# Patient Record
Sex: Female | Born: 1950 | Race: White | Hispanic: No | Marital: Married | Smoking: Never smoker
Health system: Southern US, Community
[De-identification: ages and names within clinical notes are randomized; demographics above are authoritative.]

## PROBLEM LIST (undated history)

## (undated) DIAGNOSIS — I10 Essential (primary) hypertension: Secondary | ICD-10-CM

## (undated) DIAGNOSIS — I4819 Other persistent atrial fibrillation: Secondary | ICD-10-CM

## (undated) DIAGNOSIS — F410 Panic disorder [episodic paroxysmal anxiety] without agoraphobia: Secondary | ICD-10-CM

## (undated) DIAGNOSIS — T4145XA Adverse effect of unspecified anesthetic, initial encounter: Secondary | ICD-10-CM

## (undated) DIAGNOSIS — G43909 Migraine, unspecified, not intractable, without status migrainosus: Secondary | ICD-10-CM

## (undated) DIAGNOSIS — T8859XA Other complications of anesthesia, initial encounter: Secondary | ICD-10-CM

## (undated) DIAGNOSIS — Z8489 Family history of other specified conditions: Secondary | ICD-10-CM

## (undated) DIAGNOSIS — F419 Anxiety disorder, unspecified: Secondary | ICD-10-CM

## (undated) DIAGNOSIS — R0683 Snoring: Secondary | ICD-10-CM

## (undated) DIAGNOSIS — M1990 Unspecified osteoarthritis, unspecified site: Secondary | ICD-10-CM

## (undated) DIAGNOSIS — E785 Hyperlipidemia, unspecified: Secondary | ICD-10-CM

## (undated) DIAGNOSIS — I517 Cardiomegaly: Secondary | ICD-10-CM

## (undated) DIAGNOSIS — E0590 Thyrotoxicosis, unspecified without thyrotoxic crisis or storm: Secondary | ICD-10-CM

## (undated) DIAGNOSIS — T753XXA Motion sickness, initial encounter: Secondary | ICD-10-CM

## (undated) DIAGNOSIS — E669 Obesity, unspecified: Secondary | ICD-10-CM

## (undated) DIAGNOSIS — E039 Hypothyroidism, unspecified: Secondary | ICD-10-CM

## (undated) HISTORY — DX: Other persistent atrial fibrillation: I48.19

## (undated) HISTORY — DX: Hyperlipidemia, unspecified: E78.5

## (undated) HISTORY — DX: Obesity, unspecified: E66.9

## (undated) HISTORY — DX: Cardiomegaly: I51.7

## (undated) HISTORY — PX: ABDOMINAL HYSTERECTOMY: SHX81

## (undated) HISTORY — DX: Anxiety disorder, unspecified: F41.9

## (undated) HISTORY — PX: KNEE SURGERY: SHX244

## (undated) HISTORY — DX: Essential (primary) hypertension: I10

## (undated) HISTORY — PX: CATARACT EXTRACTION: SUR2

## (undated) HISTORY — DX: Snoring: R06.83

## (undated) HISTORY — DX: Thyrotoxicosis, unspecified without thyrotoxic crisis or storm: E05.90

## (undated) HISTORY — DX: Panic disorder (episodic paroxysmal anxiety): F41.0

---

## 2004-02-23 LAB — HM COLONOSCOPY: HM Colonoscopy: NORMAL

## 2004-12-14 ENCOUNTER — Ambulatory Visit: Payer: Self-pay | Admitting: Internal Medicine

## 2006-04-22 ENCOUNTER — Other Ambulatory Visit: Payer: Self-pay

## 2006-04-22 ENCOUNTER — Inpatient Hospital Stay: Payer: Self-pay | Admitting: Internal Medicine

## 2006-04-23 ENCOUNTER — Encounter: Payer: Self-pay | Admitting: Internal Medicine

## 2006-04-27 ENCOUNTER — Ambulatory Visit: Payer: Self-pay | Admitting: Internal Medicine

## 2006-08-18 ENCOUNTER — Ambulatory Visit: Payer: Self-pay | Admitting: Unknown Physician Specialty

## 2006-09-30 ENCOUNTER — Ambulatory Visit: Payer: Self-pay | Admitting: Surgery

## 2007-01-26 ENCOUNTER — Ambulatory Visit: Payer: Self-pay | Admitting: Family Medicine

## 2007-11-15 ENCOUNTER — Ambulatory Visit: Payer: Self-pay | Admitting: Orthopedic Surgery

## 2007-12-28 ENCOUNTER — Ambulatory Visit: Payer: Self-pay | Admitting: Orthopedic Surgery

## 2008-01-05 ENCOUNTER — Ambulatory Visit: Payer: Self-pay | Admitting: Orthopedic Surgery

## 2008-02-04 HISTORY — PX: OTHER SURGICAL HISTORY: SHX169

## 2008-03-06 DEATH — deceased

## 2008-06-15 ENCOUNTER — Ambulatory Visit: Payer: Self-pay | Admitting: Family Medicine

## 2008-08-16 ENCOUNTER — Emergency Department: Payer: Self-pay | Admitting: Emergency Medicine

## 2008-08-23 ENCOUNTER — Ambulatory Visit: Payer: Self-pay | Admitting: Unknown Physician Specialty

## 2008-08-25 ENCOUNTER — Ambulatory Visit: Payer: Self-pay | Admitting: Unknown Physician Specialty

## 2009-06-19 ENCOUNTER — Ambulatory Visit: Payer: Self-pay | Admitting: Internal Medicine

## 2009-10-05 ENCOUNTER — Encounter: Payer: Self-pay | Admitting: Internal Medicine

## 2009-10-10 ENCOUNTER — Encounter: Payer: Self-pay | Admitting: Internal Medicine

## 2009-11-13 ENCOUNTER — Encounter: Payer: Self-pay | Admitting: Internal Medicine

## 2009-11-13 ENCOUNTER — Ambulatory Visit: Payer: Self-pay | Admitting: Ophthalmology

## 2009-11-26 ENCOUNTER — Ambulatory Visit: Payer: Self-pay | Admitting: Ophthalmology

## 2009-12-28 ENCOUNTER — Ambulatory Visit: Payer: Self-pay | Admitting: Internal Medicine

## 2009-12-28 ENCOUNTER — Encounter: Payer: Self-pay | Admitting: Internal Medicine

## 2009-12-28 DIAGNOSIS — R002 Palpitations: Secondary | ICD-10-CM | POA: Insufficient documentation

## 2010-01-08 ENCOUNTER — Ambulatory Visit: Payer: Self-pay

## 2010-01-08 ENCOUNTER — Encounter: Payer: Self-pay | Admitting: Internal Medicine

## 2010-03-05 NOTE — Letter (Signed)
SummaryScientist, [facility]   [facility]   Imported By: Roderic Ovens 01/09/2010 10:45:40  _____________________________________________________________________  External Attachment:    Type:   Image     Comment:   External Document

## 2010-03-05 NOTE — Assessment & Plan Note (Signed)
Summary: NEW PT   Visit Type:  Initial Consult Primary Provider:  Carmine Savoy.  CC:  c/o palpitations..  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 S/p thyroid ablation in 2010. Referred by Dr. Dan Humphreys for further evaluation of palpitations.   Was admitted to [facility] for palpitations in 2008 and found to have atrial fibrillation. Lasted about 30 minutes and resolved. Echo and Myoview normal. 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  Started on methimazole. Underwent I-131 thyroid ablation in 2010.   Says she has always been very anxious. Past few months has been very stressful for various reasons. 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 On Saturday has some palpitations - able to go to bed. Monday, had a stressful day (had bird stuck in house which terriefied her) then was sitting at dinner table had sudden onset of palpitations. Tried to stand up and felt dizzy. Went to see her son-in-law at Urgent Care and it was felt that she was in AF (irregular) pulse was 95. No ECG done. Symptoms lasted about 30 mins. She is not sure if this was recurrent AF or a panic attacks.      Doesn't drink much caffeine. Sleeps well at night. Husband tells her she snores sometimes. No witnessed apnea. Feels fatigues but thinks it is related more to depression. Never tested for OSA. Thyroid last tested in 9/11 and was fine.   Preventive Screening-Counseling & Management  Caffeine-Diet-Exercise     Does Patient Exercise: yes  Problems Prior to Update: None  Medications Prior to Update: 1)  Lovastatin 20 Mg Tabs (Lovastatin) .Marland Kitchen.. 1 Tablet Once Daily 2)  Lipitor 20 Mg Tabs (Atorvastatin Calcium) .Marland Kitchen.. 1 Tablet Once Daily 3)  Aleve 220 Mg Tabs (Naproxen Sodium) .... 2 Tablets Once Daily 4)  Metoprolol Succinate 100 Mg Xr24h-Tab (Metoprolol Succinate) .Marland Kitchen.. 1 Tablet Once Daily 5)   Sertraline Hcl 100 Mg Tabs (Sertraline Hcl) .Marland Kitchen.. 1 Tablet Once Daily 6)  Hydrochlorothiazide 25 Mg Tabs (Hydrochlorothiazide) .Marland Kitchen.. 1 Tablet Once Daily  Current Medications (verified): 1)  Lipitor 20 Mg Tabs (Atorvastatin Calcium) .Marland Kitchen.. 1 Tablet Once Daily 2)  Aleve 220 Mg Tabs (Naproxen Sodium) .... 2 Tablets Once Daily 3)  Metoprolol Succinate 100 Mg Xr24h-Tab (Metoprolol Succinate) .Marland Kitchen.. 1 Tablet Once Daily 4)  Sertraline Hcl 100 Mg Tabs (Sertraline Hcl) .Marland Kitchen.. 1 Tablet Once Daily 5)  Hydrochlorothiazide 25 Mg Tabs (Hydrochlorothiazide) .Marland Kitchen.. 1 Tablet Once Daily 6)  Zantac 150 Mg Tabs (Ranitidine Hcl) .... As Needed  Allergies (verified): 1)  ! Sulfa  Past History:  Family History: Last updated: 01/15/10 Father: Deceased age 40; Alzheimer's Mother: Deceased age 28; CVA   Four sisters living; one has A-Fib/ s/p ablation at age 96.  Social History: Last updated: 01-15-10 Tobacco Use - No.  Alcohol Use - no Married  Regular Exercise - yes--2 miles 3-x weekly  Risk Factors: Exercise: yes (Jan 15, 2010)  Risk Factors: Smoking Status: never (12/26/2009)  Past Medical History: Hypertension Hyperlipidemia Anxiety palpitations Hx. of A-fib due hyperthyroidism;  s/p thyroid ablation 2010  Past Surgical History: Cataract Extraction Thyroid ablation 2010  Family History: Reviewed history and no changes required. Father: Deceased age 21; Alzheimer's Mother: Deceased age 6; CVA   Four sisters living; one has A-Fib/ s/p ablation at age 57.  Social History: Reviewed history from 12/26/2009 and no changes required. Tobacco Use - No.  Alcohol  Use - no Married  Regular Exercise - yes--2 miles 3-x weekly Does Patient Exercise:  yes  Review of Systems       As per HPI and past medical history; otherwise all systems negative.   Vital Signs:  Patient profile:   60 year old female Height:      63 inches Weight:      224.75 pounds BMI:     39.96 BP sitting:   156 /  84  (left arm) Cuff size:   large  Vitals Entered By: Bonnie Shaw, Bonnie Shaw (December 28, 2009 12:01 PM)  Physical Exam  General:  Anxious with pressured speech. Tremulous. no acute ditress. no resp difficulty HEENT: normal Neck: supple. no JVD. Carotids 2+ bilat; no bruits. No lymphadenopathy or thryomegaly appreciated. Cor: PMI nondisplaced. Regular rate & rhythm. No rubs, gallops, murmur. Lungs: clear Abdomen: obese. soft, nontender, nondistended. No hepatosplenomegaly. No bruits or masses. Good bowel sounds. Extremities: no cyanosis, clubbing, rash, edema Neuro: alert & orientedx3, cranial nerves grossly intact. moves all 4 extremities w/o difficulty. affect pleasant    Impression & Recommendations:  Problem # 1:  PALPITATIONS (ICD-785.1) We will need to figure out if this is recurrent AF versus frequent PAC/PVCs. Either way I suspect her stress and anxiety is playing a major role and I have recommended that she f/u with Dr. Dan Humphreys and/or a counselor with regard to controlling her anxiety level. We will place a 2-week event monitor and check repeat echo. Unable to titrate b-blocker any further now due to resting bradycardia. If she does have confirmed AF we will need to consider coumadin. Wil continue ECASA 325 once daily for now.  We discussed possibility of OSA as contributor to her palpitations but she does not feel like her sleep patterns are consistent with this so we will hold off on sleep study for now.   Other Orders: Echocardiogram (Echo) Holter Monitor (Holter Monitor)  Patient Instructions: 1)  Your physician recommends that you schedule a follow-up appointment in: 3 months. 2)  Your physician has requested that you have an echocardiogram.  Echocardiography is a painless test that uses sound waves to create images of your heart. It provides your doctor with information about the size and shape of your heart and how well your heart's chambers and valves are working.  This  procedure takes approximately one hour. There are no restrictions for this procedure. 3)  Your physician has recommended that you wear an event monitor.  Event monitors are medical devices that record the heart's electrical activity. Doctors most often use these monitors to diagnose arrhythmias. Arrhythmias are problems with the speed or rhythm of the heartbeat. The monitor is a small, portable device. You can wear one while you do your normal daily activities. This is usually used to diagnose what is causing palpitations/syncope (passing out).

## 2010-03-05 NOTE — Letter (Signed)
SummarySecondary school teacher Office Note   [facility] Office Note   Imported By: Roderic Ovens 01/09/2010 15:38:27  _____________________________________________________________________  External Attachment:    Type:   Image     Comment:   External Document

## 2010-04-09 ENCOUNTER — Ambulatory Visit: Payer: Self-pay | Admitting: Internal Medicine

## 2010-06-19 ENCOUNTER — Ambulatory Visit: Payer: Self-pay | Admitting: Orthopedic Surgery

## 2010-06-21 ENCOUNTER — Ambulatory Visit: Payer: Self-pay | Admitting: Internal Medicine

## 2010-07-11 ENCOUNTER — Ambulatory Visit: Payer: Self-pay | Admitting: Orthopedic Surgery

## 2010-07-18 ENCOUNTER — Inpatient Hospital Stay: Payer: Self-pay | Admitting: Orthopedic Surgery

## 2010-09-26 ENCOUNTER — Telehealth: Payer: Self-pay | Admitting: Internal Medicine

## 2010-09-27 NOTE — Telephone Encounter (Signed)
I would have no way of knowing without looking at her old records. Please request last note from [facility].

## 2010-09-27 NOTE — Telephone Encounter (Signed)
I Called

## 2010-10-01 ENCOUNTER — Telehealth: Payer: Self-pay | Admitting: Internal Medicine

## 2010-10-01 DIAGNOSIS — E039 Hypothyroidism, unspecified: Secondary | ICD-10-CM

## 2010-10-01 MED ORDER — LEVOTHYROXINE SODIUM 50 MCG PO TABS: 50.0000 ug | ORAL_TABLET | Freq: Every day | ORAL | Status: DC

## 2010-10-01 NOTE — Telephone Encounter (Signed)
We can call pharmacy and refill for one month. We need to get her records.

## 2010-10-01 NOTE — Telephone Encounter (Signed)
Patient called and requested refill on Synthroid and stated we gave her samples and she has been on this for a month.  I have no record of her taking this.  Will you refill?

## 2010-10-02 NOTE — Telephone Encounter (Signed)
Can you please make sure this has been taken care of? Pt has requested synthroid, but we needed dose from her or her pharmacy, as records not available. Thanks

## 2010-10-03 NOTE — Telephone Encounter (Signed)
Rx for synthroid was sent to patient's pharmacy on 8/28.

## 2010-10-08 ENCOUNTER — Encounter: Payer: Self-pay | Admitting: Internal Medicine

## 2010-10-08 ENCOUNTER — Other Ambulatory Visit (INDEPENDENT_AMBULATORY_CARE_PROVIDER_SITE_OTHER): Payer: 59 | Admitting: *Deleted

## 2010-10-08 DIAGNOSIS — Z0000 Encounter for general adult medical examination without abnormal findings: Secondary | ICD-10-CM

## 2010-10-08 LAB — TSH: TSH: 0.61 u[IU]/mL (ref 0.35–5.50)

## 2010-10-28 ENCOUNTER — Other Ambulatory Visit: Payer: Self-pay | Admitting: *Deleted

## 2010-10-28 DIAGNOSIS — E039 Hypothyroidism, unspecified: Secondary | ICD-10-CM

## 2010-10-28 MED ORDER — LEVOTHYROXINE SODIUM 50 MCG PO TABS: 50.0000 ug | ORAL_TABLET | Freq: Every day | ORAL | Status: DC

## 2010-11-27 ENCOUNTER — Other Ambulatory Visit: Payer: Self-pay | Admitting: Internal Medicine

## 2011-02-20 ENCOUNTER — Ambulatory Visit (INDEPENDENT_AMBULATORY_CARE_PROVIDER_SITE_OTHER): Payer: 59 | Admitting: Internal Medicine

## 2011-02-20 ENCOUNTER — Encounter: Payer: Self-pay | Admitting: Internal Medicine

## 2011-02-20 DIAGNOSIS — E669 Obesity, unspecified: Secondary | ICD-10-CM

## 2011-02-20 DIAGNOSIS — E785 Hyperlipidemia, unspecified: Secondary | ICD-10-CM

## 2011-02-20 DIAGNOSIS — N750 Cyst of Bartholin's gland: Secondary | ICD-10-CM

## 2011-02-20 DIAGNOSIS — M5430 Sciatica, unspecified side: Secondary | ICD-10-CM

## 2011-02-20 DIAGNOSIS — E039 Hypothyroidism, unspecified: Secondary | ICD-10-CM

## 2011-02-20 DIAGNOSIS — M5441 Lumbago with sciatica, right side: Secondary | ICD-10-CM | POA: Insufficient documentation

## 2011-02-20 DIAGNOSIS — I10 Essential (primary) hypertension: Secondary | ICD-10-CM

## 2011-02-20 LAB — COMPREHENSIVE METABOLIC PANEL
Albumin: 4.2 g/dL (ref 3.5–5.2)
BUN: 15 mg/dL (ref 6–23)
CO2: 26 mEq/L (ref 19–32)
Calcium: 9.3 mg/dL (ref 8.4–10.5)
Chloride: 102 mEq/L (ref 96–112)
Creatinine, Ser: 0.7 mg/dL (ref 0.4–1.2)
GFR: 93.37 mL/min (ref >60.00)
Glucose, Bld: 106 mg/dL — ABNORMAL HIGH (ref 70–99)

## 2011-02-20 LAB — CBC WITH DIFFERENTIAL/PLATELET
Basophils Absolute: 0 10*3/uL (ref 0.0–0.1)
Basophils Relative: 0.4 % (ref 0.0–3.0)
Hemoglobin: 14.7 g/dL (ref 12.0–15.0)
Lymphocytes Relative: 32.2 % (ref 12.0–46.0)
Monocytes Relative: 8.6 % (ref 3.0–12.0)
Neutro Abs: 5.1 10*3/uL (ref 1.4–7.7)
Neutrophils Relative %: 56.7 % (ref 43.0–77.0)
RBC: 4.56 Mil/uL (ref 3.87–5.11)

## 2011-02-20 LAB — LDL CHOLESTEROL, DIRECT: Direct LDL: 111 mg/dL

## 2011-02-20 LAB — LIPID PANEL
HDL: 48 mg/dL (ref >39.00)
Triglycerides: 214 mg/dL — ABNORMAL HIGH (ref 0.0–149.0)
VLDL: 42.8 mg/dL — ABNORMAL HIGH (ref 0.0–40.0)

## 2011-02-20 MED ORDER — METOPROLOL SUCCINATE ER 100 MG PO TB24: 100.0000 mg | ORAL_TABLET | Freq: Every day | ORAL | Status: DC

## 2011-02-20 MED ORDER — ATORVASTATIN CALCIUM 20 MG PO TABS: 20.0000 mg | ORAL_TABLET | Freq: Every day | ORAL | Status: DC

## 2011-02-20 MED ORDER — HYDROCHLOROTHIAZIDE 25 MG PO TABS: 25.0000 mg | ORAL_TABLET | Freq: Every day | ORAL | Status: DC

## 2011-02-20 MED ORDER — LEVOTHYROXINE SODIUM 50 MCG PO TABS: 50.0000 ug | ORAL_TABLET | Freq: Every day | ORAL | Status: DC

## 2011-02-20 NOTE — Progress Notes (Signed)
Subjective:    Patient ID: Bonnie Shaw, female    DOB: January 17, 1951, 61 y.o.   MRN: [id number]  HPI 60YO female with h/o HTN, HL, hypothyroidism presents for follow up. In regards to hypothyroidism, she notes some fatigue. She would like to check TSH today to see if synthroid dose adequate.   In regards to hypertension and hyperlipidemia, she reports full compliance with her medication. She did not bring a record of her blood pressure today. She denies any chest pain, palpitations, headache. She denies any noted side effects from her medicines.  She is also concerned today about worsening lower back pain. She notes that she occasionally has exacerbation of her lower back pain in association with certain movements. She has been followed by a chiropractor for this and has been undergoing therapy with minimal improvement. She notes that the pain starts in her lower back and then radiates down her right posterior thigh. She has been taking ibuprofen with some improvement. She is not yet interested in pursuing additional evaluation such as MRI or pursuing physical therapy.   She also notes a recent finding of a small nodule to the left side of the vaginal introitus. She denies any trauma to this area. She denies any pain at the site. She denies any vaginal discharge. She denies any tenderness of the nodule. She reports that it has not changed in size. She denies any fever or chills. She is status post hysterectomy with removal of her cervix.  Outpatient Encounter Prescriptions as of 02/20/2011  Medication Sig Dispense Refill  . aspirin 325 MG EC tablet Take 325 mg by mouth daily.        Marland Kitchen atorvastatin (LIPITOR) 20 MG tablet Take 1 tablet (20 mg total) by mouth daily.  90 tablet  1  . hydrochlorothiazide (HYDRODIURIL) 25 MG tablet Take 1 tablet (25 mg total) by mouth daily.  90 tablet  1  . Kelp 100 MG TABS Take by mouth daily.      Marland Kitchen levothyroxine (SYNTHROID, LEVOTHROID) 50 MCG tablet Take 1 tablet (50  mcg total) by mouth daily.  90 tablet  1  . Lutein 10 MG TABS Take by mouth daily.      . metoprolol succinate (TOPROL-XL) 100 MG 24 hr tablet Take 1 tablet (100 mg total) by mouth daily.  90 tablet  1  . naproxen sodium (ANAPROX) 220 MG tablet Take 220 mg by mouth 2 (two) times daily with a meal.        . ranitidine (ZANTAC) 150 MG tablet Take 150 mg by mouth 2 (two) times daily as needed.        . sertraline (ZOLOFT) 100 MG tablet Take 100 mg by mouth daily.          Review of Systems  Constitutional: Negative for fever, chills, appetite change, fatigue and unexpected weight change.  HENT: Negative for ear pain, congestion, sore throat, trouble swallowing, neck pain, voice change and sinus pressure.   Eyes: Negative for visual disturbance.  Respiratory: Negative for cough, shortness of breath, wheezing and stridor.   Cardiovascular: Negative for chest pain, palpitations and leg swelling.  Gastrointestinal: Negative for nausea, vomiting, abdominal pain, diarrhea, constipation, blood in stool, abdominal distention and anal bleeding.  Genitourinary: Positive for genital sores (small nodule to left of introitus). Negative for dysuria, flank pain, vaginal bleeding, vaginal discharge and vaginal pain.  Musculoskeletal: Positive for myalgias, back pain and arthralgias. Negative for gait problem.  Skin: Negative for color change and rash.  Neurological: Negative for dizziness and headaches.  Hematological: Negative for adenopathy. Does not bruise/bleed easily.  Psychiatric/Behavioral: Negative for suicidal ideas, sleep disturbance and dysphoric mood. The patient is not nervous/anxious.    BP 136/82  Pulse 60  Temp(Src) 98 F (36.7 C) (Oral)  Wt 230 lb (104.327 kg)  SpO2 95%     Objective:   Physical Exam  Constitutional: She is oriented to person, place, and time. She appears well-developed and well-nourished. No distress.  HENT:  Head: Normocephalic and atraumatic.  Right Ear: External  ear normal.  Left Ear: External ear normal.  Nose: Nose normal.  Mouth/Throat: Oropharynx is clear and moist. No oropharyngeal exudate.  Eyes: Conjunctivae are normal. Pupils are equal, round, and reactive to light. Right eye exhibits no discharge. Left eye exhibits no discharge. No scleral icterus.  Neck: Normal range of motion. Neck supple. No tracheal deviation present. No thyromegaly present.  Cardiovascular: Normal rate, regular rhythm, normal heart sounds and intact distal pulses.  Exam reveals no gallop and no friction rub.   No murmur heard. Pulmonary/Chest: Effort normal and breath sounds normal. No respiratory distress. She has no wheezes. She has no rales. She exhibits no tenderness.  Genitourinary:     Musculoskeletal: She exhibits no edema and no tenderness.       Lumbar back: She exhibits decreased range of motion, tenderness and pain.  Lymphadenopathy:    She has no cervical adenopathy.  Neurological: She is alert and oriented to person, place, and time. No cranial nerve deficit. She exhibits normal muscle tone. Coordination normal.  Skin: Skin is warm and dry. No rash noted. She is not diaphoretic. No erythema. No pallor.  Psychiatric: She has a normal mood and affect. Her behavior is normal. Judgment and thought content normal.          Assessment & Plan:

## 2011-02-20 NOTE — Assessment & Plan Note (Signed)
BP fairly well controlled on current meds. Will check renal function with labs today. Follow up 6 months.

## 2011-02-20 NOTE — Assessment & Plan Note (Signed)
Patient noted to have BMI of 40. She has made efforts in the past to improve diet and increase physical activity. Her physical activity is currently limited by lower back pain. We'll continue to address her lower back pain. We'll also check TSH with labs today given her history of hypothyroidism to ensure that this is not contributing to difficulty losing weight. She will followup in 3 months.

## 2011-02-20 NOTE — Assessment & Plan Note (Signed)
Small, nodular area to left of introitus most consistent with Bartholin gland cyst. No overlying induration to suggest infection. Will monitor for now. Pt will call if area enlarges, becomes painful, or does not resolve over next month.

## 2011-02-20 NOTE — Assessment & Plan Note (Signed)
Will check lipids and LFTs with labs today. Continue statin.  Goal LDL <100.

## 2011-02-20 NOTE — Assessment & Plan Note (Signed)
Pt symptomatic with fatigue. Will check TSH with labs today. Continue synthroid. Follow up 6 months.

## 2011-02-20 NOTE — Assessment & Plan Note (Signed)
Pt currently under care of chiropractor. Discussed option of MRI lumbar spine and evaluation by orthopedics as well as PT.  Will continue to monitor for now. Pt will call if symptoms worsening. She will use ibuprofen prn pain.

## 2011-05-22 ENCOUNTER — Encounter: Payer: Self-pay | Admitting: Internal Medicine

## 2011-06-04 ENCOUNTER — Telehealth: Payer: Self-pay | Admitting: Internal Medicine

## 2011-06-04 DIAGNOSIS — Z1239 Encounter for other screening for malignant neoplasm of breast: Secondary | ICD-10-CM

## 2011-06-04 NOTE — Telephone Encounter (Signed)
[phone] [place] sent letter time for mammogram. Pt would like early morning [facility] Monday wed thursday

## 2011-06-04 NOTE — Telephone Encounter (Signed)
Order placed for scheduling.

## 2011-06-25 ENCOUNTER — Ambulatory Visit: Payer: Self-pay | Admitting: Internal Medicine

## 2011-07-02 ENCOUNTER — Encounter: Payer: Self-pay | Admitting: Internal Medicine

## 2011-08-22 ENCOUNTER — Ambulatory Visit (INDEPENDENT_AMBULATORY_CARE_PROVIDER_SITE_OTHER): Payer: 59 | Admitting: Internal Medicine

## 2011-08-22 ENCOUNTER — Encounter: Payer: Self-pay | Admitting: Internal Medicine

## 2011-08-22 VITALS — BP 132/82 | HR 55 | Temp 98.7°F | Ht 63.0 in | Wt 231.5 lb

## 2011-08-22 DIAGNOSIS — R5383 Other fatigue: Secondary | ICD-10-CM

## 2011-08-22 DIAGNOSIS — E785 Hyperlipidemia, unspecified: Secondary | ICD-10-CM

## 2011-08-22 DIAGNOSIS — R531 Weakness: Secondary | ICD-10-CM | POA: Insufficient documentation

## 2011-08-22 DIAGNOSIS — R350 Frequency of micturition: Secondary | ICD-10-CM

## 2011-08-22 DIAGNOSIS — E039 Hypothyroidism, unspecified: Secondary | ICD-10-CM

## 2011-08-22 DIAGNOSIS — R5381 Other malaise: Secondary | ICD-10-CM

## 2011-08-22 DIAGNOSIS — I10 Essential (primary) hypertension: Secondary | ICD-10-CM

## 2011-08-22 LAB — POCT URINALYSIS DIPSTICK
Blood, UA: NEGATIVE
Glucose, UA: NEGATIVE
Nitrite, UA: NEGATIVE
Spec Grav, UA: 1.02

## 2011-08-22 LAB — COMPREHENSIVE METABOLIC PANEL
Albumin: 4.3 g/dL (ref 3.5–5.2)
BUN: 13 mg/dL (ref 6–23)
Calcium: 9.4 mg/dL (ref 8.4–10.5)
Chloride: 104 mEq/L (ref 96–112)
Glucose, Bld: 117 mg/dL — ABNORMAL HIGH (ref 70–99)
Potassium: 3.8 mEq/L (ref 3.5–5.1)

## 2011-08-22 LAB — LIPID PANEL
HDL: 52.2 mg/dL (ref >39.00)
Triglycerides: 192 mg/dL — ABNORMAL HIGH (ref 0.0–149.0)

## 2011-08-22 LAB — CBC WITH DIFFERENTIAL/PLATELET
Basophils Relative: 0.5 % (ref 0.0–3.0)
Eosinophils Relative: 2.1 % (ref 0.0–5.0)
Hemoglobin: 14.8 g/dL (ref 12.0–15.0)
Lymphocytes Relative: 34.1 % (ref 12.0–46.0)
MCV: 92.9 fl (ref 78.0–100.0)
Neutrophils Relative %: 54.4 % (ref 43.0–77.0)

## 2011-08-22 LAB — TSH: TSH: 1.05 u[IU]/mL (ref 0.35–5.50)

## 2011-08-22 LAB — MICROALBUMIN / CREATININE URINE RATIO
Creatinine,U: 317.9 mg/dL
Microalb Creat Ratio: 0.4 mg/g (ref 0.0–30.0)

## 2011-08-22 NOTE — Assessment & Plan Note (Signed)
Will check LFTs and lipids with labs today. Continue atorvastatin.

## 2011-08-22 NOTE — Progress Notes (Signed)
Subjective:    Patient ID: Bonnie Shaw, female    DOB: 1950-05-19, 61 y.o.   MRN: [id number]  HPI 61 year old female with history of hypertension, hypothyroidism, hyperlipidemia presents for followup. She reports she is generally been doing well. She notes she has been very busy and she recently released a new book. She continues to have some intermittent episodes of fatigue. She denies focal symptoms such as change in bowel habits, change in weight, chest pain, shortness of breath. She had also complained of fatigue at her previous visit 6 months ago and lab work including CBC, TSH, CMP was normal. Notably, she has never had a sleep study. She is not sure she snores at night. She reports that she generally sleeps well and feels well rested when she wakes. She is not interested in having a sleep study.  In regards to her chronic conditions, she reports full compliance with her medications. She denies any noted side effects from her medicines. She notes that blood pressure has been well-controlled typically 120-130/80.  Outpatient Encounter Prescriptions as of 08/22/2011  Medication Sig Dispense Refill  . aspirin 325 MG EC tablet Take 325 mg by mouth daily.        Marland Kitchen atorvastatin (LIPITOR) 20 MG tablet Take 1 tablet (20 mg total) by mouth daily.  90 tablet  1  . hydrochlorothiazide (HYDRODIURIL) 25 MG tablet Take 1 tablet (25 mg total) by mouth daily.  90 tablet  1  . Kelp 100 MG TABS Take by mouth daily.      Marland Kitchen levothyroxine (SYNTHROID, LEVOTHROID) 50 MCG tablet Take 1 tablet (50 mcg total) by mouth daily.  90 tablet  1  . Lutein 10 MG TABS Take by mouth daily.      . metoprolol succinate (TOPROL-XL) 100 MG 24 hr tablet Take 1 tablet (100 mg total) by mouth daily.  90 tablet  1  . naproxen sodium (ANAPROX) 220 MG tablet Take 220 mg by mouth 2 (two) times daily with a meal.        . ranitidine (ZANTAC) 150 MG tablet Take 150 mg by mouth 2 (two) times daily as needed.        . sertraline (ZOLOFT)  100 MG tablet Take 100 mg by mouth daily.         BP 144/100  Pulse 55  Temp 98.7 F (37.1 C) (Oral)  Ht 5\' 3"  (1.6 m)  Wt 231 lb 8 oz (105.008 kg)  BMI 41.01 kg/m2  SpO2 98% Review of Systems  Constitutional: Positive for fatigue. Negative for fever, chills, appetite change and unexpected weight change.  HENT: Negative for ear pain, congestion, sore throat, trouble swallowing, neck pain, voice change and sinus pressure.   Eyes: Negative for visual disturbance.  Respiratory: Negative for cough, shortness of breath, wheezing and stridor.   Cardiovascular: Negative for chest pain, palpitations and leg swelling.  Gastrointestinal: Negative for nausea, vomiting, abdominal pain, diarrhea, constipation, blood in stool, abdominal distention and anal bleeding.  Genitourinary: Negative for dysuria and flank pain.  Musculoskeletal: Negative for myalgias, arthralgias and gait problem.  Skin: Negative for color change and rash.  Neurological: Negative for dizziness and headaches.  Hematological: Negative for adenopathy. Does not bruise/bleed easily.  Psychiatric/Behavioral: Negative for suicidal ideas, disturbed wake/sleep cycle and dysphoric mood. The patient is not nervous/anxious.        Objective:   Physical Exam  Constitutional: She is oriented to person, place, and time. She appears well-developed and well-nourished. No distress.  HENT:  Head: Normocephalic and atraumatic.  Right Ear: External ear normal.  Left Ear: External ear normal.  Nose: Nose normal.  Mouth/Throat: Oropharynx is clear and moist. No oropharyngeal exudate.  Eyes: Conjunctivae are normal. Pupils are equal, round, and reactive to light. Right eye exhibits no discharge. Left eye exhibits no discharge. No scleral icterus.  Neck: Normal range of motion. Neck supple. No tracheal deviation present. No thyromegaly present.  Cardiovascular: Normal rate, regular rhythm, normal heart sounds and intact distal pulses.  Exam  reveals no gallop and no friction rub.   No murmur heard. Pulmonary/Chest: Effort normal and breath sounds normal. No respiratory distress. She has no wheezes. She has no rales. She exhibits no tenderness.  Musculoskeletal: Normal range of motion. She exhibits no edema and no tenderness.  Lymphadenopathy:    She has no cervical adenopathy.  Neurological: She is alert and oriented to person, place, and time. No cranial nerve deficit. She exhibits normal muscle tone. Coordination normal.  Skin: Skin is warm and dry. No rash noted. She is not diaphoretic. No erythema. No pallor.  Psychiatric: She has a normal mood and affect. Her behavior is normal. Judgment and thought content normal.          Assessment & Plan:

## 2011-08-22 NOTE — Assessment & Plan Note (Signed)
Will recheck lipids and LFTs with labs today. Continue atorvastatin.

## 2011-08-22 NOTE — Assessment & Plan Note (Signed)
Blood pressure initially elevated then improved on repeat. Will plan to continue current medications. Will check renal function with labs today.

## 2011-08-22 NOTE — Assessment & Plan Note (Signed)
Will check TSH with labs today. Continue Synthroid. 

## 2011-08-22 NOTE — Assessment & Plan Note (Signed)
Patient with some intermittent urinary frequency. Will get urinalysis with labs today.

## 2011-08-22 NOTE — Assessment & Plan Note (Signed)
Persistent symptoms of fatigue. Last lab work in January 2013 normal. Will recheck thyroid, CBC, and CMP with labs. We also discussed the possibility of setting up a sleep study to evaluate for sleep apnea, however patient would like to defer this for now.

## 2011-09-03 ENCOUNTER — Other Ambulatory Visit: Payer: Self-pay | Admitting: *Deleted

## 2011-09-03 DIAGNOSIS — E785 Hyperlipidemia, unspecified: Secondary | ICD-10-CM

## 2011-09-03 MED ORDER — ATORVASTATIN CALCIUM 20 MG PO TABS: 20.0000 mg | ORAL_TABLET | Freq: Every day | ORAL | Status: DC

## 2011-09-03 MED ORDER — SERTRALINE HCL 100 MG PO TABS: 100.0000 mg | ORAL_TABLET | Freq: Every day | ORAL | Status: DC

## 2011-09-23 ENCOUNTER — Other Ambulatory Visit: Payer: Self-pay | Admitting: *Deleted

## 2011-09-23 DIAGNOSIS — I10 Essential (primary) hypertension: Secondary | ICD-10-CM

## 2011-09-23 MED ORDER — HYDROCHLOROTHIAZIDE 25 MG PO TABS: 25.0000 mg | ORAL_TABLET | Freq: Every day | ORAL | Status: DC

## 2011-09-30 ENCOUNTER — Other Ambulatory Visit: Payer: Self-pay | Admitting: *Deleted

## 2011-09-30 DIAGNOSIS — I10 Essential (primary) hypertension: Secondary | ICD-10-CM

## 2011-09-30 MED ORDER — METOPROLOL SUCCINATE ER 100 MG PO TB24: 100.0000 mg | ORAL_TABLET | Freq: Every day | ORAL | Status: DC

## 2011-10-27 ENCOUNTER — Other Ambulatory Visit: Payer: Self-pay | Admitting: Internal Medicine

## 2011-10-27 ENCOUNTER — Telehealth: Payer: Self-pay | Admitting: Internal Medicine

## 2011-10-27 DIAGNOSIS — E039 Hypothyroidism, unspecified: Secondary | ICD-10-CM

## 2011-10-27 MED ORDER — LEVOTHYROXINE SODIUM 50 MCG PO TABS: 50.0000 ug | ORAL_TABLET | Freq: Every day | ORAL | Status: DC

## 2011-10-27 NOTE — Telephone Encounter (Signed)
Pt is needing refill on Synthyroid. She's uses [facility].

## 2011-10-27 NOTE — Telephone Encounter (Signed)
Levothyroxine (synthroid, Levothroid) 50 mcg tablet  Take 1 tablet by mouth daily Qty. 90

## 2011-12-18 ENCOUNTER — Ambulatory Visit: Payer: Self-pay | Admitting: Ophthalmology

## 2011-12-29 ENCOUNTER — Ambulatory Visit: Payer: Self-pay | Admitting: Ophthalmology

## 2012-02-23 ENCOUNTER — Encounter: Payer: Self-pay | Admitting: Internal Medicine

## 2012-02-23 ENCOUNTER — Ambulatory Visit (INDEPENDENT_AMBULATORY_CARE_PROVIDER_SITE_OTHER): Payer: 59 | Admitting: Internal Medicine

## 2012-02-23 VITALS — BP 124/80 | HR 60 | Temp 98.4°F | Ht 62.75 in | Wt 235.5 lb

## 2012-02-23 DIAGNOSIS — E039 Hypothyroidism, unspecified: Secondary | ICD-10-CM

## 2012-02-23 DIAGNOSIS — Z0000 Encounter for general adult medical examination without abnormal findings: Secondary | ICD-10-CM

## 2012-02-23 DIAGNOSIS — N9489 Other specified conditions associated with female genital organs and menstrual cycle: Secondary | ICD-10-CM

## 2012-02-23 DIAGNOSIS — E785 Hyperlipidemia, unspecified: Secondary | ICD-10-CM

## 2012-02-23 DIAGNOSIS — I10 Essential (primary) hypertension: Secondary | ICD-10-CM

## 2012-02-23 DIAGNOSIS — N949 Unspecified condition associated with female genital organs and menstrual cycle: Secondary | ICD-10-CM

## 2012-02-23 LAB — COMPREHENSIVE METABOLIC PANEL
ALT: 17 U/L (ref 0–35)
Albumin: 4 g/dL (ref 3.5–5.2)
Alkaline Phosphatase: 58 U/L (ref 39–117)
Potassium: 3.8 mEq/L (ref 3.5–5.1)
Sodium: 140 mEq/L (ref 135–145)
Total Bilirubin: 1 mg/dL (ref 0.3–1.2)
Total Protein: 6.6 g/dL (ref 6.0–8.3)

## 2012-02-23 LAB — LIPID PANEL
LDL Cholesterol: 86 mg/dL (ref 0–99)
Total CHOL/HDL Ratio: 4
VLDL: 30.2 mg/dL (ref 0.0–40.0)

## 2012-02-23 LAB — TSH: TSH: 0.91 u[IU]/mL (ref 0.35–5.50)

## 2012-02-23 MED ORDER — ATORVASTATIN CALCIUM 20 MG PO TABS: 20.0000 mg | ORAL_TABLET | Freq: Every day | ORAL | Status: DC

## 2012-02-23 MED ORDER — LEVOTHYROXINE SODIUM 50 MCG PO TABS: 50.0000 ug | ORAL_TABLET | Freq: Every day | ORAL | Status: DC

## 2012-02-23 MED ORDER — SERTRALINE HCL 100 MG PO TABS: 100.0000 mg | ORAL_TABLET | Freq: Every day | ORAL | Status: DC

## 2012-02-23 MED ORDER — METOPROLOL SUCCINATE ER 100 MG PO TB24: 100.0000 mg | ORAL_TABLET | Freq: Every day | ORAL | Status: DC

## 2012-02-23 MED ORDER — HYDROCHLOROTHIAZIDE 25 MG PO TABS: 25.0000 mg | ORAL_TABLET | Freq: Every day | ORAL | Status: DC

## 2012-02-23 NOTE — Assessment & Plan Note (Signed)
BP well controlled with current medications. Will check renal function with labs today. Continue current medications. Follow up 6 months and prn.

## 2012-02-23 NOTE — Assessment & Plan Note (Signed)
Gen med exam normal today including breast exam. Genital exam remarkable for nodular area as described below. Will set up GYN eval for biopsy. Health maintenance is UTD.  Will check CBC, CMP, lipids. Follow up 6 months and prn.

## 2012-02-23 NOTE — Progress Notes (Signed)
Subjective:    Patient ID: Bonnie Shaw, female    DOB: 1950-12-30, 62 y.o.   MRN: [id number]  HPI 62 year old female with history of hyperlipidemia, hypothyroidism, hypertension presents for annual exam. She reports she is generally feeling well. She is up-to-date on health maintenance including mammogram. She reports continued nodular lesion in her left labia. This is been present for over one year. She reports it is slightly smaller in size. She denies any other concerns today.  Outpatient Encounter Prescriptions as of 02/23/2012  Medication Sig Dispense Refill  . aspirin 325 MG EC tablet Take 325 mg by mouth daily.        Marland Kitchen atorvastatin (LIPITOR) 20 MG tablet Take 1 tablet (20 mg total) by mouth daily.  90 tablet  4  . hydrochlorothiazide (HYDRODIURIL) 25 MG tablet Take 1 tablet (25 mg total) by mouth daily.  90 tablet  4  . levothyroxine (SYNTHROID, LEVOTHROID) 50 MCG tablet Take 1 tablet (50 mcg total) by mouth daily.  90 tablet  4  . Lutein 10 MG TABS Take by mouth daily.      . metoprolol succinate (TOPROL-XL) 100 MG 24 hr tablet Take 1 tablet (100 mg total) by mouth daily.  90 tablet  4  . naproxen sodium (ANAPROX) 220 MG tablet Take 220 mg by mouth 2 (two) times daily as needed.       . ranitidine (ZANTAC) 150 MG tablet Take 150 mg by mouth 2 (two) times daily as needed.        . sertraline (ZOLOFT) 100 MG tablet Take 1 tablet (100 mg total) by mouth daily.  90 tablet  4   BP 124/80  Pulse 60  Temp 98.4 F (36.9 C) (Oral)  Ht 5' 2.75" (1.594 m)  Wt 235 lb 8 oz (106.822 kg)  BMI 42.05 kg/m2  SpO2 97%  Review of Systems  Constitutional: Negative for fever, chills, appetite change, fatigue and unexpected weight change.  HENT: Negative for ear pain, congestion, sore throat, trouble swallowing, neck pain, voice change and sinus pressure.   Eyes: Negative for visual disturbance.  Respiratory: Negative for cough, shortness of breath, wheezing and stridor.   Cardiovascular: Negative  for chest pain, palpitations and leg swelling.  Gastrointestinal: Negative for nausea, vomiting, abdominal pain, diarrhea, constipation, blood in stool, abdominal distention and anal bleeding.  Genitourinary: Negative for dysuria and flank pain.  Musculoskeletal: Negative for myalgias, arthralgias and gait problem.  Skin: Negative for color change and rash.  Neurological: Negative for dizziness and headaches.  Hematological: Negative for adenopathy. Does not bruise/bleed easily.  Psychiatric/Behavioral: Negative for suicidal ideas, sleep disturbance and dysphoric mood. The patient is not nervous/anxious.        Objective:   Physical Exam  Constitutional: She is oriented to person, place, and time. She appears well-developed and well-nourished. No distress.  HENT:  Head: Normocephalic and atraumatic.  Right Ear: External ear normal.  Left Ear: External ear normal.  Nose: Nose normal.  Mouth/Throat: Oropharynx is clear and moist. No oropharyngeal exudate.  Eyes: Conjunctivae normal are normal. Pupils are equal, round, and reactive to light. Right eye exhibits no discharge. Left eye exhibits no discharge. No scleral icterus.  Neck: Normal range of motion. Neck supple. No tracheal deviation present. No thyromegaly present.  Cardiovascular: Normal rate, regular rhythm, normal heart sounds and intact distal pulses.  Exam reveals no gallop and no friction rub.   No murmur heard. Pulmonary/Chest: Effort normal and breath sounds normal. No respiratory distress. She has  no wheezes. She has no rales. She exhibits no tenderness.  Abdominal: Soft. Bowel sounds are normal. She exhibits no distension and no mass. There is no tenderness. There is no rebound and no guarding.  Genitourinary: Vagina normal and uterus normal.    No breast swelling, tenderness, discharge or bleeding. Pelvic exam was performed with patient supine. There is no rash, tenderness or lesion on the right labia. There is lesion on  the left labia. There is no rash or tenderness on the left labia.  Musculoskeletal: Normal range of motion. She exhibits no edema and no tenderness.  Lymphadenopathy:    She has no cervical adenopathy.  Neurological: She is alert and oriented to person, place, and time. No cranial nerve deficit. She exhibits normal muscle tone. Coordination normal.  Skin: Skin is warm and dry. No rash noted. She is not diaphoretic. No erythema. No pallor.  Psychiatric: She has a normal mood and affect. Her behavior is normal. Judgment and thought content normal.          Assessment & Plan:

## 2012-02-23 NOTE — Assessment & Plan Note (Signed)
Will check lipids and LFTs with labs today. Continue Atorvastatin. Follow up 6 months and prn.

## 2012-02-23 NOTE — Assessment & Plan Note (Signed)
Small nodular area approx 3mm just to pt left of midline of clitoris. Area has been persistent over 1 year. Suspect lesion is sebaceous cyst, however given persistence, will set up GYN evaluation with possible biopsy.

## 2012-02-23 NOTE — Assessment & Plan Note (Signed)
Will check TSH with labs today. Continue Synthroid. Follow up 6 months and prn.

## 2012-04-16 ENCOUNTER — Telehealth: Payer: Self-pay | Admitting: *Deleted

## 2012-04-16 DIAGNOSIS — E039 Hypothyroidism, unspecified: Secondary | ICD-10-CM

## 2012-04-16 DIAGNOSIS — E785 Hyperlipidemia, unspecified: Secondary | ICD-10-CM

## 2012-04-16 DIAGNOSIS — I10 Essential (primary) hypertension: Secondary | ICD-10-CM

## 2012-04-16 MED ORDER — LEVOTHYROXINE SODIUM 50 MCG PO TABS: 50.0000 ug | ORAL_TABLET | Freq: Every day | ORAL | Status: DC

## 2012-04-16 MED ORDER — METOPROLOL SUCCINATE ER 100 MG PO TB24: 100.0000 mg | ORAL_TABLET | Freq: Every day | ORAL | Status: DC

## 2012-04-16 MED ORDER — HYDROCHLOROTHIAZIDE 25 MG PO TABS: 25.0000 mg | ORAL_TABLET | Freq: Every day | ORAL | Status: DC

## 2012-04-16 MED ORDER — ATORVASTATIN CALCIUM 20 MG PO TABS: 20.0000 mg | ORAL_TABLET | Freq: Every day | ORAL | Status: DC

## 2012-04-16 NOTE — Addendum Note (Signed)
Addended by: Theola Sequin on: 04/16/2012 05:02 PM   Modules accepted: Orders

## 2012-04-16 NOTE — Telephone Encounter (Signed)
Patient called requesting six month refill on all her meds. She has no refills left on metoprolol.

## 2012-04-16 NOTE — Telephone Encounter (Signed)
Rx has been sent  

## 2012-05-28 ENCOUNTER — Telehealth: Payer: Self-pay | Admitting: *Deleted

## 2012-05-28 NOTE — Telephone Encounter (Signed)
Patient walked in requesting to be seen this morning, informed her Dr. Dan Humphreys was only here for half a day and did not have any openings. Offered an appointment by Zella Ball and myself for her to see Dr. Darrick Huntsman this afternoon at 3:15 patient declined and stated she would rather go to an urgent care this morning. Complaining of cough and congestion with headache, stated she thought it was getting better but seem to have gotten worse through the night. So when she woke up this morning she thought she would just walk in so she would already be here instead of calling.

## 2012-07-20 ENCOUNTER — Ambulatory Visit: Payer: Self-pay | Admitting: Internal Medicine

## 2012-07-20 LAB — HM MAMMOGRAPHY: HM Mammogram: NORMAL

## 2012-08-04 ENCOUNTER — Encounter: Payer: Self-pay | Admitting: Internal Medicine

## 2012-08-25 ENCOUNTER — Ambulatory Visit (INDEPENDENT_AMBULATORY_CARE_PROVIDER_SITE_OTHER): Payer: 59 | Admitting: Internal Medicine

## 2012-08-25 ENCOUNTER — Encounter: Payer: Self-pay | Admitting: Internal Medicine

## 2012-08-25 VITALS — BP 138/90 | HR 55 | Temp 98.2°F | Wt 236.0 lb

## 2012-08-25 DIAGNOSIS — E039 Hypothyroidism, unspecified: Secondary | ICD-10-CM

## 2012-08-25 DIAGNOSIS — R5383 Other fatigue: Secondary | ICD-10-CM

## 2012-08-25 DIAGNOSIS — N9489 Other specified conditions associated with female genital organs and menstrual cycle: Secondary | ICD-10-CM

## 2012-08-25 DIAGNOSIS — E785 Hyperlipidemia, unspecified: Secondary | ICD-10-CM

## 2012-08-25 DIAGNOSIS — F4323 Adjustment disorder with mixed anxiety and depressed mood: Secondary | ICD-10-CM

## 2012-08-25 DIAGNOSIS — I10 Essential (primary) hypertension: Secondary | ICD-10-CM

## 2012-08-25 DIAGNOSIS — N949 Unspecified condition associated with female genital organs and menstrual cycle: Secondary | ICD-10-CM

## 2012-08-25 DIAGNOSIS — E669 Obesity, unspecified: Secondary | ICD-10-CM

## 2012-08-25 DIAGNOSIS — R5381 Other malaise: Secondary | ICD-10-CM

## 2012-08-25 LAB — CBC WITH DIFFERENTIAL/PLATELET
Basophils Relative: 0.5 % (ref 0.0–3.0)
Eosinophils Relative: 2.5 % (ref 0.0–5.0)
Lymphocytes Relative: 38 % (ref 12.0–46.0)
MCV: 92.9 fl (ref 78.0–100.0)
Monocytes Absolute: 0.6 10*3/uL (ref 0.1–1.0)
Monocytes Relative: 8.2 % (ref 3.0–12.0)
Neutrophils Relative %: 50.8 % (ref 43.0–77.0)
Platelets: 215 10*3/uL (ref 150.0–400.0)
RBC: 4.48 Mil/uL (ref 3.87–5.11)
WBC: 7.9 10*3/uL (ref 4.5–10.5)

## 2012-08-25 LAB — COMPREHENSIVE METABOLIC PANEL
AST: 17 U/L (ref 0–37)
Albumin: 4 g/dL (ref 3.5–5.2)
BUN: 11 mg/dL (ref 6–23)
CO2: 28 mEq/L (ref 19–32)
Calcium: 9.5 mg/dL (ref 8.4–10.5)
Chloride: 105 mEq/L (ref 96–112)
Creatinine, Ser: 0.7 mg/dL (ref 0.4–1.2)
GFR: 85.6 mL/min (ref >60.00)
Glucose, Bld: 103 mg/dL — ABNORMAL HIGH (ref 70–99)
Potassium: 3.8 mEq/L (ref 3.5–5.1)

## 2012-08-25 MED ORDER — SERTRALINE HCL 50 MG PO TABS: 50.0000 mg | ORAL_TABLET | Freq: Every day | ORAL | Status: DC

## 2012-08-25 MED ORDER — ATORVASTATIN CALCIUM 20 MG PO TABS: 20.0000 mg | ORAL_TABLET | Freq: Every day | ORAL | Status: DC

## 2012-08-25 MED ORDER — HYDROCHLOROTHIAZIDE 25 MG PO TABS: 25.0000 mg | ORAL_TABLET | Freq: Every day | ORAL | Status: DC

## 2012-08-25 MED ORDER — METOPROLOL SUCCINATE ER 100 MG PO TB24: 100.0000 mg | ORAL_TABLET | Freq: Every day | ORAL | Status: DC

## 2012-08-25 MED ORDER — LEVOTHYROXINE SODIUM 50 MCG PO TABS: 50.0000 ug | ORAL_TABLET | Freq: Every day | ORAL | Status: DC

## 2012-08-25 NOTE — Assessment & Plan Note (Signed)
Recent increased fatigue. Will check TSH and CBC with labs. Also recommended sleep study, pt declines. Encouraged increased physical activity and caloric restriction with goal of weight loss.

## 2012-08-25 NOTE — Assessment & Plan Note (Signed)
Symptomatic with fatigue. Will check TSH and Free T4 with labs today.

## 2012-08-25 NOTE — Assessment & Plan Note (Signed)
Seen by GYN. Recommended surgical biopsy. Pt prefers to monitor for now.

## 2012-08-25 NOTE — Patient Instructions (Signed)
Take a look at [email or web address]

## 2012-08-25 NOTE — Progress Notes (Signed)
Subjective:    Patient ID: Bonnie Shaw, female    DOB: 03/18/1950, 62 y.o.   MRN: [id number]  HPI 62 year old female with history of obesity, hypothyroidism, hypertension, hyperlipidemia presents for followup. She reports that she is generally been feeling well. She has been working from home and has been less physically active. She notes some daytime fatigue. She reports feeling rested when she first wakes up been exhausted during the day. She denies any focal symptoms such as change in appetite, chest pain, shortness of breath. She has been exercising by doing aerobics video 5 days per week. She is not following a particular diet.  Outpatient Encounter Prescriptions as of 08/25/2012  Medication Sig Dispense Refill  . aspirin 325 MG EC tablet Take 325 mg by mouth daily.        Marland Kitchen atorvastatin (LIPITOR) 20 MG tablet Take 1 tablet (20 mg total) by mouth daily.  90 tablet  3  . hydrochlorothiazide (HYDRODIURIL) 25 MG tablet Take 1 tablet (25 mg total) by mouth daily.  90 tablet  3  . levothyroxine (SYNTHROID, LEVOTHROID) 50 MCG tablet Take 1 tablet (50 mcg total) by mouth daily.  90 tablet  3  . metoprolol succinate (TOPROL-XL) 100 MG 24 hr tablet Take 1 tablet (100 mg total) by mouth daily.  90 tablet  3  . ranitidine (ZANTAC) 150 MG tablet Take 150 mg by mouth 2 (two) times daily as needed.        . sertraline (ZOLOFT) 50 MG tablet Take 1 tablet (50 mg total) by mouth daily.  90 tablet  4  . Lutein 10 MG TABS Take by mouth daily.      . naproxen sodium (ANAPROX) 220 MG tablet Take 220 mg by mouth 2 (two) times daily as needed.        No facility-administered encounter medications on file as of 08/25/2012.   BP 138/90  Pulse 55  Temp(Src) 98.2 F (36.8 C) (Oral)  Wt 236 lb (107.049 kg)  BMI 42.13 kg/m2  SpO2 96%  Review of Systems  Constitutional: Positive for fatigue. Negative for fever, chills, appetite change and unexpected weight change.  HENT: Negative for ear pain, congestion, sore  throat, trouble swallowing, neck pain, voice change and sinus pressure.   Eyes: Negative for visual disturbance.  Respiratory: Negative for cough, shortness of breath, wheezing and stridor.   Cardiovascular: Negative for chest pain, palpitations and leg swelling.  Gastrointestinal: Negative for nausea, vomiting, abdominal pain, diarrhea, constipation, blood in stool, abdominal distention and anal bleeding.  Genitourinary: Negative for dysuria and flank pain.  Musculoskeletal: Negative for myalgias, arthralgias and gait problem.  Skin: Negative for color change and rash.  Neurological: Negative for dizziness and headaches.  Hematological: Negative for adenopathy. Does not bruise/bleed easily.  Psychiatric/Behavioral: Negative for suicidal ideas, sleep disturbance and dysphoric mood. The patient is not nervous/anxious.        Objective:   Physical Exam  Constitutional: She is oriented to person, place, and time. She appears well-developed and well-nourished. No distress.  HENT:  Head: Normocephalic and atraumatic.  Right Ear: External ear normal.  Left Ear: External ear normal.  Nose: Nose normal.  Mouth/Throat: Oropharynx is clear and moist. No oropharyngeal exudate.  Eyes: Conjunctivae are normal. Pupils are equal, round, and reactive to light. Right eye exhibits no discharge. Left eye exhibits no discharge. No scleral icterus.  Neck: Normal range of motion. Neck supple. No tracheal deviation present. No thyromegaly present.  Cardiovascular: Normal rate, regular rhythm, normal  heart sounds and intact distal pulses.  Exam reveals no gallop and no friction rub.   No murmur heard. Pulmonary/Chest: Effort normal and breath sounds normal. No accessory muscle usage. Not tachypneic. No respiratory distress. She has no decreased breath sounds. She has no wheezes. She has no rhonchi. She has no rales. She exhibits no tenderness.  Musculoskeletal: Normal range of motion. She exhibits no edema and  no tenderness.  Lymphadenopathy:    She has no cervical adenopathy.  Neurological: She is alert and oriented to person, place, and time. No cranial nerve deficit. She exhibits normal muscle tone. Coordination normal.  Skin: Skin is warm and dry. No rash noted. She is not diaphoretic. No erythema. No pallor.  Psychiatric: She has a normal mood and affect. Her behavior is normal. Judgment and thought content normal.          Assessment & Plan:

## 2012-08-25 NOTE — Assessment & Plan Note (Signed)
Body mass index is 42.13 kg/(m^2).  Wt Readings from Last 3 Encounters:  08/25/12 236 lb (107.049 kg)  02/23/12 235 lb 8 oz (106.822 kg)  08/22/11 231 lb 8 oz (105.008 kg)   Encouraged keeping a food diary. Encouraged regular physical activity with goal of 3x per week.

## 2012-08-25 NOTE — Assessment & Plan Note (Signed)
Will check LFTs with labs today. Continue Atorvastatin. 

## 2012-08-25 NOTE — Assessment & Plan Note (Signed)
BP Readings from Last 3 Encounters:  08/25/12 138/90  02/23/12 124/80  08/22/11 132/82   BP generally well controlled on current medications. Will continue. Check renal function with labs today.

## 2012-12-09 ENCOUNTER — Other Ambulatory Visit: Payer: Self-pay

## 2013-02-23 ENCOUNTER — Encounter: Payer: 59 | Admitting: Internal Medicine

## 2013-02-24 ENCOUNTER — Encounter: Payer: Self-pay | Admitting: Internal Medicine

## 2013-02-24 ENCOUNTER — Telehealth: Payer: Self-pay | Admitting: Internal Medicine

## 2013-02-24 ENCOUNTER — Ambulatory Visit (INDEPENDENT_AMBULATORY_CARE_PROVIDER_SITE_OTHER): Payer: 59 | Admitting: Internal Medicine

## 2013-02-24 ENCOUNTER — Telehealth: Payer: Self-pay | Admitting: *Deleted

## 2013-02-24 VITALS — BP 140/90 | HR 76 | Temp 98.0°F | Ht 62.0 in | Wt 236.0 lb

## 2013-02-24 DIAGNOSIS — J40 Bronchitis, not specified as acute or chronic: Secondary | ICD-10-CM | POA: Insufficient documentation

## 2013-02-24 DIAGNOSIS — Z0000 Encounter for general adult medical examination without abnormal findings: Secondary | ICD-10-CM

## 2013-02-24 DIAGNOSIS — B9789 Other viral agents as the cause of diseases classified elsewhere: Secondary | ICD-10-CM

## 2013-02-24 DIAGNOSIS — J209 Acute bronchitis, unspecified: Secondary | ICD-10-CM

## 2013-02-24 DIAGNOSIS — J069 Acute upper respiratory infection, unspecified: Secondary | ICD-10-CM

## 2013-02-24 MED ORDER — HYDROCOD POLST-CHLORPHEN POLST 10-8 MG/5ML PO LQCR: 5.0000 mL | Freq: Two times a day (BID) | ORAL | Status: DC | PRN

## 2013-02-24 MED ORDER — AMOXICILLIN-POT CLAVULANATE 875-125 MG PO TABS: 1.0000 | ORAL_TABLET | Freq: Two times a day (BID) | ORAL | Status: DC

## 2013-02-24 NOTE — Telephone Encounter (Signed)
Patient called stating she was seen today and given Augmentin. She has taken 1 pill, then she dozed off and fell asleep. When she woke up her heart was racing, it was 120 bpm when she took it and she is still feeling a little dizzy. She did read the side effects of the medication and she did notice that is one of the side effects. If side effects were present then she need to contact her doctor. Please advise.

## 2013-02-24 NOTE — Telephone Encounter (Signed)
Spoke with patient informed her of Dr. Thomes Dinning instructions patient verbalized understanding.

## 2013-02-24 NOTE — Assessment & Plan Note (Signed)
Symptoms and exam consistent with viral URI with early bronchitis. Rapid flu was negative, however initial symptoms c/w flu. Discussed monitoring her symptoms for the next few days and, if no consistent improvement, starting Augmentin. Will use Tussionex prn for cough. Encouraged rest, adequate fluid intake. She will RTC if symptoms do not continue to improve or if any new concerns, recurrent fever, chest pain, dyspnea.

## 2013-02-24 NOTE — Progress Notes (Signed)
Pre-visit discussion using our clinic review tool. No additional management support is needed unless otherwise documented below in the visit note.  

## 2013-02-24 NOTE — Progress Notes (Signed)
Subjective:    Patient ID: Bonnie Shaw, female    DOB: Aug 01, 1950, 63 y.o.   MRN: [id number]  HPI 63YO female presents for acute visit. Husband became ill with URI symptoms on Saturday. Pt symptoms developed Monday with dry cough, body aches, fever 101.60F, felt like she "hit a brick wall," nauseous, poor appetite, diarrhea. Cough now more productive throughout the week with purulent sputum. Using cough syrup with some improvement. Taking Tylenol with some improvement. No dyspnea, chest pain.  Outpatient Encounter Prescriptions as of 02/24/2013  Medication Sig  . acetaminophen (TYLENOL) 500 MG tablet Take 500 mg by mouth every 6 (six) hours as needed.  Marland Kitchen aspirin 325 MG EC tablet Take 325 mg by mouth daily.    Marland Kitchen atorvastatin (LIPITOR) 20 MG tablet Take 1 tablet (20 mg total) by mouth daily.  . hydrochlorothiazide (HYDRODIURIL) 25 MG tablet Take 1 tablet (25 mg total) by mouth daily.  Marland Kitchen levothyroxine (SYNTHROID, LEVOTHROID) 50 MCG tablet Take 1 tablet (50 mcg total) by mouth daily.  . metoprolol succinate (TOPROL-XL) 100 MG 24 hr tablet Take 1 tablet (100 mg total) by mouth daily.  . naproxen sodium (ANAPROX) 220 MG tablet Take 220 mg by mouth 2 (two) times daily as needed.   . Lutein 10 MG TABS Take by mouth daily.  . ranitidine (ZANTAC) 150 MG tablet Take 150 mg by mouth 2 (two) times daily as needed.    . sertraline (ZOLOFT) 50 MG tablet Take 1 tablet (50 mg total) by mouth daily.    BP 140/90  Pulse 76  Temp(Src) 98 F (36.7 C) (Oral)  Ht 5\' 2"  (1.575 m)  Wt 236 lb (107.049 kg)  BMI 43.15 kg/m2  SpO2 96%  Review of Systems  Constitutional: Positive for fatigue. Negative for fever, chills and unexpected weight change.  HENT: Positive for congestion and postnasal drip. Negative for ear discharge, ear pain, facial swelling, hearing loss, mouth sores, nosebleeds, rhinorrhea, sinus pressure, sneezing, sore throat, tinnitus, trouble swallowing and voice change.   Eyes: Negative for  pain, discharge, redness and visual disturbance.  Respiratory: Positive for cough. Negative for chest tightness, shortness of breath, wheezing and stridor.   Cardiovascular: Negative for chest pain, palpitations and leg swelling.  Musculoskeletal: Negative for arthralgias, myalgias, neck pain and neck stiffness.  Skin: Negative for color change and rash.  Neurological: Negative for dizziness, weakness, light-headedness and headaches.  Hematological: Negative for adenopathy.       Objective:   Physical Exam  Constitutional: She is oriented to person, place, and time. She appears well-developed and well-nourished. No distress.  HENT:  Head: Normocephalic and atraumatic.  Right Ear: External ear normal.  Left Ear: External ear normal.  Nose: Nose normal.  Mouth/Throat: Oropharynx is clear and moist. No oropharyngeal exudate.  Eyes: Conjunctivae are normal. Pupils are equal, round, and reactive to light. Right eye exhibits no discharge. Left eye exhibits no discharge. No scleral icterus.  Neck: Normal range of motion. Neck supple. No tracheal deviation present. No thyromegaly present.  Cardiovascular: Normal rate, regular rhythm, normal heart sounds and intact distal pulses.  Exam reveals no gallop and no friction rub.   No murmur heard. Pulmonary/Chest: Effort normal. No accessory muscle usage. Not tachypneic. No respiratory distress. She has no decreased breath sounds. She has no wheezes. She has rhonchi (scattered). She has no rales. She exhibits no tenderness.  Musculoskeletal: Normal range of motion. She exhibits no edema and no tenderness.  Lymphadenopathy:    She has no  cervical adenopathy.  Neurological: She is alert and oriented to person, place, and time. No cranial nerve deficit. She exhibits normal muscle tone. Coordination normal.  Skin: Skin is warm and dry. No rash noted. She is not diaphoretic. No erythema. No pallor.  Psychiatric: She has a normal mood and affect. Her  behavior is normal. Judgment and thought content normal.          Assessment & Plan:

## 2013-02-24 NOTE — Telephone Encounter (Signed)
Can you please call pt to check on her for follow up on 1/24

## 2013-02-24 NOTE — Telephone Encounter (Signed)
She should not have heart racing as a side effect from Augmentin. However, she should STOP the medication. If persistent racing heart or dizziness then she needs to be seen immediately.

## 2013-02-25 NOTE — Telephone Encounter (Signed)
Called and spoke with patient, she is doing better. After that one episode she went to sleep and her heart stop racing, she through the night with no problem. Per patient this is not the first time she has had one of these episodes. Does she supposed to be scheduled for a follow up appointment?

## 2013-02-25 NOTE — Telephone Encounter (Signed)
Okay. I would just hold the antibiotic for now. If her cold symptoms do not continue to improve or if any recurrent symptoms such as racing heart beat, then needs to be seen.

## 2013-02-25 NOTE — Telephone Encounter (Signed)
Patient informed and verbalized understanding

## 2013-03-23 ENCOUNTER — Encounter: Payer: Self-pay | Admitting: *Deleted

## 2013-04-14 ENCOUNTER — Encounter: Payer: 59 | Admitting: Internal Medicine

## 2013-04-28 ENCOUNTER — Encounter: Payer: Self-pay | Admitting: Internal Medicine

## 2013-04-28 ENCOUNTER — Ambulatory Visit (INDEPENDENT_AMBULATORY_CARE_PROVIDER_SITE_OTHER): Payer: 59 | Admitting: Internal Medicine

## 2013-04-28 VITALS — BP 130/78 | HR 66 | Temp 98.1°F | Resp 16 | Ht 62.5 in | Wt 238.0 lb

## 2013-04-28 DIAGNOSIS — Z0000 Encounter for general adult medical examination without abnormal findings: Secondary | ICD-10-CM

## 2013-04-28 DIAGNOSIS — E039 Hypothyroidism, unspecified: Secondary | ICD-10-CM

## 2013-04-28 DIAGNOSIS — E669 Obesity, unspecified: Secondary | ICD-10-CM

## 2013-04-28 DIAGNOSIS — E785 Hyperlipidemia, unspecified: Secondary | ICD-10-CM

## 2013-04-28 DIAGNOSIS — I10 Essential (primary) hypertension: Secondary | ICD-10-CM

## 2013-04-28 LAB — CBC WITH DIFFERENTIAL/PLATELET
Basophils Absolute: 0.1 10*3/uL (ref 0.0–0.1)
Basophils Relative: 0.8 % (ref 0.0–3.0)
Eosinophils Absolute: 0.1 10*3/uL (ref 0.0–0.7)
Eosinophils Relative: 1.6 % (ref 0.0–5.0)
HCT: 42.8 % (ref 36.0–46.0)
Hemoglobin: 14.6 g/dL (ref 12.0–15.0)
Lymphocytes Relative: 35.1 % (ref 12.0–46.0)
Lymphs Abs: 2.7 10*3/uL (ref 0.7–4.0)
MCHC: 34 g/dL (ref 30.0–36.0)
MCV: 92.8 fl (ref 78.0–100.0)
MONO ABS: 0.6 10*3/uL (ref 0.1–1.0)
Monocytes Relative: 8 % (ref 3.0–12.0)
NEUTROS PCT: 54.5 % (ref 43.0–77.0)
Neutro Abs: 4.2 10*3/uL (ref 1.4–7.7)
PLATELETS: 200 10*3/uL (ref 150.0–400.0)
RBC: 4.62 Mil/uL (ref 3.87–5.11)
RDW: 14 % (ref 11.5–14.6)
WBC: 7.8 10*3/uL (ref 4.5–10.5)

## 2013-04-28 LAB — COMPREHENSIVE METABOLIC PANEL
ALBUMIN: 4.3 g/dL (ref 3.5–5.2)
ALK PHOS: 57 U/L (ref 39–117)
ALT: 33 U/L (ref 0–35)
AST: 25 U/L (ref 0–37)
BUN: 14 mg/dL (ref 6–23)
CALCIUM: 9.5 mg/dL (ref 8.4–10.5)
CHLORIDE: 103 meq/L (ref 96–112)
CO2: 28 mEq/L (ref 19–32)
Creatinine, Ser: 0.8 mg/dL (ref 0.4–1.2)
GFR: 81.54 mL/min (ref >60.00)
GLUCOSE: 93 mg/dL (ref 70–99)
Potassium: 3.6 mEq/L (ref 3.5–5.1)
SODIUM: 139 meq/L (ref 135–145)
TOTAL PROTEIN: 6.7 g/dL (ref 6.0–8.3)
Total Bilirubin: 1.2 mg/dL (ref 0.3–1.2)

## 2013-04-28 LAB — LIPID PANEL
CHOL/HDL RATIO: 4
Cholesterol: 174 mg/dL (ref 0–200)
HDL: 45.8 mg/dL (ref >39.00)
LDL Cholesterol: 87 mg/dL (ref 0–99)
Triglycerides: 204 mg/dL — ABNORMAL HIGH (ref 0.0–149.0)
VLDL: 40.8 mg/dL — ABNORMAL HIGH (ref 0.0–40.0)

## 2013-04-28 LAB — TSH: TSH: 1.64 u[IU]/mL (ref 0.35–5.50)

## 2013-04-28 LAB — MICROALBUMIN / CREATININE URINE RATIO
Creatinine,U: 151.1 mg/dL
MICROALB/CREAT RATIO: 0.8 mg/g (ref 0.0–30.0)
Microalb, Ur: 1.2 mg/dL (ref 0.0–1.9)

## 2013-04-28 LAB — VITAMIN B12: VITAMIN B 12: 453 pg/mL (ref 211–911)

## 2013-04-28 LAB — T4, FREE: Free T4: 0.87 ng/dL (ref 0.60–1.60)

## 2013-04-28 MED ORDER — METOPROLOL SUCCINATE ER 100 MG PO TB24
100.0000 mg | ORAL_TABLET | Freq: Every day | ORAL | Status: DC
Start: 2013-04-28 — End: 2014-01-02

## 2013-04-28 MED ORDER — HYDROCHLOROTHIAZIDE 25 MG PO TABS: 25.0000 mg | ORAL_TABLET | Freq: Every day | ORAL | Status: DC

## 2013-04-28 MED ORDER — ATORVASTATIN CALCIUM 20 MG PO TABS
20.0000 mg | ORAL_TABLET | Freq: Every day | ORAL | Status: DC
Start: 2013-04-28 — End: 2014-01-02

## 2013-04-28 MED ORDER — LEVOTHYROXINE SODIUM 50 MCG PO TABS: 50.0000 ug | ORAL_TABLET | Freq: Every day | ORAL | Status: DC

## 2013-04-28 NOTE — Assessment & Plan Note (Signed)
BP Readings from Last 3 Encounters:  04/28/13 130/78  02/24/13 140/90  08/25/12 138/90   BP well controlled on current medication. Will continue.

## 2013-04-28 NOTE — Assessment & Plan Note (Signed)
General medical exam including breast exam normal today except as noted. Pap and pelvic deferred per patient preference and history of hysterectomy. Mammogram up-to-date. Colonoscopy is up-to-date. Encouraged healthy diet and regular physical activity with goal of weight loss. Will check labs today including CBC, CMP, lipid profile. Immunizations are up-to-date.

## 2013-04-28 NOTE — Progress Notes (Signed)
   Subjective:    Patient ID: Bonnie Shaw, female    DOB: 09-17-1950, 63 y.o.   MRN: [id number]  HPI 63YO female presents for annual exam. Tapered off zoloft. Dance aerobics 5 days per week.  Review of Systems     Objective:    BP 130/78  Pulse 66  Temp(Src) 98.1 F (36.7 C) (Oral)  Resp 16  Ht 5' 2.5" (1.588 m)  Wt 238 lb (107.956 kg)  BMI 42.81 kg/m2  SpO2 97% Physical Exam        Assessment & Plan:   Problem List Items Addressed This Visit   None       No Follow-up on file.

## 2013-04-28 NOTE — Assessment & Plan Note (Signed)
Lipids well controlled except for triglycerides. Encouraged Mediterranean style diet and exercise with goal of weight loss. Continue Atorvastatin.

## 2013-04-28 NOTE — Progress Notes (Signed)
Pre visit review using our clinic review tool, if applicable. No additional management support is needed unless otherwise documented below in the visit note. 

## 2013-04-28 NOTE — Assessment & Plan Note (Signed)
Thyroid function normal on labs. Will continue Levothyroxine.

## 2013-04-28 NOTE — Assessment & Plan Note (Signed)
Wt Readings from Last 3 Encounters:  04/28/13 238 lb (107.956 kg)  02/24/13 236 lb (107.049 kg)  08/25/12 236 lb (107.049 kg)   Encouraged efforts at weight loss including healthy diet and regular exercise. Patient notes joint pain which prohibits her from participating in exercise. Encouraged water activity.

## 2013-04-29 ENCOUNTER — Telehealth: Payer: Self-pay | Admitting: Internal Medicine

## 2013-04-29 LAB — VITAMIN D 25 HYDROXY (VIT D DEFICIENCY, FRACTURES): VIT D 25 HYDROXY: 45 ng/mL (ref 30–89)

## 2013-04-29 NOTE — Telephone Encounter (Signed)
Relevant patient education assigned to patient using Emmi. ° °

## 2013-06-29 ENCOUNTER — Telehealth: Payer: Self-pay | Admitting: Internal Medicine

## 2013-06-29 NOTE — Telephone Encounter (Signed)
Pt left msg asking [place] to schedule her mammogram at [facility].

## 2013-07-01 NOTE — Telephone Encounter (Signed)
Pt came in to office to check status of mammogram.  Pt made aware that she can schedule herself if she prefers.  Pt would like [place] to schedule.   States she cannot go next week, would have to be the week of June 8, any morning.  Prefers mornings around 9:00 a.m.  Do not schedule week of 6/22.

## 2013-08-11 ENCOUNTER — Ambulatory Visit: Payer: Self-pay | Admitting: Internal Medicine

## 2013-08-11 LAB — HM MAMMOGRAPHY: HM Mammogram: NEGATIVE

## 2013-08-15 ENCOUNTER — Encounter: Payer: Self-pay | Admitting: *Deleted

## 2013-08-22 ENCOUNTER — Encounter: Payer: Self-pay | Admitting: Internal Medicine

## 2013-09-26 NOTE — Telephone Encounter (Signed)
Per chart patient had mammogram on 10/12/13 at [place].

## 2013-11-02 ENCOUNTER — Ambulatory Visit: Payer: 59 | Admitting: Internal Medicine

## 2013-11-09 ENCOUNTER — Encounter: Payer: Self-pay | Admitting: Internal Medicine

## 2013-11-09 ENCOUNTER — Telehealth: Payer: Self-pay | Admitting: Internal Medicine

## 2013-11-09 ENCOUNTER — Ambulatory Visit (INDEPENDENT_AMBULATORY_CARE_PROVIDER_SITE_OTHER): Payer: 59 | Admitting: Internal Medicine

## 2013-11-09 VITALS — BP 140/70 | HR 69 | Temp 98.1°F | Resp 14 | Ht 62.5 in | Wt 247.5 lb

## 2013-11-09 DIAGNOSIS — F32A Depression, unspecified: Secondary | ICD-10-CM

## 2013-11-09 DIAGNOSIS — Z6838 Body mass index (BMI) 38.0-38.9, adult: Secondary | ICD-10-CM | POA: Insufficient documentation

## 2013-11-09 DIAGNOSIS — E039 Hypothyroidism, unspecified: Secondary | ICD-10-CM

## 2013-11-09 DIAGNOSIS — F329 Major depressive disorder, single episode, unspecified: Secondary | ICD-10-CM

## 2013-11-09 DIAGNOSIS — Z23 Encounter for immunization: Secondary | ICD-10-CM

## 2013-11-09 DIAGNOSIS — I10 Essential (primary) hypertension: Secondary | ICD-10-CM

## 2013-11-09 LAB — LIPID PANEL
CHOLESTEROL: 170 mg/dL (ref 0–200)
HDL: 39.3 mg/dL (ref >39.00)
LDL CALC: 93 mg/dL (ref 0–99)
NonHDL: 130.7
Total CHOL/HDL Ratio: 4
Triglycerides: 190 mg/dL — ABNORMAL HIGH (ref 0.0–149.0)
VLDL: 38 mg/dL (ref 0.0–40.0)

## 2013-11-09 LAB — CBC WITH DIFFERENTIAL/PLATELET
Basophils Absolute: 0 10*3/uL (ref 0.0–0.1)
Basophils Relative: 0.4 % (ref 0.0–3.0)
Eosinophils Absolute: 0.1 10*3/uL (ref 0.0–0.7)
Eosinophils Relative: 1.7 % (ref 0.0–5.0)
HCT: 43.4 % (ref 36.0–46.0)
HEMOGLOBIN: 14.7 g/dL (ref 12.0–15.0)
Lymphocytes Relative: 36.2 % (ref 12.0–46.0)
Lymphs Abs: 2.9 10*3/uL (ref 0.7–4.0)
MCHC: 33.8 g/dL (ref 30.0–36.0)
MCV: 92.1 fl (ref 78.0–100.0)
MONO ABS: 0.6 10*3/uL (ref 0.1–1.0)
Monocytes Relative: 8 % (ref 3.0–12.0)
NEUTROS ABS: 4.3 10*3/uL (ref 1.4–7.7)
Neutrophils Relative %: 53.7 % (ref 43.0–77.0)
Platelets: 210 10*3/uL (ref 150.0–400.0)
RBC: 4.72 Mil/uL (ref 3.87–5.11)
RDW: 13.8 % (ref 11.5–15.5)
WBC: 8.1 10*3/uL (ref 4.0–10.5)

## 2013-11-09 LAB — TSH: TSH: 1.53 u[IU]/mL (ref 0.35–4.50)

## 2013-11-09 LAB — COMPREHENSIVE METABOLIC PANEL
ALK PHOS: 62 U/L (ref 39–117)
ALT: 29 U/L (ref 0–35)
AST: 23 U/L (ref 0–37)
Albumin: 3.6 g/dL (ref 3.5–5.2)
BUN: 13 mg/dL (ref 6–23)
CO2: 27 meq/L (ref 19–32)
CREATININE: 0.8 mg/dL (ref 0.4–1.2)
Calcium: 9.5 mg/dL (ref 8.4–10.5)
Chloride: 105 mEq/L (ref 96–112)
GFR: 82.65 mL/min (ref >60.00)
GLUCOSE: 91 mg/dL (ref 70–99)
Potassium: 4.2 mEq/L (ref 3.5–5.1)
SODIUM: 141 meq/L (ref 135–145)
TOTAL PROTEIN: 7 g/dL (ref 6.0–8.3)
Total Bilirubin: 1 mg/dL (ref 0.2–1.2)

## 2013-11-09 LAB — HEMOGLOBIN A1C: HEMOGLOBIN A1C: 5.8 % (ref 4.6–6.5)

## 2013-11-09 LAB — MICROALBUMIN / CREATININE URINE RATIO
Creatinine,U: 115.4 mg/dL
MICROALB UR: 0.6 mg/dL (ref 0.0–1.9)
Microalb Creat Ratio: 0.5 mg/g (ref 0.0–30.0)

## 2013-11-09 NOTE — Assessment & Plan Note (Signed)
Longstanding depression. Previously on Sertraline and symptoms better controlled. Encouraged her to consider getting back on medication. She declines for now. No suicidal ideation. Good support at home.

## 2013-11-09 NOTE — Assessment & Plan Note (Signed)
BP Readings from Last 3 Encounters:  11/09/13 140/70  04/28/13 130/78  02/24/13 140/90   BP generally well controlled on current medication. Renal function with labs today.

## 2013-11-09 NOTE — Patient Instructions (Signed)
Labs today.  Follow up in 6 months or sooner as needed. 

## 2013-11-09 NOTE — Assessment & Plan Note (Signed)
Will check TSH with labs today. Continue Levothyroxine. 

## 2013-11-09 NOTE — Assessment & Plan Note (Signed)
Wt Readings from Last 3 Encounters:  11/09/13 247 lb 8 oz (112.265 kg)  04/28/13 238 lb (107.956 kg)  02/24/13 236 lb (107.049 kg)   Body mass index is 44.52 kg/(m^2). Discussed risks of obesity. Encouraged her to keep a food diary and limit intake of carbohydrates. Encouraged exercise such as using a stationary bike. Discussed potential use of medications. Will check A1c, TSH with labs today.

## 2013-11-09 NOTE — Telephone Encounter (Signed)
Pt request to have a whooping cough vaccine. Please advise.msn

## 2013-11-09 NOTE — Progress Notes (Signed)
Pre visit review using our clinic review tool, if applicable. No additional management support is needed unless otherwise documented below in the visit note. 

## 2013-11-09 NOTE — Progress Notes (Signed)
Subjective:    Patient ID: Bonnie Shaw, female    DOB: 09-05-1950, 63 y.o.   MRN: [id number]  HPI 63YO female presents for follow up.  Last 6 months "worst of life." Dreads physical activity. Feels tired most of the time.  Feels more sad recently. Stopped taking Zoloft about 1 year ago. Does not want to restart this medication. Wants to manage symptoms on her own.  Has had palpitations off and on for years. Evaluated with Holter in the past which was normal. Does not want additional evaluation for this.   Review of Systems  Constitutional: Positive for fatigue. Negative for fever, chills, appetite change and unexpected weight change.  Eyes: Negative for visual disturbance.  Respiratory: Negative for shortness of breath.   Cardiovascular: Negative for chest pain and leg swelling.  Gastrointestinal: Negative for nausea, vomiting, abdominal pain, diarrhea and constipation.  Musculoskeletal: Positive for arthralgias and myalgias.  Skin: Negative for color change and rash.  Hematological: Negative for adenopathy. Does not bruise/bleed easily.  Psychiatric/Behavioral: Positive for dysphoric mood. Negative for suicidal ideas and sleep disturbance. The patient is not nervous/anxious.        Objective:    BP 140/70  Pulse 69  Temp(Src) 98.1 F (36.7 C) (Oral)  Resp 14  Ht 5' 2.5" (1.588 m)  Wt 247 lb 8 oz (112.265 kg)  BMI 44.52 kg/m2  SpO2 96% Physical Exam  Constitutional: She is oriented to person, place, and time. She appears well-developed and well-nourished. No distress.  HENT:  Head: Normocephalic and atraumatic.  Right Ear: External ear normal.  Left Ear: External ear normal.  Nose: Nose normal.  Mouth/Throat: Oropharynx is clear and moist. No oropharyngeal exudate.  Eyes: Conjunctivae are normal. Pupils are equal, round, and reactive to light. Right eye exhibits no discharge. Left eye exhibits no discharge. No scleral icterus.  Neck: Normal range of motion. Neck  supple. No tracheal deviation present. No thyromegaly present.  Cardiovascular: Normal rate, regular rhythm, normal heart sounds and intact distal pulses.  Exam reveals no gallop and no friction rub.   No murmur heard. Pulmonary/Chest: Effort normal and breath sounds normal. No accessory muscle usage. Not tachypneic. No respiratory distress. She has no decreased breath sounds. She has no wheezes. She has no rhonchi. She has no rales. She exhibits no tenderness.  Musculoskeletal: Normal range of motion. She exhibits no edema and no tenderness.  Lymphadenopathy:    She has no cervical adenopathy.  Neurological: She is alert and oriented to person, place, and time. No cranial nerve deficit. She exhibits normal muscle tone. Coordination normal.  Skin: Skin is warm and dry. No rash noted. She is not diaphoretic. No erythema. No pallor.  Psychiatric: Her speech is normal and behavior is normal. Judgment and thought content normal. Cognition and memory are normal. She exhibits a depressed mood. She expresses no suicidal ideation.          Assessment & Plan:   Problem List Items Addressed This Visit     Unprioritized   Depression     Longstanding depression. Previously on Sertraline and symptoms better controlled. Encouraged her to consider getting back on medication. She declines for now. No suicidal ideation. Good support at home.    Hypertension - Primary      BP Readings from Last 3 Encounters:  11/09/13 140/70  04/28/13 130/78  02/24/13 140/90   BP generally well controlled on current medication. Renal function with labs today.    Relevant Orders  Comprehensive metabolic panel      Lipid panel      Microalbumin / creatinine urine ratio   Hypothyroidism     Will check TSH with labs today. Continue Levothyroxine.    Severe obesity (BMI >= 40)      Wt Readings from Last 3 Encounters:  11/09/13 247 lb 8 oz (112.265 kg)  04/28/13 238 lb (107.956 kg)  02/24/13 236 lb (107.049  kg)   Body mass index is 44.52 kg/(m^2). Discussed risks of obesity. Encouraged her to keep a food diary and limit intake of carbohydrates. Encouraged exercise such as using a stationary bike. Discussed potential use of medications. Will check A1c, TSH with labs today.     Relevant Orders      Hemoglobin A1c      CBC with Differential      TSH    Other Visit Diagnoses   Encounter for immunization            Return in about 6 months (around 05/11/2014) for Wellness Visit.

## 2013-11-10 NOTE — Telephone Encounter (Signed)
Sent mychart message

## 2013-12-01 ENCOUNTER — Emergency Department: Payer: Self-pay | Admitting: Emergency Medicine

## 2013-12-01 LAB — CBC
HCT: 43.9 % (ref 35.0–47.0)
HGB: 14.8 g/dL (ref 12.0–16.0)
MCH: 31.2 pg (ref 26.0–34.0)
MCHC: 33.6 g/dL (ref 32.0–36.0)
MCV: 93 fL (ref 80–100)
Platelet: 215 10*3/uL (ref 150–440)
RBC: 4.73 10*6/uL (ref 3.80–5.20)
RDW: 13.7 % (ref 11.5–14.5)
WBC: 11 10*3/uL (ref 3.6–11.0)

## 2013-12-01 LAB — BASIC METABOLIC PANEL
Anion Gap: 9 (ref 7–16)
BUN: 19 mg/dL — AB (ref 7–18)
CHLORIDE: 106 mmol/L (ref 98–107)
CO2: 27 mmol/L (ref 21–32)
Calcium, Total: 8.7 mg/dL (ref 8.5–10.1)
Creatinine: 0.71 mg/dL (ref 0.60–1.30)
EGFR (Non-African Amer.): >60
GLUCOSE: 127 mg/dL — AB (ref 65–99)
OSMOLALITY: 287 (ref 275–301)
Potassium: 3.5 mmol/L (ref 3.5–5.1)
Sodium: 142 mmol/L (ref 136–145)

## 2013-12-01 LAB — TROPONIN I

## 2013-12-01 LAB — TSH: Thyroid Stimulating Horm: 4.81 u[IU]/mL — ABNORMAL HIGH

## 2013-12-02 ENCOUNTER — Encounter: Payer: Self-pay | Admitting: Cardiovascular Disease

## 2013-12-02 ENCOUNTER — Ambulatory Visit (INDEPENDENT_AMBULATORY_CARE_PROVIDER_SITE_OTHER): Payer: 59 | Admitting: Cardiovascular Disease

## 2013-12-02 VITALS — BP 130/90 | HR 61 | Ht 63.0 in | Wt 245.0 lb

## 2013-12-02 DIAGNOSIS — I4891 Unspecified atrial fibrillation: Secondary | ICD-10-CM

## 2013-12-02 DIAGNOSIS — I480 Paroxysmal atrial fibrillation: Secondary | ICD-10-CM

## 2013-12-02 DIAGNOSIS — I10 Essential (primary) hypertension: Secondary | ICD-10-CM

## 2013-12-02 DIAGNOSIS — F419 Anxiety disorder, unspecified: Secondary | ICD-10-CM

## 2013-12-02 MED ORDER — POTASSIUM CHLORIDE ER 10 MEQ PO TBCR: 10.0000 meq | EXTENDED_RELEASE_TABLET | Freq: Every day | ORAL | Status: DC

## 2013-12-02 MED ORDER — APIXABAN 5 MG PO TABS: 5.0000 mg | ORAL_TABLET | Freq: Two times a day (BID) | ORAL | Status: DC

## 2013-12-02 NOTE — Progress Notes (Signed)
Bonnie Shaw Date of Birth  Oct 01, 1950       [facility]    [employer] [address], [place], [place]  [address]   [place], [place]  [address] [place]   Fax  [phone]     Fax [phone]  Problem List: 1. Atrial fib 2. Hyperlipidemia 3. Hypertension 4. Anxiety 5. Hyperthyroidism-status post radioactive iodine ablation. She's now on thyroid replacement     History of Present Illness:  Bonnie Shaw has a hx of atrial fib ~ 4 years ago.  Saw Dr. Haroldine Laws. Was told that she had thyroid abnormalities - these typically resolved after treatment of her hyperthyroidism ( rradioactive iodine)   For the past 2 months, she has had more palpitations.  The episodes have gradually worsened over the past several weeks.   She had a prolonged episode of atrial fibrillation on October 28. She presented to [facility] emergency room and was found to have atrial fibrillation with a rapid rate. She converted to sinus rhythm while she was in the ER.  She was continued on her same home medications.  She has not had any recurrent episodes of atrial fibrillation since the 28th.  She rides an exercise bike 3-5 times a week for 30 minutes. Plays soccer with her grandchildren.  Is also limited by her bilateral knee replacement.  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 aspirin 325 MG EC tablet Take 325 mg by mouth daily.        Marland Kitchen atorvastatin (LIPITOR) 20 MG tablet Take 1 tablet (20 mg total) by mouth daily.  90 tablet  3  . hydrochlorothiazide (HYDRODIURIL) 25 MG tablet Take 1 tablet (25 mg total) by mouth daily.  90 tablet  3  . levothyroxine (SYNTHROID, LEVOTHROID) 50 MCG tablet Take 1 tablet (50 mcg total) by mouth daily.  90 tablet  3  . metoprolol succinate (TOPROL-XL) 100 MG  24 hr tablet Take 1 tablet (100 mg total) by mouth daily.  90 tablet  3  . Multiple Vitamin (MULTIVITAMIN) tablet Take 1 tablet by mouth daily.       No current facility-administered medications on file prior to visit.    Allergies  Allergen Reactions  . Sulfonamide Derivatives     Past Medical History  Diagnosis Date  . Hyperlipidemia   . Hypertension   . Anxiety   . Palpitations   . Atrial fibrillation   . Panic disorder   . Hyperthyroidism   . Cataract     Past Surgical History  Procedure Laterality Date  . Thyroid ablation  2010  . Cataract extraction    . Abdominal hysterectomy    . Knee surgery      left knee    History  Smoking status  . Never Smoker   Smokeless tobacco  . Never Used    History  Alcohol Use No    Family History  Problem Relation Age of Onset  . Alzheimer's disease Father   . Stroke Mother   . Cancer Sister     ovarian     Reviw of Systems:  Reviewed in the HPI.  All other systems are negative.  Physical Exam: Blood pressure 130/90, pulse 61, height 5\' 3"  (1.6 m), weight 245 lb (  111.131 kg). Wt Readings from Last 3 Encounters:  12/02/13 245 lb (111.131 kg)  11/09/13 247 lb 8 oz (112.265 kg)  04/28/13 238 lb (107.956 kg)     General: Well developed, well nourished, in no acute distress.  Head: Normocephalic, atraumatic, sclera non-icteric, mucus membranes are moist,   Neck: Supple. Carotids are 2 + without bruits. No JVD   Lungs: Clear   Heart: RR, normal S1S2  Abdomen: Soft, non-tender, non-distended with normal bowel sounds  Msk:  Strength and tone are normal   Extremities: No clubbing or cyanosis. No edema.  Distal pedal pulses are 2+ and equal    Neuro: CN II - XII intact.  Alert and oriented X 3.   Psych:  Normal   ECG: Oct. 28, 2015:  Atrial fib with vent. Response of 117.  Oct. 30, 2015:  NSR at 70   Assessment / Plan:

## 2013-12-02 NOTE — Patient Instructions (Addendum)
Psychologist Ezekiel Slocumb [phone]  Please stop aspirin  Please start Eliquis 5 mg twice daily Please start K-dur 20mg  once daily  Your physician has requested that you have an echocardiogram. Echocardiography is a painless test that uses sound waves to create images of your heart. It provides your doctor with information about the size and shape of your heart and how well your heart's chambers and valves are working. This procedure takes approximately one hour. There are no restrictions for this procedure.  Please work on your diet and try to increase your exercise in an effort to lose weight  Your physician wants you to follow-up in: 6 months You will receive a reminder letter in the mail two months in advance.  If you don't receive a letter, please call our office to schedule the follow-up appointment.  Your next appointment will be scheduled in our new office located at :  [place]  [address], [place]  [place],  [address]

## 2013-12-02 NOTE — Assessment & Plan Note (Addendum)
Bonnie Shaw presents with an episode of paroxysmal atrial fibrillation. She's had paroxysmal A. fib for the past 5 or 6 years. She's not having episodes until Wednesday night. She presented to the [facility] emergency room with palpitations and anxiety and was found to be in atrial flutter ablation. She converted to sinus rhythm in the emergency room.  She continues to be very anxious. She is maintained sinus rhythm since that time.  She's on metoprolol and her heart rate and blood pressure are well controlled today. We discussed the risk of thromboembolism.  Her CHADS-VASC score is 2.  We will start her on Eliquis 5 mg BID.    DC ASA We'll get an echocardiogram to assess her LV function and size.  Stressed to her the importance of diet, exercise, and weight loss program. If she would also benefit from being on some anxiety medications.    I will see her back in 6 months

## 2013-12-02 NOTE — Assessment & Plan Note (Signed)
Lots of issues with anxiety. I have given her the name of a psychologist that may help her Ezekiel Slocumb)

## 2013-12-05 NOTE — Assessment & Plan Note (Signed)
Stable,  Continue current meds

## 2013-12-09 ENCOUNTER — Other Ambulatory Visit (INDEPENDENT_AMBULATORY_CARE_PROVIDER_SITE_OTHER): Payer: 59

## 2013-12-09 ENCOUNTER — Other Ambulatory Visit: Payer: Self-pay

## 2013-12-09 DIAGNOSIS — I480 Paroxysmal atrial fibrillation: Secondary | ICD-10-CM

## 2013-12-09 DIAGNOSIS — I4891 Unspecified atrial fibrillation: Secondary | ICD-10-CM

## 2014-01-02 ENCOUNTER — Encounter: Payer: Self-pay | Admitting: Internal Medicine

## 2014-01-02 ENCOUNTER — Other Ambulatory Visit: Payer: Self-pay

## 2014-01-02 ENCOUNTER — Other Ambulatory Visit: Payer: Self-pay | Admitting: *Deleted

## 2014-01-02 DIAGNOSIS — E785 Hyperlipidemia, unspecified: Secondary | ICD-10-CM

## 2014-01-02 DIAGNOSIS — E039 Hypothyroidism, unspecified: Secondary | ICD-10-CM

## 2014-01-02 MED ORDER — APIXABAN 5 MG PO TABS: 5.0000 mg | ORAL_TABLET | Freq: Two times a day (BID) | ORAL | Status: DC

## 2014-01-02 MED ORDER — POTASSIUM CHLORIDE ER 10 MEQ PO TBCR: 10.0000 meq | EXTENDED_RELEASE_TABLET | Freq: Every day | ORAL | Status: DC

## 2014-01-02 MED ORDER — ATORVASTATIN CALCIUM 20 MG PO TABS: 20.0000 mg | ORAL_TABLET | Freq: Every day | ORAL | Status: DC

## 2014-01-02 MED ORDER — LEVOTHYROXINE SODIUM 50 MCG PO TABS: 50.0000 ug | ORAL_TABLET | Freq: Every day | ORAL | Status: DC

## 2014-01-02 MED ORDER — METOPROLOL SUCCINATE ER 100 MG PO TB24: 100.0000 mg | ORAL_TABLET | Freq: Every day | ORAL | Status: DC

## 2014-03-13 ENCOUNTER — Other Ambulatory Visit: Payer: Self-pay | Admitting: Internal Medicine

## 2014-05-04 ENCOUNTER — Other Ambulatory Visit: Payer: Self-pay | Admitting: *Deleted

## 2014-05-04 ENCOUNTER — Other Ambulatory Visit: Payer: Self-pay | Admitting: Internal Medicine

## 2014-05-11 ENCOUNTER — Encounter: Payer: Self-pay | Admitting: Internal Medicine

## 2014-05-11 ENCOUNTER — Ambulatory Visit (INDEPENDENT_AMBULATORY_CARE_PROVIDER_SITE_OTHER): Payer: 59 | Admitting: Internal Medicine

## 2014-05-11 VITALS — BP 116/74 | HR 66 | Temp 98.4°F | Ht 62.5 in | Wt 238.2 lb

## 2014-05-11 DIAGNOSIS — I10 Essential (primary) hypertension: Secondary | ICD-10-CM

## 2014-05-11 DIAGNOSIS — E039 Hypothyroidism, unspecified: Secondary | ICD-10-CM

## 2014-05-11 DIAGNOSIS — I480 Paroxysmal atrial fibrillation: Secondary | ICD-10-CM | POA: Diagnosis not present

## 2014-05-11 DIAGNOSIS — Z0000 Encounter for general adult medical examination without abnormal findings: Secondary | ICD-10-CM | POA: Diagnosis not present

## 2014-05-11 LAB — CBC WITH DIFFERENTIAL/PLATELET
BASOS ABS: 0 10*3/uL (ref 0.0–0.1)
Basophils Relative: 0.3 % (ref 0.0–3.0)
EOS ABS: 0.1 10*3/uL (ref 0.0–0.7)
Eosinophils Relative: 1.4 % (ref 0.0–5.0)
HEMATOCRIT: 43.6 % (ref 36.0–46.0)
HEMOGLOBIN: 15.1 g/dL — AB (ref 12.0–15.0)
LYMPHS ABS: 2.3 10*3/uL (ref 0.7–4.0)
Lymphocytes Relative: 25.3 % (ref 12.0–46.0)
MCHC: 34.6 g/dL (ref 30.0–36.0)
MCV: 91 fl (ref 78.0–100.0)
Monocytes Absolute: 0.7 10*3/uL (ref 0.1–1.0)
Monocytes Relative: 7.2 % (ref 3.0–12.0)
NEUTROS ABS: 6 10*3/uL (ref 1.4–7.7)
Neutrophils Relative %: 65.8 % (ref 43.0–77.0)
Platelets: 216 10*3/uL (ref 150.0–400.0)
RBC: 4.8 Mil/uL (ref 3.87–5.11)
RDW: 13.8 % (ref 11.5–15.5)
WBC: 9.1 10*3/uL (ref 4.0–10.5)

## 2014-05-11 LAB — COMPREHENSIVE METABOLIC PANEL
ALBUMIN: 4.1 g/dL (ref 3.5–5.2)
ALT: 26 U/L (ref 0–35)
AST: 20 U/L (ref 0–37)
Alkaline Phosphatase: 64 U/L (ref 39–117)
BILIRUBIN TOTAL: 1.1 mg/dL (ref 0.2–1.2)
BUN: 17 mg/dL (ref 6–23)
CO2: 29 mEq/L (ref 19–32)
Calcium: 9.9 mg/dL (ref 8.4–10.5)
Chloride: 104 mEq/L (ref 96–112)
Creatinine, Ser: 0.83 mg/dL (ref 0.40–1.20)
GFR: 73.41 mL/min (ref >60.00)
Glucose, Bld: 130 mg/dL — ABNORMAL HIGH (ref 70–99)
POTASSIUM: 3.7 meq/L (ref 3.5–5.1)
SODIUM: 140 meq/L (ref 135–145)
Total Protein: 7 g/dL (ref 6.0–8.3)

## 2014-05-11 LAB — T4, FREE: Free T4: 0.98 ng/dL (ref 0.60–1.60)

## 2014-05-11 LAB — LIPID PANEL
CHOL/HDL RATIO: 4
Cholesterol: 149 mg/dL (ref 0–200)
HDL: 42.2 mg/dL (ref >39.00)
LDL Cholesterol: 75 mg/dL (ref 0–99)
NonHDL: 106.8
TRIGLYCERIDES: 160 mg/dL — AB (ref 0.0–149.0)
VLDL: 32 mg/dL (ref 0.0–40.0)

## 2014-05-11 LAB — MICROALBUMIN / CREATININE URINE RATIO
Creatinine,U: 236.4 mg/dL
MICROALB UR: 1.4 mg/dL (ref 0.0–1.9)
MICROALB/CREAT RATIO: 0.6 mg/g (ref 0.0–30.0)

## 2014-05-11 LAB — VITAMIN D 25 HYDROXY (VIT D DEFICIENCY, FRACTURES): VITD: 22.94 ng/mL — ABNORMAL LOW (ref 30.00–100.00)

## 2014-05-11 LAB — TSH: TSH: 2.52 u[IU]/mL (ref 0.35–4.50)

## 2014-05-11 LAB — HEMOGLOBIN A1C: Hgb A1c MFr Bld: 5.7 % (ref 4.6–6.5)

## 2014-05-11 NOTE — Progress Notes (Signed)
Subjective:    Patient ID: Bonnie Shaw, female    DOB: [birth year], 64 y.o.   MRN: [id number]  HPI  64YO female presents for annual exam.  Diagnosed with afib since last visit. Continued on Metoprolol and added Eliquis. Having episodes of palpitations with afib sporadically about once every three days.  No clear trigger. Lasts only a few minutes, then resolves. No dyspnea, diaphoresis. Follow up with cardiology planned for this month.  Trying to exercise by riding bike. Has lost 7 lbs.  Wt Readings from Last 3 Encounters:  05/11/14 238 lb 4 oz (108.069 kg)  12/02/13 245 lb (111.131 kg)  11/09/13 247 lb 8 oz (112.265 kg)     Past medical, surgical, family and social history per today's encounter.  Review of Systems  Constitutional: Negative for fever, chills, appetite change, fatigue and unexpected weight change.  HENT: Positive for congestion and nosebleeds (small amount in mucous). Negative for postnasal drip, rhinorrhea, sinus pressure, sore throat and trouble swallowing.   Eyes: Negative for visual disturbance.  Respiratory: Negative for shortness of breath.   Cardiovascular: Positive for palpitations. Negative for chest pain and leg swelling.  Gastrointestinal: Negative for nausea, vomiting, abdominal pain, diarrhea and constipation.  Musculoskeletal: Negative for myalgias and arthralgias.  Skin: Negative for color change and rash.  Hematological: Negative for adenopathy. Does not bruise/bleed easily.  Psychiatric/Behavioral: Negative for sleep disturbance and dysphoric mood. The patient is not nervous/anxious.        Objective:    BP 116/74 mmHg  Pulse 66  Temp(Src) 98.4 F (36.9 C) (Oral)  Ht 5' 2.5" (1.588 m)  Wt 238 lb 4 oz (108.069 kg)  BMI 42.85 kg/m2  SpO2 96% Physical Exam  Constitutional: She is oriented to person, place, and time. She appears well-developed and well-nourished. No distress.  HENT:  Head: Normocephalic and atraumatic.  Right Ear:  External ear normal.  Left Ear: External ear normal.  Nose: Mucosal edema (left > right nare) present.  Mouth/Throat: Oropharynx is clear and moist. No oropharyngeal exudate.  Eyes: Conjunctivae are normal. Pupils are equal, round, and reactive to light. Right eye exhibits no discharge. Left eye exhibits no discharge. No scleral icterus.  Neck: Normal range of motion. Neck supple. No tracheal deviation present. No thyromegaly present.  Cardiovascular: Normal rate, regular rhythm, normal heart sounds and intact distal pulses.  Exam reveals no gallop and no friction rub.   No murmur heard. Pulmonary/Chest: Effort normal and breath sounds normal. No accessory muscle usage. No tachypnea. No respiratory distress. She has no decreased breath sounds. She has no wheezes. She has no rales. She exhibits no tenderness. Right breast exhibits no inverted nipple, no mass, no nipple discharge, no skin change and no tenderness. Left breast exhibits no inverted nipple, no mass, no nipple discharge, no skin change and no tenderness. Breasts are symmetrical.  Abdominal: Soft. Bowel sounds are normal. She exhibits no distension and no mass. There is no tenderness. There is no rebound and no guarding.  Musculoskeletal: Normal range of motion. She exhibits no edema or tenderness.  Lymphadenopathy:    She has no cervical adenopathy.  Neurological: She is alert and oriented to person, place, and time. No cranial nerve deficit. She exhibits normal muscle tone. Coordination normal.  Skin: Skin is warm and dry. No rash noted. She is not diaphoretic. No erythema. No pallor.  Psychiatric: She has a normal mood and affect. Her behavior is normal. Judgment and thought content normal.  Assessment & Plan:   Problem List Items Addressed This Visit      Unprioritized   Atrial fibrillation    NSR on exam today. Question if she might benefit from prn Diltiazem for episodes of afib. Follow up with cardiology planned  for later this month. Continue Metoprolol and Eliquis.      Hypertension    BP Readings from Last 3 Encounters:  05/11/14 116/74  12/02/13 130/90  11/09/13 140/70   BP well controlled. Continue current medication. Renal function with labs.      Hypothyroidism    Will check TSH with labs today. Continue Levothyroxine.      Relevant Orders   TSH   T4, free   Routine general medical examination at a health care facility - Primary    General medical exam including breast exam normal today. PAP and pelvic exam deferred as s/p hystectomy. Colonoscopy is due, but she prefers to delay. Consider Cologuard once covered by Medicare. Mammogram UTD and reviewed, next due 08/2014. Labs today including CBC, CMP, lipids, A1c, TSH. Encouraged her to look into coverage for Zostavax.      Relevant Orders   CBC with Differential/Platelet   Comprehensive metabolic panel   Lipid panel   Microalbumin / creatinine urine ratio   Vit D  25 hydroxy (rtn osteoporosis monitoring)   Severe obesity (BMI >= 40)    Wt Readings from Last 3 Encounters:  05/11/14 238 lb 4 oz (108.069 kg)  12/02/13 245 lb (111.131 kg)  11/09/13 247 lb 8 oz (112.265 kg)   Body mass index is 42.85 kg/(m^2). Encouraged healthy diet and exercise with goal of weight loss.      Relevant Orders   Hemoglobin A1c       Return in about 6 months (around 11/10/2014) for Recheck.

## 2014-05-11 NOTE — Assessment & Plan Note (Signed)
BP Readings from Last 3 Encounters:  05/11/14 116/74  12/02/13 130/90  11/09/13 140/70   BP well controlled. Continue current medication. Renal function with labs.

## 2014-05-11 NOTE — Progress Notes (Signed)
Pre visit review using our clinic review tool, if applicable. No additional management support is needed unless otherwise documented below in the visit note. 

## 2014-05-11 NOTE — Patient Instructions (Signed)

## 2014-05-11 NOTE — Assessment & Plan Note (Signed)
General medical exam including breast exam normal today. PAP and pelvic exam deferred as s/p hystectomy. Colonoscopy is due, but she prefers to delay. Consider Cologuard once covered by Medicare. Mammogram UTD and reviewed, next due 08/2014. Labs today including CBC, CMP, lipids, A1c, TSH. Encouraged her to look into coverage for Zostavax.

## 2014-05-11 NOTE — Assessment & Plan Note (Signed)
Will check TSH with labs today. Continue Levothyroxine. 

## 2014-05-11 NOTE — Assessment & Plan Note (Signed)
Wt Readings from Last 3 Encounters:  05/11/14 238 lb 4 oz (108.069 kg)  12/02/13 245 lb (111.131 kg)  11/09/13 247 lb 8 oz (112.265 kg)   Body mass index is 42.85 kg/(m^2). Encouraged healthy diet and exercise with goal of weight loss.

## 2014-05-11 NOTE — Assessment & Plan Note (Signed)
NSR on exam today. Question if she might benefit from prn Diltiazem for episodes of afib. Follow up with cardiology planned for later this month. Continue Metoprolol and Eliquis.

## 2014-05-23 NOTE — Op Note (Signed)
PATIENT NAME:  Bonnie Shaw, Bonnie Shaw MR#:  [id number] DATE OF BIRTH:  Dec 25, 1950  DATE OF PROCEDURE:  12/29/2011  PREOPERATIVE DIAGNOSIS: Cataract, right eye.   POSTOPERATIVE DIAGNOSIS: Cataract, right eye.   PROCEDURE PERFORMED: Extracapsular cataract extraction using phacoemulsification and placement of an Alcon [id number], 20.5-diopter posterior chamber lens, serial #[id number].   SURGEON: Loura Back. Remie Mathison, M.D.   ANESTHESIA: 4% lidocaine and 0.75% Marcaine in a 50-50 mixture with 10 units/mL of Hylenex added, given as a peribulbar.   ANESTHESIOLOGIST: Dr. Andree Elk.   COMPLICATIONS: None.   ESTIMATED BLOOD LOSS: Less than 1 mL.   DESCRIPTION OF PROCEDURE:  The patient was brought to the operating room and given a peribulbar block.  The patient was then prepped and draped in the usual fashion.  The vertical rectus muscles were imbricated using 5-0 silk sutures.  These sutures were then clamped to the sterile drapes as bridle sutures.  A limbal peritomy was performed extending two clock hours and hemostasis was obtained with cautery.  A partial thickness scleral groove was made at the surgical limbus and dissected anteriorly in a lamellar dissection using an Alcon crescent knife.  The anterior chamber was entered superonasally with a Superblade and through the lamellar dissection with a 2.6 mm keratome.  DisCoVisc was used to replace the aqueous and a continuous tear capsulorrhexis was carried out.  Hydrodissection and hydrodelineation were carried out with balanced salt and a 27 gauge canula.  The nucleus was rotated to confirm the effectiveness of the hydrodissection.  Phacoemulsification was carried out using a divide-and-conquer technique.  Total ultrasound time was 1 minute and 37.9 seconds with an average power of 23.2 percent.  CDE 34.8.  Irrigation/aspiration was used to remove the residual cortex.  DisCoVisc was used to inflate the capsule and the internal incision was enlarged to 3 mm with  the crescent knife.  The intraocular lens was folded and inserted into the capsular bag using the Acrysert delivery system.  Irrigation/aspiration was used to remove the residual DisCoVisc.  Miostat was injected into the anterior chamber through the paracentesis track to inflate the anterior chamber and induce miosis.  The wound was checked for leaks and none were found. The conjunctiva was closed with cautery and the bridle sutures were removed.  Two drops of 0.3% Vigamox were placed on the eye.   An eye shield was placed on the eye.  The patient was discharged to the recovery room in good condition.  ____________________________ Loura Back Elisa Kutner, MD sad:bjt D: 12/29/2011 14:37:55 ET T: 12/29/2011 16:04:17 ET JOB#: [id number]  cc: Remo Lipps A. Harold Mattes, MD, <Dictator> Martie Lee MD ELECTRONICALLY SIGNED 01/05/2012 13:11

## 2014-06-01 ENCOUNTER — Ambulatory Visit (INDEPENDENT_AMBULATORY_CARE_PROVIDER_SITE_OTHER): Payer: 59 | Admitting: Cardiovascular Disease

## 2014-06-01 ENCOUNTER — Encounter: Payer: Self-pay | Admitting: Cardiovascular Disease

## 2014-06-01 VITALS — BP 130/82 | HR 64 | Ht 63.0 in | Wt 238.0 lb

## 2014-06-01 DIAGNOSIS — I4891 Unspecified atrial fibrillation: Secondary | ICD-10-CM

## 2014-06-01 NOTE — Patient Instructions (Signed)
Medication Instructions:  Your physician recommends that you continue on your current medications as directed. Please refer to the Current Medication list given to you today.   Labwork: None   Testing/Procedures: None   Follow-Up: Your physician wants you to follow-up in: 6 months with Dr.Nahser You will receive a reminder letter in the mail two months in advance. If you don't receive a letter, please call our office to schedule the follow-up appointment.   Any Other Special Instructions Will Be Listed Below (If Applicable).

## 2014-06-01 NOTE — Progress Notes (Signed)
Cardiology Office Note   Date:  06/01/2014   ID:  Bonnie Shaw, Bonnie Shaw [birth year], MRN [id number]  PCP:  Bonnie Mast, MD  Cardiologist:   Bonnie Headings, MD   Chief Complaint  Patient presents with  . Atrial Fibrillation    6 month follow up. Pt. c/o irreg. heart beats. Meds reviewed by the patient verbally.    1. Atrial fib- paroxysmal 2. Hyperlipidemia 3. Hypertension 4. Anxiety 5. Hyperthyroidism-status post radioactive iodine ablation. She's now on thyroid replacement   History of Present Illness:  Bonnie Shaw has a hx of atrial fib ~ 4 years ago. Saw Dr. Haroldine Shaw. Was told that she had thyroid abnormalities - these typically resolved after treatment of her hyperthyroidism ( rradioactive iodine)   For the past 2 months, she has had more palpitations. The episodes have gradually worsened over the past several weeks.   She had a prolonged episode of atrial fibrillation on October 28. She presented to [facility] emergency room and was found to have atrial fibrillation with a rapid rate. She converted to sinus rhythm while she was in the ER. She was continued on her same home medications.  She has not had any recurrent episodes of atrial fibrillation since the 28th.  She rides an exercise bike 3-5 times a week for 30 minutes. Plays soccer with her grandchildren. Is also limited by her bilateral knee replacement.  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 female who presents for follow-up of her atrial fibrillation. She has had several episodes of AFib.  Last as long as 1 hour.   Does not cause any chest pain or dyspnea.    Exercising regularly   .  Rides stationary bike 4-5 times a week. Has lost 10 lbs over the past 6 months .     Past Medical History  Diagnosis Date  . Hyperlipidemia   . Hypertension   . Anxiety   . Palpitations   . Atrial fibrillation   .  Panic disorder   . Hyperthyroidism   . Cataract     Past Surgical History  Procedure Laterality Date  . Thyroid ablation  2010  . Cataract extraction    . Abdominal hysterectomy    . Knee surgery      left knee     Current Outpatient Prescriptions  Medication Sig Dispense Refill  . apixaban (ELIQUIS) 5 MG TABS tablet Take 1 tablet (5 mg total) by mouth 2 (two) times daily. 180 tablet 3  . atorvastatin (LIPITOR) 20 MG tablet Take 1 tablet (20 mg total) by mouth daily. 90 tablet 3  . hydrochlorothiazide (HYDRODIURIL) 25 MG tablet TAKE ONE (1) TABLET BY MOUTH EVERY DAY 90 tablet 1  . levothyroxine (SYNTHROID, LEVOTHROID) 50 MCG tablet Take 1 tablet by mouth  daily 90 tablet 2  . metoprolol succinate (TOPROL-XL) 100 MG 24 hr tablet Take 1 tablet (100 mg total) by mouth daily. 90 tablet 3  . Multiple Vitamin (MULTIVITAMIN) tablet Take 1 tablet by mouth daily.    . Omega-3 Fatty Acids (FISH OIL) 1000 MG CAPS Take 1,000 mg by mouth 3 (three) times daily.    . potassium chloride (K-DUR,KLOR-CON) 10 MEQ tablet Take 1 tablet by mouth  daily 90 tablet 2   No current facility-administered medications for this visit.    Allergies:   Sulfonamide derivatives  Social History:  The patient  reports that she has never smoked. She has never used smokeless tobacco. She reports that she does not drink alcohol or use illicit drugs.   Family History:  The patient's family history includes Alzheimer's disease in her father; Cancer in her sister; Stroke in her mother.    ROS:  Please see the history of present illness.    Review of Systems: Constitutional:  denies fever, chills, diaphoresis, appetite change and fatigue.  HEENT: denies photophobia, eye pain, redness, hearing loss, ear pain, congestion, sore throat, rhinorrhea, sneezing, neck pain, neck stiffness and tinnitus.  Respiratory: denies SOB, DOE, cough, chest tightness, and wheezing.  Cardiovascular: denies chest pain, palpitations and  leg swelling.  Gastrointestinal: denies nausea, vomiting, abdominal pain, diarrhea, constipation, blood in stool.  Genitourinary: denies dysuria, urgency, frequency, hematuria, flank pain and difficulty urinating.  Musculoskeletal: denies  myalgias, back pain, joint swelling, arthralgias and gait problem.   Skin: denies pallor, rash and wound.  Neurological: denies dizziness, seizures, syncope, weakness, light-headedness, numbness and headaches.   Hematological: denies adenopathy, easy bruising, personal or family bleeding history.  Psychiatric/ Behavioral: denies suicidal ideation, mood changes, confusion, nervousness, sleep disturbance and agitation.       All other systems are reviewed and negative.    PHYSICAL EXAM: VS:  BP 130/82 mmHg  Pulse 64  Ht 5\' 3"  (1.6 m)  Wt 238 lb (107.956 kg)  BMI 42.17 kg/m2 , BMI Body mass index is 42.17 kg/(m^2). GEN: Well nourished, well developed, in no acute distress HEENT: normal Neck: no JVD, carotid bruits, or masses Cardiac: RRR; no murmurs, rubs, or gallops,no edema  Respiratory:  clear to auscultation bilaterally, normal work of breathing GI: soft, nontender, nondistended, + BS MS: no deformity or atrophy Skin: warm and dry, no rash Neuro:  Strength and sensation are intact Psych: normal   EKG:  EKG is ordered today. The ekg ordered today demonstrates : NSR at 64.  Normal ECG   Recent Labs: 05/11/2014: ALT 26; BUN 17; Creatinine 0.83; Hemoglobin 15.1*; Platelets 216.0; Potassium 3.7; Sodium 140; TSH 2.52    Lipid Panel    Component Value Date/Time   CHOL 149 05/11/2014 0909   TRIG 160.0* 05/11/2014 0909   HDL 42.20 05/11/2014 0909   CHOLHDL 4 05/11/2014 0909   VLDL 32.0 05/11/2014 0909   LDLCALC 75 05/11/2014 0909   LDLDIRECT 111.0 02/20/2011 0850      Wt Readings from Last 3 Encounters:  06/01/14 238 lb (107.956 kg)  05/11/14 238 lb 4 oz (108.069 kg)  12/02/13 245 lb (111.131 kg)      Other studies  Reviewed: Additional studies/ records that were reviewed today include: . Review of the above records demonstrates:    ASSESSMENT AND PLAN:  1. Atrial fib- paroxysmal - she has occasional palpitations. She's on Eliquis. Continue same medications. 2. Hyperlipidemia - her lipids been well-controlled. She'll follow-up with her medical doctor.  3. Hypertension - blood pressure is well-controlled. Continue current medications.  4. Anxiety 5. Hyperthyroidism-status post radioactive iodine ablation. She's now on thyroid replacement    Current medicines are reviewed at length with the patient today.  The patient does not have concerns regarding medicines.  The following changes have been made:  no change  Labs/ tests ordered today include:  Orders Placed This Encounter  Procedures  . EKG 12-Lead     Disposition:   FU with me in 6 months      Sophronia Varney, Wonda Cheng, MD  06/01/2014  10:36 AM    [facility] [place], [place], [place]  [address] Phone: [phone]; Fax: [phone]   [facility]  [address] [place], [place]  [address] [phone]    Fax [phone]

## 2014-06-05 ENCOUNTER — Encounter: Payer: Self-pay | Admitting: Nurse Practitioner

## 2014-06-05 ENCOUNTER — Ambulatory Visit (INDEPENDENT_AMBULATORY_CARE_PROVIDER_SITE_OTHER): Payer: 59 | Admitting: Nurse Practitioner

## 2014-06-05 ENCOUNTER — Telehealth: Payer: Self-pay | Admitting: Internal Medicine

## 2014-06-05 ENCOUNTER — Other Ambulatory Visit: Payer: Self-pay | Admitting: *Deleted

## 2014-06-05 VITALS — BP 140/80 | HR 82 | Temp 98.1°F | Ht 63.0 in | Wt 237.5 lb

## 2014-06-05 DIAGNOSIS — B9689 Other specified bacterial agents as the cause of diseases classified elsewhere: Secondary | ICD-10-CM

## 2014-06-05 DIAGNOSIS — J019 Acute sinusitis, unspecified: Secondary | ICD-10-CM

## 2014-06-05 MED ORDER — AMOXICILLIN-POT CLAVULANATE 875-125 MG PO TABS: 1.0000 | ORAL_TABLET | Freq: Two times a day (BID) | ORAL | Status: DC

## 2014-06-05 MED ORDER — HYDROCOD POLST-CPM POLST ER 10-8 MG/5ML PO SUER: 5.0000 mL | Freq: Every evening | ORAL | Status: DC | PRN

## 2014-06-05 NOTE — Assessment & Plan Note (Signed)
Due to severity of symptoms and length of beginning symptoms (2 weeks) will try Augmentin twice daily for 5 days. Encouraged probiotic, rest, hydration w/ water, and gave script for tussionex. FU prn worsening/failure to improve.

## 2014-06-05 NOTE — Patient Instructions (Addendum)
Take antibiotics as recommended.   Please take a probiotic ( Align, Floraque or Culturelle) while you are on the antibiotic to prevent a serious antibiotic associated diarrhea  Called clostirudium dificile colitis and a vaginal yeast infection.   Rest, hydrate with water, continue mucinex.   Call us if not improving in 7 days.

## 2014-06-05 NOTE — Progress Notes (Signed)
Pre visit review using our clinic review tool, if applicable. No additional management support is needed unless otherwise documented below in the visit note. 

## 2014-06-05 NOTE — Progress Notes (Signed)
   Subjective:    Patient ID: NORITA MEIGS, female    DOB: May 25, 1950, 64 y.o.   MRN: [id number]  HPI  Ms. Faught is a 64 yo female with a CC of congestion x 4 days.   1) Coughing productive- green started Saturday Nasal congestion Coughing worse at night  Started as allergy signs and symptoms until Friday.  Treatment to date: Mucinex- Helpful  Robitussin- helped at night  Gargling with alkolol- helpful   Review of Systems  Constitutional: Positive for chills, diaphoresis, appetite change and fatigue. Negative for fever.       Poor appetite  HENT: Positive for congestion, postnasal drip, rhinorrhea and sore throat. Negative for ear discharge, ear pain and sinus pressure.   Eyes: Negative for visual disturbance.  Respiratory: Positive for cough. Negative for chest tightness, shortness of breath and wheezing.   Cardiovascular: Negative for chest pain, palpitations and leg swelling.  Gastrointestinal: Negative for nausea, vomiting and diarrhea.  Skin: Negative for rash.  Neurological: Positive for headaches. Negative for dizziness, weakness and numbness.       Sat.      Objective:   Physical Exam  Constitutional: She is oriented to person, place, and time. She appears well-developed and well-nourished. No distress.  BP 140/80 mmHg  Pulse 82  Temp(Src) 98.1 F (36.7 C) (Oral)  Ht 5\' 3"  (1.6 m)  Wt 237 lb 8 oz (107.729 kg)  BMI 42.08 kg/m2  SpO2 97%   HENT:  Head: Normocephalic and atraumatic.  Right Ear: External ear normal.  Left Ear: External ear normal.  TM's clear bilaterally Pt brought in sample of mucous, which was very dark green and thick  Eyes: EOM are normal. Pupils are equal, round, and reactive to light. Right eye exhibits no discharge. Left eye exhibits no discharge. No scleral icterus.  Neck: Normal range of motion. Neck supple. No thyromegaly present.  Cardiovascular: Normal rate and regular rhythm.   Pulmonary/Chest: Effort normal and breath sounds  normal. No respiratory distress. She has no wheezes. She has no rales. She exhibits no tenderness.  Lymphadenopathy:    She has no cervical adenopathy.  Neurological: She is alert and oriented to person, place, and time. No cranial nerve deficit. She exhibits normal muscle tone. Coordination normal.  Skin: Skin is warm and dry. No rash noted. She is not diaphoretic.  Psychiatric: She has a normal mood and affect. Her behavior is normal. Judgment and thought content normal.      Assessment & Plan:

## 2014-06-05 NOTE — Telephone Encounter (Signed)
Pharmacist from [facility] called states the Augmentin Rx would not go through the insurance processing as it had already been filled somewhere else.  I called pts mailorder pharmacy to cancel Rx.  [employer] aware to reprocess Rx.

## 2014-06-06 ENCOUNTER — Ambulatory Visit: Payer: 59 | Admitting: Nurse Practitioner

## 2014-08-22 ENCOUNTER — Other Ambulatory Visit: Payer: Self-pay | Admitting: Internal Medicine

## 2014-08-22 DIAGNOSIS — Z1231 Encounter for screening mammogram for malignant neoplasm of breast: Secondary | ICD-10-CM

## 2014-09-06 ENCOUNTER — Ambulatory Visit
Admission: RE | Admit: 2014-09-06 | Discharge: 2014-09-06 | Disposition: A | Discharge status: Home / Self Care | Payer: 59 | Source: Ambulatory Visit | Attending: Internal Medicine | Admitting: Internal Medicine

## 2014-09-06 DIAGNOSIS — Z1231 Encounter for screening mammogram for malignant neoplasm of breast: Secondary | ICD-10-CM | POA: Diagnosis present

## 2014-11-13 ENCOUNTER — Encounter: Payer: Self-pay | Admitting: Cardiovascular Disease

## 2014-11-13 ENCOUNTER — Ambulatory Visit (INDEPENDENT_AMBULATORY_CARE_PROVIDER_SITE_OTHER): Payer: 59 | Admitting: Cardiovascular Disease

## 2014-11-13 VITALS — BP 152/80 | HR 87 | Ht 63.0 in | Wt 241.2 lb

## 2014-11-13 DIAGNOSIS — I4891 Unspecified atrial fibrillation: Secondary | ICD-10-CM | POA: Diagnosis not present

## 2014-11-13 NOTE — Patient Instructions (Signed)
Medication Instructions:  Your physician recommends that you continue on your current medications as directed. Please refer to the Current Medication list given to you today.   Labwork: none  Testing/Procedures: none  Follow-Up: Your physician wants you to follow-up in: one year with Dr. Nahser. You will receive a reminder letter in the mail two months in advance. If you don't receive a letter, please call our office to schedule the follow-up appointment.   Any Other Special Instructions Will Be Listed Below (If Applicable).   

## 2014-11-13 NOTE — Progress Notes (Signed)
Cardiology Office Note   Date:  11/13/2014   ID:  Bonnie Shaw, Bonnie Shaw 11/13/1950, MRN [id number]  PCP:  Rica Mast, MD  Cardiologist:   Thayer Headings, MD   Chief Complaint  Patient presents with  . other    6 month follow up. Meds reviewed by the patient verbally. Pt. c/o a-Fib.    1. Atrial fib- paroxysmal 2. Hyperlipidemia 3. Hypertension 4. Anxiety 5. Hyperthyroidism-status post radioactive iodine ablation. She's now on thyroid replacement   History of Present Illness:  Bonnie Shaw has a hx of atrial fib ~ 4 years ago. Saw Dr. Haroldine Laws. Was told that she had thyroid abnormalities - these typically resolved after treatment of her hyperthyroidism ( rradioactive iodine)   For the past 2 months, she has had more palpitations. The episodes have gradually worsened over the past several weeks.   She had a prolonged episode of atrial fibrillation on October 28. She presented to [facility] emergency room and was found to have atrial fibrillation with a rapid rate. She converted to sinus rhythm while she was in the ER. She was continued on her same home medications.  She has not had any recurrent episodes of atrial fibrillation since the 28th.  She rides an exercise bike 3-5 times a week for 30 minutes. Plays soccer with her grandchildren. Is also limited by her bilateral knee replacement.  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 female who presents for follow-up of her atrial fibrillation. She has had several episodes of AFib.  Last as long as 1 hour.   Does not cause any chest pain or dyspnea.    Exercising regularly   .  Rides stationary bike 4-5 times a week. Has lost 10 lbs over the past 6 months .    Oct. 10, 2016:  Bonnie Shaw is seen today for follow up of her atrial fib.  Rough summer because of the heat. Riding a stationary bike now.  Has occasional chest  flutter  - no severe episodes recently .  In NSR today by ECG   Past Medical History  Diagnosis Date  . Hyperlipidemia   . Hypertension   . Anxiety   . Palpitations   . Atrial fibrillation ([place])   . Panic disorder   . Hyperthyroidism   . Cataract     Past Surgical History  Procedure Laterality Date  . Thyroid ablation  2010  . Cataract extraction    . Abdominal hysterectomy    . Knee surgery      left knee     Current Outpatient Prescriptions  Medication Sig Dispense Refill  . apixaban (ELIQUIS) 5 MG TABS tablet Take 1 tablet (5 mg total) by mouth 2 (two) times daily. 180 tablet 3  . atorvastatin (LIPITOR) 20 MG tablet Take 1 tablet (20 mg total) by mouth daily. 90 tablet 3  . hydrochlorothiazide (HYDRODIURIL) 25 MG tablet TAKE ONE (1) TABLET BY MOUTH EVERY DAY 90 tablet 1  . levothyroxine (SYNTHROID, LEVOTHROID) 50 MCG tablet Take 1 tablet by mouth  daily 90 tablet 2  . metoprolol succinate (TOPROL-XL) 100 MG 24 hr tablet Take 1 tablet (100 mg total) by mouth daily. 90 tablet 3  . Multiple Vitamin (MULTIVITAMIN) tablet Take 1 tablet by mouth daily.    . Omega-3 Fatty Acids (FISH OIL) 1000 MG CAPS Take 1,000 mg by mouth  3 (three) times daily.    . potassium chloride (K-DUR,KLOR-CON) 10 MEQ tablet Take 1 tablet by mouth  daily 90 tablet 2   No current facility-administered medications for this visit.    Allergies:   Sulfonamide derivatives    Social History:  The patient  reports that she has never smoked. She has never used smokeless tobacco. She reports that she does not drink alcohol or use illicit drugs.   Family History:  The patient's family history includes Alzheimer's disease in her father; Cancer in her sister; Ovarian cancer (age of onset: 17) in her sister; Stroke in her mother.    ROS:  Please see the history of present illness.    Review of Systems: Constitutional:  denies fever, chills, diaphoresis, appetite change and fatigue.  HEENT: denies  photophobia, eye pain, redness, hearing loss, ear pain, congestion, sore throat, rhinorrhea, sneezing, neck pain, neck stiffness and tinnitus.  Respiratory: denies SOB, DOE, cough, chest tightness, and wheezing.  Cardiovascular: denies chest pain, palpitations and leg swelling.  Gastrointestinal: denies nausea, vomiting, abdominal pain, diarrhea, constipation, blood in stool.  Genitourinary: denies dysuria, urgency, frequency, hematuria, flank pain and difficulty urinating.  Musculoskeletal: denies  myalgias, back pain, joint swelling, arthralgias and gait problem.   Skin: denies pallor, rash and wound.  Neurological: denies dizziness, seizures, syncope, weakness, light-headedness, numbness and headaches.   Hematological: denies adenopathy, easy bruising, personal or family bleeding history.  Psychiatric/ Behavioral: denies suicidal ideation, mood changes, confusion, nervousness, sleep disturbance and agitation.       All other systems are reviewed and negative.    PHYSICAL EXAM: VS:  BP 152/80 mmHg  Pulse 87  Ht 5\' 3"  (1.6 m)  Wt 109.43 kg (241 lb 4 oz)  BMI 42.75 kg/m2 , BMI Body mass index is 42.75 kg/(m^2). GEN: Well nourished, well developed, in no acute distress HEENT: normal Neck: no JVD, carotid bruits, or masses Cardiac: RRR; no murmurs, rubs, or gallops,no edema  Respiratory:  clear to auscultation bilaterally, normal work of breathing GI: soft, nontender, nondistended, + BS MS: no deformity or atrophy Skin: warm and dry, no rash Neuro:  Strength and sensation are intact Psych: normal   EKG:  EKG is ordered today. The ekg ordered today demonstrates : NSR at 87.  Normal ECG  Recent Labs: 05/11/2014: ALT 26; BUN 17; Creatinine, Ser 0.83; Hemoglobin 15.1*; Platelets 216.0; Potassium 3.7; Sodium 140; TSH 2.52    Lipid Panel    Component Value Date/Time   CHOL 149 05/11/2014 0909   TRIG 160.0* 05/11/2014 0909   HDL 42.20 05/11/2014 0909   CHOLHDL 4 05/11/2014 0909     VLDL 32.0 05/11/2014 0909   LDLCALC 75 05/11/2014 0909   LDLDIRECT 111.0 02/20/2011 0850      Wt Readings from Last 3 Encounters:  11/13/14 109.43 kg (241 lb 4 oz)  06/05/14 107.729 kg (237 lb 8 oz)  06/01/14 107.956 kg (238 lb)      Other studies Reviewed: Additional studies/ records that were reviewed today include: . Review of the above records demonstrates:    ASSESSMENT AND PLAN:  1. Atrial fib- paroxysmal - she has occasional palpitations. She's on Eliquis. Continue same medications. We discussed the fact that I will be transitioning  back to [facility].  She would like to follow up with Dr. Dorene Ar here in the [place] office.  2. Hyperlipidemia - her lipids been well-controlled. She'll follow-up with her medical doctor.  3. Hypertension - blood pressure is well-controlled. Continue current medications.  4. Anxiety 5. Hyperthyroidism-status post radioactive iodine ablation. She's now on thyroid replacement    Current medicines are reviewed at length with the patient today.  The patient does not have concerns regarding medicines.  The following changes have been made:  no change  Labs/ tests ordered today include:   Orders Placed This Encounter  Procedures  . EKG 12-Lead     Disposition:   FU with [place] in 1 year .    Nahser, Wonda Cheng, MD  11/13/2014 3:28 PM    [facility] [place], [place], [place]  [address] Phone: [phone]; Fax: [phone]   [facility]  [address] [place], [place]  [address] [phone]    Fax [phone]

## 2014-11-14 ENCOUNTER — Other Ambulatory Visit: Payer: Self-pay | Admitting: Internal Medicine

## 2014-11-16 ENCOUNTER — Other Ambulatory Visit: Payer: Self-pay

## 2014-11-16 MED ORDER — APIXABAN 5 MG PO TABS: 5.0000 mg | ORAL_TABLET | Freq: Two times a day (BID) | ORAL | Status: DC

## 2014-11-16 NOTE — Telephone Encounter (Signed)
Needs 1 yr supply

## 2014-12-18 ENCOUNTER — Encounter: Payer: Self-pay | Admitting: Internal Medicine

## 2014-12-18 ENCOUNTER — Ambulatory Visit (INDEPENDENT_AMBULATORY_CARE_PROVIDER_SITE_OTHER): Payer: 59 | Admitting: Internal Medicine

## 2014-12-18 VITALS — BP 123/79 | HR 86 | Temp 98.6°F | Ht 63.0 in | Wt 241.2 lb

## 2014-12-18 DIAGNOSIS — I480 Paroxysmal atrial fibrillation: Secondary | ICD-10-CM

## 2014-12-18 DIAGNOSIS — I10 Essential (primary) hypertension: Secondary | ICD-10-CM | POA: Diagnosis not present

## 2014-12-18 DIAGNOSIS — Z23 Encounter for immunization: Secondary | ICD-10-CM | POA: Diagnosis not present

## 2014-12-18 DIAGNOSIS — E039 Hypothyroidism, unspecified: Secondary | ICD-10-CM | POA: Diagnosis not present

## 2014-12-18 MED ORDER — LEVOTHYROXINE SODIUM 50 MCG PO TABS: ORAL_TABLET | ORAL | Status: DC

## 2014-12-18 NOTE — Assessment & Plan Note (Signed)
BP Readings from Last 3 Encounters:  12/18/14 123/79  11/13/14 152/80  06/05/14 140/80   BP well controlled. Renal function with labs. Continue current medication.

## 2014-12-18 NOTE — Assessment & Plan Note (Signed)
Will check TSH with labs. Continue Synthroid.

## 2014-12-18 NOTE — Progress Notes (Signed)
Subjective:    Patient ID: BRENDALIS Shaw, female    DOB: [birth year], 64 y.o.   MRN: [id number]  HPI  64YO female presents for follow up.  Feeling well. She is upset today about recent medical bills for her physical exam and acute issues addressed.  Atrial Fibrillation - Recently seen by cardiology. No changes made to medications. Episodes of palpitations typically occur every 6 months. She has opted not to use prn medications to help with acute episodes.  Obesity - Not following any particular diet. Exercising by riding a stationary bike 52min or 86miles daily.  HTN - Does not check BP at home. No chest pain, palpitations, HA.  Notes some occasional fatigue. Others report that she snores at home. She has never been evaluated for sleep apnea.     Wt Readings from Last 3 Encounters:  12/18/14 241 lb 4 oz (109.43 kg)  11/13/14 241 lb 4 oz (109.43 kg)  06/05/14 237 lb 8 oz (107.729 kg)   BP Readings from Last 3 Encounters:  12/18/14 123/79  11/13/14 152/80  06/05/14 140/80    Past Medical History  Diagnosis Date  . Hyperlipidemia   . Hypertension   . Anxiety   . Palpitations   . Atrial fibrillation ([place])   . Panic disorder   . Hyperthyroidism   . Cataract    Family History  Problem Relation Age of Onset  . Alzheimer's disease Father   . Stroke Mother   . Cancer Sister     ovarian   . Ovarian cancer Sister 62   Past Surgical History  Procedure Laterality Date  . Thyroid ablation  2010  . Cataract extraction    . Abdominal hysterectomy    . Knee surgery      left knee   Social History   Social History  . Marital Status: Married    Spouse Name: N/A  . Number of Children: 4  . Years of Education: N/A   Occupational History  .     Social History Main Topics  . Smoking status: Never Smoker   . Smokeless tobacco: Never Used  . Alcohol Use: No  . Drug Use: No  . Sexual Activity: Not Asked   Other Topics Concern  . 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  Eyes: Negative for visual disturbance.  Respiratory: Negative for shortness of breath.   Cardiovascular: Negative for chest pain, palpitations and leg swelling.  Gastrointestinal: Negative for nausea, vomiting, abdominal pain, diarrhea and constipation.  Musculoskeletal: Negative for myalgias and arthralgias.  Skin: Negative for color change and rash.  Hematological: Negative for adenopathy. Does not bruise/bleed easily.  Psychiatric/Behavioral: Negative for sleep disturbance and dysphoric mood. The patient is not nervous/anxious.        Objective:    BP 123/79 mmHg  Pulse 86  Temp(Src) 98.6 F (37 C) (Oral)  Ht 5\' 3"  (1.6 m)  Wt 241 lb 4 oz (109.43 kg)  BMI 42.75 kg/m2  SpO2 95% Physical Exam  Constitutional: She is oriented to person, place, and time. She appears well-developed and well-nourished. No distress.  HENT:  Head: Normocephalic and atraumatic.  Right Ear: External ear normal.  Left Ear: External ear normal.  Nose: Nose normal.  Mouth/Throat: Oropharynx is clear and moist. No oropharyngeal exudate.  Eyes: Conjunctivae are normal. Pupils are equal, round, and reactive to light. Right eye exhibits no discharge. Left eye exhibits no discharge. No scleral icterus.  Neck: Normal range of motion. Neck supple. No tracheal deviation present. No thyromegaly present.  Cardiovascular: Normal rate, regular rhythm, normal heart sounds and intact distal pulses.  Exam reveals no gallop and no friction rub.   No murmur heard. Pulmonary/Chest: Effort normal and breath sounds normal. No respiratory distress. She has no wheezes. She has no rales. She exhibits no tenderness.  Musculoskeletal: Normal range of motion. She exhibits no edema or  tenderness.  Lymphadenopathy:    She has no cervical adenopathy.  Neurological: She is alert and oriented to person, place, and time. No cranial nerve deficit. She exhibits normal muscle tone. Coordination normal.  Skin: Skin is warm and dry. No rash noted. She is not diaphoretic. No erythema. No pallor.  Psychiatric: She has a normal mood and affect. Her behavior is normal. Judgment and thought content normal.          Assessment & Plan:   Problem List Items Addressed This Visit      Unprioritized   Atrial fibrillation ([place])    Rate well controlled. Anticoagulated with Eliquis. Continue follow up with cardiology.      Relevant Orders   CBC with Differential/Platelet   Hypertension - Primary    BP Readings from Last 3 Encounters:  12/18/14 123/79  11/13/14 152/80  06/05/14 140/80   BP well controlled. Renal function with labs. Continue current medication.      Relevant Orders   Comprehensive metabolic panel   Hypothyroidism    Will check TSH with labs. Continue Synthroid.      Relevant Medications   levothyroxine (SYNTHROID, LEVOTHROID) 50 MCG tablet   Other Relevant Orders   T4, free   Severe obesity (BMI >= 40) (HCC)    Wt Readings from Last 3 Encounters:  12/18/14 241 lb 4 oz (109.43 kg)  11/13/14 241 lb 4 oz (109.43 kg)  06/05/14 237 lb 8 oz (107.729 kg)   Body mass index is 42.75 kg/(m^2). The patient is asked to make an attempt to improve diet and exercise patterns to aid in medical management of this problem. Encouraged her to consider testing for sleep apnea given morbid obesity, fatigue and atrial fibrillation. She declines.       Relevant Orders   Hemoglobin A1c   VITAMIN D 25 Hydroxy (Vit-D Deficiency, Fractures)   TSH       Return in about 6 months (around 06/17/2015) for Wellness Visit.

## 2014-12-18 NOTE — Patient Instructions (Addendum)
Labs today.  Flu vaccine and Prevnar vaccine today.  Consider sleep study in the future.  Follow up in 6 months for Wellness Visit.

## 2014-12-18 NOTE — Assessment & Plan Note (Signed)
Rate well controlled. Anticoagulated with Eliquis. Continue follow up with cardiology.

## 2014-12-18 NOTE — Assessment & Plan Note (Addendum)
Wt Readings from Last 3 Encounters:  12/18/14 241 lb 4 oz (109.43 kg)  11/13/14 241 lb 4 oz (109.43 kg)  06/05/14 237 lb 8 oz (107.729 kg)   Body mass index is 42.75 kg/(m^2). The patient is asked to make an attempt to improve diet and exercise patterns to aid in medical management of this problem. Encouraged her to consider testing for sleep apnea given morbid obesity, fatigue and atrial fibrillation. She declines.

## 2014-12-18 NOTE — Addendum Note (Signed)
Addended by: Vernetta Honey on: 12/18/2014 04:30 PM   Modules accepted: Orders

## 2014-12-19 LAB — COMPREHENSIVE METABOLIC PANEL
ALBUMIN: 4.2 g/dL (ref 3.5–5.2)
ALK PHOS: 58 U/L (ref 39–117)
ALT: 27 U/L (ref 0–35)
AST: 21 U/L (ref 0–37)
BILIRUBIN TOTAL: 1 mg/dL (ref 0.2–1.2)
BUN: 12 mg/dL (ref 6–23)
CO2: 28 mEq/L (ref 19–32)
Calcium: 9.9 mg/dL (ref 8.4–10.5)
Chloride: 105 mEq/L (ref 96–112)
Creatinine, Ser: 0.78 mg/dL (ref 0.40–1.20)
GFR: 78.72 mL/min (ref >60.00)
Glucose, Bld: 97 mg/dL (ref 70–99)
POTASSIUM: 3.9 meq/L (ref 3.5–5.1)
SODIUM: 142 meq/L (ref 135–145)
TOTAL PROTEIN: 6.9 g/dL (ref 6.0–8.3)

## 2014-12-19 LAB — CBC WITH DIFFERENTIAL/PLATELET
BASOS ABS: 0 10*3/uL (ref 0.0–0.1)
Basophils Relative: 0.4 % (ref 0.0–3.0)
EOS ABS: 0.1 10*3/uL (ref 0.0–0.7)
Eosinophils Relative: 1.6 % (ref 0.0–5.0)
HCT: 43.8 % (ref 36.0–46.0)
Hemoglobin: 14.8 g/dL (ref 12.0–15.0)
LYMPHS ABS: 3.1 10*3/uL (ref 0.7–4.0)
Lymphocytes Relative: 35.2 % (ref 12.0–46.0)
MCHC: 33.8 g/dL (ref 30.0–36.0)
MCV: 93 fl (ref 78.0–100.0)
MONO ABS: 0.6 10*3/uL (ref 0.1–1.0)
MONOS PCT: 7.1 % (ref 3.0–12.0)
NEUTROS ABS: 4.9 10*3/uL (ref 1.4–7.7)
NEUTROS PCT: 55.7 % (ref 43.0–77.0)
PLATELETS: 195 10*3/uL (ref 150.0–400.0)
RBC: 4.71 Mil/uL (ref 3.87–5.11)
RDW: 13.4 % (ref 11.5–15.5)
WBC: 8.7 10*3/uL (ref 4.0–10.5)

## 2014-12-19 LAB — T4, FREE: FREE T4: 0.89 ng/dL (ref 0.60–1.60)

## 2014-12-19 LAB — HEMOGLOBIN A1C: HEMOGLOBIN A1C: 5.7 % (ref 4.6–6.5)

## 2014-12-19 LAB — TSH: TSH: 2.44 u[IU]/mL (ref 0.35–4.50)

## 2014-12-19 LAB — VITAMIN D 25 HYDROXY (VIT D DEFICIENCY, FRACTURES): VITD: 27.55 ng/mL — AB (ref 30.00–100.00)

## 2015-01-11 LAB — FECAL OCCULT BLOOD, GUAIAC: Fecal Occult Blood: NEGATIVE

## 2015-01-12 ENCOUNTER — Other Ambulatory Visit: Payer: Self-pay | Admitting: *Deleted

## 2015-01-12 MED ORDER — APIXABAN 5 MG PO TABS: 5.0000 mg | ORAL_TABLET | Freq: Two times a day (BID) | ORAL | Status: DC

## 2015-01-23 ENCOUNTER — Other Ambulatory Visit: Payer: Self-pay | Admitting: Internal Medicine

## 2015-01-29 ENCOUNTER — Other Ambulatory Visit: Payer: Self-pay | Admitting: Internal Medicine

## 2015-02-07 ENCOUNTER — Telehealth: Payer: Self-pay | Admitting: Internal Medicine

## 2015-02-07 ENCOUNTER — Telehealth: Payer: Self-pay | Admitting: *Deleted

## 2015-02-07 NOTE — Telephone Encounter (Signed)
Patient has requested a call for her colon cancer screening results. Please advise. She stated [facility] sent the results over on 01/07/15

## 2015-02-07 NOTE — Telephone Encounter (Signed)
I don't have any results on "colon cancer screening" from [place]?

## 2015-02-07 NOTE — Telephone Encounter (Signed)
Please advise, I don't see the results in the chart.  Thanks

## 2015-02-07 NOTE — Telephone Encounter (Signed)
Pt came into get her colon screening results. [facility] sent over to [place] on 01/07/15. Please put results in MyChart or call her tomorrow at home. Pt's phone is not working today.  Thank you.

## 2015-02-07 NOTE — Telephone Encounter (Signed)
I attempted to call the patient back to clarify, but number on the chart is no longer in service.

## 2015-02-08 NOTE — Telephone Encounter (Signed)
Spoke with pt, Advised pt again that we do not have results.  Pt states that she called [facility] and got her results, she will bring [place] a copy for her chart.

## 2015-05-17 ENCOUNTER — Encounter: Payer: 59 | Admitting: Internal Medicine

## 2015-06-11 ENCOUNTER — Encounter: Payer: Self-pay | Admitting: Internal Medicine

## 2015-06-11 ENCOUNTER — Ambulatory Visit (INDEPENDENT_AMBULATORY_CARE_PROVIDER_SITE_OTHER): Payer: 59 | Admitting: Internal Medicine

## 2015-06-11 VITALS — BP 143/85 | HR 62 | Temp 98.2°F | Resp 14 | Ht 63.0 in | Wt 235.8 lb

## 2015-06-11 DIAGNOSIS — F419 Anxiety disorder, unspecified: Secondary | ICD-10-CM

## 2015-06-11 DIAGNOSIS — I480 Paroxysmal atrial fibrillation: Secondary | ICD-10-CM

## 2015-06-11 DIAGNOSIS — Z0001 Encounter for general adult medical examination with abnormal findings: Secondary | ICD-10-CM | POA: Diagnosis not present

## 2015-06-11 DIAGNOSIS — Z0000 Encounter for general adult medical examination without abnormal findings: Secondary | ICD-10-CM

## 2015-06-11 MED ORDER — SERTRALINE HCL 25 MG PO TABS: 25.0000 mg | ORAL_TABLET | Freq: Every day | ORAL | Status: DC

## 2015-06-11 NOTE — Assessment & Plan Note (Signed)
Rate well controlled. Anticoagulated with Eliquis. Encouraged follow up with Cardiology as scheduled.

## 2015-06-11 NOTE — Assessment & Plan Note (Signed)
General medical exam including breast exam normal today. Mammogram due in 09/2015. Labs today. FOBT was negative. Encouraged her to consider colonoscopy or Cologuard testing. Encouraged healthy diet and exercise. Plan for Pneumovax at next visit.

## 2015-06-11 NOTE — Assessment & Plan Note (Signed)
Wt Readings from Last 3 Encounters:  06/11/15 235 lb 12.8 oz (106.958 kg)  12/18/14 241 lb 4 oz (109.43 kg)  11/13/14 241 lb 4 oz (109.43 kg)   Body mass index is 41.78 kg/(m^2). Encouraged healthy diet and exercise.

## 2015-06-11 NOTE — Patient Instructions (Signed)
Start Sertraline 81m daily for 1 week, then increase to 51mdaily.  Follow up in 4 weeks.  Health Maintenance, Female Adopting a healthy lifestyle and getting preventive care can go a long way to promote health and wellness. Talk with your health care provider about what schedule of regular examinations is right for you. This is a good chance for you to check in with your provider about disease prevention and staying healthy. In between checkups, there are plenty of things you can do on your own. Experts have done a lot of research about which lifestyle changes and preventive measures are most likely to keep you healthy. Ask your health care provider for more information. WEIGHT AND DIET  Eat a healthy diet  Be sure to include plenty of vegetables, fruits, low-fat dairy products, and lean protein.  Do not eat a lot of foods high in solid fats, added sugars, or salt.  Get regular exercise. This is one of the most important things you can do for your health.  Most adults should exercise for at least 150 minutes each week. The exercise should increase your heart rate and make you sweat (moderate-intensity exercise).  Most adults should also do strengthening exercises at least twice a week. This is in addition to the moderate-intensity exercise.  Maintain a healthy weight  Body mass index (BMI) is a measurement that can be used to identify possible weight problems. It estimates body fat based on height and weight. Your health care provider can help determine your BMI and help you achieve or maintain a healthy weight.  For females 2010ears of age and older:   A BMI below 18.5 is considered underweight.  A BMI of 18.5 to 24.9 is normal.  A BMI of 25 to 29.9 is considered overweight.  A BMI of 30 and above is considered obese.  Watch levels of cholesterol and blood lipids  You should start having your blood tested for lipids and cholesterol at 2025ears of age, then have this test  every 5 years.  You may need to have your cholesterol levels checked more often if:  Your lipid or cholesterol levels are high.  You are older than 5063ears of age.  You are at high risk for heart disease.  CANCER SCREENING   Lung Cancer  Lung cancer screening is recommended for adults [id number] old who are at high risk for lung cancer because of a history of smoking.  A yearly low-dose CT scan of the lungs is recommended for people who:  Currently smoke.  Have quit within the past 15 years.  Have at least a 30-pack-year history of smoking. A pack year is smoking an average of one pack of cigarettes a day for 1 year.  Yearly screening should continue until it has been 15 years since you quit.  Yearly screening should stop if you develop a health problem that would prevent you from having lung cancer treatment.  Breast Cancer  Practice breast self-awareness. This means understanding how your breasts normally appear and feel.  It also means doing regular breast self-exams. Let your health care provider know about any changes, no matter how small.  If you are in your 20s or 30s, you should have a clinical breast exam (CBE) by a health care provider every 1-3 years as part of a regular health exam.  If you are 4068r older, have a CBE every year. Also consider having a breast X-ray (mammogram) every year.  If you  have a family history of breast cancer, talk to your health care provider about genetic screening.  If you are at high risk for breast cancer, talk to your health care provider about having an MRI and a mammogram every year.  Breast cancer gene (BRCA) assessment is recommended for women who have family members with BRCA-related cancers. BRCA-related cancers include:  Breast.  Ovarian.  Tubal.  Peritoneal cancers.  Results of the assessment will determine the need for genetic counseling and BRCA1 and BRCA2 testing. Cervical Cancer Your health care provider  may recommend that you be screened regularly for cancer of the pelvic organs (ovaries, uterus, and vagina). This screening involves a pelvic examination, including checking for microscopic changes to the surface of your cervix (Pap test). You may be encouraged to have this screening done every 3 years, beginning at age 54.  For women ages 69-65, health care providers may recommend pelvic exams and Pap testing every 3 years, or they may recommend the Pap and pelvic exam, combined with testing for human papilloma virus (HPV), every 5 years. Some types of HPV increase your risk of cervical cancer. Testing for HPV may also be done on women of any age with unclear Pap test results.  Other health care providers may not recommend any screening for nonpregnant women who are considered low risk for pelvic cancer and who do not have symptoms. Ask your health care provider if a screening pelvic exam is right for you.  If you have had past treatment for cervical cancer or a condition that could lead to cancer, you need Pap tests and screening for cancer for at least 20 years after your treatment. If Pap tests have been discontinued, your risk factors (such as having a new sexual partner) need to be reassessed to determine if screening should resume. Some women have medical problems that increase the chance of getting cervical cancer. In these cases, your health care provider may recommend more frequent screening and Pap tests. Colorectal Cancer  This type of cancer can be detected and often prevented.  Routine colorectal cancer screening usually begins at 66 years of age and continues through 66 years of age.  Your health care provider may recommend screening at an earlier age if you have risk factors for colon cancer.  Your health care provider may also recommend using home test kits to check for hidden blood in the stool.  A small camera at the end of a tube can be used to examine your colon directly  (sigmoidoscopy or colonoscopy). This is done to check for the earliest forms of colorectal cancer.  Routine screening usually begins at age 61.  Direct examination of the colon should be repeated every 5-10 years through 66 years of age. However, you may need to be screened more often if early forms of precancerous polyps or small growths are found. Skin Cancer  Check your skin from head to toe regularly.  Tell your health care provider about any new moles or changes in moles, especially if there is a change in a mole's shape or color.  Also tell your health care provider if you have a mole that is larger than the size of a pencil eraser.  Always use sunscreen. Apply sunscreen liberally and repeatedly throughout the day.  Protect yourself by wearing long sleeves, pants, a wide-brimmed hat, and sunglasses whenever you are outside. HEART DISEASE, DIABETES, AND HIGH BLOOD PRESSURE   High blood pressure causes heart disease and increases the risk of stroke.  High blood pressure is more likely to develop in:  People who have blood pressure in the high end of the normal range (130-139/85-89 mm Hg).  People who are overweight or obese.  People who are African American.  If you are 42-104 years of age, have your blood pressure checked every 3-5 years. If you are 60 years of age or older, have your blood pressure checked every year. You should have your blood pressure measured twice--once when you are at a hospital or clinic, and once when you are not at a hospital or clinic. Record the average of the two measurements. To check your blood pressure when you are not at a hospital or clinic, you can use:  An automated blood pressure machine at a pharmacy.  A home blood pressure monitor.  If you are between 18 years and 59 years old, ask your health care provider if you should take aspirin to prevent strokes.  Have regular diabetes screenings. This involves taking a blood sample to check your  fasting blood sugar level.  If you are at a normal weight and have a low risk for diabetes, have this test once every three years after 66 years of age.  If you are overweight and have a high risk for diabetes, consider being tested at a younger age or more often. PREVENTING INFECTION  Hepatitis B  If you have a higher risk for hepatitis B, you should be screened for this virus. You are considered at high risk for hepatitis B if:  You were born in a country where hepatitis B is common. Ask your health care provider which countries are considered high risk.  Your parents were born in a high-risk country, and you have not been immunized against hepatitis B (hepatitis B vaccine).  You have HIV or AIDS.  You use needles to inject street drugs.  You live with someone who has hepatitis B.  You have had sex with someone who has hepatitis B.  You get hemodialysis treatment.  You take certain medicines for conditions, including cancer, organ transplantation, and autoimmune conditions. Hepatitis C  Blood testing is recommended for:  Everyone born from [birth year] through 1965.  Anyone with known risk factors for hepatitis C. Sexually transmitted infections (STIs)  You should be screened for sexually transmitted infections (STIs) including gonorrhea and chlamydia if:  You are sexually active and are younger than 66 years of age.  You are older than 66 years of age and your health care provider tells you that you are at risk for this type of infection.  Your sexual activity has changed since you were last screened and you are at an increased risk for chlamydia or gonorrhea. Ask your health care provider if you are at risk.  If you do not have HIV, but are at risk, it may be recommended that you take a prescription medicine daily to prevent HIV infection. This is called pre-exposure prophylaxis (PrEP). You are considered at risk if:  You are sexually active and do not regularly use condoms or  know the HIV status of your partner(s).  You take drugs by injection.  You are sexually active with a partner who has HIV. Talk with your health care provider about whether you are at high risk of being infected with HIV. If you choose to begin PrEP, you should first be tested for HIV. You should then be tested every 3 months for as long as you are taking PrEP.  PREGNANCY   If you  are premenopausal and you may become pregnant, ask your health care provider about preconception counseling.  If you may become pregnant, take 400 to 800 micrograms (mcg) of folic acid every day.  If you want to prevent pregnancy, talk to your health care provider about birth control (contraception). OSTEOPOROSIS AND MENOPAUSE   Osteoporosis is a disease in which the bones lose minerals and strength with aging. This can result in serious bone fractures. Your risk for osteoporosis can be identified using a bone density scan.  If you are 38 years of age or older, or if you are at risk for osteoporosis and fractures, ask your health care provider if you should be screened.  Ask your health care provider whether you should take a calcium or vitamin D supplement to lower your risk for osteoporosis.  Menopause may have certain physical symptoms and risks.  Hormone replacement therapy may reduce some of these symptoms and risks. Talk to your health care provider about whether hormone replacement therapy is right for you.  HOME CARE INSTRUCTIONS   Schedule regular health, dental, and eye exams.  Stay current with your immunizations.   Do not use any tobacco products including cigarettes, chewing tobacco, or electronic cigarettes.  If you are pregnant, do not drink alcohol.  If you are breastfeeding, limit how much and how often you drink alcohol.  Limit alcohol intake to no more than 1 drink per day for nonpregnant women. One drink equals 12 ounces of beer, 5 ounces of wine, or 1 ounces of hard liquor.  Do  not use street drugs.  Do not share needles.  Ask your health care provider for help if you need support or information about quitting drugs.  Tell your health care provider if you often feel depressed.  Tell your health care provider if you have ever been abused or do not feel safe at home.   This information is not intended to replace advice given to you by your health care provider. Make sure you discuss any questions you have with your health care provider.   Document Released: 08/05/2010 Document Revised: 02/10/2014 Document Reviewed: 12/22/2012 Elsevier Interactive Patient Education [employer].

## 2015-06-11 NOTE — Assessment & Plan Note (Signed)
Generalized anxiety, much worse over last 6 months to year. Encouraged her to restart Sertraline as this had worked well for her in the past. Start Sertraline 25mg  daily, then increase to 50mg  daily after 1 week. Follow up in 4 weeks or sooner as needed.

## 2015-06-11 NOTE — Progress Notes (Signed)
Subjective:    Patient ID: Bonnie Shaw, female    DOB: [birth year], 65 y.o.   MRN: [id number]  HPI  65YO female presents for physical exam.  Recently had stool hemoccult testing which was negative.  Continues to have increased anxiety. Feels short of breath at times. Feels not like herself. Doesn't enjoy doing things she used to do. Husband and daughter have encouraged her to go back on medication. Previously on Sertraline with some improvement. No lightheadedness. Anxiety is worsened by feeling palpitations.  Wt Readings from Last 3 Encounters:  06/11/15 235 lb 12.8 oz (106.958 kg)  12/18/14 241 lb 4 oz (109.43 kg)  11/13/14 241 lb 4 oz (109.43 kg)   BP Readings from Last 3 Encounters:  06/11/15 143/85  12/18/14 123/79  11/13/14 152/80    Past Medical History  Diagnosis Date  . Hyperlipidemia   . Hypertension   . Anxiety   . Palpitations   . Atrial fibrillation ([place])   . Panic disorder   . Hyperthyroidism   . Cataract    Family History  Problem Relation Age of Onset  . Alzheimer's disease Father   . Stroke Mother   . Cancer Sister     ovarian   . Ovarian cancer Sister 36   Past Surgical History  Procedure Laterality Date  . Thyroid ablation  2010  . Cataract extraction    . Abdominal hysterectomy    . Knee surgery      left knee   Social History   Social History  . Marital Status: Married    Spouse Name: N/A  . Number of Children: 4  . Years of Education: N/A   Occupational History  .     Social History Main Topics  . Smoking status: Never Smoker   . Smokeless tobacco: Never Used  . Alcohol Use: No  . Drug Use: No  . Sexual Activity: Not Asked   Other Topics Concern  . 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  HENT: Negative for congestion, rhinorrhea, sinus pressure, sneezing, sore throat, trouble swallowing and voice change.   Eyes: Negative for visual disturbance.  Respiratory: Positive for shortness of breath. Negative for cough.   Cardiovascular: Positive for palpitations. Negative for chest pain and leg swelling.  Gastrointestinal: Negative for nausea, vomiting, abdominal pain, diarrhea and constipation.  Genitourinary: Negative for dysuria.  Musculoskeletal: Negative for myalgias and arthralgias.  Skin: Negative for color change and rash.  Neurological: Negative for weakness.  Hematological: Negative for adenopathy. Does not bruise/bleed easily.  Psychiatric/Behavioral: Positive for sleep disturbance, dysphoric mood and agitation. Negative for suicidal ideas and confusion. The patient is nervous/anxious.        Objective:    BP 143/85 mmHg  Pulse 62  Temp(Src) 98.2 F (36.8 C) (Oral)  Resp 14  Ht 5\' 3"  (1.6 m)  Wt 235 lb 12.8 oz (106.958 kg)  BMI 41.78 kg/m2  SpO2 98% Physical Exam  Constitutional: She is oriented to person, place, and time. She appears well-developed and well-nourished. No distress.  HENT:  Head: Normocephalic and atraumatic.  Right Ear: External ear normal.  Left Ear: External ear normal.  Nose: Nose normal.  Mouth/Throat: Oropharynx is clear and moist.  No oropharyngeal exudate.  Eyes: Conjunctivae are normal. Pupils are equal, round, and reactive to light. Right eye exhibits no discharge. Left eye exhibits no discharge. No scleral icterus.  Neck: Normal range of motion. Neck supple. No tracheal deviation present. No thyromegaly present.  Cardiovascular: Normal rate, regular rhythm, normal heart sounds and intact distal pulses.  Exam reveals no gallop and no friction rub.   No murmur heard. Pulmonary/Chest: Effort normal and breath sounds normal. No accessory muscle usage. No tachypnea. No respiratory distress. She has no decreased breath sounds. She  has no wheezes. She has no rales. She exhibits no tenderness. Right breast exhibits no inverted nipple, no mass, no nipple discharge, no skin change and no tenderness. Left breast exhibits no inverted nipple, no mass, no nipple discharge, no skin change and no tenderness. Breasts are symmetrical.  Abdominal: Soft. Bowel sounds are normal. She exhibits no distension and no mass. There is no tenderness. There is no rebound and no guarding.  Musculoskeletal: Normal range of motion. She exhibits no edema or tenderness.  Lymphadenopathy:    She has no cervical adenopathy.  Neurological: She is alert and oriented to person, place, and time. No cranial nerve deficit. She exhibits normal muscle tone. Coordination normal.  Skin: Skin is warm and dry. No rash noted. She is not diaphoretic. No erythema. No pallor.  Psychiatric: Judgment and thought content normal. Her mood appears anxious. Her speech is rapid and/or pressured. She is agitated. Cognition and memory are normal. She expresses no suicidal ideation.          Assessment & Plan:   Problem List Items Addressed This Visit      Unprioritized   Anxiety    Generalized anxiety, much worse over last 6 months to year. Encouraged her to restart Sertraline as this had worked well for her in the past. Start Sertraline 25mg  daily, then increase to 50mg  daily after 1 week. Follow up in 4 weeks or sooner as needed.      Relevant Medications   sertraline (ZOLOFT) 25 MG tablet   Atrial fibrillation (HCC)    Rate well controlled. Anticoagulated with Eliquis. Encouraged follow up with Cardiology as scheduled.      Routine general medical examination at a health care facility - Primary    General medical exam including breast exam normal today. Mammogram due in 09/2015. Labs today. FOBT was negative. Encouraged her to consider colonoscopy or Cologuard testing. Encouraged healthy diet and exercise. Plan for Pneumovax at next visit.      Relevant Orders    Comprehensive metabolic panel   Hemoglobin A1c   Lipid panel   Microalbumin / creatinine urine ratio   CBC with Differential/Platelet   TSH   Severe obesity (BMI >= 40) (HCC)    Wt Readings from Last 3 Encounters:  06/11/15 235 lb 12.8 oz (106.958 kg)  12/18/14 241 lb 4 oz (109.43 kg)  11/13/14 241 lb 4 oz (109.43 kg)   Body mass index is 41.78 kg/(m^2). Encouraged healthy diet and exercise.          Return in about 4 weeks (around 07/09/2015) for Recheck.  Ronette Deter, MD Internal Medicine [facility]

## 2015-06-12 LAB — LIPID PANEL
CHOL/HDL RATIO: 4
CHOLESTEROL: 169 mg/dL (ref 0–200)
HDL: 45.4 mg/dL (ref >39.00)
NonHDL: 123.41
Triglycerides: 229 mg/dL — ABNORMAL HIGH (ref 0.0–149.0)
VLDL: 45.8 mg/dL — AB (ref 0.0–40.0)

## 2015-06-12 LAB — CBC WITH DIFFERENTIAL/PLATELET
BASOS ABS: 0 10*3/uL (ref 0.0–0.1)
Basophils Relative: 0.4 % (ref 0.0–3.0)
EOS ABS: 0.2 10*3/uL (ref 0.0–0.7)
Eosinophils Relative: 1.6 % (ref 0.0–5.0)
HCT: 43.7 % (ref 36.0–46.0)
Hemoglobin: 14.9 g/dL (ref 12.0–15.0)
LYMPHS ABS: 3.2 10*3/uL (ref 0.7–4.0)
LYMPHS PCT: 34.9 % (ref 12.0–46.0)
MCHC: 34 g/dL (ref 30.0–36.0)
MCV: 92.2 fl (ref 78.0–100.0)
MONOS PCT: 6.7 % (ref 3.0–12.0)
Monocytes Absolute: 0.6 10*3/uL (ref 0.1–1.0)
NEUTROS ABS: 5.2 10*3/uL (ref 1.4–7.7)
NEUTROS PCT: 56.4 % (ref 43.0–77.0)
PLATELETS: 207 10*3/uL (ref 150.0–400.0)
RBC: 4.75 Mil/uL (ref 3.87–5.11)
RDW: 13.4 % (ref 11.5–15.5)
WBC: 9.2 10*3/uL (ref 4.0–10.5)

## 2015-06-12 LAB — COMPREHENSIVE METABOLIC PANEL
ALK PHOS: 61 U/L (ref 39–117)
ALT: 22 U/L (ref 0–35)
AST: 18 U/L (ref 0–37)
Albumin: 4.6 g/dL (ref 3.5–5.2)
BUN: 13 mg/dL (ref 6–23)
CHLORIDE: 100 meq/L (ref 96–112)
CO2: 31 mEq/L (ref 19–32)
Calcium: 9.9 mg/dL (ref 8.4–10.5)
Creatinine, Ser: 0.73 mg/dL (ref 0.40–1.20)
GFR: 84.85 mL/min (ref >60.00)
GLUCOSE: 99 mg/dL (ref 70–99)
POTASSIUM: 3.6 meq/L (ref 3.5–5.1)
SODIUM: 142 meq/L (ref 135–145)
TOTAL PROTEIN: 7 g/dL (ref 6.0–8.3)
Total Bilirubin: 1.2 mg/dL (ref 0.2–1.2)

## 2015-06-12 LAB — MICROALBUMIN / CREATININE URINE RATIO
CREATININE, U: 41.8 mg/dL
MICROALB/CREAT RATIO: 1.7 mg/g (ref 0.0–30.0)

## 2015-06-12 LAB — TSH: TSH: 1.98 u[IU]/mL (ref 0.35–4.50)

## 2015-06-12 LAB — HEMOGLOBIN A1C: HEMOGLOBIN A1C: 5.8 % (ref 4.6–6.5)

## 2015-06-12 LAB — LDL CHOLESTEROL, DIRECT: LDL DIRECT: 95 mg/dL

## 2015-07-12 ENCOUNTER — Encounter: Payer: Self-pay | Admitting: Internal Medicine

## 2015-07-12 ENCOUNTER — Ambulatory Visit (INDEPENDENT_AMBULATORY_CARE_PROVIDER_SITE_OTHER): Payer: 59 | Admitting: Internal Medicine

## 2015-07-12 VITALS — BP 120/60 | HR 67 | Temp 97.7°F | Wt 231.4 lb

## 2015-07-12 DIAGNOSIS — I480 Paroxysmal atrial fibrillation: Secondary | ICD-10-CM

## 2015-07-12 DIAGNOSIS — F419 Anxiety disorder, unspecified: Secondary | ICD-10-CM

## 2015-07-12 NOTE — Patient Instructions (Signed)
We will set up evaluation with Dr. Thompson Grayer in Cardiology.  Follow up here in 4 weeks.

## 2015-07-12 NOTE — Progress Notes (Addendum)
Subjective:    Patient ID: Bonnie Shaw, female    DOB: [birth year], 65 y.o.   MRN: [id number]  HPI 65YO female presents for follow up.  Last seen 5/8. Started on Sertraline to help with anxiety.  AFIB - having daily episodes of palpitations. Driving her crazy. Worsening anxiety. Feels short of breath at times. No chest pain. Some waves of lightheadedness. No clear triggers for afib.  Anxiety - Slight improvement with 50mg  dosing of Sertraline. However feels that afib is making anxiety worse.  Wt Readings from Last 3 Encounters:  07/12/15 231 lb 6.4 oz (104.962 kg)  06/11/15 235 lb 12.8 oz (106.958 kg)  12/18/14 241 lb 4 oz (109.43 kg)   BP Readings from Last 3 Encounters:  07/12/15 120/60  06/11/15 143/85  12/18/14 123/79    Past Medical History  Diagnosis Date  . Hyperlipidemia   . Hypertension   . Anxiety   . Palpitations   . Atrial fibrillation ([place])   . Panic disorder   . Hyperthyroidism   . Cataract    Family History  Problem Relation Age of Onset  . Alzheimer's disease Father   . Stroke Mother   . Cancer Sister     ovarian   . Ovarian cancer Sister 51   Past Surgical History  Procedure Laterality Date  . Thyroid ablation  2010  . Cataract extraction    . Abdominal hysterectomy    . Knee surgery      left knee   Social History   Social History  . Marital Status: Married    Spouse Name: N/A  . Number of Children: 4  . Years of Education: N/A   Occupational History  .     Social History Main Topics  . Smoking status: Never Smoker   . Smokeless tobacco: Never Used  . Alcohol Use: No  . Drug Use: No  . Sexual Activity: Not Asked   Other Topics Concern  . 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 Negative for fever, chills, appetite change and unexpected weight  change.  Eyes: Negative for visual disturbance.  Respiratory: Positive for shortness of breath. Negative for cough.   Cardiovascular: Positive for palpitations. Negative for chest pain and leg swelling.  Gastrointestinal: Negative for abdominal pain.  Skin: Negative for color change and rash.  Neurological: Positive for light-headedness.  Hematological: Negative for adenopathy. Does not bruise/bleed easily.  Psychiatric/Behavioral: Negative for sleep disturbance and dysphoric mood. The patient is nervous/anxious.        Objective:    BP 120/60 mmHg  Pulse 67  Temp(Src) 97.7 F (36.5 C)  Wt 231 lb 6.4 oz (104.962 kg)  SpO2 97% Physical Exam  Constitutional: She is oriented to person, place, and time. She appears well-developed and well-nourished. No distress.  HENT:  Head: Normocephalic and atraumatic.  Right Ear: External ear normal.  Left Ear: External ear normal.  Nose: Nose normal.  Mouth/Throat: Oropharynx is clear and moist. No oropharyngeal exudate.  Eyes: Conjunctivae are normal. Pupils are equal, round, and reactive to light. Right eye exhibits no discharge. Left eye exhibits no discharge. No scleral icterus.  Neck: Normal range of motion. Neck supple. No tracheal deviation present. No thyromegaly present.  Cardiovascular: Normal rate, normal heart sounds and  intact distal pulses.  An irregularly irregular rhythm present. Exam reveals no gallop and no friction rub.   No murmur heard. Pulmonary/Chest: Effort normal and breath sounds normal. No respiratory distress. She has no wheezes. She has no rales. She exhibits no tenderness.  Musculoskeletal: Normal range of motion. She exhibits no edema or tenderness.  Lymphadenopathy:    She has no cervical adenopathy.  Neurological: She is alert and oriented to person, place, and time. No cranial nerve deficit. She exhibits normal muscle tone. Coordination normal.  Skin: Skin is warm and dry. No rash noted. She is not diaphoretic.  No erythema. No pallor.  Psychiatric: Judgment and thought content normal. Her mood appears anxious. She is agitated.          Assessment & Plan:   Problem List Items Addressed This Visit      Unprioritized   Anxiety - Primary    Worsening anxiety. Recommended increasing Sertraline to 100mg , however she declines for now. Will continue 50mg  daily. Follow up in 4 weeks.      Atrial fibrillation ([place])    Reviewed AFIB and treatment. Pt is symptomatic with daily palpitations and worsening anxiety. Will set up evaluation with Dr. Rayann Heman. Question if she might be a candidate for additional medication or ablation. For now, continue Metoprolol and Eliquis.       Relevant Orders   Ambulatory referral to Cardiology       Return in about 4 weeks (around 08/09/2015) for Recheck.  Ronette Deter, MD Internal Medicine [facility]

## 2015-07-12 NOTE — Assessment & Plan Note (Signed)
Reviewed AFIB and treatment. Pt is symptomatic with daily palpitations and worsening anxiety. Will set up evaluation with Dr. Rayann Heman. Question if she might be a candidate for additional medication or ablation. For now, continue Metoprolol and Eliquis.

## 2015-07-12 NOTE — Assessment & Plan Note (Signed)
Worsening anxiety. Recommended increasing Sertraline to 100mg , however she declines for now. Will continue 50mg  daily. Follow up in 4 weeks.

## 2015-07-13 ENCOUNTER — Other Ambulatory Visit: Payer: Self-pay | Admitting: Internal Medicine

## 2015-07-25 ENCOUNTER — Encounter: Payer: Self-pay | Admitting: Internal Medicine

## 2015-07-25 ENCOUNTER — Ambulatory Visit (INDEPENDENT_AMBULATORY_CARE_PROVIDER_SITE_OTHER): Payer: 59 | Admitting: Internal Medicine

## 2015-07-25 VITALS — BP 160/84 | HR 68 | Ht 63.0 in | Wt 229.2 lb

## 2015-07-25 DIAGNOSIS — E669 Obesity, unspecified: Secondary | ICD-10-CM | POA: Diagnosis not present

## 2015-07-25 DIAGNOSIS — I480 Paroxysmal atrial fibrillation: Secondary | ICD-10-CM | POA: Diagnosis not present

## 2015-07-25 MED ORDER — FLECAINIDE ACETATE 50 MG PO TABS: 50.0000 mg | ORAL_TABLET | Freq: Two times a day (BID) | ORAL | Status: DC

## 2015-07-25 NOTE — Addendum Note (Signed)
Addended by: Janan Halter F on: 07/25/2015 10:30 AM   Modules accepted: Orders

## 2015-07-25 NOTE — Progress Notes (Signed)
Electrophysiology Office Note   Date:  07/25/2015   ID:  Jeretta, Bezerra 1951/01/30, MRN [id number]  PCP:  Rica Mast, MD  Cardiologist:  Previously has seen Dr Acie Fredrickson,  Plans to see Dr Dorene Ar. Primary Electrophysiologist: Thompson Grayer, MD    Chief Complaint  Patient presents with  . Atrial Fibrillation     History of Present Illness: Bonnie Shaw is a 65 y.o. female who presents today for electrophysiology evaluation.   She reports initially being diagnosed with atrial fibrillation in 2010.  This was attributed to hyperthyroidism.  She underwent RAI ablation at that time.  She had some improvement in atrial fibrillation with this treatment.  Since that time she has been on metoprolol.  She reports increasing frequency and duration of atrial fibrillation since that time.  She has been placed on eliquis by Dr Acie Fredrickson.  She is afraid of this medicine because of "lawyer ads" on TV.  Her afib has been worse over the past year.  She reports that episodes typically last about an hour and occur every few weeks.  She had daily afib several weeks ago but has since done better.  She has fatigue and decreased exercise tolerance during afib.     Today, she denies symptoms of chest pain, shortness of breath, orthopnea, PND, lower extremity edema, claudication, dizziness, presyncope, syncope, bleeding, or neurologic sequela. The patient is tolerating medications without difficulties and is otherwise without complaint today.  She has back pain and anxiety.   Past Medical History  Diagnosis Date  . Hyperlipidemia   . Hypertension   . Anxiety   . Palpitations   . Paroxysmal atrial fibrillation (HCC)   . Panic disorder   . Hyperthyroidism     s/p RAI therapy  . Cataract   . Obesity    Past Surgical History  Procedure Laterality Date  . Thyroid ablation  2010  . Cataract extraction    . Abdominal hysterectomy    . Knee surgery      left knee     Current Outpatient  Prescriptions  Medication Sig Dispense Refill  . apixaban (ELIQUIS) 5 MG TABS tablet Take 1 tablet (5 mg total) by mouth 2 (two) times daily. 180 tablet 3  . atorvastatin (LIPITOR) 20 MG tablet Take 1 tablet by mouth  daily 90 tablet 3  . hydrochlorothiazide (HYDRODIURIL) 25 MG tablet Take 25 mg by mouth daily.    Marland Kitchen levothyroxine (SYNTHROID, LEVOTHROID) 50 MCG tablet Take 1 tablet by mouth  daily 90 tablet 2  . metoprolol succinate (TOPROL-XL) 100 MG 24 hr tablet Take 1 tablet by mouth  daily 90 tablet 3  . Multiple Vitamin (MULTIVITAMIN) tablet Take 1 tablet by mouth daily.    . Omega-3 Fatty Acids (FISH OIL) 1000 MG CAPS Take 1,000 mg by mouth as directed.     . potassium chloride (K-DUR,KLOR-CON) 10 MEQ tablet Take 1 tablet by mouth  daily 90 tablet 2  . sertraline (ZOLOFT) 50 MG tablet Take 1 tablet (50 mg total) by mouth daily. 90 tablet 3   No current facility-administered medications for this visit.    Allergies:   Sulfonamide derivatives   Social History:  The patient  reports that she has never smoked. She has never used smokeless tobacco. She reports that she does not drink alcohol or use illicit drugs.   Family History:  The patient's  family history includes Alzheimer's disease in her father; Cancer in her sister; Ovarian cancer (age of onset:  34) in her sister; Stroke in her mother.    ROS:  Please see the history of present illness.   All other systems are reviewed and negative.    PHYSICAL EXAM: VS:  BP 160/84 mmHg  Pulse 68  Ht 5\' 3"  (1.6 m)  Wt 229 lb 3.2 oz (103.964 kg)  BMI 40.61 kg/m2 , BMI Body mass index is 40.61 kg/(m^2). GEN: overweight, in no acute distress HEENT: normal Neck: no JVD, carotid bruits, or masses Cardiac: RRR; no murmurs, rubs, or gallops,no edema  Respiratory:  clear to auscultation bilaterally, normal work of breathing GI: soft, nontender, nondistended, + BS MS: no deformity or atrophy Skin: warm and dry  Neuro:  Strength and sensation  are intact Psych: euthymic mood, full affect  EKG:  EKG is ordered today. The ekg ordered today shows sinus rhythm with PACs   Recent Labs: 06/11/2015: ALT 22; BUN 13; Creatinine, Ser 0.73; Hemoglobin 14.9; Platelets 207.0; Potassium 3.6; Sodium 142; TSH 1.98    Lipid Panel     Component Value Date/Time   CHOL 169 06/11/2015 1509   TRIG 229.0* 06/11/2015 1509   HDL 45.40 06/11/2015 1509   CHOLHDL 4 06/11/2015 1509   VLDL 45.8* 06/11/2015 1509   LDLCALC 75 05/11/2014 0909   LDLDIRECT 95.0 06/11/2015 1509     Wt Readings from Last 3 Encounters:  07/25/15 229 lb 3.2 oz (103.964 kg)  07/12/15 231 lb 6.4 oz (104.962 kg)  06/11/15 235 lb 12.8 oz (106.958 kg)      Other studies Reviewed: Additional studies/ records that were reviewed today include: Dr Derry Skill notes, echo 2015    ASSESSMENT AND PLAN:  1.  Paroxysmal atrial fibrillation The patient has symptomatic atrial fibrillation.  She has not tried AADs chads2vasc score is at least 3.  She is appropriately anticoagulated with eliqus and rate controlled with metoprolol. Lifestyle modificaiton including regular exercise and weight control are essential to maintaining sinus rhythm long term. Therapeutic strategies for afib including medicine and ablation were discussed in detail with the patient today.  At this time, I would favor AAD therapy.  I will start flecainide 50mg  BID.  She will follow-up with Butch Penny in the AF clinic in 4 weeks to see if dose needs to be increased.  ETT could be ordered at that time. Last echo 2015.  Will repeat echo to evaluate for structural changes related to afib  2. Morbid obesity Body mass index is 40.61 kg/(m^2). Lifestyle modification including regular exercise and weight reduction were discussed at length Sleep study is ordered  3. HTN Stable No change required today  Follow-up:  In AF clinic with Butch Penny in 4 weeks I will see again in 3 months  Current medicines are reviewed at length  with the patient today.   The patient does not have concerns regarding her medicines.  The following changes were made today:  none    Signed, Thompson Grayer, MD  07/25/2015 9:17 AM     [facility] [address] [place] [address] [phone] (office) [phone] (fax)

## 2015-07-25 NOTE — Patient Instructions (Signed)
Medication Instructions:  Your physician has recommended you make the following change in your medication:  1) Start Flecainide 50 mg twice daily   Labwork: None ordered   Testing/Procedures: Your physician has requested that you have an echocardiogram. Echocardiography is a painless test that uses sound waves to create images of your heart. It provides your doctor with information about the size and shape of your heart and how well your heart's chambers and valves are working. This procedure takes approximately one hour. There are no restrictions for this procedure.  Your physician has recommended that you have a sleep study. This test records several body functions during sleep, including: brain activity, eye movement, oxygen and carbon dioxide blood levels, heart rate and rhythm, breathing rate and rhythm, the flow of air through your mouth and nose, snoring, body muscle movements, and chest and belly movement.    Follow-Up: Your physician recommends that you schedule a follow-up appointment in: 4 weeks with Roderic Palau, NP and 3 months with Dr Rayann Heman   Any Other Special Instructions Will Be Listed Below (If Applicable).     If you need a refill on your cardiac medications before your next appointment, please call your pharmacy.

## 2015-07-30 ENCOUNTER — Other Ambulatory Visit: Payer: Self-pay | Admitting: Internal Medicine

## 2015-07-30 DIAGNOSIS — Z1231 Encounter for screening mammogram for malignant neoplasm of breast: Secondary | ICD-10-CM

## 2015-08-06 ENCOUNTER — Encounter: Payer: Self-pay | Admitting: *Deleted

## 2015-08-09 ENCOUNTER — Ambulatory Visit: Payer: 59

## 2015-08-13 ENCOUNTER — Encounter: Payer: Self-pay | Admitting: Internal Medicine

## 2015-08-13 ENCOUNTER — Telehealth: Payer: Self-pay | Admitting: *Deleted

## 2015-08-13 ENCOUNTER — Ambulatory Visit (INDEPENDENT_AMBULATORY_CARE_PROVIDER_SITE_OTHER): Payer: 59 | Admitting: Internal Medicine

## 2015-08-13 VITALS — BP 156/80 | HR 51 | Ht 63.0 in | Wt 229.0 lb

## 2015-08-13 DIAGNOSIS — I480 Paroxysmal atrial fibrillation: Secondary | ICD-10-CM

## 2015-08-13 DIAGNOSIS — F419 Anxiety disorder, unspecified: Secondary | ICD-10-CM | POA: Diagnosis not present

## 2015-08-13 DIAGNOSIS — I10 Essential (primary) hypertension: Secondary | ICD-10-CM | POA: Diagnosis not present

## 2015-08-13 NOTE — Progress Notes (Signed)
Pre visit review using our clinic review tool, if applicable. No additional management support is needed unless otherwise documented below in the visit note. 

## 2015-08-13 NOTE — Telephone Encounter (Signed)
Patient has a note in her check out notes to schedule with Dr. Nicki Reaper in 1 month for a follow up per Dr Gilford Rile

## 2015-08-13 NOTE — Patient Instructions (Addendum)
Continue current medications.  Follow up for ECHO as scheduled.

## 2015-08-13 NOTE — Assessment & Plan Note (Signed)
Wt Readings from Last 3 Encounters:  08/13/15 229 lb (103.874 kg)  07/25/15 229 lb 3.2 oz (103.964 kg)  07/12/15 231 lb 6.4 oz (104.962 kg)   Encouraged follow up for sleep study as recommended by Dr. Rayann Heman. Encouraged her to limit carbohydrates. Encouraged exercise.

## 2015-08-13 NOTE — Assessment & Plan Note (Signed)
Reviewed notes from Cardiology. Regular rhythm now on Flecainide. Anticoagulated with Eliquis. Encouraged follow up for ECHO as scheduled.

## 2015-08-13 NOTE — Assessment & Plan Note (Signed)
Symptoms improved on Sertraline. Will continue current dose.

## 2015-08-13 NOTE — Progress Notes (Signed)
Subjective:    Patient ID: Bonnie Shaw, female    DOB: [birth year], 65 y.o.   MRN: [id number]  HPI  65YO female presents for follow up.  Last seen 6/08 for worsening anxiety and AFIB. Seen by Cardiology and started on Flecainide.  Feeling better. More energetic. Anxiety better controlled. No recent episodes of atrial fibrillation. Working on losing weight.  Wt Readings from Last 3 Encounters:  08/13/15 229 lb (103.874 kg)  07/25/15 229 lb 3.2 oz (103.964 kg)  07/12/15 231 lb 6.4 oz (104.962 kg)   BP Readings from Last 3 Encounters:  08/13/15 156/80  07/25/15 160/84  07/12/15 120/60    Past Medical History  Diagnosis Date  . Hyperlipidemia   . Hypertension   . Anxiety   . Palpitations   . Paroxysmal atrial fibrillation (HCC)   . Panic disorder   . Hyperthyroidism     s/p RAI therapy  . Cataract   . Obesity   . Snoring    Family History  Problem Relation Age of Onset  . Alzheimer's disease Father   . Stroke Mother   . Cancer Sister     ovarian   . Ovarian cancer Sister 26   Past Surgical History  Procedure Laterality Date  . Thyroid ablation  2010  . Cataract extraction    . Abdominal hysterectomy    . Knee surgery      left knee   Social History   Social History  . Marital Status: Married    Spouse Name: N/A  . Number of Children: 4  . Years of Education: N/A   Occupational History  .     Social History Main Topics  . Smoking status: Never Smoker   . Smokeless tobacco: Never Used  . Alcohol Use: No  . Drug Use: No  . Sexual Activity: Not Asked   Other Topics Concern  . 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  Eyes: Negative for  visual disturbance.  Respiratory: Negative for shortness of breath.   Cardiovascular: Negative for chest pain, palpitations and leg swelling.  Gastrointestinal: Negative for abdominal pain, diarrhea and constipation.  Skin: Negative for color change and rash.  Hematological: Negative for adenopathy. Does not bruise/bleed easily.  Psychiatric/Behavioral: Negative for suicidal ideas, sleep disturbance and dysphoric mood. The patient is not nervous/anxious.        Objective:    BP 156/80 mmHg  Pulse 51  Ht 5\' 3"  (1.6 m)  Wt 229 lb (103.874 kg)  BMI 40.58 kg/m2  SpO2 97% Physical Exam  Constitutional: She is oriented to person, place, and time. She appears well-developed and well-nourished. No distress.  HENT:  Head: Normocephalic and atraumatic.  Right Ear: External ear normal.  Left Ear: External ear normal.  Nose: Nose normal.  Mouth/Throat: Oropharynx is clear and moist. No oropharyngeal exudate.  Eyes: Conjunctivae are normal. Pupils are equal, round, and reactive to light. Right eye exhibits no discharge. Left eye exhibits no discharge. No scleral icterus.  Neck: Normal range of motion. Neck supple. No tracheal deviation present. No thyromegaly present.  Cardiovascular: Normal rate, regular rhythm, normal heart sounds  and intact distal pulses.  Exam reveals no gallop and no friction rub.   No murmur heard. Pulmonary/Chest: Effort normal and breath sounds normal. No respiratory distress. She has no wheezes. She has no rales. She exhibits no tenderness.  Musculoskeletal: Normal range of motion. She exhibits no edema or tenderness.  Lymphadenopathy:    She has no cervical adenopathy.  Neurological: She is alert and oriented to person, place, and time. No cranial nerve deficit. She exhibits normal muscle tone. Coordination normal.  Skin: Skin is warm and dry. No rash noted. She is not diaphoretic. No erythema. No pallor.  Psychiatric: She has a normal mood and affect. Her behavior  is normal. Judgment and thought content normal.          Assessment & Plan:   Problem List Items Addressed This Visit      Unprioritized   Anxiety    Symptoms improved on Sertraline. Will continue current dose.      Atrial fibrillation ([place]) - Primary    Reviewed notes from Cardiology. Regular rhythm now on Flecainide. Anticoagulated with Eliquis. Encouraged follow up for ECHO as scheduled.      Hypertension    BP Readings from Last 3 Encounters:  08/13/15 156/80  07/25/15 160/84  07/12/15 120/60   BP slightly elevated, however generally has been well controlled. Will monitor for now, as she recently started Flecainide. Continue Metoprolol and and HCTZ.      Severe obesity (BMI >= 40) (HCC)    Wt Readings from Last 3 Encounters:  08/13/15 229 lb (103.874 kg)  07/25/15 229 lb 3.2 oz (103.964 kg)  07/12/15 231 lb 6.4 oz (104.962 kg)   Encouraged follow up for sleep study as recommended by Dr. Rayann Heman. Encouraged her to limit carbohydrates. Encouraged exercise.          Return in about 4 weeks (around 09/10/2015) for New Patient.  Ronette Deter, MD Internal Medicine [facility]

## 2015-08-13 NOTE — Assessment & Plan Note (Signed)
BP Readings from Last 3 Encounters:  08/13/15 156/80  07/25/15 160/84  07/12/15 120/60   BP slightly elevated, however generally has been well controlled. Will monitor for now, as she recently started Flecainide. Continue Metoprolol and and HCTZ.

## 2015-08-16 NOTE — Telephone Encounter (Signed)
Prior Authorization for sleep study claim started with [facility].  Ref #  [id number]                   Response time 7-15 business days

## 2015-08-21 ENCOUNTER — Encounter (HOSPITAL_COMMUNITY): Payer: Self-pay | Admitting: Nurse Practitioner

## 2015-08-21 ENCOUNTER — Ambulatory Visit (HOSPITAL_COMMUNITY)
Admission: RE | Admit: 2015-08-21 | Discharge: 2015-08-21 | Disposition: A | Discharge status: Home / Self Care | Payer: 59 | Source: Ambulatory Visit | Attending: Nurse Practitioner | Admitting: Nurse Practitioner

## 2015-08-21 VITALS — BP 136/72 | HR 49 | Ht 63.0 in | Wt 226.0 lb

## 2015-08-21 DIAGNOSIS — Z7901 Long term (current) use of anticoagulants: Secondary | ICD-10-CM | POA: Diagnosis not present

## 2015-08-21 DIAGNOSIS — E0590 Thyrotoxicosis, unspecified without thyrotoxic crisis or storm: Secondary | ICD-10-CM | POA: Insufficient documentation

## 2015-08-21 DIAGNOSIS — I10 Essential (primary) hypertension: Secondary | ICD-10-CM | POA: Insufficient documentation

## 2015-08-21 DIAGNOSIS — E785 Hyperlipidemia, unspecified: Secondary | ICD-10-CM | POA: Insufficient documentation

## 2015-08-21 DIAGNOSIS — Z79899 Other long term (current) drug therapy: Secondary | ICD-10-CM | POA: Diagnosis not present

## 2015-08-21 DIAGNOSIS — I480 Paroxysmal atrial fibrillation: Secondary | ICD-10-CM

## 2015-08-21 DIAGNOSIS — F419 Anxiety disorder, unspecified: Secondary | ICD-10-CM | POA: Insufficient documentation

## 2015-08-21 DIAGNOSIS — E669 Obesity, unspecified: Secondary | ICD-10-CM | POA: Insufficient documentation

## 2015-08-21 DIAGNOSIS — I4891 Unspecified atrial fibrillation: Secondary | ICD-10-CM | POA: Diagnosis present

## 2015-08-21 NOTE — Progress Notes (Signed)
Patient ID: SONDREA BREN, female   DOB: 16-May-1950, 65 y.o.   MRN: [id number]     Primary Care Physician: Rica Mast, MD Referring Physician: Dr. Donnie Mesa is a 65 y.o. female with a h/o PAF that was started on flecainide by Dr. Rayann Heman latter part of June. She is in Afib clinic today for follow up. She has not had any further afib and during well tolerating flecainide. HR is usually in 50's at home, tolerating without any dizziness.  Has been on metoprolol for years for BP issues at 100 mg a day. She has been recommended for a sleep study but has not heard from them for a date. Echo is pending.  Today, she denies symptoms of palpitations, chest pain, shortness of breath, orthopnea, PND, lower extremity edema, dizziness, presyncope, syncope, or neurologic sequela. The patient is tolerating medications without difficulties and is otherwise without complaint today.   Past Medical History  Diagnosis Date  . Hyperlipidemia   . Hypertension   . Anxiety   . Palpitations   . Paroxysmal atrial fibrillation (HCC)   . Panic disorder   . Hyperthyroidism     s/p RAI therapy  . Cataract   . Obesity   . Snoring    Past Surgical History  Procedure Laterality Date  . Thyroid ablation  2010  . Cataract extraction    . Abdominal hysterectomy    . Knee surgery      left knee    Current Outpatient Prescriptions  Medication Sig Dispense Refill  . apixaban (ELIQUIS) 5 MG TABS tablet Take 1 tablet (5 mg total) by mouth 2 (two) times daily. 180 tablet 3  . atorvastatin (LIPITOR) 20 MG tablet Take 1 tablet by mouth  daily 90 tablet 3  . flecainide (TAMBOCOR) 50 MG tablet Take 1 tablet (50 mg total) by mouth 2 (two) times daily. 180 tablet 3  . hydrochlorothiazide (HYDRODIURIL) 25 MG tablet Take 25 mg by mouth daily.    Marland Kitchen levothyroxine (SYNTHROID, LEVOTHROID) 50 MCG tablet Take 1 tablet by mouth  daily 90 tablet 2  . metoprolol succinate (TOPROL-XL) 100 MG 24 hr tablet Take  1 tablet by mouth  daily 90 tablet 3  . potassium chloride (K-DUR,KLOR-CON) 10 MEQ tablet Take 1 tablet by mouth  daily 90 tablet 2  . sertraline (ZOLOFT) 50 MG tablet Take 1 tablet (50 mg total) by mouth daily. 90 tablet 3   No current facility-administered medications for this encounter.    Allergies  Allergen Reactions  . Sulfonamide Derivatives     unknown    Social History   Social History  . Marital Status: Married    Spouse Name: N/A  . Number of Children: 4  . Years of Education: N/A   Occupational History  .     Social History Main Topics  . Smoking status: Never Smoker   . Smokeless tobacco: Never Used  . Alcohol Use: No  . Drug Use: No  . Sexual Activity: Not on file   Other Topics Concern  . 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 Alzheimer's disease Father   . Stroke Mother   .  Cancer Sister     ovarian   . Ovarian cancer Sister 59    ROS- All systems are reviewed and negative except as per the HPI above  Physical Exam: Filed Vitals:   08/21/15 1036  BP: 136/72  Pulse: 49  Height: 5\' 3"  (1.6 m)  Weight: 226 lb (102.513 kg)    GEN- The patient is well appearing, alert and oriented x 3 today.   Head- normocephalic, atraumatic Eyes-  Sclera clear, conjunctiva pink Ears- hearing intact Oropharynx- clear Neck- supple, no JVP Lymph- no cervical lymphadenopathy Lungs- Clear to ausculation bilaterally, normal work of breathing Heart- Regular rate and rhythm, no murmurs, rubs or gallops, PMI not laterally displaced GI- soft, NT, ND, + BS Extremities- no clubbing, cyanosis, or edema MS- no significant deformity or atrophy Skin- no rash or lesion Psych- euthymic mood, full affect Neuro- strength and  sensation are intact  EKG- Sinus brady at 49 bpm, pr int 194 ms, qrs int 78 ms, qtc 411 ms Epic records reviewed Echo pending Sleep study pending  Assessment and Plan: 1. PAF Doing well on flecainide 50 mg bid Continue metoprolol but watch for symptomatic bradycardia Continue on eliquis 5 mg bid Echo  Sleep study ETT scheduled  2. HTN Stable   F/u with Dr. Rayann Heman as scheduled 9/25  [name]. Kammie Scioli, [facility] [address] [place], [place] [id number] [phone]

## 2015-08-29 ENCOUNTER — Ambulatory Visit: Payer: 59

## 2015-08-30 ENCOUNTER — Encounter: Payer: Self-pay | Admitting: *Deleted

## 2015-08-30 NOTE — Progress Notes (Signed)
Patient arrived for appointment and after realizing see was going to have female echo tech asked to speak with the manager.  Spoke with patient and apologized that we were not able to accommodate her preference today and that I would be happy to sit in the room with her during the echo or we could reschedule the echo to a different day when a female echo tech was available.  Patient was very upset and did not want either option and wanted to transfer her echo to the [place] office.  To facilitate this transfer for her I called to set up the appointment for her, the appointment given to me was going to be mid August and I was notified the billing would be different than the [facility] office due to the split billing with the hospital and clinic.  Called patient to inform her of this information, she stated she will call the [facility] office herself and make the appointment.  Received a follow-up phone call from the patient today letting my know that she was still very upset and that our office had mislead her with information that when she called [facility] and was able to get an appointment August 1st with a female tech and was told she would not be charged any additional money for having the echo in the [place] office.  I politely thank the patient for the information and the call was ended.

## 2015-09-04 ENCOUNTER — Ambulatory Visit (HOSPITAL_COMMUNITY): Payer: 59 | Attending: Cardiovascular Disease

## 2015-09-04 ENCOUNTER — Other Ambulatory Visit: Payer: Self-pay

## 2015-09-04 DIAGNOSIS — E785 Hyperlipidemia, unspecified: Secondary | ICD-10-CM | POA: Insufficient documentation

## 2015-09-04 DIAGNOSIS — I340 Nonrheumatic mitral (valve) insufficiency: Secondary | ICD-10-CM | POA: Diagnosis not present

## 2015-09-04 DIAGNOSIS — Z6841 Body Mass Index (BMI) 40.0 and over, adult: Secondary | ICD-10-CM | POA: Insufficient documentation

## 2015-09-04 DIAGNOSIS — I77810 Thoracic aortic ectasia: Secondary | ICD-10-CM | POA: Insufficient documentation

## 2015-09-04 DIAGNOSIS — E669 Obesity, unspecified: Secondary | ICD-10-CM | POA: Diagnosis not present

## 2015-09-04 DIAGNOSIS — I4891 Unspecified atrial fibrillation: Secondary | ICD-10-CM | POA: Diagnosis present

## 2015-09-04 DIAGNOSIS — I119 Hypertensive heart disease without heart failure: Secondary | ICD-10-CM | POA: Diagnosis not present

## 2015-09-04 DIAGNOSIS — I480 Paroxysmal atrial fibrillation: Secondary | ICD-10-CM | POA: Diagnosis not present

## 2015-09-04 DIAGNOSIS — I071 Rheumatic tricuspid insufficiency: Secondary | ICD-10-CM | POA: Diagnosis not present

## 2015-09-06 ENCOUNTER — Ambulatory Visit (INDEPENDENT_AMBULATORY_CARE_PROVIDER_SITE_OTHER): Payer: 59

## 2015-09-06 DIAGNOSIS — I480 Paroxysmal atrial fibrillation: Secondary | ICD-10-CM | POA: Diagnosis not present

## 2015-09-06 LAB — EXERCISE TOLERANCE TEST
CHL CUP STRESS STAGE 1 GRADE: 0 %
CHL CUP STRESS STAGE 1 HR: 71 {beats}/min
CHL CUP STRESS STAGE 1 SPEED: 0 mph
CHL CUP STRESS STAGE 2 DBP: 86 mmHg
CHL CUP STRESS STAGE 2 GRADE: 0 %
CHL CUP STRESS STAGE 3 SPEED: 1 mph
CHL CUP STRESS STAGE 4 GRADE: 0.1 %
CHL CUP STRESS STAGE 4 SPEED: 1 mph
CHL CUP STRESS STAGE 5 DBP: 63 mmHg
CHL CUP STRESS STAGE 5 SBP: 144 mmHg
CHL CUP STRESS STAGE 5 SPEED: 1.7 mph
CHL CUP STRESS STAGE 6 HR: 127 {beats}/min
CHL CUP STRESS STAGE 6 SBP: 199 mmHg
CHL CUP STRESS STAGE 7 SPEED: 2.5 mph
CHL CUP STRESS STAGE 8 GRADE: 0 %
CHL CUP STRESS STAGE 8 SPEED: 0 mph
CHL CUP STRESS STAGE 9 DBP: 71 mmHg
CHL CUP STRESS STAGE 9 GRADE: 0 %
CHL CUP STRESS STAGE 9 SBP: 151 mmHg
CHL CUP STRESS STAGE 9 SPEED: 0 mph
Estimated workload: 7 METS
Exercise duration (min): 6 min
Exercise duration (sec): 0 s
MPHR: 155 {beats}/min
Peak HR: 126 {beats}/min
Percent HR: 81 %
Percent of predicted max HR: 81 %
RPE: 17
Rest HR: 59 {beats}/min
Stage 2 HR: 64 {beats}/min
Stage 2 SBP: 142 mmHg
Stage 2 Speed: 0 mph
Stage 3 Grade: 0 %
Stage 3 HR: 68 {beats}/min
Stage 4 HR: 69 {beats}/min
Stage 5 Grade: 10 %
Stage 5 HR: 117 {beats}/min
Stage 6 DBP: 78 mmHg
Stage 6 Grade: 12 %
Stage 6 Speed: 2.5 mph
Stage 7 Grade: 12.2 %
Stage 7 HR: 126 {beats}/min
Stage 8 DBP: 69 mmHg
Stage 8 HR: 100 {beats}/min
Stage 8 SBP: 177 mmHg
Stage 9 HR: 66 {beats}/min

## 2015-09-07 ENCOUNTER — Ambulatory Visit (HOSPITAL_BASED_OUTPATIENT_CLINIC_OR_DEPARTMENT_OTHER): Payer: 59 | Attending: Internal Medicine | Admitting: Cardiology

## 2015-09-07 DIAGNOSIS — Z6841 Body Mass Index (BMI) 40.0 and over, adult: Secondary | ICD-10-CM | POA: Diagnosis not present

## 2015-09-07 DIAGNOSIS — I493 Ventricular premature depolarization: Secondary | ICD-10-CM | POA: Insufficient documentation

## 2015-09-07 DIAGNOSIS — R0683 Snoring: Secondary | ICD-10-CM | POA: Diagnosis not present

## 2015-09-07 DIAGNOSIS — Z7901 Long term (current) use of anticoagulants: Secondary | ICD-10-CM | POA: Diagnosis not present

## 2015-09-07 DIAGNOSIS — Z79899 Other long term (current) drug therapy: Secondary | ICD-10-CM | POA: Insufficient documentation

## 2015-09-07 DIAGNOSIS — I480 Paroxysmal atrial fibrillation: Secondary | ICD-10-CM | POA: Diagnosis not present

## 2015-09-07 DIAGNOSIS — I491 Atrial premature depolarization: Secondary | ICD-10-CM | POA: Diagnosis not present

## 2015-09-07 DIAGNOSIS — E669 Obesity, unspecified: Secondary | ICD-10-CM | POA: Diagnosis present

## 2015-09-10 NOTE — Procedures (Signed)
   Patient Name: Bonnie Shaw, Bonnie Shaw Date: 09/07/2015 Gender: Female D.O.B: 1950-10-29 Age (years): 88 Referring Provider: Thompson Grayer Height (inches): 9 Interpreting Physician: Fransico Him MD, ABSM Weight (lbs): 225 RPSGT: Earney Hamburg BMI: 40 MRN: [id number] Neck Size: 16.00  CLINICAL INFORMATION Sleep Study Type: NPSG Indication for sleep study: Obesity Epworth Sleepiness Score: 6  SLEEP STUDY TECHNIQUE As per the AASM Manual for the Scoring of Sleep and Associated Events v2.3 (April 2016) with a hypopnea requiring 4% desaturations. The channels recorded and monitored were frontal, central and occipital EEG, electrooculogram (EOG), submentalis EMG (chin), nasal and oral airflow, thoracic and abdominal wall motion, anterior tibialis EMG, snore microphone, electrocardiogram, and pulse oximetry.  MEDICATIONS Patient's medications include: ELIQUIS, FLEACAINIDE ACETATE, LIPITOR, ZOLOFT. Medications self-administered by patient during sleep study : No sleep medicine administered.  SLEEP ARCHITECTURE The study was initiated at 10:40:30 PM and ended at 4:42:10 AM. Sleep onset time was 152.3 minutes and the sleep efficiency was 52.3%. The total sleep time was 189.0 minutes. Stage REM latency was N/A minutes. The patient spent 3.44% of the night in stage N1 sleep, 96.56% in stage N2 sleep, 0.00% in stage N3 and 0.00% in REM. Alpha intrusion was absent. Supine sleep was 0.00%.  RESPIRATORY PARAMETERS The overall apnea/hypopnea index (AHI) was 1.0 per hour. There were 0 total apneas, including 0 obstructive, 0 central and 0 mixed apneas. There were 3 hypopneas and 16 RERAs. The AHI during Stage REM sleep was N/A per hour. AHI while supine was N/A per hour. The mean oxygen saturation was 92.90%. The minimum SpO2 during sleep was 88.00%. Moderate snoring was noted during this study.  CARDIAC DATA The 2 lead EKG demonstrated sinus rhythm. The mean heart rate was 57.36 beats  per minute. Other EKG findings include: PVCs. and frequent PACs and atrial couplets.  LEG MOVEMENT DATA The total PLMS were 4 with a resulting PLMS index of 1.27. Associated arousal with leg movement index was 0.6 .  IMPRESSIONS - No significant obstructive sleep apnea occurred during this study (AHI = 1.0/h). - No significant central sleep apnea occurred during this study (CAI = 0.0/h). - Minimal oxygen desaturation was noted during this study (Min O2 = 88.00%). - The patient snored with Moderate snoring volume. - EKG findings include PVCs. and PACs as well as atrial couplets. - Clinically significant periodic limb movements did not occur during sleep. No significant associated arousals.  DIAGNOSIS - PVCs - PACs - Obesity  RECOMMENDATIONS - Avoid alcohol, sedatives and other CNS depressants that may result in sleep apnea and disrupt normal sleep architecture. - Sleep hygiene should be reviewed to assess factors that may improve sleep quality. - Weight management and regular exercise should be initiated or continued if appropriate.  [place], American Board of Sleep Medicine  ELECTRONICALLY SIGNED ON:  09/10/2015, 4:39 PM [place] PH: [phone]   FX: [phone] [place]

## 2015-09-10 NOTE — Progress Notes (Signed)
Patient aware of sleep study results. Stated verbal understanding and was thankful for the call.  I let her know if she had any further questions she could always call.

## 2015-09-11 ENCOUNTER — Other Ambulatory Visit: Payer: Self-pay | Admitting: Internal Medicine

## 2015-09-11 ENCOUNTER — Ambulatory Visit
Admission: RE | Admit: 2015-09-11 | Discharge: 2015-09-11 | Disposition: A | Discharge status: Home / Self Care | Payer: 59 | Source: Ambulatory Visit | Attending: Internal Medicine | Admitting: Internal Medicine

## 2015-09-11 DIAGNOSIS — Z1231 Encounter for screening mammogram for malignant neoplasm of breast: Secondary | ICD-10-CM | POA: Diagnosis present

## 2015-09-12 ENCOUNTER — Encounter: Payer: Self-pay | Admitting: Internal Medicine

## 2015-09-12 DIAGNOSIS — Z0000 Encounter for general adult medical examination without abnormal findings: Secondary | ICD-10-CM | POA: Insufficient documentation

## 2015-09-18 ENCOUNTER — Ambulatory Visit (INDEPENDENT_AMBULATORY_CARE_PROVIDER_SITE_OTHER): Payer: 59 | Admitting: Internal Medicine

## 2015-09-18 ENCOUNTER — Encounter: Payer: Self-pay | Admitting: Internal Medicine

## 2015-09-18 VITALS — BP 118/70 | HR 53 | Temp 98.3°F | Resp 17 | Ht 63.0 in | Wt 223.5 lb

## 2015-09-18 DIAGNOSIS — I480 Paroxysmal atrial fibrillation: Secondary | ICD-10-CM

## 2015-09-18 DIAGNOSIS — E669 Obesity, unspecified: Secondary | ICD-10-CM

## 2015-09-18 DIAGNOSIS — F419 Anxiety disorder, unspecified: Secondary | ICD-10-CM | POA: Diagnosis not present

## 2015-09-18 DIAGNOSIS — I10 Essential (primary) hypertension: Secondary | ICD-10-CM

## 2015-09-18 DIAGNOSIS — E559 Vitamin D deficiency, unspecified: Secondary | ICD-10-CM | POA: Diagnosis not present

## 2015-09-18 DIAGNOSIS — R739 Hyperglycemia, unspecified: Secondary | ICD-10-CM

## 2015-09-18 DIAGNOSIS — E785 Hyperlipidemia, unspecified: Secondary | ICD-10-CM

## 2015-09-18 DIAGNOSIS — E039 Hypothyroidism, unspecified: Secondary | ICD-10-CM

## 2015-09-18 NOTE — Progress Notes (Signed)
Patient ID: Bonnie Shaw, female   DOB: 28-Dec-1950, 65 y.o.   MRN: [id number]   Subjective:    Patient ID: Bonnie Shaw, female    DOB: March 12, 1950, 65 y.o.   MRN: [id number]  HPI  Patient here to establish care.  Former pt of Dr Gilford Rile.  Is doing better now that she is back on zoloft.  Has history of afib.  Followed by cardiology.  On flecanide.  Doing better on the medication.  Energy improved.  No chest pain.  No sob.  No acid reflux reported.  No abdominal pain or cramping.  Has had colonoscopy.  Was recently tested for OSA.  Negative.  Overall feels better.     Past Medical History:  Diagnosis Date  . Anxiety   . Cataract   . Hyperlipidemia   . Hypertension   . Hyperthyroidism    s/p RAI therapy  . Obesity   . Palpitations   . Panic disorder   . Paroxysmal atrial fibrillation (HCC)   . Snoring    Past Surgical History:  Procedure Laterality Date  . ABDOMINAL HYSTERECTOMY    . CATARACT EXTRACTION    . KNEE SURGERY     left knee  . THYROID ABLATION  2010   Family History  Problem Relation Age of Onset  . Alzheimer's disease Father   . Stroke Mother   . Cancer Sister     ovarian   . Ovarian cancer Sister 74   Social History   Social History  . Marital status: Married    Spouse name: N/A  . Number of children: 4  . Years of education: N/A   Occupational History  .  Unemployed   Social History Main Topics  . Smoking status: Never Smoker  . Smokeless tobacco: Never Used  . Alcohol use No  . Drug use: No  . Sexual activity: Not Asked   Other Topics Concern  . 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 apixaban (ELIQUIS) 5 MG TABS tablet Take 1 tablet (5 mg total) by mouth 2 (two) times daily.  Marland Kitchen  atorvastatin (LIPITOR) 20 MG tablet Take 1 tablet by mouth  daily  . flecainide (TAMBOCOR) 50 MG tablet Take 1 tablet (50 mg total) by mouth 2 (two) times daily.  . hydrochlorothiazide (HYDRODIURIL) 25 MG tablet Take 25 mg by mouth daily.  Marland Kitchen levothyroxine (SYNTHROID, LEVOTHROID) 50 MCG tablet Take 1 tablet by mouth  daily  . metoprolol succinate (TOPROL-XL) 100 MG 24 hr tablet Take 1 tablet by mouth  daily  . potassium chloride (K-DUR,KLOR-CON) 10 MEQ tablet Take 1 tablet by mouth  daily  . sertraline (ZOLOFT) 50 MG tablet Take 1 tablet (50 mg total) by mouth daily.   No facility-administered encounter medications on file as of 09/18/2015.     Review of Systems  Constitutional: Negative for appetite change.       Has adjusted her diet.  Is losing weight.    HENT: Negative for congestion and sinus pressure.   Respiratory: Negative for cough, chest tightness and shortness of breath.   Cardiovascular: Negative for chest pain, palpitations and leg swelling.  Gastrointestinal: Negative for abdominal  pain, diarrhea, nausea and vomiting.  Genitourinary: Negative for difficulty urinating and dysuria.  Musculoskeletal: Negative for joint swelling and myalgias.  Skin: Negative for color change and rash.  Neurological: Negative for dizziness, light-headedness and headaches.  Psychiatric/Behavioral: Negative for agitation and dysphoric mood.       Anxiety better.        Objective:    Physical Exam  Constitutional: She appears well-developed and well-nourished. No distress.  HENT:  Nose: Nose normal.  Mouth/Throat: Oropharynx is clear and moist.  Neck: Neck supple. No thyromegaly present.  Cardiovascular: Normal rate and regular rhythm.   Pulmonary/Chest: Breath sounds normal. No respiratory distress. She has no wheezes.  Abdominal: Soft. Bowel sounds are normal. There is no tenderness.  Musculoskeletal: She exhibits no edema or tenderness.  Lymphadenopathy:    She has no cervical  adenopathy.  Skin: No rash noted. No erythema.  Psychiatric: She has a normal mood and affect. Her behavior is normal.    BP 118/70   Pulse (!) 53   Temp 98.3 F (36.8 C) (Oral)   Resp 17   Ht 5\' 3"  (1.6 m)   Wt 223 lb 8 oz (101.4 kg)   SpO2 95%   BMI 39.59 kg/m  Wt Readings from Last 3 Encounters:  09/18/15 223 lb 8 oz (101.4 kg)  08/21/15 226 lb (102.5 kg)  08/13/15 229 lb (103.9 kg)     Lab Results  Component Value Date   WBC 9.2 06/11/2015   HGB 14.9 06/11/2015   HCT 43.7 06/11/2015   PLT 207.0 06/11/2015   GLUCOSE 99 06/11/2015   CHOL 169 06/11/2015   TRIG 229.0 (H) 06/11/2015   HDL 45.40 06/11/2015   LDLDIRECT 95.0 06/11/2015   LDLCALC 75 05/11/2014   ALT 22 06/11/2015   AST 18 06/11/2015   NA 142 06/11/2015   K 3.6 06/11/2015   CL 100 06/11/2015   CREATININE 0.73 06/11/2015   BUN 13 06/11/2015   CO2 31 06/11/2015   TSH 1.98 06/11/2015   HGBA1C 5.8 06/11/2015   MICROALBUR <0.7 06/11/2015    Mm Screening Breast Tomo Bilateral  Result Date: 09/11/2015 CLINICAL DATA:  Screening. EXAM: 2D DIGITAL SCREENING BILATERAL MAMMOGRAM WITH CAD AND ADJUNCT TOMO COMPARISON:  Previous exam(s). ACR Breast Density Category b: There are scattered areas of fibroglandular density. FINDINGS: There are no findings suspicious for malignancy. Images were processed with CAD. IMPRESSION: No mammographic evidence of malignancy. A result letter of this screening mammogram will be mailed directly to the patient. RECOMMENDATION: Screening mammogram in one year. (Code:SM-B-01Y) BI-RADS CATEGORY  1: Negative. Electronically Signed   By: Altamese Cabal M.D.   On: 09/11/2015 11:38       Assessment & Plan:   Problem List Items Addressed This Visit    Anxiety    Doing better back on zoloft.  Follow.        Atrial fibrillation [facility])    Reviewed notes from cardiology.  On flecanide.  Doing better.  Feels better. Overall doing better.  Continue f/u with cardiology.         Hyperlipidemia    On lipitor.  Low cholesterol diet and exercise.  Follow lipid panel and liver function tests.        Relevant Orders   Lipid panel   Hepatic function panel   Hypertension    Blood pressure under good control.  Continue same medication regimen.  Follow pressures.  Follow metabolic panel.        Relevant Orders  Basic metabolic panel   Hypothyroidism    On thyroid replacement.  Follow tsh.       Obesity    Diet and exercise.  Follow.        Other Visit Diagnoses    Vitamin D deficiency    -  Primary   Relevant Orders   VITAMIN D 25 Hydroxy (Vit-D Deficiency, Fractures)   Hyperglycemia       Relevant Orders   Hemoglobin A1c      I spent 25 minutes with the patient and more than 50% of the time was spent in consultation regarding the above.    Einar Pheasant, MD

## 2015-09-18 NOTE — Progress Notes (Signed)
Pre-visit discussion using our clinic review tool. No additional management support is needed unless otherwise documented below in the visit note.  

## 2015-09-25 ENCOUNTER — Encounter: Payer: Self-pay | Admitting: Internal Medicine

## 2015-09-25 NOTE — Assessment & Plan Note (Signed)
Doing better back on zoloft.  Follow.

## 2015-09-25 NOTE — Assessment & Plan Note (Signed)
Reviewed notes from cardiology.  On flecanide.  Doing better.  Feels better. Overall doing better.  Continue f/u with cardiology.

## 2015-09-25 NOTE — Assessment & Plan Note (Signed)
On thyroid replacement.  Follow tsh.  

## 2015-09-25 NOTE — Assessment & Plan Note (Signed)
Blood pressure under good control.  Continue same medication regimen.  Follow pressures.  Follow metabolic panel.   

## 2015-09-25 NOTE — Assessment & Plan Note (Signed)
On lipitor.  Low cholesterol diet and exercise.  Follow lipid panel and liver function tests.   

## 2015-09-25 NOTE — Assessment & Plan Note (Signed)
Diet and exercise.  Follow.  

## 2015-09-26 ENCOUNTER — Other Ambulatory Visit: Payer: Self-pay | Admitting: *Deleted

## 2015-09-26 MED ORDER — POTASSIUM CHLORIDE CRYS ER 10 MEQ PO TBCR: 10.0000 meq | EXTENDED_RELEASE_TABLET | Freq: Every day | ORAL | 1 refills | Status: DC

## 2015-09-26 MED ORDER — LEVOTHYROXINE SODIUM 50 MCG PO TABS: ORAL_TABLET | ORAL | 1 refills | Status: DC

## 2015-09-26 MED ORDER — ATORVASTATIN CALCIUM 20 MG PO TABS: 20.0000 mg | ORAL_TABLET | Freq: Every day | ORAL | 1 refills | Status: DC

## 2015-09-28 ENCOUNTER — Other Ambulatory Visit: Payer: Self-pay

## 2015-09-28 MED ORDER — METOPROLOL SUCCINATE ER 100 MG PO TB24: 100.0000 mg | ORAL_TABLET | Freq: Every day | ORAL | 1 refills | Status: DC

## 2015-10-04 ENCOUNTER — Encounter (HOSPITAL_BASED_OUTPATIENT_CLINIC_OR_DEPARTMENT_OTHER): Payer: 59

## 2015-10-29 ENCOUNTER — Encounter: Payer: Self-pay | Admitting: Internal Medicine

## 2015-10-29 ENCOUNTER — Ambulatory Visit (INDEPENDENT_AMBULATORY_CARE_PROVIDER_SITE_OTHER): Payer: 59 | Admitting: Internal Medicine

## 2015-10-29 VITALS — BP 148/86 | HR 53 | Ht 63.0 in | Wt 221.8 lb

## 2015-10-29 DIAGNOSIS — I480 Paroxysmal atrial fibrillation: Secondary | ICD-10-CM | POA: Diagnosis not present

## 2015-10-29 DIAGNOSIS — I10 Essential (primary) hypertension: Secondary | ICD-10-CM

## 2015-10-29 DIAGNOSIS — E669 Obesity, unspecified: Secondary | ICD-10-CM | POA: Diagnosis not present

## 2015-10-29 NOTE — Progress Notes (Signed)
Electrophysiology Office Note   Date:  10/29/2015   ID:  Bonnie Shaw, Bonnie Shaw 13-Apr-1950, MRN [id number]  PCP:  Einar Pheasant, MD  Cardiologist:  Previously has seen Dr Acie Fredrickson,  Plans to see Dr Dorene Ar. Primary Electrophysiologist: Thompson Grayer, MD    Chief Complaint  Patient presents with  . Atrial Fibrillation     History of Present Illness: Bonnie Shaw is a 65 y.o. female who presents today for electrophysiology evaluation.   She reports initially being diagnosed with atrial fibrillation in 2010.  This was attributed to hyperthyroidism.  She underwent RAI ablation at that time.  She had some improvement in atrial fibrillation with this treatment.  Since that time she has been on metoprolol.  She reports increasing frequency and duration of atrial fibrillation since that time.    She recently saw me and was started on flecainide.  She has done "much better" since that time.  She's had only 2 very short episodes. She had ETT which was normal.  Sleep study revealed no OSA. She has lost 9 lbs since I saw her last! She is exercising regularly.  Today, she denies symptoms of chest pain, shortness of breath, orthopnea, PND, lower extremity edema, claudication, dizziness, presyncope, syncope, bleeding, or neurologic sequela. The patient is tolerating medications without difficulties and is otherwise without complaint today.  She has back pain and anxiety.   Past Medical History:  Diagnosis Date  . Anxiety   . Cataract   . Hyperlipidemia   . Hypertension   . Hyperthyroidism    s/p RAI therapy  . Obesity   . Palpitations   . Panic disorder   . Paroxysmal atrial fibrillation (HCC)   . Snoring    Past Surgical History:  Procedure Laterality Date  . ABDOMINAL HYSTERECTOMY    . CATARACT EXTRACTION    . KNEE SURGERY     left knee  . THYROID ABLATION  2010     Current Outpatient Prescriptions  Medication Sig Dispense Refill  . apixaban (ELIQUIS) 5 MG TABS tablet Take 1  tablet (5 mg total) by mouth 2 (two) times daily. 180 tablet 3  . atorvastatin (LIPITOR) 20 MG tablet Take 1 tablet (20 mg total) by mouth daily. 90 tablet 1  . flecainide (TAMBOCOR) 50 MG tablet Take 1 tablet (50 mg total) by mouth 2 (two) times daily. 180 tablet 3  . hydrochlorothiazide (HYDRODIURIL) 25 MG tablet Take 25 mg by mouth daily.    Marland Kitchen levothyroxine (SYNTHROID, LEVOTHROID) 50 MCG tablet Take 1 tablet by mouth  daily 90 tablet 1  . metoprolol succinate (TOPROL-XL) 100 MG 24 hr tablet Take 1 tablet (100 mg total) by mouth daily. Take with or immediately following a meal. 90 tablet 1  . potassium chloride (K-DUR,KLOR-CON) 10 MEQ tablet Take 1 tablet (10 mEq total) by mouth daily. 90 tablet 1  . sertraline (ZOLOFT) 50 MG tablet Take 1 tablet (50 mg total) by mouth daily. 90 tablet 3   No current facility-administered medications for this visit.     Allergies:   Sulfonamide derivatives   Social History:  The patient  reports that she has never smoked. She has never used smokeless tobacco. She reports that she does not drink alcohol or use drugs.   Family History:  The patient's  family history includes Alzheimer's disease in her father; Cancer in her sister; Ovarian cancer (age of onset: 30) in her sister; Stroke in her mother.    ROS:  Please see the  history of present illness.   All other systems are reviewed and negative.    PHYSICAL EXAM: VS:  BP (!) 148/86   Pulse (!) 53   Ht 5\' 3"  (1.6 m)   Wt 221 lb 12.8 oz (100.6 kg)   BMI 39.29 kg/m  , BMI Body mass index is 39.29 kg/m. GEN: overweight, in no acute distress  HEENT: normal  Neck: no JVD, carotid bruits, or masses Cardiac: RRR; no murmurs, rubs, or gallops,no edema  Respiratory:  clear to auscultation bilaterally, normal work of breathing GI: soft, nontender, nondistended, + BS MS: no deformity or atrophy  Skin: warm and dry  Neuro:  Strength and sensation are intact Psych: euthymic mood, full affect  EKG:  EKG  is ordered today. The ekg ordered today shows sinus rhythm 53 bpm   Recent Labs: 06/11/2015: ALT 22; BUN 13; Creatinine, Ser 0.73; Hemoglobin 14.9; Platelets 207.0; Potassium 3.6; Sodium 142; TSH 1.98    Lipid Panel     Component Value Date/Time   CHOL 169 06/11/2015 1509   TRIG 229.0 (H) 06/11/2015 1509   HDL 45.40 06/11/2015 1509   CHOLHDL 4 06/11/2015 1509   VLDL 45.8 (H) 06/11/2015 1509   LDLCALC 75 05/11/2014 0909   LDLDIRECT 95.0 06/11/2015 1509     Wt Readings from Last 3 Encounters:  10/29/15 221 lb 12.8 oz (100.6 kg)  09/18/15 223 lb 8 oz (101.4 kg)  08/21/15 226 lb (102.5 kg)      Other studies Reviewed: Additional studies/ records that were reviewed today include: recent echo (severe LA enlargement), sleep study, and ett reviewed.   ASSESSMENT AND PLAN:  1.  Paroxysmal atrial fibrillation The patient has symptomatic atrial fibrillation.  She is currently doing well with flecainide 50mg  BID despite severe LA enlargement.  She will try to reduce metoprolol to 50mg  daily. She is aware that she can take an additional flecainide or metoprolol should she have an episode of AF.  chads2vasc score is at least 3.  She is appropriately anticoagulated with eliqus. She has been very diligent with lifestyle modification and has lost 9 lbs She is exercising regularly.  2. Morbid obesity Body mass index is 39.29 kg/m. Lifestyle modification including regular exercise and weight reduction were discussed at length I am very pleased with her progress!  3. HTN Elevated today She reports good BP control at home.  Follow-up:  In AF clinic with Butch Penny in 3 months  I will see again in 6 months  Current medicines are reviewed at length with the patient today.   The patient does not have concerns regarding her medicines.  The following changes were made today:  none    Signed, Thompson Grayer, MD  10/29/2015 5:11 PM     [address]  [place] [address] [phone] (office) [phone] (fax)

## 2015-10-29 NOTE — Patient Instructions (Signed)
Medication Instructions:  Your physician has recommended you make the following change in your medication:  1) Try decreasing the Toprol to 50 mg daily---call back and let me know if you decided to stay on decreased dose or if going to keep it at 100 mg daily   Labwork: None ordered   Testing/Procedures: None ordered   Follow-Up: Your physician recommends that you schedule a follow-up appointment in: 3 months with Roderic Palau, NP and 6 months with Dr Rayann Heman   Any Other Special Instructions Will Be Listed Below (If Applicable).     If you need a refill on your cardiac medications before your next appointment, please call your pharmacy.

## 2015-12-09 ENCOUNTER — Other Ambulatory Visit: Payer: Self-pay | Admitting: Cardiovascular Disease

## 2015-12-14 ENCOUNTER — Telehealth: Payer: Self-pay | Admitting: *Deleted

## 2015-12-14 ENCOUNTER — Encounter: Payer: Self-pay | Admitting: Internal Medicine

## 2015-12-14 ENCOUNTER — Other Ambulatory Visit (INDEPENDENT_AMBULATORY_CARE_PROVIDER_SITE_OTHER): Payer: 59

## 2015-12-14 DIAGNOSIS — E785 Hyperlipidemia, unspecified: Secondary | ICD-10-CM | POA: Diagnosis not present

## 2015-12-14 DIAGNOSIS — E559 Vitamin D deficiency, unspecified: Secondary | ICD-10-CM | POA: Diagnosis not present

## 2015-12-14 DIAGNOSIS — R7989 Other specified abnormal findings of blood chemistry: Secondary | ICD-10-CM

## 2015-12-14 DIAGNOSIS — R739 Hyperglycemia, unspecified: Secondary | ICD-10-CM

## 2015-12-14 DIAGNOSIS — I10 Essential (primary) hypertension: Secondary | ICD-10-CM | POA: Diagnosis not present

## 2015-12-14 LAB — LIPID PANEL
CHOL/HDL RATIO: 4
Cholesterol: 165 mg/dL (ref 0–200)
HDL: 40.7 mg/dL (ref >39.00)
NONHDL: 124.11
TRIGLYCERIDES: 218 mg/dL — AB (ref 0.0–149.0)
VLDL: 43.6 mg/dL — ABNORMAL HIGH (ref 0.0–40.0)

## 2015-12-14 LAB — BASIC METABOLIC PANEL
BUN: 16 mg/dL (ref 6–23)
CHLORIDE: 105 meq/L (ref 96–112)
CO2: 28 meq/L (ref 19–32)
CREATININE: 0.78 mg/dL (ref 0.40–1.20)
Calcium: 9.7 mg/dL (ref 8.4–10.5)
GFR: 78.48 mL/min (ref >60.00)
Glucose, Bld: 96 mg/dL (ref 70–99)
POTASSIUM: 4 meq/L (ref 3.5–5.1)
Sodium: 143 mEq/L (ref 135–145)

## 2015-12-14 LAB — HEPATIC FUNCTION PANEL
ALK PHOS: 57 U/L (ref 39–117)
ALT: 13 U/L (ref 0–35)
AST: 13 U/L (ref 0–37)
Albumin: 4.2 g/dL (ref 3.5–5.2)
BILIRUBIN DIRECT: 0.2 mg/dL (ref 0.0–0.3)
BILIRUBIN TOTAL: 1 mg/dL (ref 0.2–1.2)
Total Protein: 6.9 g/dL (ref 6.0–8.3)

## 2015-12-14 LAB — VITAMIN D 25 HYDROXY (VIT D DEFICIENCY, FRACTURES): VITD: 23.19 ng/mL — ABNORMAL LOW (ref 30.00–100.00)

## 2015-12-14 LAB — HEMOGLOBIN A1C: Hgb A1c MFr Bld: 5.6 % (ref 4.6–6.5)

## 2015-12-14 LAB — LDL CHOLESTEROL, DIRECT: LDL DIRECT: 97 mg/dL

## 2015-12-14 NOTE — Telephone Encounter (Signed)
Pt rechecked appt time and date

## 2015-12-19 ENCOUNTER — Ambulatory Visit (INDEPENDENT_AMBULATORY_CARE_PROVIDER_SITE_OTHER): Payer: 59 | Admitting: Internal Medicine

## 2015-12-19 ENCOUNTER — Encounter: Payer: Self-pay | Admitting: Internal Medicine

## 2015-12-19 VITALS — BP 130/70 | HR 52 | Temp 97.9°F | Ht 63.0 in | Wt 218.8 lb

## 2015-12-19 DIAGNOSIS — Z23 Encounter for immunization: Secondary | ICD-10-CM | POA: Diagnosis not present

## 2015-12-19 DIAGNOSIS — I480 Paroxysmal atrial fibrillation: Secondary | ICD-10-CM | POA: Diagnosis not present

## 2015-12-19 DIAGNOSIS — E785 Hyperlipidemia, unspecified: Secondary | ICD-10-CM

## 2015-12-19 DIAGNOSIS — Z7189 Other specified counseling: Secondary | ICD-10-CM

## 2015-12-19 DIAGNOSIS — E6609 Other obesity due to excess calories: Secondary | ICD-10-CM

## 2015-12-19 DIAGNOSIS — E559 Vitamin D deficiency, unspecified: Secondary | ICD-10-CM

## 2015-12-19 DIAGNOSIS — E039 Hypothyroidism, unspecified: Secondary | ICD-10-CM | POA: Diagnosis not present

## 2015-12-19 DIAGNOSIS — I10 Essential (primary) hypertension: Secondary | ICD-10-CM

## 2015-12-19 DIAGNOSIS — Z7184 Encounter for health counseling related to travel: Secondary | ICD-10-CM

## 2015-12-19 DIAGNOSIS — F419 Anxiety disorder, unspecified: Secondary | ICD-10-CM

## 2015-12-19 DIAGNOSIS — Z6838 Body mass index (BMI) 38.0-38.9, adult: Secondary | ICD-10-CM

## 2015-12-19 NOTE — Progress Notes (Signed)
Patient ID: Bonnie Shaw, female   DOB: 11/15/1950, 65 y.o.   MRN: [id number]   Subjective:    Patient ID: Bonnie Shaw, female    DOB: Mar 19, 1950, 65 y.o.   MRN: [id number]  HPI  Patient here for a scheduled follow up.  Just evaluated by Dr Rayann Heman.  Has a history of afib.  S/p ablation.  Recently placed on flecainide.  Has been doing better since being on flecainide.  Had negative ETT.  No OSA.  He decreased her metoprolol to 50mg  q day.   She decreased the dose for one day.  Had an episode of afib and went back to bid dosing.  Has been on this dose since.  No significant afib since.  No chest pain.  She is exercising.  Adjusted her diet.  Feels better.  Planning to travel to [place].  No nausea or vomiting.  Bowels stable.  Tries to stay active.  Discussed diet and exercise.    Past Medical History:  Diagnosis Date  . Anxiety   . Cataract   . Hyperlipidemia   . Hypertension   . Hyperthyroidism    s/p RAI therapy  . Obesity   . Palpitations   . Panic disorder   . Paroxysmal atrial fibrillation (HCC)   . Snoring    Past Surgical History:  Procedure Laterality Date  . ABDOMINAL HYSTERECTOMY    . CATARACT EXTRACTION    . KNEE SURGERY     left knee  . THYROID ABLATION  2010   Family History  Problem Relation Age of Onset  . Alzheimer's disease Father   . Stroke Mother   . Cancer Sister     ovarian   . Ovarian cancer Sister 39   Social History   Social History  . Marital status: Married    Spouse name: N/A  . Number of children: 4  . Years of education: N/A   Occupational History  .  Unemployed   Social History Main Topics  . Smoking status: Never Smoker  . Smokeless tobacco: Never Used  . Alcohol use No  . Drug use: No  . Sexual activity: Not Asked   Other Topics Concern  . 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 atorvastatin (LIPITOR) 20 MG tablet Take 1 tablet (20 mg total) by mouth daily.  Marland Kitchen ELIQUIS 5 MG TABS tablet TAKE 1 TABLET(5 MG) BY MOUTH TWICE DAILY  . flecainide (TAMBOCOR) 50 MG tablet Take 1 tablet (50 mg total) by mouth 2 (two) times daily.  . hydrochlorothiazide (HYDRODIURIL) 25 MG tablet Take 25 mg by mouth daily.  Marland Kitchen levothyroxine (SYNTHROID, LEVOTHROID) 50 MCG tablet Take 1 tablet by mouth  daily  . metoprolol succinate (TOPROL-XL) 100 MG 24 hr tablet Take 1 tablet (100 mg total) by mouth daily. Take with or immediately following a meal.  . potassium chloride (K-DUR,KLOR-CON) 10 MEQ tablet Take 1 tablet (10 mEq total) by mouth daily.  . sertraline (ZOLOFT) 50 MG tablet Take 1 tablet (50 mg total) by mouth daily.   No facility-administered encounter medications on file as of 12/19/2015.     Review of Systems  Constitutional: Negative for  appetite change and unexpected weight change.  HENT: Negative for congestion and sinus pressure.   Respiratory: Negative for cough, chest tightness and shortness of breath.   Cardiovascular: Negative for chest pain, palpitations and leg swelling.  Gastrointestinal: Negative for abdominal pain, diarrhea, nausea and vomiting.  Genitourinary: Negative for difficulty urinating and dysuria.  Musculoskeletal: Negative for back pain and joint swelling.  Skin: Negative for color change and rash.  Neurological: Negative for dizziness, light-headedness and headaches.  Psychiatric/Behavioral: Negative for agitation and dysphoric mood.       Objective:     Blood pressure rechecked by me:  122/70  Physical Exam  Constitutional: She appears well-developed and well-nourished. No distress.  HENT:  Nose: Nose normal.  Mouth/Throat: Oropharynx is clear and moist.  Neck: Neck supple. No thyromegaly present.  Cardiovascular: Normal rate and  regular rhythm.   Pulmonary/Chest: Breath sounds normal. No respiratory distress. She has no wheezes.  Abdominal: Soft. Bowel sounds are normal. There is no tenderness.  Musculoskeletal: She exhibits no edema or tenderness.  Lymphadenopathy:    She has no cervical adenopathy.  Skin: No rash noted. No erythema.  Psychiatric: She has a normal mood and affect.    BP 130/70   Pulse (!) 52   Temp 97.9 F (36.6 C) (Oral)   Ht 5\' 3"  (1.6 m)   Wt 218 lb 12.8 oz (99.2 kg)   SpO2 98%   BMI 38.76 kg/m  Wt Readings from Last 3 Encounters:  12/19/15 218 lb 12.8 oz (99.2 kg)  10/29/15 221 lb 12.8 oz (100.6 kg)  09/18/15 223 lb 8 oz (101.4 kg)     Lab Results  Component Value Date   WBC 9.2 06/11/2015   HGB 14.9 06/11/2015   HCT 43.7 06/11/2015   PLT 207.0 06/11/2015   GLUCOSE 96 12/14/2015   CHOL 165 12/14/2015   TRIG 218.0 (H) 12/14/2015   HDL 40.70 12/14/2015   LDLDIRECT 97.0 12/14/2015   LDLCALC 75 05/11/2014   ALT 13 12/14/2015   AST 13 12/14/2015   NA 143 12/14/2015   K 4.0 12/14/2015   CL 105 12/14/2015   CREATININE 0.78 12/14/2015   BUN 16 12/14/2015   CO2 28 12/14/2015   TSH 1.98 06/11/2015   HGBA1C 5.6 12/14/2015   MICROALBUR <0.7 06/11/2015    Mm Screening Breast Tomo Bilateral  Result Date: 09/11/2015 CLINICAL DATA:  Screening. EXAM: 2D DIGITAL SCREENING BILATERAL MAMMOGRAM WITH CAD AND ADJUNCT TOMO COMPARISON:  Previous exam(s). ACR Breast Density Category b: There are scattered areas of fibroglandular density. FINDINGS: There are no findings suspicious for malignancy. Images were processed with CAD. IMPRESSION: No mammographic evidence of malignancy. A result letter of this screening mammogram will be mailed directly to the patient. RECOMMENDATION: Screening mammogram in one year. (Code:SM-B-01Y) BI-RADS CATEGORY  1: Negative. Electronically Signed   By: Altamese Cabal M.D.   On: 09/11/2015 11:38       Assessment & Plan:   Problem List Items Addressed This  Visit    Anxiety    Doing well on sertraline.  Follow.       Atrial fibrillation ([place])    On flecanide.  Back on bid metoprolol.  No significant afib since.  Follow.        Hyperlipidemia    On lipitor.  Low cholesterol diet.  Information given  - [facility] lipid diet.  Follow.        Relevant Orders   Lipid panel   Hypertension    Blood  pressure under good control.  Continue same medication regimen.  Follow pressures.  Follow metabolic panel.        Relevant Orders   Hepatic function panel   Hemoglobin [id number]   Basic metabolic panel   Hypothyroidism    On thyroid replacement.  Follow tsh.       Obesity    Discussed diet and exercise.  Follow.        Vitamin D deficiency    Discussed vitamin D supplements.  Follow.         Other Visit Diagnoses    Need for vaccination    -  Primary   Relevant Orders   Pneumococcal polysaccharide vaccine 23-valent greater than or equal to 2yo subcutaneous/IM (Completed)   Counseling about travel       planning to travel to [place].  advised to go to the travel clinic in [place].         Einar Pheasant, MD

## 2015-12-19 NOTE — Progress Notes (Signed)
Pre visit review using our clinic review tool, if applicable. No additional management support is needed unless otherwise documented below in the visit note. 

## 2015-12-30 ENCOUNTER — Encounter: Payer: Self-pay | Admitting: Internal Medicine

## 2015-12-30 DIAGNOSIS — E559 Vitamin D deficiency, unspecified: Secondary | ICD-10-CM | POA: Insufficient documentation

## 2015-12-30 NOTE — Assessment & Plan Note (Signed)
Blood pressure under good control.  Continue same medication regimen.  Follow pressures.  Follow metabolic panel.   

## 2015-12-30 NOTE — Assessment & Plan Note (Signed)
On flecanide.  Back on bid metoprolol.  No significant afib since.  Follow.

## 2015-12-30 NOTE — Assessment & Plan Note (Signed)
Doing well on sertraline.  Follow.   

## 2015-12-30 NOTE — Assessment & Plan Note (Signed)
Discussed diet and exercise.  Follow.  

## 2015-12-30 NOTE — Assessment & Plan Note (Signed)
On lipitor.  Low cholesterol diet.  Information given  - [facility] lipid diet.  Follow.

## 2015-12-30 NOTE — Assessment & Plan Note (Signed)
On thyroid replacement.  Follow tsh.  

## 2015-12-30 NOTE — Assessment & Plan Note (Signed)
Discussed vitamin D supplements.  Follow.

## 2016-01-24 ENCOUNTER — Telehealth: Payer: Self-pay | Admitting: *Deleted

## 2016-01-24 ENCOUNTER — Other Ambulatory Visit: Payer: Self-pay | Admitting: Internal Medicine

## 2016-01-24 NOTE — Telephone Encounter (Signed)
Patient requested a medication for hydrochlorothiazide  Pharmacy [facility]

## 2016-01-24 NOTE — Telephone Encounter (Signed)
Historical medication. Pt last seen 12/19/15. Please advise?

## 2016-01-25 ENCOUNTER — Other Ambulatory Visit: Payer: Self-pay

## 2016-01-25 MED ORDER — HYDROCHLOROTHIAZIDE 25 MG PO TABS: 25.0000 mg | ORAL_TABLET | Freq: Every day | ORAL | 0 refills | Status: DC

## 2016-01-25 NOTE — Telephone Encounter (Signed)
I had sent a note to someone about confirming with pt that she is taking this regularly.  This is a former pt of Dr Gilford Rile that has established with me.  If taking regularly ok to refill x 2.

## 2016-01-25 NOTE — Telephone Encounter (Signed)
Spoke with patient and she stated that someone has already spoken with her about this. After looking in chart Ashleigh had called the patient to discuss and Janett Billow had sent the medication in.

## 2016-01-25 NOTE — Telephone Encounter (Signed)
If this was in regard to BP medication pt takes it daily and 25 mg. rx was already sent by Janett Billow.

## 2016-01-25 NOTE — Telephone Encounter (Signed)
Reviewed.  This is a previous Dr Gilford Rile pt who I am now seeing.  Please clarify with pt if she is still taking the medication and if taking regularly.  If so, ok to refill x 2

## 2016-01-25 NOTE — Telephone Encounter (Signed)
Please advise Historical provider. Last seen 12/19/15

## 2016-01-29 ENCOUNTER — Other Ambulatory Visit: Payer: Self-pay | Admitting: Internal Medicine

## 2016-01-29 NOTE — Telephone Encounter (Signed)
The patient is needing a refill on her HCTZ sent to [facility] for 90 supply. She has just refilled this medication at the pharmacy for 30 days she will be going out of the country in 2 weeks and she will need the additional medication for the trip. She will need all her other prescriptions sent to her mail order for 90 day refill.

## 2016-01-31 ENCOUNTER — Inpatient Hospital Stay (HOSPITAL_COMMUNITY): Admission: RE | Admit: 2016-01-31 | Payer: 59 | Source: Ambulatory Visit | Admitting: Nurse Practitioner

## 2016-01-31 NOTE — Telephone Encounter (Signed)
Patient had this filled for 90 days on the 22nd, please advise.

## 2016-01-31 NOTE — Telephone Encounter (Signed)
Please notify pt that a 90 day rx of hctz was sent to her pharmacy on 01/25/16.  She should not need another rx sent to her pharmacy.  Let me know if a problem.  Once clarified, send back to me and I will send in her other rx.  Need to confirm we have correct mail order pharmacy.

## 2016-02-04 DIAGNOSIS — G610 Guillain-Barre syndrome: Secondary | ICD-10-CM

## 2016-02-04 HISTORY — DX: Guillain-Barre syndrome: G61.0

## 2016-02-06 ENCOUNTER — Ambulatory Visit (HOSPITAL_COMMUNITY)
Admission: RE | Admit: 2016-02-06 | Discharge: 2016-02-06 | Disposition: A | Discharge status: Home / Self Care | Payer: 59 | Source: Ambulatory Visit | Attending: Nurse Practitioner | Admitting: Nurse Practitioner

## 2016-02-06 ENCOUNTER — Encounter (HOSPITAL_COMMUNITY): Payer: Self-pay | Admitting: Nurse Practitioner

## 2016-02-06 VITALS — BP 126/72 | HR 45 | Ht 63.0 in | Wt 212.8 lb

## 2016-02-06 DIAGNOSIS — R001 Bradycardia, unspecified: Secondary | ICD-10-CM | POA: Insufficient documentation

## 2016-02-06 DIAGNOSIS — I10 Essential (primary) hypertension: Secondary | ICD-10-CM | POA: Diagnosis not present

## 2016-02-06 DIAGNOSIS — I480 Paroxysmal atrial fibrillation: Secondary | ICD-10-CM | POA: Diagnosis present

## 2016-02-06 DIAGNOSIS — E0590 Thyrotoxicosis, unspecified without thyrotoxic crisis or storm: Secondary | ICD-10-CM | POA: Insufficient documentation

## 2016-02-06 DIAGNOSIS — F419 Anxiety disorder, unspecified: Secondary | ICD-10-CM | POA: Insufficient documentation

## 2016-02-06 DIAGNOSIS — E785 Hyperlipidemia, unspecified: Secondary | ICD-10-CM | POA: Insufficient documentation

## 2016-02-06 DIAGNOSIS — Z7901 Long term (current) use of anticoagulants: Secondary | ICD-10-CM | POA: Insufficient documentation

## 2016-02-06 DIAGNOSIS — E669 Obesity, unspecified: Secondary | ICD-10-CM | POA: Diagnosis not present

## 2016-02-06 NOTE — Progress Notes (Signed)
Patient ID: Bonnie Shaw, female   DOB: [birth year], 66 y.o.   MRN: [id number]     Primary Care Physician: Einar Pheasant, MD Referring Physician: Dr. Donnie Mesa is a 66 y.o. female with a h/o PAF that was started on flecainide by Dr. Rayann Heman latter part of June. She is in Afib clinic today for follow up. She has not had any further afib and doring well tolerating flecainide. HR is usually in 50's at home, tolerating without any dizziness.  Has been on metoprolol for years for BP issues at 100 mg a day. She has had a sleep study since seen here and was negative. Echo showed enlargement of left atrium of 50 mm. Despite this, she has done well with minimum breakthrough afib. She is getting ready to leave for [place] for a mission trip with her husband. She is losing weight and is feeling better.    Today, she denies symptoms of palpitations, chest pain, shortness of breath, orthopnea, PND, lower extremity edema, dizziness, presyncope, syncope, or neurologic sequela. The patient is tolerating medications without difficulties and is otherwise without complaint today.   Past Medical History:  Diagnosis Date  . Anxiety   . Cataract   . Hyperlipidemia   . Hypertension   . Hyperthyroidism    s/p RAI therapy  . Obesity   . Palpitations   . Panic disorder   . Paroxysmal atrial fibrillation (HCC)   . Snoring    Past Surgical History:  Procedure Laterality Date  . ABDOMINAL HYSTERECTOMY    . CATARACT EXTRACTION    . KNEE SURGERY     left knee  . THYROID ABLATION  2010    Current Outpatient Prescriptions  Medication Sig Dispense Refill  . atorvastatin (LIPITOR) 20 MG tablet Take 1 tablet (20 mg total) by mouth daily. 90 tablet 1  . ELIQUIS 5 MG TABS tablet TAKE 1 TABLET(5 MG) BY MOUTH TWICE DAILY 180 tablet 3  . flecainide (TAMBOCOR) 50 MG tablet Take 1 tablet (50 mg total) by mouth 2 (two) times daily. 180 tablet 3  . hydrochlorothiazide (HYDRODIURIL) 25 MG tablet Take 1 tablet  (25 mg total) by mouth daily. 90 tablet 0  . levothyroxine (SYNTHROID, LEVOTHROID) 50 MCG tablet Take 1 tablet by mouth  daily 90 tablet 1  . metoprolol succinate (TOPROL-XL) 100 MG 24 hr tablet Take 1 tablet (100 mg total) by mouth daily. Take with or immediately following a meal. 90 tablet 1  . potassium chloride (K-DUR,KLOR-CON) 10 MEQ tablet Take 1 tablet (10 mEq total) by mouth daily. 90 tablet 1  . sertraline (ZOLOFT) 50 MG tablet Take 1 tablet (50 mg total) by mouth daily. 90 tablet 3   No current facility-administered medications for this encounter.     Allergies  Allergen Reactions  . Sulfonamide Derivatives     Unknown reaction    Social History   Social History  . Marital status: Married    Spouse name: N/A  . Number of children: 4  . Years of education: N/A   Occupational History  .  Unemployed   Social History Main Topics  . Smoking status: Never Smoker  . Smokeless tobacco: Never Used  . Alcohol use No  . Drug use: No  . Sexual activity: Not on file   Other Topics Concern  . 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 Alzheimer's disease Father   . Stroke Mother   . Cancer Sister     ovarian   . Ovarian cancer Sister 37    ROS- All systems are reviewed and negative except as per the HPI above  Physical Exam: Vitals:   02/06/16 0933  BP: 126/72  Pulse: (!) 45  Weight: 212 lb 12.8 oz (96.5 kg)  Height: 5\' 3"  (1.6 m)    GEN- The patient is well appearing, alert and oriented x 3 today.   Head- normocephalic, atraumatic Eyes-  Sclera clear, conjunctiva pink Ears- hearing intact Oropharynx- clear Neck- supple, no JVP Lymph- no cervical lymphadenopathy Lungs- Clear to ausculation bilaterally, normal work of  breathing Heart- Regular rate and rhythm, no murmurs, rubs or gallops, PMI not laterally displaced GI- soft, NT, ND, + BS Extremities- no clubbing, cyanosis, or edema MS- no significant deformity or atrophy Skin- no rash or lesion Psych- euthymic mood, full affect Neuro- strength and sensation are intact  EKG- Sinus brady at 45 bpm, pr int 164 ms, qrs int 70 ms, qtc 404 ms Epic records reviewed   Assessment and Plan: 1. Paroxymal afib Doing well on flecainide 50 mg bid with minimal byrden  Bradycardia, asymptomatic  On prior visit, Dr. Rayann Heman told her she could decrease metoprolol but she devoloed afib and went back to 100 mg bid of metoprolol If she becomes symptomatic with brady, would try to reduce to 75 mg bid Continue on eliquis 5 mg bid  2. HTN Stable  3. Obesity  Pt congratulated on weight loss   F/u with Dr. Rayann Heman in 6 months   [name]. Monifah Freehling, [facility] [address] [place], [place] [address] [phone]

## 2016-02-14 NOTE — Telephone Encounter (Signed)
Spoke with patient states she doesn't no longer 90 day supply for mail order.  It was pharmacy mistake.

## 2016-02-15 NOTE — Telephone Encounter (Signed)
So, do I need to do anything or does she have what she needs.  If I do not need to do anything, can close note.

## 2016-03-21 ENCOUNTER — Other Ambulatory Visit: Payer: 59

## 2016-04-04 ENCOUNTER — Other Ambulatory Visit (INDEPENDENT_AMBULATORY_CARE_PROVIDER_SITE_OTHER): Payer: 59

## 2016-04-04 DIAGNOSIS — I10 Essential (primary) hypertension: Secondary | ICD-10-CM

## 2016-04-04 DIAGNOSIS — E785 Hyperlipidemia, unspecified: Secondary | ICD-10-CM | POA: Diagnosis not present

## 2016-04-04 LAB — BASIC METABOLIC PANEL
BUN: 15 mg/dL (ref 6–23)
CHLORIDE: 105 meq/L (ref 96–112)
CO2: 30 meq/L (ref 19–32)
CREATININE: 0.73 mg/dL (ref 0.40–1.20)
Calcium: 9.2 mg/dL (ref 8.4–10.5)
GFR: 84.64 mL/min (ref >60.00)
Glucose, Bld: 97 mg/dL (ref 70–99)
Potassium: 3.9 mEq/L (ref 3.5–5.1)
Sodium: 141 mEq/L (ref 135–145)

## 2016-04-04 LAB — HEPATIC FUNCTION PANEL
ALBUMIN: 4 g/dL (ref 3.5–5.2)
ALT: 11 U/L (ref 0–35)
AST: 11 U/L (ref 0–37)
Alkaline Phosphatase: 55 U/L (ref 39–117)
Bilirubin, Direct: 0.2 mg/dL (ref 0.0–0.3)
Total Bilirubin: 1 mg/dL (ref 0.2–1.2)
Total Protein: 6.6 g/dL (ref 6.0–8.3)

## 2016-04-04 LAB — LIPID PANEL
CHOLESTEROL: 166 mg/dL (ref 0–200)
HDL: 41.7 mg/dL (ref >39.00)
LDL Cholesterol: 89 mg/dL (ref 0–99)
NonHDL: 123.8
Total CHOL/HDL Ratio: 4
Triglycerides: 176 mg/dL — ABNORMAL HIGH (ref 0.0–149.0)
VLDL: 35.2 mg/dL (ref 0.0–40.0)

## 2016-04-04 LAB — HEMOGLOBIN A1C: HEMOGLOBIN A1C: 5.8 % (ref 4.6–6.5)

## 2016-04-07 ENCOUNTER — Encounter: Payer: Self-pay | Admitting: Internal Medicine

## 2016-04-09 ENCOUNTER — Ambulatory Visit (INDEPENDENT_AMBULATORY_CARE_PROVIDER_SITE_OTHER): Payer: 59 | Admitting: Internal Medicine

## 2016-04-09 ENCOUNTER — Encounter: Payer: Self-pay | Admitting: Internal Medicine

## 2016-04-09 VITALS — BP 148/88 | HR 58 | Temp 98.2°F | Resp 16 | Ht 63.0 in | Wt 211.2 lb

## 2016-04-09 DIAGNOSIS — E039 Hypothyroidism, unspecified: Secondary | ICD-10-CM | POA: Diagnosis not present

## 2016-04-09 DIAGNOSIS — R739 Hyperglycemia, unspecified: Secondary | ICD-10-CM | POA: Diagnosis not present

## 2016-04-09 DIAGNOSIS — I480 Paroxysmal atrial fibrillation: Secondary | ICD-10-CM

## 2016-04-09 DIAGNOSIS — I10 Essential (primary) hypertension: Secondary | ICD-10-CM

## 2016-04-09 DIAGNOSIS — F419 Anxiety disorder, unspecified: Secondary | ICD-10-CM

## 2016-04-09 DIAGNOSIS — E785 Hyperlipidemia, unspecified: Secondary | ICD-10-CM

## 2016-04-09 MED ORDER — METOPROLOL SUCCINATE ER 25 MG PO TB24: ORAL_TABLET | ORAL | 3 refills | Status: DC

## 2016-04-09 NOTE — Progress Notes (Signed)
Patient ID: Bonnie Shaw, female   DOB: 1950/02/17, 66 y.o.   MRN: [id number]   Subjective:    Patient ID: Bonnie Shaw, female    DOB: February 28, 1950, 66 y.o.   MRN: [id number]  HPI  Patient here for a scheduled follow up.  She has a history of PAF.  Followed by Dr Rayann Heman.  Saw Roderic Palau 02/06/16.  She has been having problems with fatigue.  Felt related to increased metoprolol.  The metoprolol was initially cut in 1/2.  Noticed increased afib so started back on full dose.  After her last visit with Roderic Palau, she cut her dose in 1/2 again. She was concerned regarding low heart rate.  States she is having episodes of what she feels is afib. Mostly occurring in the evening.  No chest pain.  No sob.  No acid reflux.  No abdominal pain.  Bowel stable.    Past Medical History:  Diagnosis Date  . Anxiety   . Cataract   . Hyperlipidemia   . Hypertension   . Hyperthyroidism    s/p RAI therapy  . Obesity   . Palpitations   . Panic disorder   . Paroxysmal atrial fibrillation (HCC)   . Snoring    Past Surgical History:  Procedure Laterality Date  . ABDOMINAL HYSTERECTOMY    . CATARACT EXTRACTION    . KNEE SURGERY     left knee  . THYROID ABLATION  2010   Family History  Problem Relation Age of Onset  . Alzheimer's disease Father   . Stroke Mother   . Cancer Sister     ovarian   . Ovarian cancer Sister 65   Social History   Social History  . Marital status: Married    Spouse name: N/A  . Number of children: 4  . Years of education: N/A   Occupational History  .  Unemployed   Social History Main Topics  . Smoking status: Never Smoker  . Smokeless tobacco: Never Used  . Alcohol use No  . Drug use: No  . Sexual activity: Not Asked   Other Topics Concern  . 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 atorvastatin (LIPITOR) 20 MG tablet Take 1 tablet (20 mg total) by mouth daily.  Marland Kitchen ELIQUIS 5 MG TABS tablet TAKE 1 TABLET(5 MG) BY MOUTH TWICE DAILY  . flecainide (TAMBOCOR) 50 MG tablet Take 1 tablet (50 mg total) by mouth 2 (two) times daily.  . hydrochlorothiazide (HYDRODIURIL) 25 MG tablet Take 1 tablet (25 mg total) by mouth daily.  Marland Kitchen levothyroxine (SYNTHROID, LEVOTHROID) 50 MCG tablet Take 1 tablet by mouth  daily  . potassium chloride (K-DUR,KLOR-CON) 10 MEQ tablet Take 1 tablet (10 mEq total) by mouth daily.  . sertraline (ZOLOFT) 50 MG tablet Take 1 tablet (50 mg total) by mouth daily.  . [DISCONTINUED] metoprolol succinate (TOPROL-XL) 100 MG 24 hr tablet Take 1 tablet (100 mg total) by mouth daily. Take with or immediately following a meal. (Patient taking differently: Take 50 mg by mouth daily. Take with or immediately following a meal.)  . metoprolol succinate (TOPROL-XL) 25 MG 24 hr tablet Three tablets  q day   No facility-administered encounter medications on file as of 04/09/2016.     Review of Systems  Constitutional: Positive for fatigue. Negative for appetite change and unexpected weight change.  HENT: Negative for congestion and sinus pressure.   Respiratory: Negative for cough, chest tightness and shortness of breath.   Cardiovascular: Negative for chest pain, palpitations and leg swelling.  Gastrointestinal: Negative for abdominal pain, diarrhea, nausea and vomiting.  Genitourinary: Negative for difficulty urinating and dysuria.  Musculoskeletal: Negative for back pain and joint swelling.  Skin: Negative for color change and rash.  Neurological: Negative for dizziness, light-headedness and headaches.  Psychiatric/Behavioral: Negative for agitation and dysphoric mood.       Objective:     Blood pressure rechecked by me:  140/82  Physical Exam  Constitutional: She appears  well-developed and well-nourished. No distress.  HENT:  Nose: Nose normal.  Mouth/Throat: Oropharynx is clear and moist.  Neck: Neck supple. No thyromegaly present.  Cardiovascular: Normal rate and regular rhythm.   Pulmonary/Chest: Breath sounds normal. No respiratory distress. She has no wheezes.  Abdominal: Soft. Bowel sounds are normal. There is no tenderness.  Musculoskeletal: She exhibits no edema or tenderness.  Lymphadenopathy:    She has no cervical adenopathy.  Skin: No rash noted. No erythema.  Psychiatric: She has a normal mood and affect. Her behavior is normal.    BP (!) 148/88 (BP Location: Left Arm, Patient Position: Sitting, Cuff Size: Large)   Pulse (!) 58   Temp 98.2 F (36.8 C) (Oral)   Resp 16   Ht 5\' 3"  (1.6 m)   Wt 211 lb 3.2 oz (95.8 kg)   SpO2 97%   BMI 37.41 kg/m  Wt Readings from Last 3 Encounters:  04/09/16 211 lb 3.2 oz (95.8 kg)  02/06/16 212 lb 12.8 oz (96.5 kg)  12/19/15 218 lb 12.8 oz (99.2 kg)     Lab Results  Component Value Date   WBC 9.2 06/11/2015   HGB 14.9 06/11/2015   HCT 43.7 06/11/2015   PLT 207.0 06/11/2015   GLUCOSE 97 04/04/2016   CHOL 166 04/04/2016   TRIG 176.0 (H) 04/04/2016   HDL 41.70 04/04/2016   LDLDIRECT 97.0 12/14/2015   LDLCALC 89 04/04/2016   ALT 11 04/04/2016   AST 11 04/04/2016   NA 141 04/04/2016   K 3.9 04/04/2016   CL 105 04/04/2016   CREATININE 0.73 04/04/2016   BUN 15 04/04/2016   CO2 30 04/04/2016   TSH 1.98 06/11/2015   HGBA1C 5.8 04/04/2016   MICROALBUR <0.7 06/11/2015       Assessment & Plan:   Problem List Items Addressed This Visit    Anxiety    Doing well on sertraline.  Follow.        Atrial fibrillation ([place])    On flecainide.  On metoprolol.  Feels 100mg  metoprolol causes fatigue.  Increased afib with 50mg  q day.  Will have her take 75mg  per day.  Follow.  Has f/u with Dr Rayann Heman in 2 weeks.  Follow.       Relevant Medications   metoprolol succinate (TOPROL-XL) 25 MG 24 hr  tablet   Hyperlipidemia    Low cholesterol diet and exercise.  Follow lipid panel and liver function tests.  On lipitor.        Relevant Medications   metoprolol succinate (TOPROL-XL) 25 MG 24 hr tablet   Other Relevant Orders   Lipid panel   Hepatic function panel   Hypertension  Blood pressure as outlined.  Concern regarding intermittent afib.  Adjust metoprolol as outlined.   Follow pressures.  Follow metabolic panel.        Relevant Medications   metoprolol succinate (TOPROL-XL) 25 MG 24 hr tablet   Other Relevant Orders   CBC with Differential/Platelet   Basic metabolic panel   Hypothyroidism    On thyroid replacement.  Follow tsh.        Relevant Medications   metoprolol succinate (TOPROL-XL) 25 MG 24 hr tablet   Other Relevant Orders   TSH    Other Visit Diagnoses    Hyperglycemia    -  Primary   Relevant Orders   Hemoglobin A1c       Einar Pheasant, MD

## 2016-04-09 NOTE — Progress Notes (Signed)
Pre-visit discussion using our clinic review tool. No additional management support is needed unless otherwise documented below in the visit note.  

## 2016-04-14 ENCOUNTER — Encounter: Payer: Self-pay | Admitting: Internal Medicine

## 2016-04-14 NOTE — Assessment & Plan Note (Signed)
On thyroid replacement.  Follow tsh.  

## 2016-04-14 NOTE — Assessment & Plan Note (Signed)
Doing well on sertraline.  Follow.

## 2016-04-14 NOTE — Assessment & Plan Note (Signed)
Low cholesterol diet and exercise.  Follow lipid panel and liver function tests.  On lipitor.   

## 2016-04-14 NOTE — Assessment & Plan Note (Signed)
Blood pressure as outlined.  Concern regarding intermittent afib.  Adjust metoprolol as outlined.   Follow pressures.  Follow metabolic panel.

## 2016-04-14 NOTE — Assessment & Plan Note (Signed)
On flecainide.  On metoprolol.  Feels 100mg  metoprolol causes fatigue.  Increased afib with 50mg  q day.  Will have her take 75mg  per day.  Follow.  Has f/u with Dr Rayann Heman in 2 weeks.  Follow.

## 2016-04-28 ENCOUNTER — Ambulatory Visit (INDEPENDENT_AMBULATORY_CARE_PROVIDER_SITE_OTHER): Payer: 59 | Admitting: Internal Medicine

## 2016-04-28 VITALS — BP 132/74 | HR 55 | Ht 63.0 in | Wt 214.4 lb

## 2016-04-28 DIAGNOSIS — I10 Essential (primary) hypertension: Secondary | ICD-10-CM

## 2016-04-28 DIAGNOSIS — I4891 Unspecified atrial fibrillation: Secondary | ICD-10-CM | POA: Diagnosis not present

## 2016-04-28 DIAGNOSIS — I480 Paroxysmal atrial fibrillation: Secondary | ICD-10-CM

## 2016-04-28 NOTE — Patient Instructions (Signed)
Medication Instructions:  None Ordered   Labwork: None Ordered   Testing/Procedures: None Ordered   Follow-Up: Your physician recommends that you schedule a follow-up appointment in: 3 months with Butch Penny from the Afib clinic  Any Other Special Instructions Will Be Listed Below (If Applicable).     If you need a refill on your cardiac medications before your next appointment, please call your pharmacy.

## 2016-04-28 NOTE — Progress Notes (Signed)
Electrophysiology Office Note   Date:  04/28/2016   ID:  Sammye, Staff 03/24/1950, MRN [id number]  PCP:  Einar Pheasant, MD  Cardiologist:  Previously has seen Dr Acie Fredrickson,  Plans to see Dr Dorene Ar. Primary Electrophysiologist: Thompson Grayer, MD    CC: afib   History of Present Illness: Bonnie Shaw is a 66 y.o. female who presents today for electrophysiology evaluation.   She reports initially being diagnosed with atrial fibrillation in 2010.  This was attributed to hyperthyroidism.  She underwent RAI ablation at that time.  She had some improvement in atrial fibrillation with this treatment.  Sleep study revealed no OSA. She has lost over 15 lbs. She is exercising regularly. She continues to have short palpitations (up to 20 minutes) in the evenings.  She was recently able to go to [place] without any afib.  Today, she denies symptoms of chest pain, shortness of breath, orthopnea, PND, lower extremity edema, claudication, dizziness, presyncope, syncope, bleeding, or neurologic sequela. The patient is tolerating medications without difficulties and is otherwise without complaint today.  She has back pain and anxiety.   Past Medical History:  Diagnosis Date  . Anxiety   . Cataract   . Hyperlipidemia   . Hypertension   . Hyperthyroidism    s/p RAI therapy  . Obesity   . Palpitations   . Panic disorder   . Paroxysmal atrial fibrillation (HCC)   . Snoring    Past Surgical History:  Procedure Laterality Date  . ABDOMINAL HYSTERECTOMY    . CATARACT EXTRACTION    . KNEE SURGERY     left knee  . THYROID ABLATION  2010     Current Outpatient Prescriptions  Medication Sig Dispense Refill  . atorvastatin (LIPITOR) 20 MG tablet Take 1 tablet (20 mg total) by mouth daily. 90 tablet 1  . ELIQUIS 5 MG TABS tablet TAKE 1 TABLET(5 MG) BY MOUTH TWICE DAILY 180 tablet 3  . flecainide (TAMBOCOR) 50 MG tablet Take 1 tablet (50 mg total) by mouth 2 (two) times daily. 180 tablet 3    . hydrochlorothiazide (HYDRODIURIL) 25 MG tablet Take 1 tablet (25 mg total) by mouth daily. 90 tablet 0  . levothyroxine (SYNTHROID, LEVOTHROID) 50 MCG tablet Take 1 tablet by mouth  daily 90 tablet 1  . metoprolol tartrate (LOPRESSOR) 25 MG tablet Take 25 mg by mouth 3 (three) times daily.    . potassium chloride (K-DUR,KLOR-CON) 10 MEQ tablet Take 1 tablet (10 mEq total) by mouth daily. 90 tablet 1  . sertraline (ZOLOFT) 50 MG tablet Take 1 tablet (50 mg total) by mouth daily. 90 tablet 3   No current facility-administered medications for this visit.     Allergies:   Meloxicam and Sulfonamide derivatives   Social History:  The patient  reports that she has never smoked. She has never used smokeless tobacco. She reports that she does not drink alcohol or use drugs.   Family History:  The patient's  family history includes Alzheimer's disease in her father; Cancer in her sister; Ovarian cancer (age of onset: 40) in her sister; Stroke in her mother.    ROS:  Please see the history of present illness.   All other systems are reviewed and negative.    PHYSICAL EXAM: VS:  BP 132/74   Pulse (!) 55   Ht 5\' 3"  (1.6 m)   Wt 214 lb 6 oz (97.2 kg)   SpO2 96%   BMI 37.97 kg/m  ,  BMI Body mass index is 37.97 kg/m. GEN: overweight, in no acute distress  HEENT: normal  Neck: no JVD, carotid bruits, or masses Cardiac: bradycardic regular rhythm; no murmurs, rubs, or gallops,no edema  Respiratory:  clear to auscultation bilaterally, normal work of breathing GI: soft, nontender, nondistended, + BS MS: no deformity or atrophy  Skin: warm and dry  Neuro:  Strength and sensation are intact Psych: euthymic mood, full affect  EKG:  EKG is ordered today. The ekg ordered today shows sinus rhythm 55 bpm, otherwise normal ekg   Recent Labs: 06/11/2015: Hemoglobin 14.9; Platelets 207.0; TSH 1.98 04/04/2016: ALT 11; BUN 15; Creatinine, Ser 0.73; Potassium 3.9; Sodium 141    Lipid Panel      Component Value Date/Time   CHOL 166 04/04/2016 0826   TRIG 176.0 (H) 04/04/2016 0826   HDL 41.70 04/04/2016 0826   CHOLHDL 4 04/04/2016 0826   VLDL 35.2 04/04/2016 0826   LDLCALC 89 04/04/2016 0826   LDLDIRECT 97.0 12/14/2015 0828     Wt Readings from Last 3 Encounters:  04/28/16 214 lb 6 oz (97.2 kg)  04/09/16 211 lb 3.2 oz (95.8 kg)  02/06/16 212 lb 12.8 oz (96.5 kg)      ASSESSMENT AND PLAN:  1.  Paroxysmal atrial fibrillation The patient has symptomatic atrial fibrillation.  She is currently doing well with flecainide 50mg  BID despite severe LA enlargement.  She is aware that she can take an additional flecainide or metoprolol should she have an episode of AF.  chads2vasc score is at least 3.  She is appropriately anticoagulated with eliqus. She has been very diligent with lifestyle modification and has lost over 15 lbs She is exercising regularly. Consider repeat echo upon follow-up in AF clinic IF LA size improves, could consider ablation in the future  2. Morbid obesity Body mass index is 37.97 kg/m. Lifestyle modification including regular exercise and weight reduction were discussed at length I am very pleased with her progress!  3. HTN Stable No change required today  Follow-up:  In AF clinic with Butch Penny every 3 months  Current medicines are reviewed at length with the patient today.   The patient does not have concerns regarding her medicines.  The following changes were made today:  none   Signed, Thompson Grayer, MD  04/28/2016 2:11 PM     [facility] [address] [place] [address] [phone] (office) [phone] (fax)

## 2016-05-16 ENCOUNTER — Ambulatory Visit (INDEPENDENT_AMBULATORY_CARE_PROVIDER_SITE_OTHER): Payer: 59 | Admitting: Podiatry

## 2016-05-16 DIAGNOSIS — M205X2 Other deformities of toe(s) (acquired), left foot: Secondary | ICD-10-CM

## 2016-05-16 DIAGNOSIS — M19072 Primary osteoarthritis, left ankle and foot: Secondary | ICD-10-CM

## 2016-05-18 NOTE — Progress Notes (Signed)
   Subjective: patient is a 66 year old female presenting today as a new patient complaining of aching, burning,dull left bunion pain that has been ongoing for several years. He also complains that her toes are crossing. Her pain is worse depending on different shoes she wears.    Objective/Physical Exam General: The patient is alert and oriented x3 in no acute distress.  Dermatology: Skin is warm, dry and supple bilateral lower extremities. Negative for open lesions or macerations.  Vascular: Palpable pedal pulses bilaterally. No edema or erythema noted. Capillary refill within normal limits.  Neurological: Epicritic and protective threshold grossly intact bilaterally.   Musculoskeletal Exam: Pain on palpation and range of motion noted to the first MPJ left foot. Pain on palpation also noted throughout the midfoot left.  Radiographic exam: Degenerative changes noted to the first MPJ left foot with diffuse degenerative joint disease and joint space narrowing diffusely throughout the left foot joints. Negative for fracture.  Assessment: #1 Hallux limitus left #2 midfoot arthritis right  #3 dorsal spurs right foot   Plan of Care:  #1 Patient was evaluated. #2 recommend good shoe gear with a wide toe box to alleviate symptoms #3 discussed future surgery including first MPJ arthrodesis left #4 return to clinic when necessary   Edrick Kins, DPM [facility]  Dr. Edrick Kins, [place]                                        [place], [place] [address]                Office [phone]  Fax [phone]

## 2016-05-27 ENCOUNTER — Other Ambulatory Visit: Payer: Self-pay | Admitting: Internal Medicine

## 2016-06-06 ENCOUNTER — Other Ambulatory Visit: Payer: Self-pay | Admitting: Internal Medicine

## 2016-07-01 ENCOUNTER — Encounter: Payer: Self-pay | Admitting: Internal Medicine

## 2016-07-01 NOTE — Telephone Encounter (Signed)
The only time I would have to work her in is 12:30 this Friday.

## 2016-07-01 NOTE — Telephone Encounter (Signed)
Please call pt and see how she is doing and see if she feels she needs to be seen within 3 days.  If so, then I will have to work in.  If ok and doing better, I can see her 07/07/16 (on hold spot) - 8:30 (I think).

## 2016-07-03 ENCOUNTER — Other Ambulatory Visit: Payer: 59

## 2016-07-04 ENCOUNTER — Ambulatory Visit (INDEPENDENT_AMBULATORY_CARE_PROVIDER_SITE_OTHER): Payer: 59 | Admitting: Internal Medicine

## 2016-07-04 ENCOUNTER — Ambulatory Visit (INDEPENDENT_AMBULATORY_CARE_PROVIDER_SITE_OTHER): Payer: 59

## 2016-07-04 ENCOUNTER — Encounter: Payer: Self-pay | Admitting: Internal Medicine

## 2016-07-04 VITALS — BP 154/96 | HR 48 | Temp 98.5°F | Resp 12 | Wt 209.1 lb

## 2016-07-04 DIAGNOSIS — R05 Cough: Secondary | ICD-10-CM

## 2016-07-04 DIAGNOSIS — J40 Bronchitis, not specified as acute or chronic: Secondary | ICD-10-CM

## 2016-07-04 DIAGNOSIS — R059 Cough, unspecified: Secondary | ICD-10-CM

## 2016-07-04 DIAGNOSIS — I480 Paroxysmal atrial fibrillation: Secondary | ICD-10-CM | POA: Diagnosis not present

## 2016-07-04 DIAGNOSIS — F419 Anxiety disorder, unspecified: Secondary | ICD-10-CM | POA: Diagnosis not present

## 2016-07-04 DIAGNOSIS — I10 Essential (primary) hypertension: Secondary | ICD-10-CM | POA: Diagnosis not present

## 2016-07-04 MED ORDER — CEFDINIR 300 MG PO CAPS: 300.0000 mg | ORAL_CAPSULE | Freq: Two times a day (BID) | ORAL | 0 refills | Status: DC

## 2016-07-04 MED ORDER — PREDNISONE 10 MG PO TABS: ORAL_TABLET | ORAL | 0 refills | Status: DC

## 2016-07-04 NOTE — Patient Instructions (Signed)
Saline nasal spray - flush nose at least 2-3x/day  flonase or nasacort nasal spray - 2 sprays each nostril one time per day.  Do this in the evening.    Robitussin DM twice a day as needed.    Examples of probiotics:  Florastor, culturelle or align.

## 2016-07-04 NOTE — Progress Notes (Signed)
Patient ID: Bonnie Shaw, female   DOB: 04-May-1950, 66 y.o.   MRN: [id number]   Subjective:    Patient ID: Bonnie Shaw, female    DOB: 07-22-1950, 65 y.o.   MRN: [id number]  HPI  Patient here as a work in with concerns regarding persistent cough and congestion.  She was seen at [facility] 06/29/16.  Notes reviewed.  Presented with increased cough and productive colored mucus.  Was given neb in the office.  Placed on amoxicillin and prednisone.  Was also given codeine cough syrup.  States she is still having increased cough and congestion.  Increased drainage.  Mucus is colored.  Still with some wheezing.  May feel some better, but persistent coughing fits.  She completes her prednisone today.  Noticed some blood tinge in the mucus (intermittently) over the last 24 hours.  What she is coughing up now has no blood.  No gross hemoptysis.  Eating, but has decreased appetite.  No nausea or vomiting.  No diarrhea.     Past Medical History:  Diagnosis Date  . Anxiety   . Cataract   . Hyperlipidemia   . Hypertension   . Hyperthyroidism    s/p RAI therapy  . Obesity   . Palpitations   . Panic disorder   . Paroxysmal atrial fibrillation (HCC)   . Snoring    Past Surgical History:  Procedure Laterality Date  . ABDOMINAL HYSTERECTOMY    . CATARACT EXTRACTION    . KNEE SURGERY     left knee  . THYROID ABLATION  2010   Family History  Problem Relation Age of Onset  . Alzheimer's disease Father   . Stroke Mother   . Cancer Sister        ovarian   . Ovarian cancer Sister 26   Social History   Social History  . Marital status: Married    Spouse name: N/A  . Number of children: 4  . Years of education: N/A   Occupational History  .  Unemployed   Social History Main Topics  . Smoking status: Never Smoker  . Smokeless tobacco: Never Used  . Alcohol use No  . Drug use: No  . Sexual activity: Not Asked   Other Topics Concern  . 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 amoxicillin (AMOXIL) 500 MG capsule TK ONE C PO BID  . atorvastatin (LIPITOR) 20 MG tablet TAKE 1 TABLET BY MOUTH  DAILY  . benzonatate (TESSALON) 100 MG capsule TK 1 OR 2 CS PO TID PRN COU  . ELIQUIS 5 MG TABS tablet TAKE 1 TABLET(5 MG) BY MOUTH TWICE DAILY  . flecainide (TAMBOCOR) 50 MG tablet Take 1 tablet (50 mg total) by mouth 2 (two) times daily.  . hydrochlorothiazide (HYDRODIURIL) 25 MG tablet TAKE ONE (1) TABLET BY MOUTH EVERY DAY  . levothyroxine (SYNTHROID, LEVOTHROID) 50 MCG tablet TAKE 1 TABLET BY MOUTH  DAILY  . metoprolol tartrate (LOPRESSOR) 25 MG tablet Take 25 mg by mouth 3 (three) times daily.   . potassium chloride (K-DUR,KLOR-CON) 10 MEQ tablet TAKE 1 TABLET BY MOUTH  DAILY  . sertraline (ZOLOFT) 50 MG tablet Take 1  tablet (50 mg total) by mouth daily.  . VENTOLIN HFA 108 (90 Base) MCG/ACT inhaler INL 2 PFS PO Q 4 OR 6 H PRF WHEEZE OR SOB  . VIRTUSSIN A/C 100-10 MG/5ML syrup TK 5 ML PO QHS PRN  . [DISCONTINUED] predniSONE (DELTASONE) 20 MG tablet TK 2 TS PO QD  . cefdinir (OMNICEF) 300 MG capsule Take 1 capsule (300 mg total) by mouth 2 (two) times daily.  . predniSONE (DELTASONE) 10 MG tablet Take 4 tablets x 1 day and then decrease by 1/2 tablet per day until down to zero mg.   No facility-administered encounter medications on file as of 07/04/2016.     Review of Systems  Constitutional: Negative for fever and unexpected weight change.       Some decreased appetite.    HENT: Positive for congestion and postnasal drip. Negative for sinus pressure.   Respiratory: Positive for cough and wheezing. Negative for chest tightness and shortness of breath.   Cardiovascular: Negative for chest pain, palpitations and leg  swelling.  Gastrointestinal: Negative for diarrhea, nausea and vomiting.  Skin: Negative for color change and rash.  Neurological: Negative for dizziness, light-headedness and headaches.  Psychiatric/Behavioral: Negative for agitation and dysphoric mood.       Objective:    Physical Exam  Constitutional: She appears well-developed and well-nourished. No distress.  HENT:  Mouth/Throat: Oropharynx is clear and moist.  Nares - slightly erythematous turbinates.    Neck: Neck supple. No thyromegaly present.  Cardiovascular: Normal rate and regular rhythm.   Pulmonary/Chest: Breath sounds normal. No respiratory distress.  Increased congestion and some wheezing.  Increased cough with forced expiration.    Musculoskeletal: She exhibits no edema or tenderness.  Lymphadenopathy:    She has no cervical adenopathy.  Skin: No rash noted. No erythema.  Psychiatric: She has a normal mood and affect. Her behavior is normal.    BP (!) 154/96 (BP Location: Left Arm, Patient Position: Sitting, Cuff Size: Normal)   Pulse (!) 48   Temp 98.5 F (36.9 C) (Oral)   Resp 12   Wt 209 lb 2 oz (94.9 kg)   SpO2 96%   BMI 37.04 kg/m  Wt Readings from Last 3 Encounters:  07/04/16 209 lb 2 oz (94.9 kg)  04/28/16 214 lb 6 oz (97.2 kg)  04/09/16 211 lb 3.2 oz (95.8 kg)     Lab Results  Component Value Date   WBC 9.2 06/11/2015   HGB 14.9 06/11/2015   HCT 43.7 06/11/2015   PLT 207.0 06/11/2015   GLUCOSE 97 04/04/2016   CHOL 166 04/04/2016   TRIG 176.0 (H) 04/04/2016   HDL 41.70 04/04/2016   LDLDIRECT 97.0 12/14/2015   LDLCALC 89 04/04/2016   ALT 11 04/04/2016   AST 11 04/04/2016   NA 141 04/04/2016   K 3.9 04/04/2016   CL 105 04/04/2016   CREATININE 0.73 04/04/2016   BUN 15 04/04/2016   CO2 30 04/04/2016   TSH 1.98 06/11/2015   HGBA1C 5.8 04/04/2016   MICROALBUR <0.7 06/11/2015       Assessment & Plan:   Problem List Items Addressed This Visit    Anxiety    Stable on sertraline.          Atrial fibrillation ([place])    On flecainide.  Have to be careful with drug interactions.  Rated controlled.  Currently doing well.  Followed by cardiology.        Bronchitis    On amoxicillin.  Completing prednisone.  Persistent cough and congestion.  Change abx to omnicef.  Check cxr.  Extend out prednisone.  Start 40mg  and decrease by 5mg  per day until off.  Continue inhalers.  Robitussin, saline nasal spray and steroid nasal spray as directed.  Follow closely.  Get her back in soon to reassess.        Hypertension    Feel her blood pressure is up secondary to the current infection and increased cough.  Hold on making changes in her medications.  Treat infection.  Follow pressures.         Other Visit Diagnoses    Cough    -  Primary   Relevant Orders   DG Chest 2 View (Completed)       Einar Pheasant, MD

## 2016-07-05 ENCOUNTER — Encounter: Payer: Self-pay | Admitting: Internal Medicine

## 2016-07-06 ENCOUNTER — Encounter: Payer: Self-pay | Admitting: Internal Medicine

## 2016-07-06 NOTE — Assessment & Plan Note (Signed)
Feel her blood pressure is up secondary to the current infection and increased cough.  Hold on making changes in her medications.  Treat infection.  Follow pressures.

## 2016-07-06 NOTE — Assessment & Plan Note (Signed)
On amoxicillin.  Completing prednisone.  Persistent cough and congestion.  Change abx to omnicef.  Check cxr.  Extend out prednisone.  Start 40mg  and decrease by 5mg  per day until off.  Continue inhalers.  Robitussin, saline nasal spray and steroid nasal spray as directed.  Follow closely.  Get her back in soon to reassess.

## 2016-07-06 NOTE — Assessment & Plan Note (Signed)
On flecainide.  Have to be careful with drug interactions.  Rated controlled.  Currently doing well.  Followed by cardiology.

## 2016-07-06 NOTE — Assessment & Plan Note (Signed)
Stable on sertraline.

## 2016-07-09 ENCOUNTER — Observation Stay
Admission: EM | Admit: 2016-07-09 | Discharge: 2016-07-10 | Disposition: A | Discharge status: Other Acute Inpatient Hospital | Payer: 59 | Attending: Internal Medicine | Admitting: Internal Medicine

## 2016-07-09 ENCOUNTER — Emergency Department: Payer: 59

## 2016-07-09 DIAGNOSIS — M50323 Other cervical disc degeneration at C6-C7 level: Secondary | ICD-10-CM | POA: Insufficient documentation

## 2016-07-09 DIAGNOSIS — J320 Chronic maxillary sinusitis: Secondary | ICD-10-CM | POA: Diagnosis not present

## 2016-07-09 DIAGNOSIS — I951 Orthostatic hypotension: Secondary | ICD-10-CM | POA: Diagnosis not present

## 2016-07-09 DIAGNOSIS — F419 Anxiety disorder, unspecified: Secondary | ICD-10-CM | POA: Insufficient documentation

## 2016-07-09 DIAGNOSIS — Z79899 Other long term (current) drug therapy: Secondary | ICD-10-CM | POA: Diagnosis not present

## 2016-07-09 DIAGNOSIS — J069 Acute upper respiratory infection, unspecified: Secondary | ICD-10-CM | POA: Diagnosis not present

## 2016-07-09 DIAGNOSIS — E559 Vitamin D deficiency, unspecified: Secondary | ICD-10-CM | POA: Insufficient documentation

## 2016-07-09 DIAGNOSIS — E669 Obesity, unspecified: Secondary | ICD-10-CM | POA: Insufficient documentation

## 2016-07-09 DIAGNOSIS — I119 Hypertensive heart disease without heart failure: Secondary | ICD-10-CM | POA: Insufficient documentation

## 2016-07-09 DIAGNOSIS — R531 Weakness: Secondary | ICD-10-CM

## 2016-07-09 DIAGNOSIS — Z7901 Long term (current) use of anticoagulants: Secondary | ICD-10-CM | POA: Insufficient documentation

## 2016-07-09 DIAGNOSIS — Z882 Allergy status to sulfonamides status: Secondary | ICD-10-CM | POA: Diagnosis not present

## 2016-07-09 DIAGNOSIS — Z888 Allergy status to other drugs, medicaments and biological substances status: Secondary | ICD-10-CM | POA: Diagnosis not present

## 2016-07-09 DIAGNOSIS — Z90710 Acquired absence of both cervix and uterus: Secondary | ICD-10-CM | POA: Insufficient documentation

## 2016-07-09 DIAGNOSIS — Z6837 Body mass index (BMI) 37.0-37.9, adult: Secondary | ICD-10-CM | POA: Insufficient documentation

## 2016-07-09 DIAGNOSIS — R278 Other lack of coordination: Secondary | ICD-10-CM

## 2016-07-09 DIAGNOSIS — R202 Paresthesia of skin: Secondary | ICD-10-CM

## 2016-07-09 DIAGNOSIS — I482 Chronic atrial fibrillation: Secondary | ICD-10-CM | POA: Diagnosis not present

## 2016-07-09 DIAGNOSIS — F329 Major depressive disorder, single episode, unspecified: Secondary | ICD-10-CM | POA: Insufficient documentation

## 2016-07-09 DIAGNOSIS — R0683 Snoring: Secondary | ICD-10-CM | POA: Insufficient documentation

## 2016-07-09 DIAGNOSIS — E039 Hypothyroidism, unspecified: Secondary | ICD-10-CM | POA: Diagnosis not present

## 2016-07-09 DIAGNOSIS — I480 Paroxysmal atrial fibrillation: Secondary | ICD-10-CM | POA: Diagnosis not present

## 2016-07-09 DIAGNOSIS — E785 Hyperlipidemia, unspecified: Secondary | ICD-10-CM | POA: Diagnosis not present

## 2016-07-09 DIAGNOSIS — G459 Transient cerebral ischemic attack, unspecified: Secondary | ICD-10-CM

## 2016-07-09 LAB — COMPREHENSIVE METABOLIC PANEL
ALBUMIN: 3.2 g/dL — AB (ref 3.5–5.0)
ALK PHOS: 65 U/L (ref 38–126)
ALT: 16 U/L (ref 14–54)
ANION GAP: 8 (ref 5–15)
AST: 22 U/L (ref 15–41)
BUN: 17 mg/dL (ref 6–20)
CALCIUM: 8.8 mg/dL — AB (ref 8.9–10.3)
CHLORIDE: 105 mmol/L (ref 101–111)
CO2: 26 mmol/L (ref 22–32)
Creatinine, Ser: 0.66 mg/dL (ref 0.44–1.00)
GFR calc non Af Amer: >60 mL/min (ref >60)
GLUCOSE: 133 mg/dL — AB (ref 65–99)
Potassium: 3.8 mmol/L (ref 3.5–5.1)
SODIUM: 139 mmol/L (ref 135–145)
Total Bilirubin: 0.8 mg/dL (ref 0.3–1.2)
Total Protein: 6.3 g/dL — ABNORMAL LOW (ref 6.5–8.1)

## 2016-07-09 LAB — URINALYSIS, COMPLETE (UACMP) WITH MICROSCOPIC
BACTERIA UA: NONE SEEN
BILIRUBIN URINE: NEGATIVE
Glucose, UA: NEGATIVE mg/dL
HGB URINE DIPSTICK: NEGATIVE
Ketones, ur: 5 mg/dL — AB
Leukocytes, UA: NEGATIVE
Nitrite: NEGATIVE
PROTEIN: NEGATIVE mg/dL
Specific Gravity, Urine: 1.021 (ref 1.005–1.030)
pH: 6 (ref 5.0–8.0)

## 2016-07-09 LAB — CBC WITH DIFFERENTIAL/PLATELET
Basophils Absolute: 0.1 10*3/uL (ref 0–0.1)
Basophils Relative: 0 %
Eosinophils Absolute: 0 10*3/uL (ref 0–0.7)
Eosinophils Relative: 0 %
HEMATOCRIT: 42.6 % (ref 35.0–47.0)
HEMOGLOBIN: 14.6 g/dL (ref 12.0–16.0)
LYMPHS ABS: 2.1 10*3/uL (ref 1.0–3.6)
LYMPHS PCT: 15 %
MCH: 31.6 pg (ref 26.0–34.0)
MCHC: 34.3 g/dL (ref 32.0–36.0)
MCV: 92 fL (ref 80.0–100.0)
MONO ABS: 0.6 10*3/uL (ref 0.2–0.9)
MONOS PCT: 4 %
NEUTROS ABS: 11.3 10*3/uL — AB (ref 1.4–6.5)
NEUTROS PCT: 81 %
Platelets: 284 10*3/uL (ref 150–440)
RBC: 4.62 MIL/uL (ref 3.80–5.20)
RDW: 13.7 % (ref 11.5–14.5)
WBC: 14.2 10*3/uL — ABNORMAL HIGH (ref 3.6–11.0)

## 2016-07-09 LAB — TROPONIN I: Troponin I: <0.03 ng/mL (ref <0.03)

## 2016-07-09 MED ORDER — SALINE SPRAY 0.65 % NA SOLN
1.0000 | NASAL | Status: DC | PRN
  Filled 2016-07-09: qty 44

## 2016-07-09 MED ORDER — LEVOTHYROXINE SODIUM 50 MCG PO TABS
50.0000 ug | ORAL_TABLET | Freq: Every day | ORAL | Status: DC
  Administered 2016-07-10: 50 ug via ORAL
  Filled 2016-07-09: qty 1

## 2016-07-09 MED ORDER — ALBUTEROL SULFATE (2.5 MG/3ML) 0.083% IN NEBU: 2.5000 mg | INHALATION_SOLUTION | RESPIRATORY_TRACT | Status: DC | PRN

## 2016-07-09 MED ORDER — POTASSIUM CHLORIDE CRYS ER 10 MEQ PO TBCR
10.0000 meq | EXTENDED_RELEASE_TABLET | Freq: Every day | ORAL | Status: DC
  Administered 2016-07-09 – 2016-07-10 (×2): 10 meq via ORAL
  Filled 2016-07-09 (×2): qty 1

## 2016-07-09 MED ORDER — ONDANSETRON HCL 4 MG/2ML IJ SOLN: 4.0000 mg | Freq: Four times a day (QID) | INTRAMUSCULAR | Status: DC | PRN

## 2016-07-09 MED ORDER — ACETAMINOPHEN 325 MG PO TABS: 650.0000 mg | ORAL_TABLET | Freq: Four times a day (QID) | ORAL | Status: DC | PRN

## 2016-07-09 MED ORDER — SERTRALINE HCL 50 MG PO TABS: 50.0000 mg | ORAL_TABLET | Freq: Every day | ORAL | Status: DC

## 2016-07-09 MED ORDER — FLECAINIDE ACETATE 50 MG PO TABS
50.0000 mg | ORAL_TABLET | Freq: Two times a day (BID) | ORAL | Status: DC
  Administered 2016-07-09 – 2016-07-10 (×3): 50 mg via ORAL
  Filled 2016-07-09 (×3): qty 1

## 2016-07-09 MED ORDER — ONDANSETRON HCL 4 MG PO TABS: 4.0000 mg | ORAL_TABLET | Freq: Four times a day (QID) | ORAL | Status: DC | PRN

## 2016-07-09 MED ORDER — HYDROCHLOROTHIAZIDE 25 MG PO TABS
25.0000 mg | ORAL_TABLET | Freq: Every day | ORAL | Status: DC
  Administered 2016-07-09 – 2016-07-10 (×2): 25 mg via ORAL
  Filled 2016-07-09 (×2): qty 1

## 2016-07-09 MED ORDER — APIXABAN 5 MG PO TABS
5.0000 mg | ORAL_TABLET | Freq: Two times a day (BID) | ORAL | Status: DC
  Administered 2016-07-09 – 2016-07-10 (×3): 5 mg via ORAL
  Filled 2016-07-09 (×3): qty 1

## 2016-07-09 MED ORDER — SODIUM CHLORIDE 0.9 % IV BOLUS (SEPSIS)
1000.0000 mL | Freq: Once | INTRAVENOUS | Status: AC
  Administered 2016-07-09: 1000 mL via INTRAVENOUS

## 2016-07-09 MED ORDER — SODIUM CHLORIDE 0.9 % IV SOLN
INTRAVENOUS | Status: AC
  Administered 2016-07-09: 15:00:00 via INTRAVENOUS

## 2016-07-09 MED ORDER — ALBUTEROL SULFATE HFA 108 (90 BASE) MCG/ACT IN AERS: 1.0000 | INHALATION_SPRAY | RESPIRATORY_TRACT | Status: DC | PRN

## 2016-07-09 MED ORDER — FLUTICASONE PROPIONATE 50 MCG/ACT NA SUSP
1.0000 | Freq: Every day | NASAL | Status: DC
  Administered 2016-07-09 – 2016-07-10 (×2): 1 via NASAL
  Filled 2016-07-09: qty 16

## 2016-07-09 MED ORDER — SERTRALINE HCL 50 MG PO TABS
50.0000 mg | ORAL_TABLET | Freq: Every day | ORAL | Status: DC
  Administered 2016-07-09: 50 mg via ORAL
  Filled 2016-07-09: qty 1

## 2016-07-09 MED ORDER — RISAQUAD PO CAPS
1.0000 | ORAL_CAPSULE | Freq: Two times a day (BID) | ORAL | Status: DC
  Filled 2016-07-09 (×2): qty 1

## 2016-07-09 MED ORDER — BENZONATATE 100 MG PO CAPS: 200.0000 mg | ORAL_CAPSULE | Freq: Three times a day (TID) | ORAL | Status: DC | PRN

## 2016-07-09 MED ORDER — METOPROLOL TARTRATE 50 MG PO TABS
50.0000 mg | ORAL_TABLET | Freq: Every day | ORAL | Status: DC
  Administered 2016-07-09 – 2016-07-10 (×2): 50 mg via ORAL
  Filled 2016-07-09 (×2): qty 1

## 2016-07-09 MED ORDER — ACETAMINOPHEN 650 MG RE SUPP: 650.0000 mg | Freq: Four times a day (QID) | RECTAL | Status: DC | PRN

## 2016-07-09 NOTE — Telephone Encounter (Signed)
Do you mind calling pt later and checking on her.

## 2016-07-09 NOTE — ED Notes (Signed)
Pt states after getting up to go to the bathroom, her skin "feels weirder than before". Pt tolerated getting up to go to the bathroom well, no change in over cognitive status of patient, pt remains alert and oriented at this time. MD made aware, no new orders received.

## 2016-07-09 NOTE — ED Notes (Signed)
Pt up to use the rest room, this RN and Kae Heller, Extern, offered to assist, patient, pt requests her daughter assist her to the rest room at this time. This RN and Kae Heller, Extern will return to collect urine sample.

## 2016-07-09 NOTE — Telephone Encounter (Signed)
Thanks

## 2016-07-09 NOTE — H&P (Signed)
[facility] NAME: Bonnie Shaw    MR#:  [id number]  DATE OF BIRTH:  02-Sep-1950  DATE OF ADMISSION:  07/09/2016  PRIMARY CARE PHYSICIAN: Einar Pheasant, MD   REQUESTING/REFERRING PHYSICIAN: McShane  CHIEF COMPLAINT:  Dizziness  HISTORY OF PRESENT ILLNESS:  Bonnie Shaw  is a 66 y.o. female with a known history of Chronic atrial fibrillation, hypertension, hyperlipidemia and multiple other medical problems is presenting to the ED with a chief complaint of generalized weakness and lightheadedness. Patient has had cough for the past few weeks and she was treated with 1 course of amoxicillin and followed by ceftin. Patient also has received prednisone tapering dose which was completed yesterday. Chest x-ray and the CT head are normal here but patient is significantly orthostatic in the emergency department. Patient reports her legs are giving out, denies any complete loss of consciousness. Denies any palpitations. Admits bradycardia and heart rate usually runs at around 50  PAST MEDICAL HISTORY:   Past Medical History:  Diagnosis Date  . Anxiety   . Cataract   . Hyperlipidemia   . Hypertension   . Hyperthyroidism    s/p RAI therapy  . Obesity   . Palpitations   . Panic disorder   . Paroxysmal atrial fibrillation (HCC)   . Snoring     PAST SURGICAL HISTOIRY:   Past Surgical History:  Procedure Laterality Date  . ABDOMINAL HYSTERECTOMY    . CATARACT EXTRACTION    . KNEE SURGERY     left knee  . THYROID ABLATION  2010    SOCIAL HISTORY:   Social History  Substance Use Topics  . Smoking status: Never Smoker  . Smokeless tobacco: Never Used  . Alcohol use No    FAMILY HISTORY:   Family History  Problem Relation Age of Onset  . Alzheimer's disease Father   . Stroke Mother   . Cancer Sister        ovarian   . Ovarian cancer Sister 80    DRUG ALLERGIES:   Allergies  Allergen Reactions  . Meloxicam  Other (See Comments)  . Sulfonamide Derivatives     Unknown reaction    REVIEW OF SYSTEMS:  CONSTITUTIONAL: No fever, reports fatigue or weakness.  EYES: No blurred or double vision.  EARS, NOSE, AND THROAT: No tinnitus or ear pain.  RESPIRATORY: No cough, shortness of breath, wheezing or hemoptysis.  CARDIOVASCULAR: reports dizziness No chest pain, orthopnea, edema.  GASTROINTESTINAL: No nausea, vomiting, diarrhea or abdominal pain.  GENITOURINARY: No dysuria, hematuria.  ENDOCRINE: No polyuria, nocturia,  HEMATOLOGY: No anemia, easy bruising or bleeding SKIN: No rash or lesion. MUSCULOSKELETAL: No joint pain or arthritis.   NEUROLOGIC: No tingling, numbness, weakness.  PSYCHIATRY: No anxiety or depression.   MEDICATIONS AT HOME:   Prior to Admission medications   Medication Sig Start Date End Date Taking? Authorizing Provider  acidophilus (RISAQUAD) CAPS capsule Take 1 capsule by mouth 2 (two) times daily.   Yes [provider]  atorvastatin (LIPITOR) 20 MG tablet TAKE 1 TABLET BY MOUTH  DAILY 06/09/16  Yes Einar Pheasant, MD  benzonatate (TESSALON) 100 MG capsule TK 1 OR 2 CS PO TID PRN COU 06/29/16  Yes [provider]  cefdinir (OMNICEF) 300 MG capsule Take 1 capsule (300 mg total) by mouth 2 (two) times daily. 07/04/16  Yes Einar Pheasant, MD  ELIQUIS 5 MG TABS tablet TAKE 1 TABLET(5 MG) BY MOUTH TWICE DAILY 12/10/15  Yes Thompson Grayer, MD  flecainide (TAMBOCOR) 50 MG tablet Take 1 tablet (50 mg total) by mouth 2 (two) times daily. 07/25/15  Yes Allred, Jeneen Rinks, MD  fluticasone (FLONASE) 50 MCG/ACT nasal spray Place 1 spray into both nostrils daily.   Yes [provider]  hydrochlorothiazide (HYDRODIURIL) 25 MG tablet TAKE ONE (1) TABLET BY MOUTH EVERY DAY 05/27/16  Yes Einar Pheasant, MD  levothyroxine (SYNTHROID, LEVOTHROID) 50 MCG tablet TAKE 1 TABLET BY MOUTH  DAILY 06/09/16  Yes Einar Pheasant, MD  metoprolol tartrate (LOPRESSOR) 50 MG tablet Take 50 mg  by mouth daily.    Yes [provider]  potassium chloride (K-DUR,KLOR-CON) 10 MEQ tablet TAKE 1 TABLET BY MOUTH  DAILY 06/09/16  Yes Einar Pheasant, MD  predniSONE (DELTASONE) 10 MG tablet Take 4 tablets x 1 day and then decrease by 1/2 tablet per day until down to zero mg. 07/04/16  Yes Einar Pheasant, MD  sertraline (ZOLOFT) 50 MG tablet Take 1 tablet (50 mg total) by mouth daily. 07/13/15  Yes Jackolyn Confer, MD  sodium chloride (OCEAN) 0.65 % SOLN nasal spray Place 1 spray into both nostrils as needed for congestion.   Yes [provider]  VIRTUSSIN A/C 100-10 MG/5ML syrup TK 5 ML PO QHS PRN 06/29/16  Yes [provider]  amoxicillin (AMOXIL) 500 MG capsule TK ONE C PO BID 06/29/16   [provider]  VENTOLIN HFA 108 (90 Base) MCG/ACT inhaler INL 2 PFS PO Q 4 OR 6 H PRF WHEEZE OR SOB 06/29/16   [provider]      VITAL SIGNS:  Blood pressure 103/79, pulse (!) 48, temperature 98 F (36.7 C), temperature source Oral, resp. rate 16, height 5\' 3"  (1.6 m), weight 94.8 kg (209 lb), SpO2 96 %.  PHYSICAL EXAMINATION:  GENERAL:  66 y.o.-year-old patient lying in the bed with no acute distress.  EYES: Pupils equal, round, reactive to light and accommodation. No scleral icterus. Extraocular muscles intact.  HEENT: Head atraumatic, normocephalic. Oropharynx and nasopharynx clear.  NECK:  Supple, no jugular venous distention. No thyroid enlargement, no tenderness.  LUNGS: Normal breath sounds bilaterally, no wheezing, rales,rhonchi or crepitation. No use of accessory muscles of respiration.  CARDIOVASCULAR: S1, S2 normal. No murmurs, rubs, or gallops.  ABDOMEN: Soft, nontender, nondistended. Bowel sounds present. No organomegaly or mass.  EXTREMITIES: No pedal edema, cyanosis, or clubbing.  NEUROLOGIC: Cranial nerves II through XII are intact. Muscle strength 5/5 in all extremities. Sensation intact. Gait not checked.  PSYCHIATRIC: The patient is alert and  oriented x 3.  SKIN: No obvious rash, lesion, or ulcer.   LABORATORY PANEL:   CBC  Recent Labs Lab 07/09/16 0821  WBC 14.2*  HGB 14.6  HCT 42.6  PLT 284   ------------------------------------------------------------------------------------------------------------------  Chemistries   Recent Labs Lab 07/09/16 0857  NA 139  K 3.8  CL 105  CO2 26  GLUCOSE 133*  BUN 17  CREATININE 0.66  CALCIUM 8.8*  AST 22  ALT 16  ALKPHOS 65  BILITOT 0.8   ------------------------------------------------------------------------------------------------------------------  Cardiac Enzymes  Recent Labs Lab 07/09/16 0857  TROPONINI <0.03   ------------------------------------------------------------------------------------------------------------------  RADIOLOGY:  Dg Chest 2 View  Result Date: 07/09/2016 CLINICAL DATA:  Weakness EXAM: CHEST  2 VIEW COMPARISON:  07/04/2016 FINDINGS: Cardiomegaly. Negative aortic and hilar contours accounting for rotation. There is no edema, consolidation, effusion, or pneumothorax. Spondylosis and moderate dextroscoliosis. IMPRESSION: 1. No acute finding. 2. Cardiomegaly. Electronically Signed   By: Monte Fantasia  M.D.   On: 07/09/2016 08:18   Ct Head Wo Contrast  Result Date: 07/09/2016 CLINICAL DATA:  66 year old female with history of generalized weakness. EXAM: CT HEAD WITHOUT CONTRAST TECHNIQUE: Contiguous axial images were obtained from the base of the skull through the vertex without intravenous contrast. COMPARISON:  No priors. FINDINGS: Brain: Patchy areas of mild decreased attenuation are noted throughout the deep and periventricular white matter of the cerebral hemispheres bilaterally, compatible with mild chronic microvascular ischemic disease. No evidence of acute infarction, hemorrhage, hydrocephalus, extra-axial collection or mass lesion/mass effect. Vascular: No hyperdense vessel or unexpected calcification. Skull: Normal. Negative for  fracture or focal lesion. Sinuses/Orbits: Large air-fluid level in the right maxillary sinus concerning for an acute sinusitis. Other: None. IMPRESSION: 1. No acute intracranial abnormalities. 2. Mild chronic microvascular ischemic changes in the cerebral white matter, as above. 3. Right maxillary sinusitis. Electronically Signed   By: Vinnie Langton M.D.   On: 07/09/2016 10:52    EKG:   Orders placed or performed during the hospital encounter of 07/09/16  . EKG 12-Lead  . EKG 12-Lead    IMPRESSION AND PLAN:   Bonnie Shaw  is a 66 y.o. female with a known history of Chronic atrial fibrillation, hypertension, hyperlipidemia and multiple other medical problems is presenting to the ED with a chief complaint of generalized weakness and lightheadedness. Patient has had cough for the past few weeks and she was treated with 1 course of amoxicillin and followed by ceftin. Patient also has received prednisone tapering dose which was completed yesterday  #Orthostatic hypotension Admit to MedSurg unit, monitor on telemetry Gentle hydration with IV fluids Repeat orthostatics in a.m.  #Sinus bradycardia Monitor patient on telemetry patient reports is chronically bradycardic will continue close monitoring  Will hold off on rate limiting drugs if heart rate is less than 50  #Chronic history of atrial fibrillation Rate controlled at this time Continue metoprolol and eliquis,  # Generalized weakness   provide IV fluids   PT evaluation       All the records are reviewed and case discussed with ED provider. Management plans discussed with the patient, family and they are in agreement.  CODE STATUS: fc , daughter Apolonio Schneiders is the healthcare power of attorney  TOTAL TIME TAKING CARE OF THIS PATIENT: 45 minutes.   Note: This dictation was prepared with Dragon dictation along with smaller phrase technology. Any transcriptional errors that result from this process are unintentional.  Nicholes Mango M.D on 07/09/2016 at 12:13 PM  Between 7am to 6pm - Pager - [phone]  After 6pm go to [email or web address] - password [email or web address] [facility]  Office  [phone]  CC: Primary care physician; Einar Pheasant, MD

## 2016-07-09 NOTE — ED Notes (Signed)
No change in patient condition. Pt's family remains at bedside at this time. Will continue to monitor for further patient needs at this time.

## 2016-07-09 NOTE — ED Notes (Signed)
Pt attempted to ambulate with this NT per MD request. Patient was shaky, she stated "I want the doctor to see how bad it is". This NT was not comfortable bringing her out in the hallway so she was brought back to bed. MD was called to the room to see her walk and did. Patient ambulated to the bathroom with the help of her daughter.

## 2016-07-09 NOTE — ED Notes (Signed)
MD made aware, this RN called to bedside by patient's family, pt requesting this RN to look at bowel movement, pt states, "it looks black to me", pt noted to have had a brown bowel movement at this time, pt also noted to have been ambulatory back to bed with only her daughter's assistance. Pt also states that she is feeling worse, c/o increasing light headed-ness and dizziness, MD made aware, no new orders received.

## 2016-07-09 NOTE — ED Provider Notes (Addendum)
[facility] Emergency Department Provider Note  ____________________________________________   I have reviewed the triage vital signs and the nursing notes.   HISTORY  Chief Complaint Dizziness    HPI Bonnie Shaw is a 66 y.o. female who presents today complaining of feeling lightheaded essentially. Patient has had a cough over the last few weeks. The cough is actually greatly improved however she states she's had 9 new medications during the course of this cough to try to combat this cough or  "bronchitis". She was on amoxicillin for a while now she is on Ceftin , she also has had prednisone is now on a prednisone taper, she also had Flonase she states and multiple cough medications etc.Her chest x-ray was normal. During the course of these antibiotics she started having loose stools. She had diarrhea 2 days ago but now she is having formed stools however the frequency is increased. They're slightly loose. She has had no abdominal pain. She states is where she got up and, has not taken her medication, but felt weak and "weird" she denies any focal weakness. She states her whole body feels a little bit weak. She denies any headache. She denies any nausea or vomiting or diarrhea at this time. She denies any dysuria or urinary frequency. She takes is likely a reaction to the medications. She denies any chest pain, she has no shortness of breath, or cough is improving. No hemoptysis, no bright red blood per rectum no fever no melena no hematemesis. Her feelings of lightheadedness are worse when she stands up quickly and better when she lies down.     Past Medical History:  Diagnosis Date  . Anxiety   . Cataract   . Hyperlipidemia   . Hypertension   . Hyperthyroidism    s/p RAI therapy  . Obesity   . Palpitations   . Panic disorder   . Paroxysmal atrial fibrillation (HCC)   . Snoring     Patient Active Problem List   Diagnosis Date Noted  . Vitamin D  deficiency 12/30/2015  . Healthcare maintenance 09/12/2015  . Atrial fibrillation ([place]) 12/02/2013  . Anxiety 12/02/2013  . Obesity 11/09/2013  . Routine general medical examination at a health care facility 04/28/2013  . Bronchitis 02/24/2013  . Hyperlipidemia 02/23/2012  . Hypothyroidism 02/20/2011  . Hypertension 02/20/2011    Past Surgical History:  Procedure Laterality Date  . ABDOMINAL HYSTERECTOMY    . CATARACT EXTRACTION    . KNEE SURGERY     left knee  . THYROID ABLATION  2010    Prior to Admission medications   Medication Sig Start Date End Date Taking? Authorizing Provider  amoxicillin (AMOXIL) 500 MG capsule TK ONE C PO BID 06/29/16   [provider]  atorvastatin (LIPITOR) 20 MG tablet TAKE 1 TABLET BY MOUTH  DAILY 06/09/16   Einar Pheasant, MD  benzonatate (TESSALON) 100 MG capsule TK 1 OR 2 CS PO TID PRN COU 06/29/16   [provider]  cefdinir (OMNICEF) 300 MG capsule Take 1 capsule (300 mg total) by mouth 2 (two) times daily. 07/04/16   Einar Pheasant, MD  ELIQUIS 5 MG TABS tablet TAKE 1 TABLET(5 MG) BY MOUTH TWICE DAILY 12/10/15   Allred, Jeneen Rinks, MD  flecainide (TAMBOCOR) 50 MG tablet Take 1 tablet (50 mg total) by mouth 2 (two) times daily. 07/25/15   Allred, Jeneen Rinks, MD  hydrochlorothiazide (HYDRODIURIL) 25 MG tablet TAKE ONE (1) TABLET BY MOUTH EVERY DAY 05/27/16   Nicki Reaper,  Charlene, MD  levothyroxine (SYNTHROID, LEVOTHROID) 50 MCG tablet TAKE 1 TABLET BY MOUTH  DAILY 06/09/16   Einar Pheasant, MD  metoprolol tartrate (LOPRESSOR) 25 MG tablet Take 25 mg by mouth 3 (three) times daily.     [provider]  potassium chloride (K-DUR,KLOR-CON) 10 MEQ tablet TAKE 1 TABLET BY MOUTH  DAILY 06/09/16   Einar Pheasant, MD  predniSONE (DELTASONE) 10 MG tablet Take 4 tablets x 1 day and then decrease by 1/2 tablet per day until down to zero mg. 07/04/16   Einar Pheasant, MD  sertraline (ZOLOFT) 50 MG tablet Take 1 tablet (50 mg total) by mouth daily. 07/13/15    Jackolyn Confer, MD  VENTOLIN HFA 108 (90 Base) MCG/ACT inhaler INL 2 PFS PO Q 4 OR 6 H PRF WHEEZE OR SOB 06/29/16   [provider]  VIRTUSSIN A/C 100-10 MG/5ML syrup TK 5 ML PO QHS PRN 06/29/16   [provider]    Allergies Meloxicam and Sulfonamide derivatives  Family History  Problem Relation Age of Onset  . Alzheimer's disease Father   . Stroke Mother   . Cancer Sister        ovarian   . Ovarian cancer Sister 56    Social History Social History  Substance Use Topics  . Smoking status: Never Smoker  . Smokeless tobacco: Never Used  . Alcohol use No    Review of Systems Constitutional: No fever/chills Eyes: No visual changes. ENT: No sore throat. No stiff neck no neck pain Cardiovascular: Denies chest pain. Respiratory: Denies shortness of breath. Gastrointestinal:   no vomiting.  No ongoing diarrhea.  No constipation. Genitourinary: Negative for dysuria. Musculoskeletal: Negative lower extremity swelling Skin: Negative for rash. Neurological: Negative for severe headaches, focal weakness or numbness.   ____________________________________________   PHYSICAL EXAM:  VITAL SIGNS: ED Triage Vitals  Enc Vitals Group     BP 07/09/16 0703 (!) 182/77     Pulse Rate 07/09/16 0703 64     Resp 07/09/16 0703 18     Temp 07/09/16 0703 98 F (36.7 C)     Temp Source 07/09/16 0703 Oral     SpO2 07/09/16 0703 95 %     Weight 07/09/16 0700 209 lb (94.8 kg)     Height 07/09/16 0700 5\' 3"  (1.6 m)     Head Circumference --      Peak Flow --      Pain Score --      Pain Loc --      Pain Edu? --      Excl. in [place]? --     Constitutional: Alert and oriented. Well appearing and in no acute distress., Mildly anxious but very well-appearing Eyes: Conjunctivae are normal Head: Atraumatic HEENT: No congestion/rhinnorhea. Mucous membranes are moist.  Oropharynx non-erythematous Neck:   Nontender with no meningismus, no masses, no stridor Cardiovascular:  Normal rate, slightly irregular rhythm. Grossly normal heart sounds.  Good peripheral circulation. Respiratory: Normal respiratory effort.  No retractions. Lungs CTAB. Abdominal: Soft and nontender. No distention. No guarding no rebound Back:  There is no focal tenderness or step off.  there is no midline tenderness there are no lesions noted. there is no CVA tenderness Musculoskeletal: No lower extremity tenderness, no upper extremity tenderness. No joint effusions, no DVT signs strong distal pulses no edema Neurologic:  Cranial nerves II through XII are grossly intact 5 out of 5 strength bilateral upper and lower extremity. Finger to nose within normal limits  heel to shin within normal limits, speech is normal with no word finding difficulty or dysarthria, reflexes symmetric, pupils are equally round and reactive to light, there is no pronator drift, sensation is normal, vision is intact to confrontation, gait is deferred, there is no nystagmus, normal neurologic exam Skin:  Skin is warm, dry and intact. No rash noted. Psychiatric: Mood and affect are anxious. Speech and behavior are normal.  ____________________________________________   LABS (all labs ordered are listed, but only abnormal results are displayed)  Labs Reviewed  COMPREHENSIVE METABOLIC PANEL  TROPONIN I  CBC WITH DIFFERENTIAL/PLATELET  URINALYSIS, COMPLETE (UACMP) WITH MICROSCOPIC   ____________________________________________  EKG  I personally interpreted any EKGs ordered by me or triage Sinus bradycardia rate 58 beats per an acute ST elevation or acute ST depression, borderline LAD. LVH. No acute ischemia ____________________________________________  RADIOLOGY  I reviewed any imaging ordered by me or triage that were performed during my shift and, if possible, patient and/or family made aware of any abnormal findings. ____________________________________________   PROCEDURES  Procedure(s) performed:  None  Procedures  Critical Care performed: None  ____________________________________________   INITIAL IMPRESSION / ASSESSMENT AND PLAN / ED COURSE  Pertinent labs & imaging results that were available during my care of the patient were reviewed by me and considered in my medical decision making (see chart for details).  Patient here with some postural lightheadedness. There is no evidence of acute CVA, there is no significant reasonably the patient is having lightheadedness from bleeding anywhere. Most likely this is some degree of dehydration and possibly medical reactions to multiple different new medications. We'll give her IV fluids. She is very well-appearing. I did do a chest x-ray which is reassuring. No evidence of pneumonia. Multiple rounds of antibiotics for a cough with some infiltrate probably is area on the side of caution I would think and we will advise her to stop it. Apparently she has 1 more day of the cefdinir. She is also done with her steroid taper today fortunately. Most likely this is a viral pathology. We will give her IV hydration, we will watch her closely in the emergency room. She is certainly not showing any evidence of ACS or PE or dissection. In short, very reassuring exam with lightheadedness after a cough with multiple medications in a 66 year old woman, my hope is that she feels better after IV fluid. Very reassuring exam.   ----------------------------------------- 9:49 AM on 07/09/2016 -----------------------------------------  Workup is very reassuring, patient is on Lopressor and her heart rate is somewhat low however this could certainly be because of beta blockade. Ranges from the mid 40s to the high 50s. No acute ischemia noted. Looking back at multiple prior visits primary care doctor, her heart rate is always in the 40s and 50s. This is going back 3 months. Every visit.  ----------------------------------------- 10:01 AM on  07/09/2016 -----------------------------------------  Patient has no focal complaints she states she just feels a little bit weak and woozy still. This is very nonspecific. She states her skin feels weird. She holds of both hands to me and says "my skin feels weird" not really sure what to make of that medically. We will try to ambulate her and see how she does. She did wonder if perhaps there is some sort of a tick bite disease causing her to feel funny, although she has not had a tick bite. He also has not had a rash. She does not have a rash at this time. I don't see  a tick on her this moment. I'm not certain that there is any reason to suspect the patient has tick paralysis given normal neurologic exam, no evidence of numbness weakness or paralysis. I think more likely she feels weird from having been on steroids and antibiotics for a week for what was likely a viral cough, with loose stools. I offered her CT of the head that she would prefer to decline at this moment, we'll see how she looks when she ambulates. Don't think she likely has C. difficile she is not having copious diarrhea and her stools are actually formed at this time. lab will not run C. difficile testing on formed bowel movements.  ----------------------------------------- 10:08 AM on 07/09/2016 -----------------------------------------  We attempted to walk the patient, her legs seemed to buckle underneath her and she states she felt very uncomfortable walking because she felt generally weak. We will obtain CT scan of her head although this is a diffuse symptomology, Sharlyn Bologna is certainly not impossible, we will admit her for further evaluation she does state that her sensation diffusely is off and that when she touches things they feel "spongy". She has no neck pain, no history of neck injury,   ____________________________________________   FINAL CLINICAL IMPRESSION(S) / ED DIAGNOSES  Final diagnoses:  None       This chart was dictated using voice recognition software.  Despite best efforts to proofread,  errors can occur which can change meaning.      Schuyler Amor, MD 07/09/16 8469    Schuyler Amor, MD 07/09/16 6295    Schuyler Amor, MD 07/09/16 2841    Schuyler Amor, MD 07/09/16 1004    Schuyler Amor, MD 07/09/16 1009

## 2016-07-09 NOTE — ED Notes (Signed)
Pt taken to CT at this time.

## 2016-07-09 NOTE — ED Notes (Signed)
NAD noted at this time. Pt resting in bed with family at bedside at this time. Pt remains in SB with HR 42-44 on the monitor. Pt denies any needs at this time. Dr. Margaretmary Eddy saw patient and patient will be admitted. Will continue to monitor for further patient needs.

## 2016-07-09 NOTE — ED Notes (Signed)
Pt resting in bed with eyes closed, respirations even and unlabored, skin warm, dry, and intact. VSS and WNL. Pt's husband at bedside at this time. Will continue to monitor for further patient needs at this time. Awaiting bed ready for patient admission.

## 2016-07-09 NOTE — Progress Notes (Signed)
Patient was very concerned when she arrived to her room from the ER.  She was most concerned about the abnormal sensation in her arms/legs.  Dr Margaretmary Eddy notified and order received for neurology consult.  Patient is more relaxed since her admission

## 2016-07-09 NOTE — ED Triage Notes (Addendum)
Pt to triage via w/c with no distress noted; Pt reports since yesterday having dizziness, leg weakness, "knees buckling"; pt A&Ox3, speech clear and MAEW, and "everything feels spongy"; denies any c/o pain; currently taking antibiotic for brochitis (cefdinir) and has been having diarrhea

## 2016-07-10 ENCOUNTER — Observation Stay: Payer: 59

## 2016-07-10 ENCOUNTER — Ambulatory Visit (HOSPITAL_COMMUNITY)
Admission: AD | Admit: 2016-07-10 | Discharge: 2016-07-10 | Disposition: A | Discharge status: Home / Self Care | Payer: 59 | Source: Other Acute Inpatient Hospital | Attending: Internal Medicine | Admitting: Internal Medicine

## 2016-07-10 DIAGNOSIS — I951 Orthostatic hypotension: Secondary | ICD-10-CM | POA: Diagnosis present

## 2016-07-10 DIAGNOSIS — R202 Paresthesia of skin: Secondary | ICD-10-CM | POA: Diagnosis not present

## 2016-07-10 DIAGNOSIS — R531 Weakness: Secondary | ICD-10-CM | POA: Diagnosis present

## 2016-07-10 DIAGNOSIS — R278 Other lack of coordination: Secondary | ICD-10-CM

## 2016-07-10 LAB — FOLATE: Folate: 14.9 ng/mL (ref >5.9)

## 2016-07-10 LAB — TSH: TSH: 1.124 u[IU]/mL (ref 0.350–4.500)

## 2016-07-10 LAB — VITAMIN B12: Vitamin B-12: 262 pg/mL (ref 180–914)

## 2016-07-10 MED ORDER — GADOBENATE DIMEGLUMINE 529 MG/ML IV SOLN
20.0000 mL | Freq: Once | INTRAVENOUS | Status: AC | PRN
  Administered 2016-07-10: 20 mL via INTRAVENOUS

## 2016-07-10 MED ORDER — LORAZEPAM 2 MG/ML IJ SOLN
2.0000 mg | Freq: Once | INTRAMUSCULAR | Status: AC
  Administered 2016-07-10: 2 mg via INTRAVENOUS
  Filled 2016-07-10: qty 1

## 2016-07-10 NOTE — Progress Notes (Signed)
Okay per Dr. Viann Shove  to place order for 1 x dose of 2mg  IV ativan as pt took this AM and then they did not come to take her down for MRI.

## 2016-07-10 NOTE — Evaluation (Signed)
Physical Therapy Evaluation Patient Details Name: Bonnie Shaw MRN: [id number] DOB: 1950-09-11 Today's Date: 07/10/2016   History of Present Illness  66 y/o female who reports ~2 weeks of feeling weak, uncoordinated and generally not feeling herself.  She had started a course of prednisone as well as multiple other prescriptions. History of Chronic atrial fibrillation, hypertension, hyperlipidemia, panic disorder, on PT eval she is under observation status for orthostatic hypotension.   Clinical Impression  Pt was anxious t/o the session and though she showed good effort was hesitant to try a lot.  She was very unsteady with attempt at standing (should use walker next time, though she did not seem willing/interested to try).  Pt having difficult time fully explaining symptoms, but generally feels numb in arms and legs, reports feeling like she can't control motor movements (though she showed only minor deficits with basic UE testing).  She was generally weak, but did seem to be equal bilaterally or have any focal deficits.  Pt extremely unsafe with standing activities and getting away from EOB would have been difficult and likely very unsafe today.  Pt clearly upset about situation, does not want to do anything but go home on d/c, but does realize that she is not safe to do so given her functional status today.     Follow Up Recommendations CIR    Equipment Recommendations  Rolling walker with 5" wheels    Recommendations for Other Services       Precautions / Restrictions Precautions Precautions: Fall Restrictions Weight Bearing Restrictions: No      Mobility  Bed Mobility Overal bed mobility: Modified Independent             General bed mobility comments: using rails pt was able to get herself up to sitting EOB  Transfers Overall transfer level: Needs assistance Equipment used: 1 person hand held assist Transfers: Sit to/from Stand Sit to Stand: Min assist          General transfer comment: Pt struggled to get to standing and was unable to maintain standing w/o using back of LEs on bed.  With HHA she did does some weight transfer and do 1 side step at EOB but was unsteady and unsafe.  Pt unable to maintain balance, though she did not have a fall she had buckling and inability to maintain static standing and needed to sit down quickly on both standing bouts.  Ambulation/Gait             General Gait Details: unable, unsafe, pt not interested in trying (even with walker) today, likely would not have been safe to leave EOB  Stairs            Wheelchair Mobility    Modified Rankin (Stroke Patients Only)       Balance Overall balance assessment: Needs assistance Sitting-balance support: No upper extremity supported Sitting balance-Leahy Scale: Good     Standing balance support: Bilateral upper extremity supported Standing balance-Leahy Scale: Poor Standing balance comment: Pt extremely unsteady and lacking confidence even with b/l HHA.                             Pertinent Vitals/Pain Pain Assessment:  (pt c/o very little pain, more coordination and weakness)    Home Living Family/patient expects to be discharged to:: Unsure                 Additional Comments: Pt indicates she  will, under no conditions, go to a rehab facility.    Prior Function Level of Independence: Independent               Hand Dominance        Extremity/Trunk Assessment   Upper Extremity Assessment Upper Extremity Assessment: Generalized weakness (pt with poor overall strength, grossly 3/5, general numbness)    Lower Extremity Assessment Lower Extremity Assessment: Generalized weakness (grossly 3/5, decreased quality of motion, general numbness)       Communication   Communication: No difficulties  Cognition Arousal/Alertness: Awake/alert Behavior During Therapy: Restless;Anxious Overall Cognitive Status: Within  Functional Limits for tasks assessed                                        General Comments      Exercises     Assessment/Plan    PT Assessment Patient needs continued PT services  PT Problem List Decreased strength;Decreased range of motion;Decreased activity tolerance;Decreased balance;Decreased mobility;Decreased coordination;Decreased knowledge of use of DME;Decreased safety awareness;Impaired sensation       PT Treatment Interventions DME instruction;Gait training;Stair training;Functional mobility training;Therapeutic exercise;Therapeutic activities;Balance training;Neuromuscular re-education;Patient/family education    PT Goals (Current goals can be found in the Care Plan section)  Acute Rehab PT Goals Patient Stated Goal: figure out what's wrong PT Goal Formulation: With patient Time For Goal Achievement: 07/24/16 Potential to Achieve Goals: Fair    Frequency Min 2X/week   Barriers to discharge        Co-evaluation               AM-PAC PT "6 Clicks" Daily Activity  Outcome Measure Difficulty turning over in bed (including adjusting bedclothes, sheets and blankets)?: A Little Difficulty moving from lying on back to sitting on the side of the bed? : A Lot Difficulty sitting down on and standing up from a chair with arms (e.g., wheelchair, bedside commode, etc,.)?: A Lot Help needed moving to and from a bed to chair (including a wheelchair)?: Total Help needed walking in hospital room?: Total Help needed climbing 3-5 steps with a railing? : Total 6 Click Score: 10    End of Session Equipment Utilized During Treatment: Gait belt Activity Tolerance: Patient limited by fatigue (anxiety) Patient left: with bed alarm set;with nursing/sitter in room;with call bell/phone within reach Nurse Communication: Mobility status PT Visit Diagnosis: Muscle weakness (generalized) (M62.81);Other symptoms and signs involving the nervous system  (R29.898);Difficulty in walking, not elsewhere classified (R26.2)    Time: 5329-9242 PT Time Calculation (min) (ACUTE ONLY): 31 min   Charges:   PT Evaluation $PT Eval Moderate Complexity: 1 Procedure     PT G Codes:   PT G-Codes **NOT FOR INPATIENT CLASS** Functional Assessment Tool Used: AM-PAC 6 Clicks Basic Mobility Functional Limitation: Mobility: Walking and moving around Mobility: Walking and Moving Around Current Status (A8341): At least 60 percent but less than 80 percent impaired, limited or restricted Mobility: Walking and Moving Around Goal Status [phone]): At least 20 percent but less than 40 percent impaired, limited or restricted    Kreg Shropshire, DPT 07/10/2016, 3:07 PM

## 2016-07-10 NOTE — Progress Notes (Signed)
Patients NIF is -40 cm H20 Patients VC is 2.59L and 3.06L.

## 2016-07-10 NOTE — Progress Notes (Signed)
Pt transported via [facility] to [facility]. Report given to [facility] RN. Pt's condition is stable. No signs of distress at transport. Family accompanied patient.

## 2016-07-10 NOTE — Progress Notes (Signed)
Pt at 0655 requested that a doctor come see her immediately. She felt as though she was getting worse and that nothing was being done for her. Doctor was called and told pts wishes.  Hourly rounds were made throughout the night. Pt was not sleeping most of the night and denied needing anything when asked. Pt asked at the beginning of shift when the doctor would be in to see her. Pt was told that doctor would round during the am but that RN was unable to provide an exact time. Pt lost IV access at 0600. Pt was requesting to have IV in left arm. Marcella, RN attempted to started IV once without success. Supervisor was then called with request for IV placement. Supervisor unable to come at this time. Passed on to day shift that patient was still without access but did need it.

## 2016-07-10 NOTE — Progress Notes (Signed)
[facility] [place] NAME: Bonnie Shaw    MR#:  [id number]  DATE OF BIRTH:  1950-03-06  SUBJECTIVE:  CHIEF COMPLAINT:   Chief Complaint  Patient presents with  . Dizziness   - patient very worried about her neurological symptoms. Continue to have tingling and numbness in her fingers and also both feet.Family at bedside. - lost IV access, unable to stand up- feels very weak and very anxious about it as the onset was very sudden. - very unhappy about the care she has received in the last 24hrs   REVIEW OF SYSTEMS:  Review of Systems  Constitutional: Negative for chills, fever, malaise/fatigue and weight loss.  HENT: Negative for ear discharge, ear pain, hearing loss, nosebleeds and tinnitus.   Eyes: Negative for blurred vision, double vision and photophobia.  Respiratory: Negative for cough, hemoptysis, shortness of breath and wheezing.   Cardiovascular: Negative for chest pain, palpitations, orthopnea and leg swelling.  Gastrointestinal: Negative for abdominal pain, constipation, diarrhea, heartburn, melena, nausea and vomiting.  Genitourinary: Negative for dysuria, frequency, hematuria and urgency.  Musculoskeletal: Negative for back pain, myalgias and neck pain.  Skin: Negative for rash.  Neurological: Positive for sensory change. Negative for dizziness, tingling, tremors, speech change, focal weakness and headaches.       Ataxia  Endo/Heme/Allergies: Does not bruise/bleed easily.  Psychiatric/Behavioral: Negative for depression.    DRUG ALLERGIES:   Allergies  Allergen Reactions  . Meloxicam Other (See Comments)  . Sulfonamide Derivatives     Unknown reaction    VITALS:  Blood pressure (!) 143/85, pulse 64, temperature 98.1 F (36.7 C), temperature source Oral, resp. rate 18, height 5\' 3"  (1.6 m), weight 94.8 kg (209 lb), SpO2 96 %.  PHYSICAL EXAMINATION:  Physical Exam  GENERAL:  66 y.o.-year-old patient lying in the  bed with no acute distress.  EYES: Pupils equal, round, reactive to light and accommodation. No scleral icterus. Extraocular muscles intact.  HEENT: Head atraumatic, normocephalic. Oropharynx and nasopharynx clear.  NECK:  Supple, no jugular venous distention. No thyroid enlargement, no tenderness.  LUNGS: Normal breath sounds bilaterally, no wheezing, rales,rhonchi or crepitation. No use of accessory muscles of respiration. Decreased bibasilar breath sounds CARDIOVASCULAR: S1, S2 normal. No murmurs, rubs, or gallops.  ABDOMEN: Soft, nontender, nondistended. Bowel sounds present. No organomegaly or mass.  EXTREMITIES: No pedal edema, cyanosis, or clubbing.  NEUROLOGIC: Cranial nerves II through XII are intact. Muscle strength 5/5 in all extremities during in bed testing. Reflexes were minimal both knees and biceps. Sensation intact. Unable to stand up due to generalized weakness  PSYCHIATRIC: The patient is alert and oriented x 3.  SKIN: No obvious rash, lesion, or ulcer.    LABORATORY PANEL:   CBC  Recent Labs Lab 07/09/16 0821  WBC 14.2*  HGB 14.6  HCT 42.6  PLT 284   ------------------------------------------------------------------------------------------------------------------  Chemistries   Recent Labs Lab 07/09/16 0857  NA 139  K 3.8  CL 105  CO2 26  GLUCOSE 133*  BUN 17  CREATININE 0.66  CALCIUM 8.8*  AST 22  ALT 16  ALKPHOS 65  BILITOT 0.8   ------------------------------------------------------------------------------------------------------------------  Cardiac Enzymes  Recent Labs Lab 07/09/16 0857  TROPONINI <0.03   ------------------------------------------------------------------------------------------------------------------  RADIOLOGY:  Dg Chest 2 View  Result Date: 07/09/2016 CLINICAL DATA:  Weakness EXAM: CHEST  2 VIEW COMPARISON:  07/04/2016 FINDINGS: Cardiomegaly. Negative aortic and hilar contours accounting for rotation. There is no  edema, consolidation, effusion, or  pneumothorax. Spondylosis and moderate dextroscoliosis. IMPRESSION: 1. No acute finding. 2. Cardiomegaly. Electronically Signed   By: Monte Fantasia M.D.   On: 07/09/2016 08:18   Ct Head Wo Contrast  Result Date: 07/09/2016 CLINICAL DATA:  66 year old female with history of generalized weakness. EXAM: CT HEAD WITHOUT CONTRAST TECHNIQUE: Contiguous axial images were obtained from the base of the skull through the vertex without intravenous contrast. COMPARISON:  No priors. FINDINGS: Brain: Patchy areas of mild decreased attenuation are noted throughout the deep and periventricular white matter of the cerebral hemispheres bilaterally, compatible with mild chronic microvascular ischemic disease. No evidence of acute infarction, hemorrhage, hydrocephalus, extra-axial collection or mass lesion/mass effect. Vascular: No hyperdense vessel or unexpected calcification. Skull: Normal. Negative for fracture or focal lesion. Sinuses/Orbits: Large air-fluid level in the right maxillary sinus concerning for an acute sinusitis. Other: None. IMPRESSION: 1. No acute intracranial abnormalities. 2. Mild chronic microvascular ischemic changes in the cerebral white matter, as above. 3. Right maxillary sinusitis. Electronically Signed   By: Vinnie Langton M.D.   On: 07/09/2016 10:52    EKG:   Orders placed or performed during the hospital encounter of 07/09/16  . EKG 12-Lead  . EKG 12-Lead    ASSESSMENT AND PLAN:   66 y/o Female with PMH of chronic afib on eliquis, hypertension, hyperlipidemia, depression and anxiety admitted with generalized weakness of sudden origin  #1 Parasthesias- sensory changes of hands and legs, inability to stand- sudden origin - has been on ABX and prednisone recently for bronchitis - CT head negative on admission, labs normal - check B12 level and MRI brain - family worried about GBS, motor strength is pretty good in all extremities so far- await neuro  input - neurology consult pending - would benefit from PT consult  #2 Chronic Afib- on flecainide, rate controlled - on eliquis for anti coagulation  #3 HTN- on metoprolol and HCTZ  #4 Hypothyroidism- on synthroid, TSH pending  #5 DVT Prophylaxis- eliquis     All the records are reviewed and case discussed with Care Management/Social Workerr. Management plans discussed with the patient, family and they are in agreement.  CODE STATUS: Full Code  TOTAL TIME TAKING CARE OF THIS PATIENT: 39 minutes.   POSSIBLE D/C IN 1-2 DAYS, DEPENDING ON CLINICAL CONDITION.   Gladstone Lighter M.D on 07/10/2016 at 8:11 AM  Between 7am to 6pm - Pager - [phone]  After 6pm go to [email or web address] - password [email or web address]  Office  [phone]  CC: Primary care physician; Einar Pheasant, MD

## 2016-07-10 NOTE — Progress Notes (Signed)
Inpatient Rehabilitation  Per PT request, patient was screened by Gunnar Fusi for appropriateness for an Inpatient Acute Rehab consult.  At this time we will plan to follow along pending a definitive diagnosis.  Please call with questions.  Carmelia Roller., CCC/SLP Admission Coordinator  [place]  Cell [phone]

## 2016-07-10 NOTE — Discharge Summary (Addendum)
[facility] NAME: Bonnie Shaw    MR#:  [id number]  DATE OF BIRTH:  September 02, 1950  DATE OF ADMISSION:  07/09/2016   ADMITTING PHYSICIAN: Nicholes Mango, MD  DATE OF DISCHARGE:  07/10/2016  PRIMARY CARE PHYSICIAN: Einar Pheasant, MD   ADMISSION DIAGNOSIS:   Weakness [R53.1]  DISCHARGE DIAGNOSIS:   Active Problems:   Orthostatic hypotension   SECONDARY DIAGNOSIS:   Past Medical History:  Diagnosis Date  . Anxiety   . Cataract   . Hyperlipidemia   . Hypertension   . Hyperthyroidism    s/p RAI therapy  . Obesity   . Palpitations   . Panic disorder   . Paroxysmal atrial fibrillation (HCC)   . Snoring     HOSPITAL COURSE:   66 y/o Female with PMH of chronic afib on eliquis, hypertension, hyperlipidemia, depression and anxiety admitted with generalized weakness of sudden origin  #1 Parasthesias with generalized weakness, dysmetria- - recent viral illness, cannot r/o GBS - sensory changes of hands and legs, inability to stand- sudden origin - CT head negative on admission, labs normal - pending B12 AND TSH - MRI of brain and cs-pne done. c-spine degenrative disease and facet arthropathy but no fidings to support above symptoms - on eliquis- so LP cannot be done now -No availability of NCS and EMG - discussed with Neurology, hold eliquis in case needs transfer. Discussed with [facility] - so far breathing comfortably, monitor NIF and Vital capacity - would benefit from PT consult  #2 Chronic Afib- on flecainide, rate controlled- bradycardic today - hold metoprolol. Will hold eliquis for now- last dose received this AM - on eliquis for anti coagulation  #3 HTN- soft blood pressures. Metoprolol and HCTZ on hold  #4 Hypothyroidism- on synthroid, TSH pending  #5 DVT Prophylaxis- eliquis- on hold starting this evening.   Transfer to [facility] when bed available  DISCHARGE CONDITIONS:   Guarded  CONSULTS  OBTAINED:   Treatment Team:  Alexis Goodell, MD Catarina Hartshorn, MD  DRUG ALLERGIES:   Allergies  Allergen Reactions  . Meloxicam Other (See Comments)  . Sulfonamide Derivatives     Unknown reaction   DISCHARGE MEDICATIONS:   Allergies as of 07/10/2016      Reactions   Meloxicam Other (See Comments)   Sulfonamide Derivatives    Unknown reaction      Medication List    STOP taking these medications   amoxicillin 500 MG capsule Commonly known as:  AMOXIL   atorvastatin 20 MG tablet Commonly known as:  LIPITOR   cefdinir 300 MG capsule Commonly known as:  OMNICEF   ELIQUIS 5 MG Tabs tablet Generic drug:  apixaban   hydrochlorothiazide 25 MG tablet Commonly known as:  HYDRODIURIL   metoprolol tartrate 50 MG tablet Commonly known as:  LOPRESSOR   predniSONE 10 MG tablet Commonly known as:  DELTASONE   VIRTUSSIN A/C 100-10 MG/5ML syrup Generic drug:  guaiFENesin-codeine     TAKE these medications   acidophilus Caps capsule Take 1 capsule by mouth 2 (two) times daily.   benzonatate 100 MG capsule Commonly known as:  TESSALON TK 1 OR 2 CS PO TID PRN COU   flecainide 50 MG tablet Commonly known as:  TAMBOCOR Take 1 tablet (50 mg total) by mouth 2 (two) times daily.   fluticasone 50 MCG/ACT nasal spray Commonly known as:  FLONASE Place 1 spray into both nostrils daily.   levothyroxine 50 MCG tablet  Commonly known as:  SYNTHROID, LEVOTHROID TAKE 1 TABLET BY MOUTH  DAILY   potassium chloride 10 MEQ tablet Commonly known as:  K-DUR,KLOR-CON TAKE 1 TABLET BY MOUTH  DAILY   sertraline 50 MG tablet Commonly known as:  ZOLOFT Take 1 tablet (50 mg total) by mouth daily.   sodium chloride 0.65 % Soln nasal spray Commonly known as:  OCEAN Place 1 spray into both nostrils as needed for congestion.   VENTOLIN HFA 108 (90 Base) MCG/ACT inhaler Generic drug:  albuterol INL 2 PFS PO Q 4 OR 6 H PRF WHEEZE OR SOB        DISCHARGE INSTRUCTIONS:   1.  Transfer to [facility]  DIET:   Low sodium diet  ACTIVITY:   As tolerated  OXYGEN:   Home Oxygen: No  Oxygen Delivery: room air  DISCHARGE LOCATION:   [facility]  If you experience worsening of your admission symptoms, develop shortness of breath, life threatening emergency, suicidal or homicidal thoughts you must seek medical attention immediately by calling 911 or calling your MD immediately  if symptoms less severe.  You Must read complete instructions/literature along with all the possible adverse reactions/side effects for all the Medicines you take and that have been prescribed to you. Take any new Medicines after you have completely understood and accpet all the possible adverse reactions/side effects.   Please note  You were cared for by a hospitalist during your hospital stay. If you have any questions about your discharge medications or the care you received while you were in the hospital after you are discharged, you can call the unit and asked to speak with the hospitalist on call if the hospitalist that took care of you is not available. Once you are discharged, your primary care physician will handle any further medical issues. Please note that NO REFILLS for any discharge medications will be authorized once you are discharged, as it is imperative that you return to your primary care physician (or establish a relationship with a primary care physician if you do not have one) for your aftercare needs so that they can reassess your need for medications and monitor your lab values.    On the day of Discharge:  VITAL SIGNS:   Blood pressure (!) 123/56, pulse (!) 50, temperature 98.1 F (36.7 C), temperature source Oral, resp. rate 18, height 5\' 3"  (1.6 m), weight 94.8 kg (209 lb), SpO2 93 %.  PHYSICAL EXAMINATION:    GENERAL:  66 y.o.-year-old patient lying in the bed with no acute distress.  EYES: Pupils equal, round, reactive to light and accommodation. No scleral  icterus. Extraocular muscles intact.  HEENT: Head atraumatic, normocephalic. Oropharynx and nasopharynx clear.  NECK:  Supple, no jugular venous distention. No thyroid enlargement, no tenderness.  LUNGS: Normal breath sounds bilaterally, no wheezing, rales,rhonchi or crepitation. No use of accessory muscles of respiration. Decreased bibasilar breath sounds CARDIOVASCULAR: S1, S2 normal. No murmurs, rubs, or gallops.  ABDOMEN: Soft, nontender, nondistended. Bowel sounds present. No organomegaly or mass.  EXTREMITIES: No pedal edema, cyanosis, or clubbing.  NEUROLOGIC: Cranial nerves II through XII are intact. Muscle strength 5/5 in all extremities during in bed testing. Reflexes were minimal 1+ at biceps and areflexia at knees and ankles. Sensation intact. Unable to stand up due to generalized weakness  PSYCHIATRIC: The patient is alert and oriented x 3.  SKIN: No obvious rash, lesion, or ulcer.   DATA REVIEW:   CBC  Recent Labs Lab 07/09/16 0821  WBC  14.2*  HGB 14.6  HCT 42.6  PLT 284    Chemistries   Recent Labs Lab 07/09/16 0857  NA 139  K 3.8  CL 105  CO2 26  GLUCOSE 133*  BUN 17  CREATININE 0.66  CALCIUM 8.8*  AST 22  ALT 16  ALKPHOS 65  BILITOT 0.8     Microbiology Results  No results found for this or any previous visit.  RADIOLOGY:  Mr Jeri Cos ZO Contrast  Result Date: 07/10/2016 CLINICAL DATA:  Sensory ataxia. Generalized weakness. Tingling and numbness in fingers and feet. EXAM: MRI HEAD WITHOUT AND WITH CONTRAST MRI CERVICAL SPINE WITHOUT CONTRAST TECHNIQUE: Multiplanar, multiecho pulse sequences of the brain and surrounding structures, and cervical spine, to include the craniocervical junction and cervicothoracic junction, were obtained without contrast. Postcontrast MR imaging of the brain was also obtained after contrast. CONTRAST:  39mL MULTIHANCE GADOBENATE DIMEGLUMINE 529 MG/ML IV SOLN COMPARISON:  Head CT from yesterday FINDINGS: MRI HEAD FINDINGS  Brain: No acute infarction, hemorrhage, hydrocephalus, extra-axial collection or mass lesion. Minimal periventricular FLAIR hyperintensity, likely early microvascular ischemic change in this patient with hypertension history. Normal brain volume. No chronic blood products. No findings in the brainstem or cerebellum to explain ataxia. Vascular: Normal flow voids and vascular enhancements, including in the vertebrobasilar circulation Skull and upper cervical spine: No evidence of bone lesion. Sinuses/Orbits: Right maxillary and sphenoid sinus fluid levels. Bilateral cataract resection. MRI CERVICAL SPINE FINDINGS Alignment: Straightening without subluxation. Vertebrae: No fracture, evidence of discitis, or bone lesion. Cord: Cord evaluation is limited by patient motion. No signal abnormality is identified or suspected. In particular, no evidence of dorsal column hyperintensity. Normal cord volume and morphology. Posterior Fossa, vertebral arteries, paraspinal tissues: Brain reported separately. No pathologic findings. Disc levels: C2-3: Unremarkable. C3-4: Mild facet spurring bilaterally.  No herniation or impingement C4-5: Minimal facet spurring.  No herniation or impingement C5-6: Degenerative disc narrowing with left more than right uncovertebral spurring. Negative facets. Moderate left foraminal narrowing. Ventral subarachnoid space effacement without cord impingement. C6-7: Moderate degenerative disc narrowing with mild ridging. Negative facets. No impingement C7-T1:Unremarkable. IMPRESSION: Cervical spine: 1. Motion degraded study with no evidence of myelopathy. 2. 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 Moderate foraminal narrowing on the left at C5-6. No cord impingement. Brain MRI: 1. No acute finding or explanation for symptoms. The brainstem and cerebellum appear normal. 2. Sinusitis with right maxillary and sphenoid fluid levels. Electronically Signed   By: Monte Fantasia M.D.   On: 07/10/2016 15:01   Mr  Cervical Spine Wo Contrast  Result Date: 07/10/2016 CLINICAL DATA:  Sensory ataxia. Generalized weakness. Tingling and numbness in fingers and feet. EXAM: MRI HEAD WITHOUT AND WITH CONTRAST MRI CERVICAL SPINE WITHOUT CONTRAST TECHNIQUE: Multiplanar, multiecho pulse sequences of the brain and surrounding structures, and cervical spine, to include the craniocervical junction and cervicothoracic junction, were obtained without contrast. Postcontrast MR imaging of the brain was also obtained after contrast. CONTRAST:  64mL MULTIHANCE GADOBENATE DIMEGLUMINE 529 MG/ML IV SOLN COMPARISON:  Head CT from yesterday FINDINGS: MRI HEAD FINDINGS Brain: No acute infarction, hemorrhage, hydrocephalus, extra-axial collection or mass lesion. Minimal periventricular FLAIR hyperintensity, likely early microvascular ischemic change in this patient with hypertension history. Normal brain volume. No chronic blood products. No findings in the brainstem or cerebellum to explain ataxia. Vascular: Normal flow voids and vascular enhancements, including in the vertebrobasilar circulation Skull and upper cervical spine: No evidence of bone lesion. Sinuses/Orbits: Right maxillary and sphenoid sinus fluid levels. Bilateral cataract  resection. MRI CERVICAL SPINE FINDINGS Alignment: Straightening without subluxation. Vertebrae: No fracture, evidence of discitis, or bone lesion. Cord: Cord evaluation is limited by patient motion. No signal abnormality is identified or suspected. In particular, no evidence of dorsal column hyperintensity. Normal cord volume and morphology. Posterior Fossa, vertebral arteries, paraspinal tissues: Brain reported separately. No pathologic findings. Disc levels: C2-3: Unremarkable. C3-4: Mild facet spurring bilaterally.  No herniation or impingement C4-5: Minimal facet spurring.  No herniation or impingement C5-6: Degenerative disc narrowing with left more than right uncovertebral spurring. Negative facets. Moderate  left foraminal narrowing. Ventral subarachnoid space effacement without cord impingement. C6-7: Moderate degenerative disc narrowing with mild ridging. Negative facets. No impingement C7-T1:Unremarkable. IMPRESSION: Cervical spine: 1. Motion degraded study with no evidence of myelopathy. 2. 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 Moderate foraminal narrowing on the left at C5-6. No cord impingement. Brain MRI: 1. No acute finding or explanation for symptoms. The brainstem and cerebellum appear normal. 2. Sinusitis with right maxillary and sphenoid fluid levels. Electronically Signed   By: Monte Fantasia M.D.   On: 07/10/2016 15:01     Management plans discussed with the patient, family and they are in agreement.  CODE STATUS:     Code Status Orders        Start     Ordered   07/09/16 1439  Full code  Continuous     07/09/16 1438    Code Status History    Date Active Date Inactive Code Status Order ID Comments User Context   This patient has a current code status but no historical code status.      TOTAL TIME TAKING CARE OF THIS PATIENT: 43 minutes.    Gladstone Lighter M.D on 07/10/2016 at 4:30 PM  Between 7am to 6pm - Pager - [phone]  After 6pm go to [email or web address] - Proofreader  [facility]  Office  [phone]  CC: Primary care physician; Einar Pheasant, MD   Note: This dictation was prepared with Dragon dictation along with smaller phrase technology. Any transcriptional errors that result from this process are unintentional.

## 2016-07-10 NOTE — Progress Notes (Signed)
Called report to [facility] at [facility]. Also notified [facility] to come transport patient.

## 2016-07-10 NOTE — Consult Note (Addendum)
Reason for Consult:Gait abnormalities, paresthesias Referring Physician: Tressia Miners  CC: Gait abnormalities, paresthesias  HPI: Bonnie Shaw is an 66 y.o. female who reports that for the past 2 weeks she has been ill with bronchitis.  She has been on multiple antibiotic regimens and steroids with no significant improvement.  She reports awakening on the day prior to admission and feeling her knees giving away.  Also noted some tingling at her fingertips.  Was able to go about her usual activities.  On the next day noted tingling in her feet.  Was more weak and noted that hr walking was off.  It was as if she could not control herself.  This continued until the patient presented for evaluation.  Reports that overnight her numbness worsened and at times ascended to involve her thighs.  When she sits on the toilet she feels that her buttocks and abdomen are tingling.  She has no incontinence of bowel or bladder.  She has no breathing or swallowing difficulties.    Past Medical History:  Diagnosis Date  . Anxiety   . Cataract   . Hyperlipidemia   . Hypertension   . Hyperthyroidism    s/p RAI therapy  . Obesity   . Palpitations   . Panic disorder   . Paroxysmal atrial fibrillation (HCC)   . Snoring     Past Surgical History:  Procedure Laterality Date  . ABDOMINAL HYSTERECTOMY    . CATARACT EXTRACTION    . KNEE SURGERY     left knee  . THYROID ABLATION  2010    Family History  Problem Relation Age of Onset  . Alzheimer's disease Father   . Stroke Mother   . Cancer Sister        ovarian   . Ovarian cancer Sister 16    Social History:  reports that she has never smoked. She has never used smokeless tobacco. She reports that she does not drink alcohol or use drugs.  Allergies  Allergen Reactions  . Meloxicam Other (See Comments)  . Sulfonamide Derivatives     Unknown reaction    Medications:  I have reviewed the patient's current medications. Prior to Admission:   Prescriptions Prior to Admission  Medication Sig Dispense Refill Last Dose  . acidophilus (RISAQUAD) CAPS capsule Take 1 capsule by mouth 2 (two) times daily.   07/08/2016 at 2100  . atorvastatin (LIPITOR) 20 MG tablet TAKE 1 TABLET BY MOUTH  DAILY 90 tablet 1 07/08/2016 at 2100  . benzonatate (TESSALON) 100 MG capsule TK 1 OR 2 CS PO TID PRN COU  0 07/08/2016 at 2100  . cefdinir (OMNICEF) 300 MG capsule Take 1 capsule (300 mg total) by mouth 2 (two) times daily. 20 capsule 0 07/08/2016 at 2100  . ELIQUIS 5 MG TABS tablet TAKE 1 TABLET(5 MG) BY MOUTH TWICE DAILY 180 tablet 3 07/08/2016 at 2100  . flecainide (TAMBOCOR) 50 MG tablet Take 1 tablet (50 mg total) by mouth 2 (two) times daily. 180 tablet 3 07/08/2016 at 2100  . fluticasone (FLONASE) 50 MCG/ACT nasal spray Place 1 spray into both nostrils daily.   07/08/2016 at 2100  . hydrochlorothiazide (HYDRODIURIL) 25 MG tablet TAKE ONE (1) TABLET BY MOUTH EVERY DAY 90 tablet 1 07/08/2016 at 1200  . levothyroxine (SYNTHROID, LEVOTHROID) 50 MCG tablet TAKE 1 TABLET BY MOUTH  DAILY 90 tablet 1 07/08/2016 at 0600  . metoprolol tartrate (LOPRESSOR) 50 MG tablet Take 50 mg by mouth daily.    07/08/2016 at  1200  . potassium chloride (K-DUR,KLOR-CON) 10 MEQ tablet TAKE 1 TABLET BY MOUTH  DAILY 90 tablet 1 07/08/2016 at 1200  . predniSONE (DELTASONE) 10 MG tablet Take 4 tablets x 1 day and then decrease by 1/2 tablet per day until down to zero mg. 17 tablet 0 07/08/2016 at 2100  . sertraline (ZOLOFT) 50 MG tablet Take 1 tablet (50 mg total) by mouth daily. 90 tablet 3 07/08/2016 at 2100  . sodium chloride (OCEAN) 0.65 % SOLN nasal spray Place 1 spray into both nostrils as needed for congestion.   07/08/2016 at 2100  . VIRTUSSIN A/C 100-10 MG/5ML syrup TK 5 ML PO QHS PRN  0 07/08/2016 at 2100  . amoxicillin (AMOXIL) 500 MG capsule TK ONE C PO BID  0 Taking  . VENTOLIN HFA 108 (90 Base) MCG/ACT inhaler INL 2 PFS PO Q 4 OR 6 H PRF WHEEZE OR SOB  0 prn at prn   Scheduled: . acidophilus   1 capsule Oral BID  . apixaban  5 mg Oral BID  . flecainide  50 mg Oral BID  . fluticasone  1 spray Each Nare Daily  . hydrochlorothiazide  25 mg Oral Daily  . levothyroxine  50 mcg Oral QAC breakfast  . potassium chloride  10 mEq Oral Daily  . sertraline  50 mg Oral QHS    ROS: History obtained from the patient  General ROS: negative for - chills, fatigue, fever, night sweats, weight gain or weight loss Psychological ROS: anxiety Ophthalmic ROS: negative for - blurry vision, double vision, eye pain or loss of vision ENT ROS: dizziness Allergy and Immunology ROS: negative for - hives or itchy/watery eyes Hematological and Lymphatic ROS: negative for - bleeding problems, bruising or swollen lymph nodes Endocrine ROS: negative for - galactorrhea, hair pattern changes, polydipsia/polyuria or temperature intolerance Respiratory ROS: cough Cardiovascular ROS: negative for - chest pain, dyspnea on exertion, edema or irregular heartbeat Gastrointestinal ROS: few days of loose stools Genito-Urinary ROS: negative for - dysuria, hematuria, incontinence or urinary frequency/urgency Musculoskeletal ROS: negative for - joint swelling or muscular weakness Neurological ROS: as noted in HPI Dermatological ROS: negative for rash and skin lesion changes  Physical Examination: Blood pressure (!) 123/56, pulse (!) 50, temperature 98.1 F (36.7 C), temperature source Oral, resp. rate 18, height 5\' 3"  (1.6 m), weight 94.8 kg (209 lb), SpO2 93 %.  HEENT-  Normocephalic, no lesions, without obvious abnormality.  Normal external eye and conjunctiva.  Normal TM's bilaterally.  Normal auditory canals and external ears. Normal external nose, mucus membranes and septum.  Normal pharynx. Cardiovascular- S1, S2 normal, pulses palpable throughout   Lungs- chest clear, no wheezing, rales, normal symmetric air entry Abdomen- soft, non-tender; bowel sounds normal; no masses,  no organomegaly Extremities- no  edema Lymph-no adenopathy palpable Musculoskeletal-no joint tenderness, deformity or swelling Skin-warm and dry, no hyperpigmentation, vitiligo, or suspicious lesions  Neurological Examination   Mental Status: Alert, oriented, thought content appropriate.  Speech fluent without evidence of aphasia.  Able to follow 3 step commands without difficulty. Cranial Nerves: II: Discs flat bilaterally; Visual fields grossly normal, pupils equal, round, reactive to light and accommodation III,IV, VI: ptosis not present, extra-ocular motions intact bilaterally V,VII: smile symmetric, facial light touch sensation normal bilaterally VIII: hearing normal bilaterally IX,X: gag reflex present XI: bilateral shoulder shrug XII: midline tongue extension Motor: Right : Upper extremity   5-/5    Left:     Upper extremity   5-/5  Lower  extremity   5/5     Lower extremity   5/5 Tone and bulk:normal tone throughout; no atrophy noted Sensory: Pinprick and light touch intact throughout, bilaterally.  Subjectively patient reports tingling in her feet and up the lateral aspect of her calves and in her hands bilaterally.   Deep Tendon Reflexes: 1+ at the biceps bilaterally and absent otherwise.   Plantars: Right: mute   Left: mute Cerebellar: Normal finger-to-nose.  Dysmetria with heel to shin testing, left greater than right Gait: Patient able t stand but swaying.  When eyes closed more off balance but able to self correct.      Laboratory Studies:   Basic Metabolic Panel:  Recent Labs Lab 07/09/16 0857  NA 139  K 3.8  CL 105  CO2 26  GLUCOSE 133*  BUN 17  CREATININE 0.66  CALCIUM 8.8*    Liver Function Tests:  Recent Labs Lab 07/09/16 0857  AST 22  ALT 16  ALKPHOS 65  BILITOT 0.8  PROT 6.3*  ALBUMIN 3.2*   No results for input(s): LIPASE, AMYLASE in the last 168 hours. No results for input(s): AMMONIA in the last 168 hours.  CBC:  Recent Labs Lab 07/09/16 0821  WBC 14.2*   NEUTROABS 11.3*  HGB 14.6  HCT 42.6  MCV 92.0  PLT 284    Cardiac Enzymes:  Recent Labs Lab 07/09/16 0857  TROPONINI <0.03    BNP: Invalid input(s): POCBNP  CBG: No results for input(s): GLUCAP in the last 168 hours.  Microbiology: No results found for this or any previous visit.  Coagulation Studies: No results for input(s): LABPROT, INR in the last 72 hours.  Urinalysis:  Recent Labs Lab 07/09/16 0821  COLORURINE YELLOW*  LABSPEC 1.021  PHURINE 6.0  GLUCOSEU NEGATIVE  HGBUR NEGATIVE  BILIRUBINUR NEGATIVE  KETONESUR 5*  PROTEINUR NEGATIVE  NITRITE NEGATIVE  LEUKOCYTESUR NEGATIVE    Lipid Panel:     Component Value Date/Time   CHOL 166 04/04/2016 0826   TRIG 176.0 (H) 04/04/2016 0826   HDL 41.70 04/04/2016 0826   CHOLHDL 4 04/04/2016 0826   VLDL 35.2 04/04/2016 0826   LDLCALC 89 04/04/2016 0826    HgbA1C:  Lab Results  Component Value Date   HGBA1C 5.8 04/04/2016    Urine Drug Screen:  No results found for: LABOPIA, COCAINSCRNUR, LABBENZ, AMPHETMU, THCU, LABBARB  Alcohol Level: No results for input(s): ETH in the last 168 hours.  Other results: EKG: sinus bradycardia at 58 bpm.  Imaging: Dg Chest 2 View  Result Date: 07/09/2016 CLINICAL DATA:  Weakness EXAM: CHEST  2 VIEW COMPARISON:  07/04/2016 FINDINGS: Cardiomegaly. Negative aortic and hilar contours accounting for rotation. There is no edema, consolidation, effusion, or pneumothorax. Spondylosis and moderate dextroscoliosis. IMPRESSION: 1. No acute finding. 2. Cardiomegaly. Electronically Signed   By: Monte Fantasia M.D.   On: 07/09/2016 08:18   Ct Head Wo Contrast  Result Date: 07/09/2016 CLINICAL DATA:  66 year old female with history of generalized weakness. EXAM: CT HEAD WITHOUT CONTRAST TECHNIQUE: Contiguous axial images were obtained from the base of the skull through the vertex without intravenous contrast. COMPARISON:  No priors. FINDINGS: Brain: Patchy areas of mild decreased  attenuation are noted throughout the deep and periventricular white matter of the cerebral hemispheres bilaterally, compatible with mild chronic microvascular ischemic disease. No evidence of acute infarction, hemorrhage, hydrocephalus, extra-axial collection or mass lesion/mass effect. Vascular: No hyperdense vessel or unexpected calcification. Skull: Normal. Negative for fracture or focal lesion. Sinuses/Orbits: Large air-fluid  level in the right maxillary sinus concerning for an acute sinusitis. Other: None. IMPRESSION: 1. No acute intracranial abnormalities. 2. Mild chronic microvascular ischemic changes in the cerebral white matter, as above. 3. Right maxillary sinusitis. Electronically Signed   By: Vinnie Langton M.D.   On: 07/09/2016 10:52   Mr Jeri Cos YB Contrast  Result Date: 07/10/2016 CLINICAL DATA:  Sensory ataxia. Generalized weakness. Tingling and numbness in fingers and feet. EXAM: MRI HEAD WITHOUT AND WITH CONTRAST MRI CERVICAL SPINE WITHOUT CONTRAST TECHNIQUE: Multiplanar, multiecho pulse sequences of the brain and surrounding structures, and cervical spine, to include the craniocervical junction and cervicothoracic junction, were obtained without contrast. Postcontrast MR imaging of the brain was also obtained after contrast. CONTRAST:  4mL MULTIHANCE GADOBENATE DIMEGLUMINE 529 MG/ML IV SOLN COMPARISON:  Head CT from yesterday FINDINGS: MRI HEAD FINDINGS Brain: No acute infarction, hemorrhage, hydrocephalus, extra-axial collection or mass lesion. Minimal periventricular FLAIR hyperintensity, likely early microvascular ischemic change in this patient with hypertension history. Normal brain volume. No chronic blood products. No findings in the brainstem or cerebellum to explain ataxia. Vascular: Normal flow voids and vascular enhancements, including in the vertebrobasilar circulation Skull and upper cervical spine: No evidence of bone lesion. Sinuses/Orbits: Right maxillary and sphenoid sinus  fluid levels. Bilateral cataract resection. MRI CERVICAL SPINE FINDINGS Alignment: Straightening without subluxation. Vertebrae: No fracture, evidence of discitis, or bone lesion. Cord: Cord evaluation is limited by patient motion. No signal abnormality is identified or suspected. In particular, no evidence of dorsal column hyperintensity. Normal cord volume and morphology. Posterior Fossa, vertebral arteries, paraspinal tissues: Brain reported separately. No pathologic findings. Disc levels: C2-3: Unremarkable. C3-4: Mild facet spurring bilaterally.  No herniation or impingement C4-5: Minimal facet spurring.  No herniation or impingement C5-6: Degenerative disc narrowing with left more than right uncovertebral spurring. Negative facets. Moderate left foraminal narrowing. Ventral subarachnoid space effacement without cord impingement. C6-7: Moderate degenerative disc narrowing with mild ridging. Negative facets. No impingement C7-T1:Unremarkable. IMPRESSION: Cervical spine: 1. Motion degraded study with no evidence of myelopathy. 2. 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 Moderate foraminal narrowing on the left at C5-6. No cord impingement. Brain MRI: 1. No acute finding or explanation for symptoms. The brainstem and cerebellum appear normal. 2. Sinusitis with right maxillary and sphenoid fluid levels. Electronically Signed   By: Monte Fantasia M.D.   On: 07/10/2016 15:01   Mr Cervical Spine Wo Contrast  Result Date: 07/10/2016 CLINICAL DATA:  Sensory ataxia. Generalized weakness. Tingling and numbness in fingers and feet. EXAM: MRI HEAD WITHOUT AND WITH CONTRAST MRI CERVICAL SPINE WITHOUT CONTRAST TECHNIQUE: Multiplanar, multiecho pulse sequences of the brain and surrounding structures, and cervical spine, to include the craniocervical junction and cervicothoracic junction, were obtained without contrast. Postcontrast MR imaging of the brain was also obtained after contrast. CONTRAST:  80mL MULTIHANCE GADOBENATE  DIMEGLUMINE 529 MG/ML IV SOLN COMPARISON:  Head CT from yesterday FINDINGS: MRI HEAD FINDINGS Brain: No acute infarction, hemorrhage, hydrocephalus, extra-axial collection or mass lesion. Minimal periventricular FLAIR hyperintensity, likely early microvascular ischemic change in this patient with hypertension history. Normal brain volume. No chronic blood products. No findings in the brainstem or cerebellum to explain ataxia. Vascular: Normal flow voids and vascular enhancements, including in the vertebrobasilar circulation Skull and upper cervical spine: No evidence of bone lesion. Sinuses/Orbits: Right maxillary and sphenoid sinus fluid levels. Bilateral cataract resection. MRI CERVICAL SPINE FINDINGS Alignment: Straightening without subluxation. Vertebrae: No fracture, evidence of discitis, or bone lesion. Cord: Cord evaluation is limited by  patient motion. No signal abnormality is identified or suspected. In particular, no evidence of dorsal column hyperintensity. Normal cord volume and morphology. Posterior Fossa, vertebral arteries, paraspinal tissues: Brain reported separately. No pathologic findings. Disc levels: C2-3: Unremarkable. C3-4: Mild facet spurring bilaterally.  No herniation or impingement C4-5: Minimal facet spurring.  No herniation or impingement C5-6: Degenerative disc narrowing with left more than right uncovertebral spurring. Negative facets. Moderate left foraminal narrowing. Ventral subarachnoid space effacement without cord impingement. C6-7: Moderate degenerative disc narrowing with mild ridging. Negative facets. No impingement C7-T1:Unremarkable. IMPRESSION: Cervical spine: 1. Motion degraded study with no evidence of myelopathy. 2. 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 Moderate foraminal narrowing on the left at C5-6. No cord impingement. Brain MRI: 1. No acute finding or explanation for symptoms. The brainstem and cerebellum appear normal. 2. Sinusitis with right maxillary and sphenoid  fluid levels. Electronically Signed   By: Monte Fantasia M.D.   On: 07/10/2016 15:01     Assessment/Plan: 66 year old female with recent URI who presents with complaints of paresthesias and gait abnormality.  Patient orthostatic as well.   Symptoms progressed overnight.  Patient on Eliquis for symptomatic atrial fibrillation.  Head CT reviewed and unremarkable.  MRI of the brain and cervical spine reviewed as well.  MRI of the brain unremarkable.  MR of the cervical spine with some disc disease but no cord involvement that would explain the patient's symptoms.  GBS in the differential.  Patient will require LP or NCV/EMG for diagnostic purposes.  For LP patient will need to be off Eliquis for 48 hours.  This was explained to patient and family.    Recommendations: 1.  NIF/VC q12 hours 2.  To attempt to schedule NCV/EMG which can be done earlier than LP for diagnostic purposes 3.  B1, B12, TSH, heavy metal screen, lyme titer, folate 4.  Hold Eliquis  Case discussed with Dr. Samuella Cota, MD Neurology [phone] 07/10/2016, 3:51 PM

## 2016-07-11 LAB — B. BURGDORFI ANTIBODIES: B burgdorferi Ab IgG+IgM: <0.91 {ISR} (ref 0.00–0.90)

## 2016-07-11 LAB — HEAVY METALS, BLOOD
Arsenic: 6 ug/L (ref 2–23)
Lead: NOT DETECTED ug/dL (ref 0–19)
Mercury: 1 ug/L (ref 0.0–14.9)

## 2016-07-12 LAB — VITAMIN B1: Vitamin B1 (Thiamine): 162.4 nmol/L (ref 66.5–200.0)

## 2016-07-14 ENCOUNTER — Telehealth: Payer: Self-pay | Admitting: *Deleted

## 2016-07-14 ENCOUNTER — Encounter: Payer: 59 | Admitting: Internal Medicine

## 2016-07-14 NOTE — Telephone Encounter (Signed)
Per chart patient is still in [facility], Tried tocallpatient On Friday and to day left message.

## 2016-07-14 NOTE — Telephone Encounter (Signed)
-----   Message from Einar Pheasant, MD sent at 07/11/2016  5:07 AM EDT ----- Regarding: appt This pt was in [facility] and just transferred to [facility].  Do you mind calling her and seeing how she is doing - just for update.  She also has an appt with me Monday pm.  She will probably not be out of hospital.  Can you please confirm with her to cancel appt on Monday.  Please block the spot because I have someone else I will try to work in.  Thanks    Dr Nicki Reaper

## 2016-07-16 NOTE — Telephone Encounter (Signed)
Pt called and stated that she is at [facility] now and will call back to reschedule her appt.  Call pt @ [phone]

## 2016-07-25 ENCOUNTER — Other Ambulatory Visit: Payer: Self-pay | Admitting: Internal Medicine

## 2016-07-25 DIAGNOSIS — I480 Paroxysmal atrial fibrillation: Secondary | ICD-10-CM

## 2016-07-28 ENCOUNTER — Ambulatory Visit (HOSPITAL_COMMUNITY): Payer: 59 | Admitting: Nurse Practitioner

## 2016-07-29 ENCOUNTER — Encounter (HOSPITAL_COMMUNITY): Payer: Self-pay | Admitting: Nurse Practitioner

## 2016-07-29 ENCOUNTER — Ambulatory Visit (HOSPITAL_COMMUNITY)
Admission: RE | Admit: 2016-07-29 | Discharge: 2016-07-29 | Disposition: A | Discharge status: Home / Self Care | Payer: 59 | Source: Ambulatory Visit | Attending: Nurse Practitioner | Admitting: Nurse Practitioner

## 2016-07-29 VITALS — BP 154/82 | HR 56 | Ht 63.0 in | Wt 210.8 lb

## 2016-07-29 DIAGNOSIS — F419 Anxiety disorder, unspecified: Secondary | ICD-10-CM | POA: Insufficient documentation

## 2016-07-29 DIAGNOSIS — Z8041 Family history of malignant neoplasm of ovary: Secondary | ICD-10-CM | POA: Insufficient documentation

## 2016-07-29 DIAGNOSIS — Z820 Family history of epilepsy and other diseases of the nervous system: Secondary | ICD-10-CM | POA: Insufficient documentation

## 2016-07-29 DIAGNOSIS — E785 Hyperlipidemia, unspecified: Secondary | ICD-10-CM | POA: Diagnosis not present

## 2016-07-29 DIAGNOSIS — Z9889 Other specified postprocedural states: Secondary | ICD-10-CM | POA: Insufficient documentation

## 2016-07-29 DIAGNOSIS — E669 Obesity, unspecified: Secondary | ICD-10-CM | POA: Diagnosis not present

## 2016-07-29 DIAGNOSIS — Z79899 Other long term (current) drug therapy: Secondary | ICD-10-CM | POA: Diagnosis not present

## 2016-07-29 DIAGNOSIS — G610 Guillain-Barre syndrome: Secondary | ICD-10-CM | POA: Diagnosis not present

## 2016-07-29 DIAGNOSIS — I480 Paroxysmal atrial fibrillation: Secondary | ICD-10-CM

## 2016-07-29 DIAGNOSIS — I10 Essential (primary) hypertension: Secondary | ICD-10-CM | POA: Diagnosis not present

## 2016-07-29 DIAGNOSIS — Z888 Allergy status to other drugs, medicaments and biological substances status: Secondary | ICD-10-CM | POA: Insufficient documentation

## 2016-07-29 DIAGNOSIS — Z90710 Acquired absence of both cervix and uterus: Secondary | ICD-10-CM | POA: Insufficient documentation

## 2016-07-29 DIAGNOSIS — E0590 Thyrotoxicosis, unspecified without thyrotoxic crisis or storm: Secondary | ICD-10-CM | POA: Diagnosis not present

## 2016-07-29 DIAGNOSIS — Z823 Family history of stroke: Secondary | ICD-10-CM | POA: Insufficient documentation

## 2016-07-29 DIAGNOSIS — Z882 Allergy status to sulfonamides status: Secondary | ICD-10-CM | POA: Diagnosis not present

## 2016-07-29 NOTE — Progress Notes (Signed)
Patient ID: Bonnie Shaw, female   DOB: 1950/12/05, 66 y.o.   MRN: [id number]     Primary Care Physician: Einar Pheasant, MD Referring Physician: Dr. Donnie Mesa is a 66 y.o. female with a h/o PAF that was started on flecainide by Dr. Rayann Heman latter part of June 2017.  She has not had any further afib and doing well tolerating flecainide. HR is usually in 50's at home, tolerating with minimal dizziness. She has had a sleep study and was negative. Echo showed enlargement of left atrium of 50 mm. Despite this, she has done well with minimum breakthrough afib.  She returns to afib clinic 6/26. She reports that she had a persistent URI and then started developing neuro symptoms of ataxia and change in sensation with touch. She was admitted to [facility], could not walk the next am and was later transferred to [facility] center for possible [place] which spinal tap confirmed and pt was treat with immunoglobulin  therapy.She was sent to SNF was recovered quickly and was d/c after 5 days. She did not have any issues with afib while there.  Today, she denies symptoms of palpitations, chest pain, shortness of breath, orthopnea, PND, lower extremity edema, dizziness, presyncope, syncope, or neurologic sequela. The patient is tolerating medications without difficulties and is otherwise without complaint today.   Past Medical History:  Diagnosis Date  . Anxiety   . Cataract   . Hyperlipidemia   . Hypertension   . Hyperthyroidism    s/p RAI therapy  . Obesity   . Palpitations   . Panic disorder   . Paroxysmal atrial fibrillation (HCC)   . Snoring    Past Surgical History:  Procedure Laterality Date  . ABDOMINAL HYSTERECTOMY    . CATARACT EXTRACTION    . KNEE SURGERY     left knee  . THYROID ABLATION  2010    Current Outpatient Prescriptions  Medication Sig Dispense Refill  . flecainide (TAMBOCOR) 50 MG tablet TAKE ONE (1) TABLET BY MOUTH TWO (2) TIMES DAILY 180 tablet 0  .  fluticasone (FLONASE) 50 MCG/ACT nasal spray Place 1 spray into both nostrils daily.    Marland Kitchen levothyroxine (SYNTHROID, LEVOTHROID) 50 MCG tablet TAKE 1 TABLET BY MOUTH  DAILY 90 tablet 1  . potassium chloride (K-DUR,KLOR-CON) 10 MEQ tablet TAKE 1 TABLET BY MOUTH  DAILY 90 tablet 1  . sertraline (ZOLOFT) 50 MG tablet TAKE ONE (1) TABLET BY MOUTH EVERY DAY 90 tablet 0  . sodium chloride (OCEAN) 0.65 % SOLN nasal spray Place 1 spray into both nostrils as needed for congestion.     No current facility-administered medications for this encounter.     Allergies  Allergen Reactions  . Ativan [Lorazepam]   . Meloxicam Other (See Comments)  . Sulfonamide Derivatives     Unknown reaction    Social History   Social History  . Marital status: Married    Spouse name: N/A  . Number of children: 4  . Years of education: N/A   Occupational History  .  Unemployed   Social History Main Topics  . Smoking status: Never Smoker  . Smokeless tobacco: Never Used  . Alcohol use No  . Drug use: No  . Sexual activity: Not on file   Other Topics Concern  . 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 Alzheimer's disease Father   . Stroke Mother   . Cancer Sister        ovarian   . Ovarian cancer Sister 82    ROS- All systems are reviewed and negative except as per the HPI above  Physical Exam: Vitals:   07/29/16 1119  BP: (!) 154/82  Pulse: (!) 56  Weight: 210 lb 12.8 oz (95.6 kg)  Height: 5\' 3"  (1.6 m)    GEN- The patient is well appearing, alert and oriented x 3 today.   Head- normocephalic, atraumatic Eyes-  Sclera clear, conjunctiva pink Ears- hearing intact Oropharynx- clear Neck- supple, no JVP Lymph- no cervical lymphadenopathy Lungs-  Clear to ausculation bilaterally, normal work of breathing Heart- Regular rate and rhythm, no murmurs, rubs or gallops, PMI not laterally displaced GI- soft, NT, ND, + BS Extremities- no clubbing, cyanosis, or edema MS- no significant deformity or atrophy Skin- no rash or lesion Psych- euthymic mood, full affect Neuro- strength and sensation are intact  EKG- Sinus brady at 56 bpm, pr int 158 ms, qrs int 72 ms, qtc 422 ms Epic records reviewed   Assessment and Plan: 1. Paroxymal afib Doing well on flecainide 50 mg bid with no notable burden Continue metoprolol 50 mg qd If she becomes symptomatic with brady, would try to reduce to 37.5 mg qd Continue on eliquis 5 mg bid  2. HTN Stable  3. [name] Per pcp Has f/u with Dr. Einar Pheasant early August    F/u with Dr. Rayann Heman in 6 months   [name]. Kyndahl Jablon, [facility] [address] [place], [place] [address] [phone]

## 2016-09-04 ENCOUNTER — Encounter: Payer: Self-pay | Admitting: Internal Medicine

## 2016-09-04 ENCOUNTER — Ambulatory Visit (INDEPENDENT_AMBULATORY_CARE_PROVIDER_SITE_OTHER): Payer: 59 | Admitting: Internal Medicine

## 2016-09-04 VITALS — BP 144/82 | HR 61 | Temp 98.6°F | Resp 12 | Ht 63.0 in | Wt 211.0 lb

## 2016-09-04 DIAGNOSIS — Z6837 Body mass index (BMI) 37.0-37.9, adult: Secondary | ICD-10-CM

## 2016-09-04 DIAGNOSIS — G610 Guillain-Barre syndrome: Secondary | ICD-10-CM | POA: Diagnosis not present

## 2016-09-04 DIAGNOSIS — E785 Hyperlipidemia, unspecified: Secondary | ICD-10-CM

## 2016-09-04 DIAGNOSIS — I480 Paroxysmal atrial fibrillation: Secondary | ICD-10-CM | POA: Diagnosis not present

## 2016-09-04 DIAGNOSIS — E2839 Other primary ovarian failure: Secondary | ICD-10-CM | POA: Diagnosis not present

## 2016-09-04 DIAGNOSIS — I10 Essential (primary) hypertension: Secondary | ICD-10-CM

## 2016-09-04 DIAGNOSIS — Z1231 Encounter for screening mammogram for malignant neoplasm of breast: Secondary | ICD-10-CM | POA: Diagnosis not present

## 2016-09-04 DIAGNOSIS — R739 Hyperglycemia, unspecified: Secondary | ICD-10-CM

## 2016-09-04 DIAGNOSIS — E039 Hypothyroidism, unspecified: Secondary | ICD-10-CM | POA: Diagnosis not present

## 2016-09-04 DIAGNOSIS — Z1239 Encounter for other screening for malignant neoplasm of breast: Secondary | ICD-10-CM

## 2016-09-04 DIAGNOSIS — Z0000 Encounter for general adult medical examination without abnormal findings: Secondary | ICD-10-CM

## 2016-09-04 LAB — BASIC METABOLIC PANEL
BUN: 13 mg/dL (ref 6–23)
CALCIUM: 9.7 mg/dL (ref 8.4–10.5)
CO2: 29 mEq/L (ref 19–32)
CREATININE: 0.74 mg/dL (ref 0.40–1.20)
Chloride: 104 mEq/L (ref 96–112)
GFR: 83.21 mL/min (ref >60.00)
GLUCOSE: 99 mg/dL (ref 70–99)
Potassium: 3.9 mEq/L (ref 3.5–5.1)
SODIUM: 140 meq/L (ref 135–145)

## 2016-09-04 LAB — CBC WITH DIFFERENTIAL/PLATELET
BASOS ABS: 0.1 10*3/uL (ref 0.0–0.1)
Basophils Relative: 1 % (ref 0.0–3.0)
Eosinophils Absolute: 0.1 10*3/uL (ref 0.0–0.7)
Eosinophils Relative: 2 % (ref 0.0–5.0)
HEMATOCRIT: 43.4 % (ref 36.0–46.0)
HEMOGLOBIN: 14.7 g/dL (ref 12.0–15.0)
LYMPHS PCT: 35.3 % (ref 12.0–46.0)
Lymphs Abs: 2.2 10*3/uL (ref 0.7–4.0)
MCHC: 34 g/dL (ref 30.0–36.0)
MCV: 93.5 fl (ref 78.0–100.0)
MONO ABS: 0.5 10*3/uL (ref 0.1–1.0)
Monocytes Relative: 8.1 % (ref 3.0–12.0)
Neutro Abs: 3.3 10*3/uL (ref 1.4–7.7)
Neutrophils Relative %: 53.6 % (ref 43.0–77.0)
Platelets: 204 10*3/uL (ref 150.0–400.0)
RBC: 4.64 Mil/uL (ref 3.87–5.11)
RDW: 14.2 % (ref 11.5–15.5)
WBC: 6.2 10*3/uL (ref 4.0–10.5)

## 2016-09-04 LAB — LIPID PANEL
CHOLESTEROL: 187 mg/dL (ref 0–200)
HDL: 45.8 mg/dL (ref >39.00)
LDL CALC: 104 mg/dL — AB (ref 0–99)
NonHDL: 141.07
TRIGLYCERIDES: 186 mg/dL — AB (ref 0.0–149.0)
Total CHOL/HDL Ratio: 4
VLDL: 37.2 mg/dL (ref 0.0–40.0)

## 2016-09-04 LAB — HEPATIC FUNCTION PANEL
ALT: 13 U/L (ref 0–35)
AST: 14 U/L (ref 0–37)
Albumin: 4.3 g/dL (ref 3.5–5.2)
Alkaline Phosphatase: 54 U/L (ref 39–117)
Bilirubin, Direct: 0.1 mg/dL (ref 0.0–0.3)
TOTAL PROTEIN: 7.1 g/dL (ref 6.0–8.3)
Total Bilirubin: 1.2 mg/dL (ref 0.2–1.2)

## 2016-09-04 LAB — TSH: TSH: 2.47 u[IU]/mL (ref 0.35–4.50)

## 2016-09-04 LAB — HEMOGLOBIN A1C: HEMOGLOBIN A1C: 5.5 % (ref 4.6–6.5)

## 2016-09-04 MED ORDER — SERTRALINE HCL 50 MG PO TABS: ORAL_TABLET | ORAL | 0 refills | Status: DC

## 2016-09-04 MED ORDER — LEVOTHYROXINE SODIUM 50 MCG PO TABS: 50.0000 ug | ORAL_TABLET | Freq: Every day | ORAL | 1 refills | Status: DC

## 2016-09-04 MED ORDER — POTASSIUM CHLORIDE CRYS ER 10 MEQ PO TBCR: 10.0000 meq | EXTENDED_RELEASE_TABLET | Freq: Every day | ORAL | 1 refills | Status: DC

## 2016-09-04 NOTE — Assessment & Plan Note (Signed)
Physical today 09/04/16.  Mammogram 09/11/15 - Birads I.

## 2016-09-04 NOTE — Progress Notes (Signed)
Pre-visit discussion using our clinic review tool. No additional management support is needed unless otherwise documented below in the visit note.  

## 2016-09-04 NOTE — Progress Notes (Signed)
Patient ID: Bonnie Shaw, female   DOB: [birth year], 66 y.o.   MRN: [id number]   Subjective:    Patient ID: Bonnie Shaw, female    DOB: [birth year], 66 y.o.   MRN: [id number]  HPI  Patient with past history of paroxysmal afib, hypertension and hypercholesterolemia.  She comes in today to follow up on these issues as well as for a complete physical exam.  She was recently admitted and diagnosed with Jossie Ng.  Was treated with IVIG.  She is doing better.  Feels better.  Not significantly limited in her activity.  Does report some fatigue.  Still notices some tingling - finger tips.  Toes are more sensitive.  No chest pain.  Breathing stable.  Sees cardiology.  Last evaluated 07/29/16.  Felt stable.  No acid reflux.  No abdominal pain.  Bowels moving.     Past Medical History:  Diagnosis Date  . Anxiety   . Cataract   . Hyperlipidemia   . Hypertension   . Hyperthyroidism    s/p RAI therapy  . Obesity   . Palpitations   . Panic disorder   . Paroxysmal atrial fibrillation (HCC)   . Snoring    Past Surgical History:  Procedure Laterality Date  . ABDOMINAL HYSTERECTOMY    . CATARACT EXTRACTION    . KNEE SURGERY     left knee  . THYROID ABLATION  2010   Family History  Problem Relation Age of Onset  . Alzheimer's disease Father   . Stroke Mother   . Cancer Sister        ovarian   . Ovarian cancer Sister 70   Social History   Social History  . Marital status: Married    Spouse name: N/A  . Number of children: 4  . Years of education: N/A   Occupational History  .  Unemployed   Social History Main Topics  . Smoking status: Never Smoker  . Smokeless tobacco: Never Used  . Alcohol use No  . Drug use: No  . Sexual activity: Not Asked   Other Topics Concern  . None   Social History Narrative   Lives in [place] with husband, has 4 children and 59 grandkids         Attends [employer], 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    Writes as a Radiation protection practitioner for the [employer]   Retired LPN    Outpatient Encounter Prescriptions as of 09/04/2016  Medication Sig  . atorvastatin (LIPITOR) 10 MG tablet Take 10 mg by mouth daily.  Marland Kitchen ELIQUIS 5 MG TABS tablet 1 tablet 2 (two) times daily.  . flecainide (TAMBOCOR) 50 MG tablet TAKE ONE (1) TABLET BY MOUTH TWO (2) TIMES DAILY  . fluticasone (FLONASE) 50 MCG/ACT nasal spray Place 1 spray into both nostrils daily.  . hydrochlorothiazide (HYDRODIURIL) 25 MG tablet Take 25 mg by mouth daily.  Marland Kitchen levothyroxine (SYNTHROID, LEVOTHROID) 50 MCG tablet Take 1 tablet (50 mcg total) by mouth daily.  . metoprolol succinate (TOPROL-XL) 50 MG 24 hr tablet Take 50 mg by mouth daily. Take with or immediately following a meal.  . potassium chloride (K-DUR,KLOR-CON) 10 MEQ tablet Take 1 tablet (10 mEq total) by mouth daily.  . sertraline (ZOLOFT) 50 MG tablet TAKE ONE (1) TABLET BY MOUTH EVERY DAY  . sodium chloride (OCEAN) 0.65 % SOLN nasal spray Place 1 spray into both nostrils as needed for congestion.  . [DISCONTINUED] levothyroxine (SYNTHROID, LEVOTHROID) 50 MCG tablet TAKE 1  TABLET BY MOUTH  DAILY  . [DISCONTINUED] potassium chloride (K-DUR,KLOR-CON) 10 MEQ tablet TAKE 1 TABLET BY MOUTH  DAILY  . [DISCONTINUED] sertraline (ZOLOFT) 50 MG tablet TAKE ONE (1) TABLET BY MOUTH EVERY DAY   No facility-administered encounter medications on file as of 09/04/2016.     Review of Systems  Constitutional: Positive for fatigue. Negative for appetite change and unexpected weight change.  HENT: Negative for congestion and sinus pressure.   Eyes: Negative for pain and visual disturbance.  Respiratory: Negative for cough, chest tightness and shortness of breath.   Cardiovascular: Negative for chest pain, palpitations and leg swelling.  Gastrointestinal: Negative for abdominal pain, diarrhea, nausea and vomiting.  Genitourinary: Negative for difficulty urinating and dysuria.  Musculoskeletal: Negative for back pain and joint swelling.  Skin:  Negative for color change and rash.  Neurological: Negative for dizziness, light-headedness and headaches.       Tingling - finger tips.    Hematological: Negative for adenopathy. Does not bruise/bleed easily.  Psychiatric/Behavioral: Negative for agitation and dysphoric mood.       Objective:    Physical Exam  Constitutional: She is oriented to person, place, and time. She appears well-developed and well-nourished. No distress.  HENT:  Nose: Nose normal.  Mouth/Throat: Oropharynx is clear and moist.  Eyes: Right eye exhibits no discharge. Left eye exhibits no discharge. No scleral icterus.  Neck: Neck supple. No thyromegaly present.  Cardiovascular: Normal rate and regular rhythm.   Pulmonary/Chest: Breath sounds normal. No accessory muscle usage. No tachypnea. No respiratory distress. She has no decreased breath sounds. She has no wheezes. She has no rhonchi. Right breast exhibits no inverted nipple, no mass, no nipple discharge and no tenderness (no axillary adenopathy). Left breast exhibits no inverted nipple, no mass, no nipple discharge and no tenderness (no axilarry adenopathy).  Abdominal: Soft. Bowel sounds are normal. There is no tenderness.  Musculoskeletal: She exhibits no edema or tenderness.  Lymphadenopathy:    She has no cervical adenopathy.  Neurological: She is alert and oriented to person, place, and time.  Skin: Skin is warm. No rash noted. No erythema.  Psychiatric: She has a normal mood and affect. Her behavior is normal.    BP (!) 144/82 (BP Location: Left Arm, Patient Position: Sitting, Cuff Size: Large)   Pulse 61   Temp 98.6 F (37 C) (Oral)   Resp 12   Ht 5\' 3"  (1.6 m)   Wt 211 lb (95.7 kg)   SpO2 96%   BMI 37.38 kg/m  Wt Readings from Last 3 Encounters:  09/04/16 211 lb (95.7 kg)  07/29/16 210 lb 12.8 oz (95.6 kg)  07/09/16 209 lb (94.8 kg)     Lab Results  Component Value Date   WBC 6.2 09/04/2016   HGB 14.7 09/04/2016   HCT 43.4  09/04/2016   PLT 204.0 09/04/2016   GLUCOSE 99 09/04/2016   CHOL 187 09/04/2016   TRIG 186.0 (H) 09/04/2016   HDL 45.80 09/04/2016   LDLDIRECT 97.0 12/14/2015   LDLCALC 104 (H) 09/04/2016   ALT 13 09/04/2016   AST 14 09/04/2016   NA 140 09/04/2016   K 3.9 09/04/2016   CL 104 09/04/2016   CREATININE 0.74 09/04/2016   BUN 13 09/04/2016   CO2 29 09/04/2016   TSH 2.47 09/04/2016   HGBA1C 5.5 09/04/2016   MICROALBUR <0.7 06/11/2015       Assessment & Plan:   Problem List Items Addressed This Visit    Atrial fibrillation (  [facility])    Sees cardiology.  Just evaluated.  Stable.        Relevant Medications   atorvastatin (LIPITOR) 10 MG tablet   BMI 37.0-37.9, adult    Discussed diet and exercise.  Follow.       Guillain Barr syndrome [facility])    Recently admitted and diagnosed with Guillain Barre.  Treated with IVIG.  Doing better.  Has f/u with neurology 10/2016.        Relevant Medications   sertraline (ZOLOFT) 50 MG tablet   Health care maintenance    Physical today 09/04/16.  Scheduled for f/u mammogram in 10/2016.        Healthcare maintenance    Physical today 09/04/16.  Mammogram 09/11/15 - [facility] I.        Hyperlipidemia    On lipitor.  Low cholesterol diet and exercise.  Follow lipid panel and liver function tests.        Relevant Medications   atorvastatin (LIPITOR) 10 MG tablet   Hypertension    Blood pressure has been under reasonable control.  States averaging 120/80.  Hold on changing medications.  Follow meds.  Follow metabolic panel.       Relevant Medications   atorvastatin (LIPITOR) 10 MG tablet   Hypothyroidism    On thyroid replacement.  Follow tsh.       Relevant Medications   levothyroxine (SYNTHROID, LEVOTHROID) 50 MCG tablet    Other Visit Diagnoses    Screening for breast cancer    -  Primary   Relevant Orders   MM DIGITAL SCREENING BILATERAL   Estrogen deficiency       Relevant Orders   DG Bone Density   Hyperglycemia            Einar Pheasant, MD

## 2016-09-05 ENCOUNTER — Encounter: Payer: Self-pay | Admitting: Internal Medicine

## 2016-09-06 ENCOUNTER — Encounter: Payer: Self-pay | Admitting: Internal Medicine

## 2016-09-06 DIAGNOSIS — Z8669 Personal history of other diseases of the nervous system and sense organs: Secondary | ICD-10-CM | POA: Insufficient documentation

## 2016-09-06 NOTE — Assessment & Plan Note (Signed)
Blood pressure has been under reasonable control.  States averaging 120/80.  Hold on changing medications.  Follow meds.  Follow metabolic panel.

## 2016-09-06 NOTE — Assessment & Plan Note (Signed)
On lipitor.  Low cholesterol diet and exercise.  Follow lipid panel and liver function tests.   

## 2016-09-06 NOTE — Assessment & Plan Note (Signed)
Discussed diet and exercise.  Follow.  

## 2016-09-06 NOTE — Assessment & Plan Note (Signed)
Recently admitted and diagnosed with Bonnie Shaw.  Treated with IVIG.  Doing better.  Has f/u with neurology 10/2016.

## 2016-09-06 NOTE — Assessment & Plan Note (Signed)
On thyroid replacement.  Follow tsh.  

## 2016-09-06 NOTE — Assessment & Plan Note (Signed)
Physical today 09/04/16.  Scheduled for f/u mammogram in 10/2016.

## 2016-09-06 NOTE — Assessment & Plan Note (Signed)
Sees cardiology.  Just evaluated.  Stable.

## 2016-10-16 ENCOUNTER — Ambulatory Visit
Admission: RE | Admit: 2016-10-16 | Discharge: 2016-10-16 | Disposition: A | Discharge status: Home / Self Care | Payer: 59 | Source: Ambulatory Visit | Attending: Internal Medicine | Admitting: Internal Medicine

## 2016-10-16 ENCOUNTER — Encounter: Payer: Self-pay | Admitting: Internal Medicine

## 2016-10-16 DIAGNOSIS — Z1239 Encounter for other screening for malignant neoplasm of breast: Secondary | ICD-10-CM

## 2016-10-16 DIAGNOSIS — E2839 Other primary ovarian failure: Secondary | ICD-10-CM | POA: Diagnosis present

## 2016-10-16 DIAGNOSIS — Z1231 Encounter for screening mammogram for malignant neoplasm of breast: Secondary | ICD-10-CM | POA: Insufficient documentation

## 2016-10-31 ENCOUNTER — Other Ambulatory Visit: Payer: Self-pay | Admitting: Internal Medicine

## 2016-10-31 DIAGNOSIS — I480 Paroxysmal atrial fibrillation: Secondary | ICD-10-CM

## 2016-11-20 ENCOUNTER — Encounter: Payer: Self-pay | Admitting: Internal Medicine

## 2016-11-21 ENCOUNTER — Other Ambulatory Visit: Payer: Self-pay | Admitting: Internal Medicine

## 2016-11-29 ENCOUNTER — Other Ambulatory Visit: Payer: Self-pay | Admitting: Internal Medicine

## 2016-12-02 ENCOUNTER — Other Ambulatory Visit: Payer: Self-pay | Admitting: Internal Medicine

## 2016-12-18 ENCOUNTER — Ambulatory Visit (INDEPENDENT_AMBULATORY_CARE_PROVIDER_SITE_OTHER): Payer: 59 | Admitting: Internal Medicine

## 2016-12-18 ENCOUNTER — Encounter: Payer: Self-pay | Admitting: Internal Medicine

## 2016-12-18 ENCOUNTER — Other Ambulatory Visit: Payer: Self-pay

## 2016-12-18 VITALS — BP 138/78 | HR 56 | Temp 97.6°F | Resp 14 | Ht 62.99 in | Wt 217.2 lb

## 2016-12-18 DIAGNOSIS — E785 Hyperlipidemia, unspecified: Secondary | ICD-10-CM

## 2016-12-18 DIAGNOSIS — I10 Essential (primary) hypertension: Secondary | ICD-10-CM | POA: Diagnosis not present

## 2016-12-18 DIAGNOSIS — G610 Guillain-Barre syndrome: Secondary | ICD-10-CM | POA: Diagnosis not present

## 2016-12-18 DIAGNOSIS — E039 Hypothyroidism, unspecified: Secondary | ICD-10-CM

## 2016-12-18 DIAGNOSIS — E559 Vitamin D deficiency, unspecified: Secondary | ICD-10-CM

## 2016-12-18 DIAGNOSIS — F419 Anxiety disorder, unspecified: Secondary | ICD-10-CM

## 2016-12-18 DIAGNOSIS — I480 Paroxysmal atrial fibrillation: Secondary | ICD-10-CM

## 2016-12-18 DIAGNOSIS — Z6838 Body mass index (BMI) 38.0-38.9, adult: Secondary | ICD-10-CM

## 2016-12-18 DIAGNOSIS — R739 Hyperglycemia, unspecified: Secondary | ICD-10-CM

## 2016-12-18 LAB — LIPID PANEL
CHOLESTEROL: 188 mg/dL (ref 0–200)
HDL: 44.2 mg/dL (ref >39.00)
LDL Cholesterol: 110 mg/dL — ABNORMAL HIGH (ref 0–99)
NonHDL: 143.45
TRIGLYCERIDES: 167 mg/dL — AB (ref 0.0–149.0)
Total CHOL/HDL Ratio: 4
VLDL: 33.4 mg/dL (ref 0.0–40.0)

## 2016-12-18 LAB — BASIC METABOLIC PANEL
BUN: 13 mg/dL (ref 6–23)
CALCIUM: 9.6 mg/dL (ref 8.4–10.5)
CO2: 28 meq/L (ref 19–32)
CREATININE: 0.76 mg/dL (ref 0.40–1.20)
Chloride: 104 mEq/L (ref 96–112)
GFR: 80.62 mL/min (ref >60.00)
GLUCOSE: 105 mg/dL — AB (ref 70–99)
Potassium: 3.8 mEq/L (ref 3.5–5.1)
Sodium: 141 mEq/L (ref 135–145)

## 2016-12-18 LAB — HEMOGLOBIN A1C: Hgb A1c MFr Bld: 5.7 % (ref 4.6–6.5)

## 2016-12-18 LAB — HEPATIC FUNCTION PANEL
ALT: 12 U/L (ref 0–35)
AST: 15 U/L (ref 0–37)
Albumin: 4.1 g/dL (ref 3.5–5.2)
Alkaline Phosphatase: 58 U/L (ref 39–117)
BILIRUBIN DIRECT: 0.2 mg/dL (ref 0.0–0.3)
BILIRUBIN TOTAL: 0.9 mg/dL (ref 0.2–1.2)
Total Protein: 6.6 g/dL (ref 6.0–8.3)

## 2016-12-18 LAB — VITAMIN D 25 HYDROXY (VIT D DEFICIENCY, FRACTURES): VITD: 28.02 ng/mL — ABNORMAL LOW (ref 30.00–100.00)

## 2016-12-18 MED ORDER — HYDROCHLOROTHIAZIDE 25 MG PO TABS: 25.0000 mg | ORAL_TABLET | Freq: Every day | ORAL | 6 refills | Status: DC

## 2016-12-18 NOTE — Progress Notes (Signed)
Patient ID: Bonnie Shaw, female   DOB: [birth year], 66 y.o.   MRN: [id number]   Subjective:    Patient ID: Bonnie Shaw, female    DOB: [birth year], 66 y.o.   MRN: [id number]  HPI  Patient here for a scheduled follow up.  Was diagnosed with Bonnie Shaw 06/2016.  She is seeing neurology.  Still has some tingling in her fingertips, but otherwise doing well.  She is riding her bike 6 miles per day.  No chest pain.  No sob.  No acid reflux.  No abdominal pain. Bowels moving.  Blood pressures averaging <130/70-80.  Due to f/u with cardiology 02/02/17.     Past Medical History:  Diagnosis Date  . Anxiety   . Cataract   . Hyperlipidemia   . Hypertension   . Hyperthyroidism    s/p RAI therapy  . Obesity   . Palpitations   . Panic disorder   . Paroxysmal atrial fibrillation (HCC)   . Snoring    Past Surgical History:  Procedure Laterality Date  . ABDOMINAL HYSTERECTOMY    . CATARACT EXTRACTION    . KNEE SURGERY     left knee  . THYROID ABLATION  2010   Family History  Problem Relation Age of Onset  . Alzheimer's disease Father   . Stroke Mother   . Cancer Sister        ovarian   . Ovarian cancer Sister 7  . Breast cancer Neg Hx    Social History   Socioeconomic History  . Marital status: Married    Spouse name: None  . Number of children: 4  . Years of education: None  . Highest education level: None  Social Needs  . Financial resource strain: None  . Food insecurity - worry: None  . Food insecurity - inability: None  . Transportation needs - medical: None  . Transportation needs - non-medical: None  Occupational History    Employer: UNEMPLOYED  Tobacco Use  . Smoking status: Never Smoker  . Smokeless tobacco: Never Used  Substance and Sexual Activity  . Alcohol use: No  . Drug use: No  . Sexual activity: None  Other Topics Concern  . 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 atorvastatin (LIPITOR) 10 MG tablet Take 10 mg by mouth daily.  Marland Kitchen atorvastatin (LIPITOR) 20 MG tablet TAKE 1 TABLET BY MOUTH  DAILY  . ELIQUIS 5 MG TABS tablet 1 tablet 2 (two) times daily.  . flecainide (TAMBOCOR) 50 MG tablet TAKE ONE TABLET TWICE DAILY  . fluticasone (FLONASE) 50 MCG/ACT nasal spray Place 1 spray into both nostrils daily.  . hydrochlorothiazide (HYDRODIURIL) 25 MG tablet Take 1 tablet (25 mg total) daily by mouth.  . levothyroxine (SYNTHROID, LEVOTHROID) 50 MCG tablet Take 1 tablet (50 mcg total) by mouth daily.  . metoprolol succinate (TOPROL-XL) 50 MG 24 hr tablet Take 50 mg by mouth daily. Take with or immediately following a meal.  . potassium chloride (K-DUR,KLOR-CON) 10 MEQ tablet Take 1 tablet (10 mEq total) by mouth daily.  . sertraline (ZOLOFT) 50 MG tablet TAKE ONE (1) TABLET BY MOUTH EVERY DAY  . sodium chloride (OCEAN)  0.65 % SOLN nasal spray Place 1 spray into both nostrils as needed for congestion.  . [DISCONTINUED] hydrochlorothiazide (HYDRODIURIL) 25 MG tablet TAKE ONE (1) TABLET EACH DAY  . [DISCONTINUED] hydrochlorothiazide (HYDRODIURIL) 25 MG tablet Take 25 mg by mouth daily.  . [DISCONTINUED] metoprolol succinate (TOPROL-XL) 100 MG 24 hr tablet TAKE 1 TABLET BY MOUTH  DAILY WITH OR IMMEDIATLEY  FOLLOWING A MEAL   No facility-administered encounter medications on file as of 12/18/2016.     Review of Systems  Constitutional: Negative for appetite change and unexpected weight change.  HENT: Negative for congestion and sinus pressure.   Respiratory: Negative for cough, chest tightness and shortness of breath.   Cardiovascular: Negative for chest pain, palpitations and leg swelling.  Gastrointestinal: Negative for abdominal pain, diarrhea, nausea and vomiting.  Genitourinary:  Negative for difficulty urinating and dysuria.  Musculoskeletal: Negative for back pain and joint swelling.  Skin: Negative for color change and rash.  Neurological: Negative for dizziness, light-headedness and headaches.  Psychiatric/Behavioral: Negative for agitation and dysphoric mood.       Objective:     Blood pressure rechecked by me:  138/78  Physical Exam  Constitutional: She appears well-developed and well-nourished. No distress.  HENT:  Nose: Nose normal.  Mouth/Throat: Oropharynx is clear and moist.  Neck: Neck supple. No thyromegaly present.  Cardiovascular: Normal rate and regular rhythm.  Pulmonary/Chest: Breath sounds normal. No respiratory distress. She has no wheezes.  Abdominal: Soft. Bowel sounds are normal. There is no tenderness.  Musculoskeletal: She exhibits no edema or tenderness.  Lymphadenopathy:    She has no cervical adenopathy.  Skin: No rash noted. No erythema.  Psychiatric: She has a normal mood and affect. Her behavior is normal.    BP 138/78   Pulse (!) 56   Temp 97.6 F (36.4 C) (Oral)   Resp 14   Ht 5' 2.99" (1.6 m)   Wt 217 lb 3.2 oz (98.5 kg)   SpO2 96%   BMI 38.49 kg/m  Wt Readings from Last 3 Encounters:  12/18/16 217 lb 3.2 oz (98.5 kg)  09/04/16 211 lb (95.7 kg)  07/29/16 210 lb 12.8 oz (95.6 kg)     Lab Results  Component Value Date   WBC 6.2 09/04/2016   HGB 14.7 09/04/2016   HCT 43.4 09/04/2016   PLT 204.0 09/04/2016   GLUCOSE 105 (H) 12/18/2016   CHOL 188 12/18/2016   TRIG 167.0 (H) 12/18/2016   HDL 44.20 12/18/2016   LDLDIRECT 97.0 12/14/2015   LDLCALC 110 (H) 12/18/2016   ALT 12 12/18/2016   AST 15 12/18/2016   NA 141 12/18/2016   K 3.8 12/18/2016   CL 104 12/18/2016   CREATININE 0.76 12/18/2016   BUN 13 12/18/2016   CO2 28 12/18/2016   TSH 2.47 09/04/2016   HGBA1C 5.7 12/18/2016   MICROALBUR <0.7 06/11/2015    Dg Bone Density  Result Date: 10/16/2016 EXAM: DUAL X-RAY ABSORPTIOMETRY (DXA) FOR BONE  MINERAL DENSITY IMPRESSION: Dear Dr. Einar Pheasant, Your patient Bonnie Shaw completed a BMD test on 10/16/2016 using the [place] (analysis version: 14.10) manufactured by [employer]. The following summarizes the results of our evaluation. PATIENT BIOGRAPHICAL: Name: Yarithza, Mink Patient ID:  [id number] Birth Date: 1951-01-19 Height:     62.0 in. Weight:     211.0 lbs. Gender:      Female Exam Date:  10/16/2016 Indications: Caucasian, Hyperthyroid, Hysterectomy, Oophorectomy Bilateral, Postmenopausal Fractures: Treatments: Synthroid ASSESSMENT: The BMD measured at  Forearm Radius 33% is 0.862 g/cm2 with a T-score of -0.2. This patient is considered NORMAL according to [facility]) criteria. Lumbar spine was not utilized due to advanced degenerative changes. Site Region Measured Measured WHO Young Adult BMD Date       Age      Classification T-score DualFemur Neck Right 10/16/[age].0 Normal -0.1 1.023 g/cm2 Left Forearm Radius 33% 10/16/2016 66.0 Normal -0.2 0.862 g/cm2 World Health Organization [facility]) criteria for post-menopausal, Caucasian Women: Normal:       T-score at or above -1 SD Osteopenia:   T-score between -1 and -2.5 SD Osteoporosis: T-score at or below -2.5 SD RECOMMENDATIONS: [place] recommends that FDA-approved medical therapies be considered in postmenopausal women and men age 51 or older with a: 1. Hip or vertebral (clinical or morphometric) fracture. 2. T-score of < -2.5 at the spine or hip. 3. Ten-year fracture probability by FRAX of 3% or greater for hip fracture or 20% or greater for major osteoporotic fracture. All treatment decisions require clinical judgment and consideration of individual patient factors, including patient preferences, co-morbidities, previous drug use, risk factors not captured in the FRAX model (e.g. falls, vitamin D deficiency, increased bone turnover, interval significant decline in bone density) and  possible under - or over-estimation of fracture risk by FRAX. All patients should ensure an adequate intake of dietary calcium (1200 mg/d) and vitamin D (800 IU daily) unless contraindicated. FOLLOW-UP: People with diagnosed cases of osteoporosis or at high risk for fracture should have regular bone mineral density tests. For patients eligible for Medicare, routine testing is allowed once every 2 years. The testing frequency can be increased to one year for patients who have rapidly progressing disease, those who are receiving or discontinuing medical therapy to restore bone mass, or have additional risk factors. I have reviewed this report, and agree with the above findings. Mark A. Thornton Papas, M.D. [facility] Electronically Signed   By: Lavonia Dana M.D.   On: 10/16/2016 11:08   Mm Screening Breast Tomo Bilateral  Result Date: 10/16/2016 CLINICAL DATA:  Screening. EXAM: 2D DIGITAL SCREENING BILATERAL MAMMOGRAM WITH CAD AND ADJUNCT TOMO COMPARISON:  Previous exam(s). ACR Breast Density Category b: There are scattered areas of fibroglandular density. FINDINGS: There are no findings suspicious for malignancy. Images were processed with CAD. IMPRESSION: No mammographic evidence of malignancy. A result letter of this screening mammogram will be mailed directly to the patient. RECOMMENDATION: Screening mammogram in one year. (Code:SM-B-01Y) BI-RADS CATEGORY  1: Negative. Electronically Signed   By: Franki Cabot M.D.   On: 10/16/2016 12:40       Assessment & Plan:   Problem List Items Addressed This Visit    Anxiety    Stable on sertraline.        Atrial fibrillation (HCC)    Stable.  Followed by cardiology.  Continue current medication regimen.        Relevant Medications   hydrochlorothiazide (HYDRODIURIL) 25 MG tablet   BMI 38.0-38.9,adult    Discussed diet and exercise.  Follow.        Guillain Barr syndrome ([place])    Some residual tingling in her fingertips.  Otherwise doing well.  S/p  treatment with IVIG.  Followed by neurology.        Hyperlipidemia   Relevant Medications   hydrochlorothiazide (HYDRODIURIL) 25 MG tablet   Other Relevant Orders   Lipid panel (Completed)   Hepatic function panel (Completed)   Hepatic function panel  Lipid panel   Hypertension - Primary   Relevant Medications   hydrochlorothiazide (HYDRODIURIL) 25 MG tablet   Other Relevant Orders   Basic metabolic panel (Completed)   Basic metabolic panel   Hypothyroidism    On thyroid replacement.  Follow tsh.        Vitamin D deficiency   Relevant Orders   VITAMIN D 25 Hydroxy (Vit-D Deficiency, Fractures) (Completed)    Other Visit Diagnoses    Hyperglycemia       Relevant Orders   Hemoglobin A1c (Completed)   Hemoglobin A1c       Einar Pheasant, MD

## 2016-12-20 ENCOUNTER — Encounter: Payer: Self-pay | Admitting: Internal Medicine

## 2016-12-20 NOTE — Assessment & Plan Note (Signed)
Stable on sertraline.

## 2016-12-20 NOTE — Assessment & Plan Note (Signed)
Some residual tingling in her fingertips.  Otherwise doing well.  S/p treatment with IVIG.  Followed by neurology.

## 2016-12-20 NOTE — Assessment & Plan Note (Signed)
On thyroid replacement.  Follow tsh.  

## 2016-12-20 NOTE — Assessment & Plan Note (Signed)
Discussed diet and exercise.  Follow.  

## 2016-12-20 NOTE — Assessment & Plan Note (Signed)
Stable.  Followed by cardiology.  Continue current medication regimen.   

## 2016-12-23 ENCOUNTER — Telehealth: Payer: Self-pay | Admitting: Internal Medicine

## 2016-12-23 NOTE — Telephone Encounter (Signed)
Please advise 

## 2016-12-23 NOTE — Telephone Encounter (Signed)
Copied from [place] [id number]. Topic: Inquiry >> Dec 23, 2016  3:55 PM Cecelia Byars, NT wrote: Reason for PEJ:[id number] says she is returning [place] s call please call on cell [phone] , house not working phone also  waiting for return call back  from [place] as well

## 2016-12-23 NOTE — Telephone Encounter (Signed)
Copied from [place] [id number]. Topic: Inquiry >> Dec 23, 2016  3:55 PM Cecelia Byars, NT wrote: Reason for OMV:[id number] says she is returning [place] s call please call on cell [phone] , house not working phone also  waiting for return call back  from [place] as well

## 2016-12-24 ENCOUNTER — Telehealth: Payer: Self-pay

## 2016-12-24 NOTE — Telephone Encounter (Signed)
Spoke to patient see lab notes.  

## 2016-12-24 NOTE — Telephone Encounter (Signed)
Copied from [place] [id number]. Topic: Inquiry >> Dec 24, 2016 10:54 AM Patrice Paradise wrote: Reason for CRM: Patient is requesting a call back from [facility] about her Lipitor. Patient thought it was suppose to be 10 mg not 20 mg. Patient is also requesting a call from [facility], stated that Dr. Nicki Reaper wanted her to ask her something.

## 2016-12-30 ENCOUNTER — Telehealth: Payer: Self-pay | Admitting: Internal Medicine

## 2016-12-30 ENCOUNTER — Other Ambulatory Visit: Payer: Self-pay | Admitting: Internal Medicine

## 2016-12-30 NOTE — Telephone Encounter (Signed)
Please advise 

## 2016-12-30 NOTE — Telephone Encounter (Signed)
Pt called

## 2016-12-30 NOTE — Telephone Encounter (Signed)
Per Dr. Venora Maples lab notes from 12/22/16 she wanted to increase from 10mg  to 20mg . Pt confirmed on 20mg  not 10mg . Agreed to discuss more at next appointment.

## 2016-12-30 NOTE — Telephone Encounter (Signed)
Copied from [place]. Topic: Quick Communication - See Telephone Encounter >> Dec 30, 2016 10:48 AM Cleaster Corin, NT wrote: CRM for notification. See Telephone encounter for:   12/30/16.Reason for CRM: Patient is requesting a call back from [place] about her Lipitor. Patient thought it was suppose to be 10 mg not 20 mg. Patient is also requesting a call from [facility], stated that Dr. Nicki Reaper wanted her to ask her something.

## 2016-12-31 NOTE — Telephone Encounter (Signed)
To clarify, per note, pt should be on 20mg  of lipitor.  Also, see her phone message.  Once pt notified, please send note to [place].  She had questions for Caryl Pina (per original phone message)

## 2016-12-31 NOTE — Telephone Encounter (Signed)
Patient is taking the 20mg  of lipitor.  Also patient no longer needs to speak with [place]. Issue has already been handled.

## 2017-02-02 ENCOUNTER — Ambulatory Visit (INDEPENDENT_AMBULATORY_CARE_PROVIDER_SITE_OTHER): Payer: 59 | Admitting: Internal Medicine

## 2017-02-02 ENCOUNTER — Encounter: Payer: Self-pay | Admitting: Internal Medicine

## 2017-02-02 VITALS — BP 130/72 | HR 50 | Ht 63.0 in | Wt 216.0 lb

## 2017-02-02 DIAGNOSIS — I480 Paroxysmal atrial fibrillation: Secondary | ICD-10-CM

## 2017-02-02 DIAGNOSIS — I10 Essential (primary) hypertension: Secondary | ICD-10-CM

## 2017-02-02 MED ORDER — METOPROLOL SUCCINATE ER 25 MG PO TB24: 25.0000 mg | ORAL_TABLET | Freq: Every day | ORAL | 0 refills | Status: DC

## 2017-02-02 MED ORDER — METOPROLOL SUCCINATE ER 25 MG PO TB24: 25.0000 mg | ORAL_TABLET | Freq: Every day | ORAL | 3 refills | Status: DC

## 2017-02-02 NOTE — Progress Notes (Signed)
   PCP: Einar Pheasant, MD Primary Cardiologist: previously Dr Acie Fredrickson in [place] Primary EP: Dr Rayann Heman  Bonnie Shaw is a 66 y.o. female who presents today for routine electrophysiology followup.  Since last being seen in our clinic, the patient reports doing very well.  Today, she denies symptoms of palpitations, chest pain, shortness of breath,  lower extremity edema, dizziness, presyncope, or syncope.  The patient is otherwise without complaint today.   Past Medical History:  Diagnosis Date  . Anxiety   . Cataract   . Hyperlipidemia   . Hypertension   . Hyperthyroidism    s/p RAI therapy  . Obesity   . Palpitations   . Panic disorder   . Paroxysmal atrial fibrillation (HCC)   . Snoring    Past Surgical History:  Procedure Laterality Date  . ABDOMINAL HYSTERECTOMY    . CATARACT EXTRACTION    . KNEE SURGERY     left knee  . THYROID ABLATION  2010    ROS- all systems are reviewed and negatives except as per HPI above  Current Outpatient Medications  Medication Sig Dispense Refill  . atorvastatin (LIPITOR) 20 MG tablet TAKE 1 TABLET BY MOUTH  DAILY 90 tablet 1  . ELIQUIS 5 MG TABS tablet TAKE 1 TABLET BY MOUTH TWICE DAILY 180 tablet 1  . flecainide (TAMBOCOR) 50 MG tablet TAKE ONE TABLET TWICE DAILY 180 tablet 1  . hydrochlorothiazide (HYDRODIURIL) 25 MG tablet Take 1 tablet (25 mg total) daily by mouth. 30 tablet 6  . levothyroxine (SYNTHROID, LEVOTHROID) 50 MCG tablet Take 1 tablet (50 mcg total) by mouth daily. 90 tablet 1  . metoprolol succinate (TOPROL-XL) 50 MG 24 hr tablet Take 50 mg by mouth daily. Take with or immediately following a meal.    . potassium chloride (K-DUR,KLOR-CON) 10 MEQ tablet Take 1 tablet (10 mEq total) by mouth daily. 90 tablet 1  . sertraline (ZOLOFT) 50 MG tablet TAKE ONE (1) TABLET BY MOUTH EVERY DAY 90 tablet 0   No current facility-administered medications for this visit.     Physical Exam: Vitals:   02/02/17 0750  BP: 130/72    Pulse: (!) 50  SpO2: 98%  Weight: 216 lb (98 kg)  Height: 5\' 3"  (1.6 m)    GEN- The patient is well appearing, alert and oriented x 3 today.   Head- normocephalic, atraumatic Eyes-  Sclera clear, conjunctiva pink Ears- hearing intact Oropharynx- clear Lungs- Clear to ausculation bilaterally, normal work of breathing Heart- Regular rate and rhythm, no murmurs, rubs or gallops, PMI not laterally displaced GI- soft, NT, ND, + BS Extremities- no clubbing, cyanosis, or edema  EKG tracing ordered today is personally reviewed and shows sinus bradycardia 48 bpm, otherwise normal ekg  Assessment and Plan:  1. Paroxysmal atrial fibrillation She has previously described severe LA enlargement Maintaining sinus rhythm with flecainide. Reduce toprol to 25mg  daily due to low HR On eliquis for chads2vasc score of 3.  2. Morbid obesity Body mass index is 38.26 kg/m. Lifestyle modification encouraged  3. HTN Stable No change required today  Follow-up with Butch Penny in the AF clinic in 6 months Update echo at that time  Thompson Grayer MD, [facility] 02/02/2017 7:57 AM

## 2017-02-02 NOTE — Patient Instructions (Signed)
Medication Instructions: Your physician has recommended you make the following change in your medication:  -1) DECREASE Metoprolol Succinate (Toprol XL)  25 mg - Take 1 tablet by mouth daily  Labwork: None Ordered  Procedures/Testing: None Ordered  Follow-Up: Your physician wants you to follow-up in: 6 MONTHS with Doristine Devoid, NP in the [facility]. You will receive a reminder letter in the mail two months in advance. If you don't receive a letter, please call our office to schedule the follow-up appointment.   If you need a refill on your cardiac medications before your next appointment, please call your pharmacy.

## 2017-02-05 ENCOUNTER — Other Ambulatory Visit: Payer: Self-pay | Admitting: Internal Medicine

## 2017-02-06 ENCOUNTER — Telehealth: Payer: Self-pay | Admitting: Internal Medicine

## 2017-02-06 NOTE — Telephone Encounter (Signed)
Returned call to patient. Patient states she is at [facility] felt very lightheaded and drained. She states she felt like she went into afib but she feels fine now, denies chest pain, sob, or syncope. Patient is going to get her daughter to come pick her up as she does not feel comfortable enough to drive. She states her metoprolol was decreased from 50 to 25 mg once a day. She checked BP last night at 125/76 HR 64. Unable to obtain vital signs during the call. Informed patient I would send to Dr. Rayann Heman for further recommendations. Patient verbalized understanding and thanked me for the call.

## 2017-02-06 NOTE — Telephone Encounter (Signed)
New Message  Pt c/o medication issue:  1. Name of Medication: metoprolol   2. How are you currently taking this medication (dosage and times per day)? 25mg    3. Are you having a reaction (difficulty breathing--STAT)? Lightheadedness bp last night 125/76  4. What is your medication issue? Per pt would like to discuss

## 2017-02-23 ENCOUNTER — Telehealth: Payer: Self-pay | Admitting: Internal Medicine

## 2017-02-23 NOTE — Telephone Encounter (Signed)
New Message  Pt c/o medication issue:  1. Name of Medication: metoprolol succinate (TOPROL XL)   2. How are you currently taking this medication (dosage and times per day)? 25mg  once a day 3. Are you having a reaction (difficulty breathing--STAT)?  4. What is your medication issue? Patient states that Dr. Rayann Heman lowered the metoprolol due to her low pulse. But since the change she is experiencing Afib. Her BP was 136/86 in addition to the pulse rate being 90. She has been experiencing being light headed as well. Please call.

## 2017-02-23 NOTE — Telephone Encounter (Signed)
Will see Roderic Palau, NP in afib clinic on Wed at 9am to discuss concerns.

## 2017-02-25 ENCOUNTER — Ambulatory Visit (HOSPITAL_COMMUNITY)
Admission: RE | Admit: 2017-02-25 | Discharge: 2017-02-25 | Disposition: A | Discharge status: Home / Self Care | Payer: 59 | Source: Ambulatory Visit | Attending: Nurse Practitioner | Admitting: Nurse Practitioner

## 2017-02-25 ENCOUNTER — Encounter (HOSPITAL_COMMUNITY): Payer: Self-pay | Admitting: Nurse Practitioner

## 2017-02-25 VITALS — BP 136/78 | HR 66 | Ht 63.0 in | Wt 219.0 lb

## 2017-02-25 DIAGNOSIS — E0590 Thyrotoxicosis, unspecified without thyrotoxic crisis or storm: Secondary | ICD-10-CM | POA: Insufficient documentation

## 2017-02-25 DIAGNOSIS — F419 Anxiety disorder, unspecified: Secondary | ICD-10-CM | POA: Diagnosis not present

## 2017-02-25 DIAGNOSIS — Z79899 Other long term (current) drug therapy: Secondary | ICD-10-CM | POA: Insufficient documentation

## 2017-02-25 DIAGNOSIS — E785 Hyperlipidemia, unspecified: Secondary | ICD-10-CM | POA: Insufficient documentation

## 2017-02-25 DIAGNOSIS — Z882 Allergy status to sulfonamides status: Secondary | ICD-10-CM | POA: Insufficient documentation

## 2017-02-25 DIAGNOSIS — Z7901 Long term (current) use of anticoagulants: Secondary | ICD-10-CM | POA: Insufficient documentation

## 2017-02-25 DIAGNOSIS — I480 Paroxysmal atrial fibrillation: Secondary | ICD-10-CM

## 2017-02-25 DIAGNOSIS — E669 Obesity, unspecified: Secondary | ICD-10-CM | POA: Insufficient documentation

## 2017-02-25 DIAGNOSIS — I10 Essential (primary) hypertension: Secondary | ICD-10-CM | POA: Insufficient documentation

## 2017-02-25 NOTE — Progress Notes (Signed)
Patient ID: Bonnie Shaw, female   DOB: 12-Aug-1950, 67 y.o.   MRN: [id number]     Primary Care Physician: Einar Pheasant, MD Referring Physician: Dr. Donnie Mesa is a 67 y.o. female with a h/o PAF that was started on flecainide 50 mg bid by Dr. Rayann Heman latter part of June 2017.  She has not had any further afib and doing well tolerating flecainide. HR is usually in 50's at home, tolerating with minimal dizziness. She has had a sleep study and was negative. Echo showed enlargement of left atrium of 50 mm. Despite this, she has done well with minimum breakthrough afib.  She returns to afib clinic 07/29/16. She reports that she had a persistent URI and then started developing neuro symptoms of ataxia and change in sensation with touch. She was admitted to [facility], could not walk the next am and was later transferred to [facility] center for possible [place]ounds which spinal tap confirmed and pt was treat with immunoglobulin  therapy.She was sent to SNF, was recovered quickly and was d/c after 5 days. She did not have any issues with afib while there.  F/u in afib clinic 1/23, on last visit she saw Dr. Rayann Heman in December, BB was reduced for bradycardia. Since then she has noted a few episodes of afib, longest x one hour and HR was less than one hundred. She states that she lets her anxiety get the best of her and was afraid these short breakthrough episodes may damage her heart. HR is trending in the 60's with reduction of BB.   Today, she denies symptoms of palpitations, chest pain, shortness of breath, orthopnea, PND, lower extremity edema, dizziness, presyncope, syncope, or neurologic sequela. The patient is tolerating medications without difficulties and is otherwise without complaint today.   Past Medical History:  Diagnosis Date  . Anxiety   . Cataract   . Hyperlipidemia   . Hypertension   . Hyperthyroidism    s/p RAI therapy  . Obesity   . Palpitations   . Panic disorder     . Paroxysmal atrial fibrillation (HCC)   . Snoring    Past Surgical History:  Procedure Laterality Date  . ABDOMINAL HYSTERECTOMY    . CATARACT EXTRACTION    . KNEE SURGERY     left knee  . THYROID ABLATION  2010    Current Outpatient Medications  Medication Sig Dispense Refill  . atorvastatin (LIPITOR) 20 MG tablet TAKE 1 TABLET BY MOUTH  DAILY 90 tablet 1  . ELIQUIS 5 MG TABS tablet TAKE 1 TABLET BY MOUTH TWICE DAILY 180 tablet 1  . flecainide (TAMBOCOR) 50 MG tablet TAKE ONE TABLET TWICE DAILY 180 tablet 1  . hydrochlorothiazide (HYDRODIURIL) 25 MG tablet Take 1 tablet (25 mg total) daily by mouth. 30 tablet 6  . levothyroxine (SYNTHROID, LEVOTHROID) 50 MCG tablet Take 1 tablet (50 mcg total) by mouth daily. 90 tablet 1  . metoprolol succinate (TOPROL XL) 25 MG 24 hr tablet Take 1 tablet (25 mg total) by mouth daily. 90 tablet 3  . potassium chloride (K-DUR,KLOR-CON) 10 MEQ tablet Take 1 tablet (10 mEq total) by mouth daily. 90 tablet 1  . sertraline (ZOLOFT) 50 MG tablet TAKE ONE TABLET BY MOUTH EVERY DAY 90 tablet 0   No current facility-administered medications for this encounter.     Allergies  Allergen Reactions  . Ativan [Lorazepam]   . Meloxicam Other (See Comments)  . Sulfonamide Derivatives  Unknown reaction    Social History   Socioeconomic History  . Marital status: Married    Spouse name: Not on file  . Number of children: 4  . Years of education: Not on file  . Highest education level: Not on file  Social Needs  . Financial resource strain: Not on file  . Food insecurity - worry: Not on file  . Food insecurity - inability: Not on file  . Transportation needs - medical: Not on file  . Transportation needs - non-medical: Not on file  Occupational History    Employer: UNEMPLOYED  Tobacco Use  . Smoking status: Never Smoker  . Smokeless tobacco: Never Used  Substance and Sexual Activity  . Alcohol use: No  . Drug use: No  . Sexual activity: Not  on file  Other Topics Concern  . 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 Alzheimer's disease Father   . Stroke Mother   . Cancer Sister        ovarian   . Ovarian cancer Sister 60  . Breast cancer Neg Hx     ROS- All systems are reviewed and negative except as per the HPI above  Physical Exam: Vitals:   02/25/17 0900  BP: 136/78  Pulse: 66  Weight: 219 lb (99.3 kg)  Height: 5\' 3"  (1.6 m)    GEN- The patient is well appearing, alert and oriented x 3 today.   Head- normocephalic, atraumatic Eyes-  Sclera clear, conjunctiva pink Ears- hearing intact Oropharynx- clear Neck- supple, no JVP Lymph- no cervical lymphadenopathy Lungs- Clear to ausculation bilaterally, normal work of breathing Heart- Regular rate and rhythm, no murmurs, rubs or gallops, PMI not laterally displaced GI- soft, NT, ND, + BS Extremities- no clubbing, cyanosis, or edema MS- no significant deformity or atrophy Skin- no rash or lesion Psych- euthymic mood, full affect Neuro- strength and sensation are intact  EKG- NSR at 66 bpm, pr int 158 ms, qrs int 76 ms, qtc 450 ms Epic records reviewed   Assessment and Plan: 1. Paroxymal afib Recent brady and metoprolol was reduced to 25 mg a day Small increase in afib burden with this change, but overall afib burden very small, but pt was very worried that these episodes may damage her heart HER v rate does not go over  Continue on eliquis 5 mg bid  2. HTN Stable  3. Guillain Berre Resolved   F/u with afib clinic in 6 months  [name]. Karmello Abercrombie, [facility] [address] [place], [place] [address] [phone]

## 2017-04-08 ENCOUNTER — Other Ambulatory Visit: Payer: Self-pay | Admitting: Internal Medicine

## 2017-04-08 DIAGNOSIS — I480 Paroxysmal atrial fibrillation: Secondary | ICD-10-CM

## 2017-04-08 NOTE — Telephone Encounter (Signed)
Copied from [place] [id number]. Topic: Quick Communication - Rx Refill/Question >> Apr 08, 2017 11:42 AM Arletha Grippe wrote: Medication: hydrochlorothiazide (HYDRODIURIL) 25 MG tablet, sertraline (ZOLOFT) 50 MG tablet, flecainide (TAMBOCOR) 50 MG tablet      Has the patient contacted their pharmacy? Yes.   Was told to contact office   (Agent: If no, request that the patient contact the pharmacy for the refill.)   Preferred Pharmacy (with phone number or street name): [facility] - [phone]   Agent: Please be advised that RX refills may take up to 3 business days. We ask that you follow-up with your pharmacy.

## 2017-04-09 ENCOUNTER — Other Ambulatory Visit: Payer: Self-pay

## 2017-04-09 DIAGNOSIS — I480 Paroxysmal atrial fibrillation: Secondary | ICD-10-CM

## 2017-04-09 MED ORDER — FLECAINIDE ACETATE 50 MG PO TABS: 50.0000 mg | ORAL_TABLET | Freq: Two times a day (BID) | ORAL | 1 refills | Status: DC

## 2017-04-09 NOTE — Telephone Encounter (Signed)
Left message for pt. To have [facility] transfer refills from [place].

## 2017-04-15 ENCOUNTER — Telehealth: Payer: Self-pay | Admitting: Internal Medicine

## 2017-04-15 ENCOUNTER — Other Ambulatory Visit: Payer: Self-pay

## 2017-04-15 MED ORDER — HYDROCHLOROTHIAZIDE 25 MG PO TABS: 25.0000 mg | ORAL_TABLET | Freq: Every day | ORAL | 0 refills | Status: DC

## 2017-04-15 MED ORDER — SERTRALINE HCL 50 MG PO TABS: ORAL_TABLET | ORAL | 0 refills | Status: DC

## 2017-04-15 NOTE — Telephone Encounter (Signed)
Patient called, left VM that the medications have already been filled and to call the office back if she has questions.

## 2017-04-15 NOTE — Telephone Encounter (Signed)
Copied from [place] [id number]. Topic: Quick Communication - Rx Refill/Question >> Apr 15, 2017 12:39 PM Bonnie Shaw wrote: Medication   flecainide (TAMBOCOR) 50 MG tablet              hydrochlorothiazide (HYDRODIURIL) 25 MG tablet                Sertraline (ZOLOFT) 50 MG tablet   PT NEED NEW RX SENT TO THE BELOW PHARMACY , SHE IS NOW USING MAIL ORDER     Preferred Pharmacy   [facility]   Agent: Please be advised that RX refills may take up to 3 business days. We ask that you follow-up with your pharmacy.

## 2017-04-22 ENCOUNTER — Other Ambulatory Visit (INDEPENDENT_AMBULATORY_CARE_PROVIDER_SITE_OTHER): Payer: 59

## 2017-04-22 DIAGNOSIS — I10 Essential (primary) hypertension: Secondary | ICD-10-CM

## 2017-04-22 DIAGNOSIS — R739 Hyperglycemia, unspecified: Secondary | ICD-10-CM

## 2017-04-22 DIAGNOSIS — E785 Hyperlipidemia, unspecified: Secondary | ICD-10-CM

## 2017-04-22 LAB — LIPID PANEL
Cholesterol: 171 mg/dL (ref 0–200)
HDL: 44.1 mg/dL (ref >39.00)
LDL Cholesterol: 90 mg/dL (ref 0–99)
NONHDL: 127.34
TRIGLYCERIDES: 187 mg/dL — AB (ref 0.0–149.0)
Total CHOL/HDL Ratio: 4
VLDL: 37.4 mg/dL (ref 0.0–40.0)

## 2017-04-22 LAB — BASIC METABOLIC PANEL
BUN: 16 mg/dL (ref 6–23)
CALCIUM: 10.1 mg/dL (ref 8.4–10.5)
CO2: 24 mEq/L (ref 19–32)
Chloride: 105 mEq/L (ref 96–112)
Creatinine, Ser: 0.9 mg/dL (ref 0.40–1.20)
GFR: 66.26 mL/min (ref >60.00)
Glucose, Bld: 123 mg/dL — ABNORMAL HIGH (ref 70–99)
Potassium: 4.2 mEq/L (ref 3.5–5.1)
SODIUM: 143 meq/L (ref 135–145)

## 2017-04-22 LAB — HEPATIC FUNCTION PANEL
ALK PHOS: 57 U/L (ref 39–117)
ALT: 14 U/L (ref 0–35)
AST: 17 U/L (ref 0–37)
Albumin: 4.6 g/dL (ref 3.5–5.2)
BILIRUBIN DIRECT: 0.2 mg/dL (ref 0.0–0.3)
TOTAL PROTEIN: 7.1 g/dL (ref 6.0–8.3)
Total Bilirubin: 0.8 mg/dL (ref 0.2–1.2)

## 2017-04-22 LAB — HEMOGLOBIN A1C: Hgb A1c MFr Bld: 5.7 % (ref 4.6–6.5)

## 2017-04-23 ENCOUNTER — Ambulatory Visit: Payer: 59 | Admitting: Internal Medicine

## 2017-04-23 ENCOUNTER — Encounter: Payer: Self-pay | Admitting: Internal Medicine

## 2017-04-23 VITALS — BP 132/84 | HR 75 | Temp 98.1°F | Resp 18 | Ht 63.0 in | Wt 216.8 lb

## 2017-04-23 DIAGNOSIS — E785 Hyperlipidemia, unspecified: Secondary | ICD-10-CM

## 2017-04-23 DIAGNOSIS — E559 Vitamin D deficiency, unspecified: Secondary | ICD-10-CM

## 2017-04-23 DIAGNOSIS — I480 Paroxysmal atrial fibrillation: Secondary | ICD-10-CM

## 2017-04-23 DIAGNOSIS — Z6838 Body mass index (BMI) 38.0-38.9, adult: Secondary | ICD-10-CM

## 2017-04-23 DIAGNOSIS — F419 Anxiety disorder, unspecified: Secondary | ICD-10-CM

## 2017-04-23 DIAGNOSIS — G610 Guillain-Barre syndrome: Secondary | ICD-10-CM

## 2017-04-23 DIAGNOSIS — Z1159 Encounter for screening for other viral diseases: Secondary | ICD-10-CM

## 2017-04-23 DIAGNOSIS — E039 Hypothyroidism, unspecified: Secondary | ICD-10-CM | POA: Diagnosis not present

## 2017-04-23 DIAGNOSIS — R739 Hyperglycemia, unspecified: Secondary | ICD-10-CM

## 2017-04-23 DIAGNOSIS — I10 Essential (primary) hypertension: Secondary | ICD-10-CM

## 2017-04-23 NOTE — Progress Notes (Signed)
Patient ID: Bonnie Shaw, female   DOB: 04-11-1950, 67 y.o.   MRN: [id number]   Subjective:    Patient ID: Bonnie Shaw, female    DOB: 23-Jul-1950, 67 y.o.   MRN: [id number]  HPI  Patient here for a scheduled follow up.  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  Stable.  Was released from regular follow up.  Also has known afib.  On flecainide.  Last evaluated 02/02/17.  toprol dose reduced.  She reports she is doing well.  Feels good.  Stays active.  Has been playing ball, etc with her grandchildren.  No chest pain.  No sob.  No acid reflux.  No abdominal pain.  Bowels moving.  Some occasional nasal stuffiness.  Discussed using flonase.     Past Medical History:  Diagnosis Date  . Anxiety   . Cataract   . Hyperlipidemia   . Hypertension   . Hyperthyroidism    s/p RAI therapy  . Obesity   . Palpitations   . Panic disorder   . Paroxysmal atrial fibrillation (HCC)   . Snoring    Past Surgical History:  Procedure Laterality Date  . ABDOMINAL HYSTERECTOMY    . CATARACT EXTRACTION    . KNEE SURGERY     left knee  . THYROID ABLATION  2010   Family History  Problem Relation Age of Onset  . Alzheimer's disease Father   . Stroke Mother   . Cancer Sister        ovarian   . Ovarian cancer Sister 50  . Breast cancer Neg Hx    Social History   Socioeconomic History  . Marital status: Married    Spouse name: Not on file  . Number of children: 4  . Years of education: Not on file  . Highest education level: Not on file  Occupational History    Employer: UNEMPLOYED  Social Needs  . Financial resource strain: Not on file  . Food insecurity:    Worry: Not on file    Inability: Not on file  . Transportation needs:    Medical: Not on file    Non-medical: Not on file  Tobacco Use  . Smoking status: Never Smoker  . Smokeless tobacco: Never Used  Substance and Sexual Activity  . Alcohol use: No  . Drug use: No  . Sexual activity: Not on file  Lifestyle  . Physical activity:    Days per  week: Not on file    Minutes per session: Not on file  . Stress: Not on file  Relationships  . Social connections:    Talks on phone: Not on file    Gets together: Not on file    Attends religious service: Not on file    Active member of club or organization: Not on file    Attends meetings of clubs or organizations: Not on file    Relationship status: Not on file  Other Topics Concern  . 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 atorvastatin (LIPITOR) 20 MG tablet TAKE 1 TABLET BY MOUTH  DAILY  . ELIQUIS 5 MG TABS tablet TAKE 1 TABLET BY MOUTH TWICE DAILY  .  flecainide (TAMBOCOR) 50 MG tablet Take 1 tablet (50 mg total) by mouth 2 (two) times daily.  . hydrochlorothiazide (HYDRODIURIL) 25 MG tablet Take 1 tablet (25 mg total) by mouth daily.  Marland Kitchen levothyroxine (SYNTHROID, LEVOTHROID) 50 MCG tablet Take 1 tablet (50 mcg total) by mouth daily.  . metoprolol succinate (TOPROL XL) 25 MG 24 hr tablet Take 1 tablet (25 mg total) by mouth daily.  . potassium chloride (K-DUR,KLOR-CON) 10 MEQ tablet Take 1 tablet (10 mEq total) by mouth daily.  . sertraline (ZOLOFT) 50 MG tablet TAKE ONE TABLET BY MOUTH EVERY DAY   No facility-administered encounter medications on file as of 04/23/2017.     Review of Systems  Constitutional: Negative for appetite change and unexpected weight change.  HENT: Negative for congestion and sinus pressure.   Respiratory: Negative for cough, chest tightness and shortness of breath.   Cardiovascular: Negative for chest pain, palpitations and leg swelling.  Gastrointestinal: Negative for abdominal pain, diarrhea, nausea and vomiting.  Genitourinary: Negative for difficulty urinating and dysuria.    Musculoskeletal: Negative for joint swelling and myalgias.  Skin: Negative for color change and rash.  Neurological: Negative for dizziness, light-headedness and headaches.  Psychiatric/Behavioral: Negative for agitation and dysphoric mood.       Objective:     Blood pressure rechecked by me:  136/82-86  Physical Exam  Constitutional: She appears well-developed and well-nourished. No distress.  HENT:  Nose: Nose normal.  Mouth/Throat: Oropharynx is clear and moist.  Neck: Neck supple. No thyromegaly present.  Cardiovascular: Normal rate and regular rhythm.  Pulmonary/Chest: Breath sounds normal. No respiratory distress. She has no wheezes.  Abdominal: Soft. Bowel sounds are normal. There is no tenderness.  Musculoskeletal: She exhibits no edema or tenderness.  Lymphadenopathy:    She has no cervical adenopathy.  Skin: No rash noted. No erythema.  Psychiatric: She has a normal mood and affect. Her behavior is normal.    BP 132/84   Pulse 75   Temp 98.1 F (36.7 C) (Oral)   Resp 18   Ht _0  (1.6 m)   Wt 216 lb 12.8 oz (98.3 kg)   SpO2 96%   BMI 38.40 kg/m  Wt Readings from Last 3 Encounters:  04/23/17 216 lb 12.8 oz (98.3 kg)  02/25/17 219 lb (99.3 kg)  02/02/17 216 lb (98 kg)     Lab Results  Component Value Date   WBC 6.2 09/04/2016   HGB 14.7 09/04/2016   HCT 43.4 09/04/2016   PLT 204.0 09/04/2016   GLUCOSE 123 (H) 04/22/2017   CHOL 171 04/22/2017   TRIG 187.0 (H) 04/22/2017   HDL 44.10 04/22/2017   LDLDIRECT 97.0 12/14/2015   LDLCALC 90 04/22/2017   ALT 14 04/22/2017   AST 17 04/22/2017   NA 143 04/22/2017   K 4.2 04/22/2017   CL 105 04/22/2017   CREATININE 0.90 04/22/2017   BUN 16 04/22/2017   CO2 24 04/22/2017   TSH 2.47 09/04/2016   HGBA1C 5.7 04/22/2017   MICROALBUR <0.7 06/11/2015       Assessment & Plan:   Problem List Items Addressed This Visit    Anxiety    Stable on sertraline.        Atrial fibrillation (HCC)    Stable.   Followed by cardiology.  Continue current medication regimen.        BMI 38.0-38.9,adult    Discussed diet and exercise.  Follow.        Guillain Barr syndrome ([place])  Doing well.  S/p treatment with IVIG.  Saw neurology.  Has been released from regular f/u.        Hyperglycemia    Low carb diet and exercise.  Follow met b and a1c.        Relevant Orders   Hemoglobin A1c   Hyperlipidemia    Low cholesterol diet and exercise. On lipitor.   Follow lipid panel and liver function tests.         Relevant Orders   Hepatic function panel   Lipid panel   Hypertension    Blood pressure as outlined.  Same medication regimen.  Follow pressures.  Follow metabolic panel.       Relevant Orders   CBC with Differential/Platelet   TSH   Basic metabolic panel   Hypothyroidism    On thyroid replacement.  Follow tsh.        Vitamin D deficiency    Follow vitamin D level.         Other Visit Diagnoses    Need for hepatitis C screening test    -  Primary   Relevant Orders   Hepatitis C antibody       Einar Pheasant, MD

## 2017-04-26 ENCOUNTER — Encounter: Payer: Self-pay | Admitting: Internal Medicine

## 2017-04-26 DIAGNOSIS — R739 Hyperglycemia, unspecified: Secondary | ICD-10-CM | POA: Insufficient documentation

## 2017-04-26 NOTE — Assessment & Plan Note (Signed)
Low carb diet and exercise.  Follow met b and a1c.   

## 2017-04-26 NOTE — Assessment & Plan Note (Signed)
Discussed diet and exercise.  Follow.  

## 2017-04-26 NOTE — Assessment & Plan Note (Signed)
Stable on sertraline.

## 2017-04-26 NOTE — Assessment & Plan Note (Signed)
Low cholesterol diet and exercise.  On lipitor.  Follow lipid panel and liver function tests.   

## 2017-04-26 NOTE — Assessment & Plan Note (Signed)
Blood pressure as outlined.  Same medication regimen.  Follow pressures.  Follow metabolic panel.  

## 2017-04-26 NOTE — Assessment & Plan Note (Signed)
Doing well.  S/p treatment with IVIG.  Saw neurology.  Has been released from regular f/u.

## 2017-04-26 NOTE — Assessment & Plan Note (Signed)
Stable.  Followed by cardiology.  Continue current medication regimen.   

## 2017-04-26 NOTE — Assessment & Plan Note (Signed)
Follow vitamin D level.  

## 2017-04-26 NOTE — Assessment & Plan Note (Signed)
On thyroid replacement.  Follow tsh.  

## 2017-05-03 ENCOUNTER — Other Ambulatory Visit: Payer: Self-pay | Admitting: Internal Medicine

## 2017-06-25 ENCOUNTER — Other Ambulatory Visit: Payer: Self-pay | Admitting: Internal Medicine

## 2017-07-14 ENCOUNTER — Other Ambulatory Visit: Payer: Self-pay | Admitting: Internal Medicine

## 2017-07-14 NOTE — Telephone Encounter (Signed)
Pt is a 67 yr old female who last saw Afib clinic on 02/25/17. Last noted weight in epic on 04/23/17 was 98.3Kg. SCr on 04/22/17 was 0.90. Will send Rx for Eliquis 5mg  BID.

## 2017-09-01 ENCOUNTER — Ambulatory Visit (HOSPITAL_COMMUNITY)
Admission: RE | Admit: 2017-09-01 | Discharge: 2017-09-01 | Disposition: A | Discharge status: Home / Self Care | Payer: 59 | Source: Ambulatory Visit | Attending: Nurse Practitioner | Admitting: Nurse Practitioner

## 2017-09-01 ENCOUNTER — Encounter (HOSPITAL_COMMUNITY): Payer: Self-pay | Admitting: Nurse Practitioner

## 2017-09-01 VITALS — BP 126/82 | HR 83 | Ht 63.0 in | Wt 219.0 lb

## 2017-09-01 DIAGNOSIS — E669 Obesity, unspecified: Secondary | ICD-10-CM | POA: Insufficient documentation

## 2017-09-01 DIAGNOSIS — F419 Anxiety disorder, unspecified: Secondary | ICD-10-CM | POA: Insufficient documentation

## 2017-09-01 DIAGNOSIS — Z79818 Long term (current) use of other agents affecting estrogen receptors and estrogen levels: Secondary | ICD-10-CM | POA: Diagnosis not present

## 2017-09-01 DIAGNOSIS — E785 Hyperlipidemia, unspecified: Secondary | ICD-10-CM | POA: Diagnosis not present

## 2017-09-01 DIAGNOSIS — E0590 Thyrotoxicosis, unspecified without thyrotoxic crisis or storm: Secondary | ICD-10-CM | POA: Diagnosis not present

## 2017-09-01 DIAGNOSIS — Z7901 Long term (current) use of anticoagulants: Secondary | ICD-10-CM | POA: Insufficient documentation

## 2017-09-01 DIAGNOSIS — I480 Paroxysmal atrial fibrillation: Secondary | ICD-10-CM | POA: Insufficient documentation

## 2017-09-01 DIAGNOSIS — Z79899 Other long term (current) drug therapy: Secondary | ICD-10-CM | POA: Diagnosis not present

## 2017-09-01 DIAGNOSIS — I10 Essential (primary) hypertension: Secondary | ICD-10-CM | POA: Insufficient documentation

## 2017-09-01 NOTE — Addendum Note (Signed)
Encounter addended by: Sherran Needs, NP on: 09/01/2017 1:53 PM  Actions taken: Sign clinical note

## 2017-09-01 NOTE — Progress Notes (Addendum)
Patient ID: Bonnie Shaw, female   DOB: 07/21/1950, 67 y.o.   MRN: [id number]     Primary Care Physician: Einar Pheasant, MD Referring Physician: Dr. Donnie Mesa is a 67 y.o. female with a h/o PAF that was started on flecainide 50 mg bid by Dr. Rayann Heman latter part of June 2017.  She has not had any further afib and doing well tolerating flecainide. HR is usually in 50's at home, tolerating with minimal dizziness. She has had a sleep study and was negative. Echo showed enlargement of left atrium of 50 mm. Despite this, she has done well with minimum breakthrough afib.  She returns to afib clinic 07/29/16. She reports that she had a persistent URI and then started developing neuro symptoms of ataxia and change in sensation with touch. She was admitted to [facility], could not walk the next am and was later transferred to [facility] center for possible [place] Founds which spinal tap confirmed and pt was treat with immunoglobulin  therapy.She was sent to SNF, was recovered quickly and was d/c after 5 days. She did not have any issues with afib while there.  F/u in afib clinic 1/23, on last visit she saw Dr. Rayann Heman in December, BB was reduced for bradycardia. Since then she has noted a few episodes of afib, longest x one hour and HR was less than one hundred. She states that she lets her anxiety get the best of her and was afraid these short breakthrough episodes may damage her heart. HR is trending in the 60's with reduction of BB.   F/u in afib clinic, 7/30, pt states she feels well but she is in rate controlled afib. She is unaware. Otherwise. No issues  Today, she denies symptoms of palpitations, chest pain, shortness of breath, orthopnea, PND, lower extremity edema, dizziness, presyncope, syncope, or neurologic sequela. The patient is tolerating medications without difficulties and is otherwise without complaint today.   Past Medical History:  Diagnosis Date  . Anxiety   . Cataract   .  Hyperlipidemia   . Hypertension   . Hyperthyroidism    s/p RAI therapy  . Obesity   . Palpitations   . Panic disorder   . Paroxysmal atrial fibrillation (HCC)   . Snoring    Past Surgical History:  Procedure Laterality Date  . ABDOMINAL HYSTERECTOMY    . CATARACT EXTRACTION    . KNEE SURGERY     left knee  . THYROID ABLATION  2010    Current Outpatient Medications  Medication Sig Dispense Refill  . atorvastatin (LIPITOR) 20 MG tablet TAKE 1 TABLET BY MOUTH  DAILY 90 tablet 1  . ELIQUIS 5 MG TABS tablet TAKE 1 TABLET BY MOUTH TWICE DAILY 180 tablet 3  . flecainide (TAMBOCOR) 50 MG tablet Take 1 tablet (50 mg total) by mouth 2 (two) times daily. 180 tablet 1  . hydrochlorothiazide (HYDRODIURIL) 25 MG tablet TAKE 1 TABLET BY MOUTH  DAILY 90 tablet 0  . levothyroxine (SYNTHROID, LEVOTHROID) 50 MCG tablet TAKE 1 TABLET BY MOUTH  DAILY 90 tablet 1  . metoprolol succinate (TOPROL XL) 25 MG 24 hr tablet Take 1 tablet (25 mg total) by mouth daily. 90 tablet 3  . potassium chloride (K-DUR,KLOR-CON) 10 MEQ tablet TAKE 1 TABLET BY MOUTH  DAILY 90 tablet 1  . sertraline (ZOLOFT) 50 MG tablet TAKE 1 TABLET BY MOUTH  EVERY DAY 90 tablet 0   No current facility-administered medications for this encounter.  Allergies  Allergen Reactions  . Ativan [Lorazepam]   . Meloxicam Other (See Comments)  . Sulfonamide Derivatives     Unknown reaction    Social History   Socioeconomic History  . Marital status: Married    Spouse name: Not on file  . Number of children: 4  . Years of education: Not on file  . Highest education level: Not on file  Occupational History    Employer: UNEMPLOYED  Social Needs  . Financial resource strain: Not on file  . Food insecurity:    Worry: Not on file    Inability: Not on file  . Transportation needs:    Medical: Not on file    Non-medical: Not on file  Tobacco Use  . Smoking status: Never Smoker  . Smokeless tobacco: Never Used  Substance and  Sexual Activity  . Alcohol use: No  . Drug use: No  . Sexual activity: Not on file  Lifestyle  . Physical activity:    Days per week: Not on file    Minutes per session: Not on file  . Stress: Not on file  Relationships  . Social connections:    Talks on phone: Not on file    Gets together: Not on file    Attends religious service: Not on file    Active member of club or organization: Not on file    Attends meetings of clubs or organizations: Not on file    Relationship status: Not on file  . Intimate partner violence:    Fear of current or ex partner: Not on file    Emotionally abused: Not on file    Physically abused: Not on file    Forced sexual activity: Not on file  Other Topics Concern  . 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 Alzheimer's disease Father   . Stroke Mother   . Cancer Sister        ovarian   . Ovarian cancer Sister 39  . Breast cancer Neg Hx     ROS- All systems are reviewed and negative except as per the HPI above  Physical Exam: Vitals:   09/01/17 0956  BP: 126/82  Pulse: 83  Weight: 219 lb (99.3 kg)  Height: 5\' 3"  (1.6 m)    GEN- The patient is well appearing, alert and oriented x 3 today.   Head- normocephalic, atraumatic Eyes-  Sclera clear, conjunctiva pink Ears- hearing intact Oropharynx- clear Neck- supple, no JVP Lymph- no cervical lymphadenopathy Lungs- Clear to ausculation bilaterally, normal work of breathing Heart- irregular rate and rhythm, no murmurs, rubs or gallops, PMI not laterally displaced GI- soft, NT, ND, + BS Extremities- no clubbing, cyanosis, or edema MS- no significant deformity or atrophy Skin- no rash or lesion Psych- euthymic mood, full affect Neuro- strength and  sensation are intact  EKG- afib at  83 bpm,  qrs int 74 ms, qtc 448 ms Epic records reviewed   Assessment and Plan: 1. Paroxymal afib In afib today and pt is unaware For now continue flecainide and BB at current doses as pt is asymptomatic  She had brady on higher BB doses WIll have pt return  for ekg next week to see if persistents or paroxysmal Update echo  Continue on eliquis 5 mg bid  2. HTN Stable  3. Guillain Berre Resolved   F/u with ekg and echo in the next 1-2 weeks  Butch Penny C. Arpi Diebold, [facility] [address] [place],  [id number] [phone]

## 2017-09-08 ENCOUNTER — Ambulatory Visit (HOSPITAL_COMMUNITY)
Admission: RE | Admit: 2017-09-08 | Discharge: 2017-09-08 | Disposition: A | Discharge status: Home / Self Care | Payer: 59 | Source: Ambulatory Visit | Attending: Nurse Practitioner | Admitting: Nurse Practitioner

## 2017-09-08 ENCOUNTER — Ambulatory Visit (HOSPITAL_BASED_OUTPATIENT_CLINIC_OR_DEPARTMENT_OTHER)
Admission: RE | Admit: 2017-09-08 | Discharge: 2017-09-08 | Disposition: A | Discharge status: Home / Self Care | Payer: 59 | Source: Ambulatory Visit | Attending: Nurse Practitioner | Admitting: Nurse Practitioner

## 2017-09-08 ENCOUNTER — Encounter (HOSPITAL_COMMUNITY): Payer: Self-pay

## 2017-09-08 ENCOUNTER — Other Ambulatory Visit (INDEPENDENT_AMBULATORY_CARE_PROVIDER_SITE_OTHER): Payer: 59

## 2017-09-08 DIAGNOSIS — F419 Anxiety disorder, unspecified: Secondary | ICD-10-CM | POA: Diagnosis not present

## 2017-09-08 DIAGNOSIS — I480 Paroxysmal atrial fibrillation: Secondary | ICD-10-CM

## 2017-09-08 DIAGNOSIS — Z882 Allergy status to sulfonamides status: Secondary | ICD-10-CM | POA: Insufficient documentation

## 2017-09-08 DIAGNOSIS — E0590 Thyrotoxicosis, unspecified without thyrotoxic crisis or storm: Secondary | ICD-10-CM | POA: Insufficient documentation

## 2017-09-08 DIAGNOSIS — Z1159 Encounter for screening for other viral diseases: Secondary | ICD-10-CM

## 2017-09-08 DIAGNOSIS — I10 Essential (primary) hypertension: Secondary | ICD-10-CM | POA: Diagnosis not present

## 2017-09-08 DIAGNOSIS — Z90710 Acquired absence of both cervix and uterus: Secondary | ICD-10-CM | POA: Diagnosis not present

## 2017-09-08 DIAGNOSIS — Z79890 Hormone replacement therapy (postmenopausal): Secondary | ICD-10-CM | POA: Insufficient documentation

## 2017-09-08 DIAGNOSIS — E669 Obesity, unspecified: Secondary | ICD-10-CM | POA: Diagnosis not present

## 2017-09-08 DIAGNOSIS — E785 Hyperlipidemia, unspecified: Secondary | ICD-10-CM | POA: Insufficient documentation

## 2017-09-08 DIAGNOSIS — Z9889 Other specified postprocedural states: Secondary | ICD-10-CM | POA: Diagnosis not present

## 2017-09-08 DIAGNOSIS — Z884 Allergy status to anesthetic agent status: Secondary | ICD-10-CM | POA: Diagnosis not present

## 2017-09-08 DIAGNOSIS — Z888 Allergy status to other drugs, medicaments and biological substances status: Secondary | ICD-10-CM | POA: Diagnosis not present

## 2017-09-08 DIAGNOSIS — I481 Persistent atrial fibrillation: Secondary | ICD-10-CM | POA: Diagnosis present

## 2017-09-08 DIAGNOSIS — Z9849 Cataract extraction status, unspecified eye: Secondary | ICD-10-CM | POA: Insufficient documentation

## 2017-09-08 DIAGNOSIS — Z79899 Other long term (current) drug therapy: Secondary | ICD-10-CM | POA: Diagnosis not present

## 2017-09-08 DIAGNOSIS — Z7901 Long term (current) use of anticoagulants: Secondary | ICD-10-CM | POA: Diagnosis not present

## 2017-09-08 DIAGNOSIS — R739 Hyperglycemia, unspecified: Secondary | ICD-10-CM

## 2017-09-08 MED ORDER — FLECAINIDE ACETATE 50 MG PO TABS: 100.0000 mg | ORAL_TABLET | Freq: Two times a day (BID) | ORAL | 1 refills | Status: DC

## 2017-09-08 MED ORDER — FLECAINIDE ACETATE 100 MG PO TABS: 100.0000 mg | ORAL_TABLET | Freq: Two times a day (BID) | ORAL | 2 refills | Status: DC

## 2017-09-08 NOTE — Progress Notes (Signed)
Pt in for repeat EKG.  To be reviewed by Roderic Palau, NP

## 2017-09-08 NOTE — Addendum Note (Signed)
Addended by: Arby Barrette on: 09/08/2017 08:40 AM   Modules accepted: Orders

## 2017-09-08 NOTE — Patient Instructions (Addendum)
Cardioversion scheduled for Friday, August 9th  - Arrive at the [facility] and go to admitting at [facility] not eat or drink anything after midnight the night prior to your procedure.  - Take all your morning medication with a sip of water prior to arrival.  - You will not be able to drive home after your procedure.  Increase flecainide to 100mg  twice a day

## 2017-09-09 ENCOUNTER — Encounter: Payer: Self-pay | Admitting: Internal Medicine

## 2017-09-09 ENCOUNTER — Ambulatory Visit (INDEPENDENT_AMBULATORY_CARE_PROVIDER_SITE_OTHER): Payer: 59 | Admitting: Internal Medicine

## 2017-09-09 VITALS — BP 118/70 | HR 70 | Temp 98.1°F | Resp 18 | Ht 63.0 in | Wt 216.8 lb

## 2017-09-09 DIAGNOSIS — I480 Paroxysmal atrial fibrillation: Secondary | ICD-10-CM

## 2017-09-09 DIAGNOSIS — Z1231 Encounter for screening mammogram for malignant neoplasm of breast: Secondary | ICD-10-CM

## 2017-09-09 DIAGNOSIS — I10 Essential (primary) hypertension: Secondary | ICD-10-CM

## 2017-09-09 DIAGNOSIS — E039 Hypothyroidism, unspecified: Secondary | ICD-10-CM

## 2017-09-09 DIAGNOSIS — F419 Anxiety disorder, unspecified: Secondary | ICD-10-CM

## 2017-09-09 DIAGNOSIS — Z0000 Encounter for general adult medical examination without abnormal findings: Secondary | ICD-10-CM | POA: Diagnosis not present

## 2017-09-09 DIAGNOSIS — R739 Hyperglycemia, unspecified: Secondary | ICD-10-CM

## 2017-09-09 DIAGNOSIS — G610 Guillain-Barre syndrome: Secondary | ICD-10-CM

## 2017-09-09 DIAGNOSIS — E785 Hyperlipidemia, unspecified: Secondary | ICD-10-CM

## 2017-09-09 DIAGNOSIS — Z1239 Encounter for other screening for malignant neoplasm of breast: Secondary | ICD-10-CM

## 2017-09-09 DIAGNOSIS — E559 Vitamin D deficiency, unspecified: Secondary | ICD-10-CM

## 2017-09-09 LAB — HEPATITIS C ANTIBODY
Hepatitis C Ab: NONREACTIVE
SIGNAL TO CUT-OFF: 0.01 (ref <1.00)

## 2017-09-09 LAB — CBC WITH DIFFERENTIAL/PLATELET
BASOS PCT: 0.6 %
Basophils Absolute: 49 cells/uL (ref 0–200)
EOS ABS: 130 {cells}/uL (ref 15–500)
Eosinophils Relative: 1.6 %
HEMATOCRIT: 43.5 % (ref 35.0–45.0)
HEMOGLOBIN: 15.6 g/dL — AB (ref 11.7–15.5)
LYMPHS ABS: 3370 {cells}/uL (ref 850–3900)
MCH: 32.1 pg (ref 27.0–33.0)
MCHC: 35.9 g/dL (ref 32.0–36.0)
MCV: 89.5 fL (ref 80.0–100.0)
MONOS PCT: 9.9 %
MPV: 11.3 fL (ref 7.5–12.5)
NEUTROS ABS: 3750 {cells}/uL (ref 1500–7800)
Neutrophils Relative %: 46.3 %
Platelets: 232 10*3/uL (ref 140–400)
RBC: 4.86 10*6/uL (ref 3.80–5.10)
RDW: 12.9 % (ref 11.0–15.0)
Total Lymphocyte: 41.6 %
WBC: 8.1 10*3/uL (ref 3.8–10.8)
WBCMIX: 802 {cells}/uL (ref 200–950)

## 2017-09-09 LAB — BASIC METABOLIC PANEL
BUN: 15 mg/dL (ref 7–25)
CALCIUM: 9.5 mg/dL (ref 8.6–10.4)
CO2: 27 mmol/L (ref 20–32)
CREATININE: 0.82 mg/dL (ref 0.50–0.99)
Chloride: 106 mmol/L (ref 98–110)
Glucose, Bld: 109 mg/dL — ABNORMAL HIGH (ref 65–99)
POTASSIUM: 3.8 mmol/L (ref 3.5–5.3)
Sodium: 143 mmol/L (ref 135–146)

## 2017-09-09 LAB — HEPATIC FUNCTION PANEL
AG RATIO: 2 (calc) (ref 1.0–2.5)
ALBUMIN MSPROF: 4.3 g/dL (ref 3.6–5.1)
ALT: 11 U/L (ref 6–29)
AST: 12 U/L (ref 10–35)
Alkaline phosphatase (APISO): 68 U/L (ref 33–130)
BILIRUBIN DIRECT: 0.2 mg/dL (ref 0.0–0.2)
BILIRUBIN INDIRECT: 0.7 mg/dL (ref 0.2–1.2)
BILIRUBIN TOTAL: 0.9 mg/dL (ref 0.2–1.2)
GLOBULIN: 2.2 g/dL (ref 1.9–3.7)
Total Protein: 6.5 g/dL (ref 6.1–8.1)

## 2017-09-09 LAB — LIPID PANEL
CHOL/HDL RATIO: 4 (calc) (ref <5.0)
CHOLESTEROL: 170 mg/dL (ref <200)
HDL: 42 mg/dL — ABNORMAL LOW (ref >50)
LDL CHOLESTEROL (CALC): 100 mg/dL — AB
NON-HDL CHOLESTEROL (CALC): 128 mg/dL (ref <130)
Triglycerides: 181 mg/dL — ABNORMAL HIGH (ref <150)

## 2017-09-09 LAB — TSH: TSH: 3.32 mIU/L (ref 0.40–4.50)

## 2017-09-09 LAB — HEMOGLOBIN A1C
Hgb A1c MFr Bld: 5.6 % of total Hgb (ref <5.7)
Mean Plasma Glucose: 114 (calc)
eAG (mmol/L): 6.3 (calc)

## 2017-09-09 NOTE — Assessment & Plan Note (Signed)
Physical today 09/09/17.  Mammogram 10/16/16 - Birads I.

## 2017-09-09 NOTE — H&P (View-Only) (Signed)
Patient ID: Bonnie Shaw, female   DOB: [birth year], 68 y.o.   MRN: [id number]     Primary Care Physician: Einar Pheasant, MD Referring Physician: Dr. Donnie Mesa is a 67 y.o. female with a h/o PAF that was started on flecainide 50 mg bid by Dr. Rayann Heman latter part of June 2017.  She has not had any further afib and doing well tolerating flecainide. HR is usually in 50's at home, tolerating with minimal dizziness. She has had a sleep study and was negative. Echo showed enlargement of left atrium of 50 mm. Despite this, she has done well with minimum breakthrough afib.  She returns to afib clinic 07/29/16. She reports that she had a persistent URI and then started developing neuro symptoms of ataxia and change in sensation with touch. She was admitted to [facility], could not walk the next am and was later transferred to [facility] center for possible [place] Founds which spinal tap confirmed and pt was treat with immunoglobulin  therapy.She was sent to SNF, was recovered quickly and was d/c after 5 days. She did not have any issues with afib while there.  F/u in afib clinic 1/23, on last visit she saw Dr. Rayann Heman in December, BB was reduced for bradycardia. Since then she has noted a few episodes of afib, longest x one hour and HR was less than one hundred. She states that she lets her anxiety get the best of her and was afraid these short breakthrough episodes may damage her heart. HR is trending in the 60's with reduction of BB.   F/u in afib clinic, 7/30, pt states she feels well but she is in rate controlled afib. She is unaware. Otherwise. No issues  Pt returns to clinic 8/6 and ekg confirms that pt is still in afib. She is tolerating well. Discussed increasing flecainide to 100 mg bid and scheduling for cardioversion and she is in agreement.   Today, she denies symptoms of palpitations, chest pain, shortness of breath, orthopnea, PND, lower extremity edema, dizziness, presyncope,  syncope, or neurologic sequela. The patient is tolerating medications without difficulties and is otherwise without complaint today.   Past Medical History:  Diagnosis Date  . Anxiety   . Cataract   . Hyperlipidemia   . Hypertension   . Hyperthyroidism    s/p RAI therapy  . Obesity   . Palpitations   . Panic disorder   . Paroxysmal atrial fibrillation (HCC)   . Snoring    Past Surgical History:  Procedure Laterality Date  . ABDOMINAL HYSTERECTOMY    . CATARACT EXTRACTION    . KNEE SURGERY     left knee  . THYROID ABLATION  2010    Current Outpatient Medications  Medication Sig Dispense Refill  . atorvastatin (LIPITOR) 20 MG tablet TAKE 1 TABLET BY MOUTH  DAILY 90 tablet 1  . ELIQUIS 5 MG TABS tablet TAKE 1 TABLET BY MOUTH TWICE DAILY 180 tablet 3  . flecainide (TAMBOCOR) 100 MG tablet Take 1 tablet (100 mg total) by mouth 2 (two) times daily. 180 tablet 2  . hydrochlorothiazide (HYDRODIURIL) 25 MG tablet TAKE 1 TABLET BY MOUTH  DAILY 90 tablet 0  . levothyroxine (SYNTHROID, LEVOTHROID) 50 MCG tablet TAKE 1 TABLET BY MOUTH  DAILY 90 tablet 1  . metoprolol succinate (TOPROL XL) 25 MG 24 hr tablet Take 1 tablet (25 mg total) by mouth daily. 90 tablet 3  . potassium chloride (K-DUR,KLOR-CON) 10 MEQ tablet TAKE 1  TABLET BY MOUTH  DAILY 90 tablet 1  . sertraline (ZOLOFT) 50 MG tablet TAKE 1 TABLET BY MOUTH  EVERY DAY 90 tablet 0   No current facility-administered medications for this encounter.     Allergies  Allergen Reactions  . Ativan [Lorazepam] Other (See Comments)    High sensitivity, cause pt to pass out  . Anesthesia S-I-60 Other (See Comments)    Pt states that she woke up early during her last colonoscopy and knee surgery  . Meloxicam Other (See Comments)    Hallucinations  . Sulfonamide Derivatives Rash    Social History   Socioeconomic History  . Marital status: Married    Spouse name: Not on file  . Number of children: 4  . Years of education: Not on  file  . Highest education level: Not on file  Occupational History    Employer: UNEMPLOYED  Social Needs  . Financial resource strain: Not on file  . Food insecurity:    Worry: Not on file    Inability: Not on file  . Transportation needs:    Medical: Not on file    Non-medical: Not on file  Tobacco Use  . Smoking status: Never Smoker  . Smokeless tobacco: Never Used  Substance and Sexual Activity  . Alcohol use: No  . Drug use: No  . Sexual activity: Not on file  Lifestyle  . Physical activity:    Days per week: Not on file    Minutes per session: Not on file  . Stress: Not on file  Relationships  . Social connections:    Talks on phone: Not on file    Gets together: Not on file    Attends religious service: Not on file    Active member of club or organization: Not on file    Attends meetings of clubs or organizations: Not on file    Relationship status: Not on file  . Intimate partner violence:    Fear of current or ex partner: Not on file    Emotionally abused: Not on file    Physically abused: Not on file    Forced sexual activity: Not on file  Other Topics Concern  . 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 Alzheimer's disease Father   . Stroke Mother   . Cancer Sister        ovarian   . Ovarian cancer Sister 10  . Breast cancer Neg Hx     ROS- All systems are reviewed and negative except as per the HPI above  Physical Exam: There were no vitals filed for this visit.  GEN- The patient is well appearing, alert and oriented x 3 today.   Head- normocephalic, atraumatic Eyes-  Sclera clear, conjunctiva pink Ears- hearing intact Oropharynx- clear Neck- supple, no JVP Lymph- no cervical lymphadenopathy Lungs- Clear  to ausculation bilaterally, normal work of breathing Heart- irregular rate and rhythm, no murmurs, rubs or gallops, PMI not laterally displaced GI- soft, NT, ND, + BS Extremities- no clubbing, cyanosis, or edema MS- no significant deformity or atrophy Skin- no rash or lesion Psych- euthymic mood, full affect Neuro- strength and sensation are intact  EKG- afib at  99 bpm,  qrs int 70 ms, qtc 482 ms Epic records reviewed   Assessment and Plan: 1. Persisitentl afib Afib continues  today and pt is asymptomatic  No known trigger Increase flecainide to 100 mg bid and BB at current dose  She had brady on higher BB doses Discussed pursing cardioversion and pt is in agreement, possibly increase in flecainide may return SR as well Will update echo after return to [place], will request EKG to be done with cardioversion to further assess intervals with increase of flecainde Continue on eliquis 5 mg bid, no missed doses  2. HTN No change  F/u in afib clinic one week after cardioversion   Butch Penny C. Ved Martos, [facility] [address] [place],  [address] [phone]

## 2017-09-09 NOTE — Progress Notes (Signed)
Patient ID: Bonnie Shaw, female   DOB: [birth year], 67 y.o.   MRN: [id number]   Subjective:    Patient ID: Bonnie Shaw, female    DOB: 06/17/1950, 67 y.o.   MRN: [id number]  HPI  Patient here for her physical exam.   Has paroxysmal afib on flecainide.  Has been doing well.  Previous sleep study negative.   ECHO - enlargement of left atrium.  Was evaluated recently 09/01/17.  Was found to be in afib.  Flecainide increased to 166m bid.  Planning for cardioversion later this week.  Increased stress related to this.  Discussed with her today.  No chest pain.  She has felt well.  Exercising.  Hiking.  Breathing stable.  No acid reflux.  No abdominal pain.  Bowels moving.     Past Medical History:  Diagnosis Date  . Anxiety   . Cataract   . Complication of anesthesia   . Hyperlipidemia   . Hypertension   . Hyperthyroidism    s/p RAI therapy  . Obesity   . Palpitations   . Panic disorder   . Paroxysmal atrial fibrillation (HCC)   . Snoring    Past Surgical History:  Procedure Laterality Date  . ABDOMINAL HYSTERECTOMY    . CARDIOVERSION N/A 09/11/2017   Procedure: CARDIOVERSION;  Surgeon: RSkeet Latch MD;  [place] [facility]  Service: Cardiovascular;  Laterality: N/A;  . CATARACT EXTRACTION    . KNEE SURGERY     left knee  . THYROID ABLATION  2010   Family History  Problem Relation Age of Onset  . Alzheimer's disease Father   . Stroke Mother   . Cancer Sister        ovarian   . Ovarian cancer Sister 623 . Breast cancer Neg Hx    Social History   Socioeconomic History  . Marital status: Married    Spouse name: Not on file  . Number of children: 4  . Years of education: Not on file  . Highest education level: Not on file  Occupational History    Employer: UNEMPLOYED  Social Needs  . Financial resource strain: Not on file  . Food insecurity:    Worry: Not on file    Inability: Not on file  . Transportation needs:    Medical: Not on file    Non-medical:  Not on file  Tobacco Use  . Smoking status: Never Smoker  . Smokeless tobacco: Never Used  Substance and Sexual Activity  . Alcohol use: No  . Drug use: No  . Sexual activity: Not on file  Lifestyle  . Physical activity:    Days per week: Not on file    Minutes per session: Not on file  . Stress: Not on file  Relationships  . Social connections:    Talks on phone: Not on file    Gets together: Not on file    Attends religious service: Not on file    Active member of club or organization: Not on file    Attends meetings of clubs or organizations: Not on file    Relationship status: Not on file  Other Topics Concern  . 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 atorvastatin (LIPITOR) 20 MG tablet TAKE 1 TABLET BY MOUTH  DAILY  . ELIQUIS 5 MG TABS tablet TAKE 1 TABLET BY MOUTH TWICE DAILY  . flecainide (TAMBOCOR) 100 MG tablet Take 1 tablet (100 mg total) by mouth 2 (two) times daily.  . hydrochlorothiazide (HYDRODIURIL) 25 MG tablet TAKE 1 TABLET BY MOUTH  DAILY  . levothyroxine (SYNTHROID, LEVOTHROID) 50 MCG tablet TAKE 1 TABLET BY MOUTH  DAILY  . metoprolol succinate (TOPROL XL) 25 MG 24 hr tablet Take 1 tablet (25 mg total) by mouth daily.  . potassium chloride (K-DUR,KLOR-CON) 10 MEQ tablet TAKE 1 TABLET BY MOUTH  DAILY  . sertraline (ZOLOFT) 50 MG tablet TAKE 1 TABLET BY MOUTH  EVERY DAY   No facility-administered encounter medications on file as of 09/09/2017.     Review of Systems  Constitutional: Negative for appetite change and unexpected weight change.  HENT: Negative for congestion and sinus pressure.   Eyes: Negative for pain and visual disturbance.  Respiratory: Negative for cough, chest tightness and  shortness of breath.   Cardiovascular: Negative for chest pain, palpitations and leg swelling.  Gastrointestinal: Negative for abdominal pain, diarrhea, nausea and vomiting.  Genitourinary: Negative for difficulty urinating and dysuria.  Musculoskeletal: Negative for joint swelling and myalgias.  Skin: Negative for color change and rash.  Neurological: Negative for dizziness, light-headedness and headaches.  Hematological: Negative for adenopathy. Does not bruise/bleed easily.  Psychiatric/Behavioral: Negative for agitation and dysphoric mood.       Objective:    Physical Exam  Constitutional: She is oriented to person, place, and time. She appears well-developed and well-nourished. No distress.  HENT:  Nose: Nose normal.  Mouth/Throat: Oropharynx is clear and moist.  Eyes: Right eye exhibits no discharge. Left eye exhibits no discharge. No scleral icterus.  Neck: Neck supple. No thyromegaly present.  Cardiovascular: Normal rate and regular rhythm.  Pulmonary/Chest: Breath sounds normal. No accessory muscle usage. No tachypnea. No respiratory distress. She has no decreased breath sounds. She has no wheezes. She has no rhonchi. Right breast exhibits no inverted nipple, no mass, no nipple discharge and no tenderness (no axillary adenopathy). Left breast exhibits no inverted nipple, no mass, no nipple discharge and no tenderness (no axilarry adenopathy).  Abdominal: Soft. Bowel sounds are normal. There is no tenderness.  Musculoskeletal: She exhibits no edema or tenderness.  Lymphadenopathy:    She has no cervical adenopathy.  Neurological: She is alert and oriented to person, place, and time.  Skin: No rash noted. No erythema.  Psychiatric: She has a normal mood and affect. Her behavior is normal.    BP 118/70 (BP Location: Right Arm, Patient Position: Sitting, Cuff Size: Large)   Pulse 70   Temp 98.1 F (36.7 C) (Oral)   Resp 18   Ht 5' 3"  (1.6 m)   Wt 216 lb 12.8 oz (98.3 kg)    SpO2 97%   BMI 38.40 kg/m  Wt Readings from Last 3 Encounters:  09/09/17 216 lb 12.8 oz (98.3 kg)  09/01/17 219 lb (99.3 kg)  04/23/17 216 lb 12.8 oz (98.3 kg)     Lab Results  Component Value Date   WBC 8.1 09/08/2017   HGB 15.6 (H) 09/08/2017   HCT 43.5 09/08/2017   PLT 232 09/08/2017   GLUCOSE 109 (H) 09/08/2017   CHOL 170 09/08/2017   TRIG 181 (H) 09/08/2017   HDL 42 (L) 09/08/2017   LDLDIRECT 97.0 12/14/2015  LDLCALC 100 (H) 09/08/2017   ALT 11 09/08/2017   AST 12 09/08/2017   NA 143 09/08/2017   K 3.8 09/08/2017   CL 106 09/08/2017   CREATININE 0.82 09/08/2017   BUN 15 09/08/2017   CO2 27 09/08/2017   TSH 3.32 09/08/2017   HGBA1C 5.6 09/08/2017   MICROALBUR <0.7 06/11/2015       Assessment & Plan:   Problem List Items Addressed This Visit    Anxiety    Discussed recent afib.  On zoloft.  Overall doing relatively well.  Follow.        Atrial fibrillation ([place])    Followed by cardiology.  On flecainide.  Dose recently increased.  Planning for cardioversion this week.  Follow.        Guillain Barr syndrome [facility])    S/p treatment with IVIG.  Saw neurology.  Doing well.        Healthcare maintenance    Physical today 09/09/17.  Mammogram 10/16/16 - Birads I.        Hyperglycemia    Low carb diet and exercise.  Follow met b and a1c.       Relevant Orders   Hemoglobin A1c   Hyperlipidemia    On lipitor.  Low cholesterol diet and exercise.  Follow lipid panel and liver function tests.        Relevant Orders   Hepatic function panel   Lipid panel   Hypertension    Blood pressure under good control.  Continue same medication regimen.  Follow pressures.  Follow metabolic panel.        Relevant Orders   Basic metabolic panel   Hypothyroidism    On thyroid replacement.  Follow tsh.        Vitamin D deficiency    Follow vitamin D level.         Other Visit Diagnoses    Routine general medical examination at a health care facility    -   Primary   Breast cancer screening       Relevant Orders   MM 3D SCREEN BREAST BILATERAL       Einar Pheasant, MD

## 2017-09-09 NOTE — Progress Notes (Signed)
Patient ID: Bonnie Shaw, female   DOB: 1950-08-24, 67 y.o.   MRN: [id number]     Primary Care Physician: Einar Pheasant, MD Referring Physician: Dr. Donnie Mesa is a 67 y.o. female with a h/o PAF that was started on flecainide 50 mg bid by Dr. Rayann Heman latter part of June 2017.  She has not had any further afib and doing well tolerating flecainide. HR is usually in 50's at home, tolerating with minimal dizziness. She has had a sleep study and was negative. Echo showed enlargement of left atrium of 50 mm. Despite this, she has done well with minimum breakthrough afib.  She returns to afib clinic 07/29/16. She reports that she had a persistent URI and then started developing neuro symptoms of ataxia and change in sensation with touch. She was admitted to [facility], could not walk the next am and was later transferred to [facility] center for possible [place] Founds which spinal tap confirmed and pt was treat with immunoglobulin  therapy.She was sent to SNF, was recovered quickly and was d/c after 5 days. She did not have any issues with afib while there.  F/u in afib clinic 1/23, on last visit she saw Dr. Rayann Heman in December, BB was reduced for bradycardia. Since then she has noted a few episodes of afib, longest x one hour and HR was less than one hundred. She states that she lets her anxiety get the best of her and was afraid these short breakthrough episodes may damage her heart. HR is trending in the 60's with reduction of BB.   F/u in afib clinic, 7/30, pt states she feels well but she is in rate controlled afib. She is unaware. Otherwise. No issues  Pt returns to clinic 8/6 and ekg confirms that pt is still in afib. She is tolerating well. Discussed increasing flecainide to 100 mg bid and scheduling for cardioversion and she is in agreement.   Today, she denies symptoms of palpitations, chest pain, shortness of breath, orthopnea, PND, lower extremity edema, dizziness, presyncope,  syncope, or neurologic sequela. The patient is tolerating medications without difficulties and is otherwise without complaint today.   Past Medical History:  Diagnosis Date  . Anxiety   . Cataract   . Hyperlipidemia   . Hypertension   . Hyperthyroidism    s/p RAI therapy  . Obesity   . Palpitations   . Panic disorder   . Paroxysmal atrial fibrillation (HCC)   . Snoring    Past Surgical History:  Procedure Laterality Date  . ABDOMINAL HYSTERECTOMY    . CATARACT EXTRACTION    . KNEE SURGERY     left knee  . THYROID ABLATION  2010    Current Outpatient Medications  Medication Sig Dispense Refill  . atorvastatin (LIPITOR) 20 MG tablet TAKE 1 TABLET BY MOUTH  DAILY 90 tablet 1  . ELIQUIS 5 MG TABS tablet TAKE 1 TABLET BY MOUTH TWICE DAILY 180 tablet 3  . flecainide (TAMBOCOR) 100 MG tablet Take 1 tablet (100 mg total) by mouth 2 (two) times daily. 180 tablet 2  . hydrochlorothiazide (HYDRODIURIL) 25 MG tablet TAKE 1 TABLET BY MOUTH  DAILY 90 tablet 0  . levothyroxine (SYNTHROID, LEVOTHROID) 50 MCG tablet TAKE 1 TABLET BY MOUTH  DAILY 90 tablet 1  . metoprolol succinate (TOPROL XL) 25 MG 24 hr tablet Take 1 tablet (25 mg total) by mouth daily. 90 tablet 3  . potassium chloride (K-DUR,KLOR-CON) 10 MEQ tablet TAKE 1  TABLET BY MOUTH  DAILY 90 tablet 1  . sertraline (ZOLOFT) 50 MG tablet TAKE 1 TABLET BY MOUTH  EVERY DAY 90 tablet 0   No current facility-administered medications for this encounter.     Allergies  Allergen Reactions  . Ativan [Lorazepam] Other (See Comments)    High sensitivity, cause pt to pass out  . Anesthesia S-I-60 Other (See Comments)    Pt states that she woke up early during her last colonoscopy and knee surgery  . Meloxicam Other (See Comments)    Hallucinations  . Sulfonamide Derivatives Rash    Social History   Socioeconomic History  . Marital status: Married    Spouse name: Not on file  . Number of children: 4  . Years of education: Not on  file  . Highest education level: Not on file  Occupational History    Employer: UNEMPLOYED  Social Needs  . Financial resource strain: Not on file  . Food insecurity:    Worry: Not on file    Inability: Not on file  . Transportation needs:    Medical: Not on file    Non-medical: Not on file  Tobacco Use  . Smoking status: Never Smoker  . Smokeless tobacco: Never Used  Substance and Sexual Activity  . Alcohol use: No  . Drug use: No  . Sexual activity: Not on file  Lifestyle  . Physical activity:    Days per week: Not on file    Minutes per session: Not on file  . Stress: Not on file  Relationships  . Social connections:    Talks on phone: Not on file    Gets together: Not on file    Attends religious service: Not on file    Active member of club or organization: Not on file    Attends meetings of clubs or organizations: Not on file    Relationship status: Not on file  . Intimate partner violence:    Fear of current or ex partner: Not on file    Emotionally abused: Not on file    Physically abused: Not on file    Forced sexual activity: Not on file  Other Topics Concern  . 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 Alzheimer's disease Father   . Stroke Mother   . Cancer Sister        ovarian   . Ovarian cancer Sister 26  . Breast cancer Neg Hx     ROS- All systems are reviewed and negative except as per the HPI above  Physical Exam: There were no vitals filed for this visit.  GEN- The patient is well appearing, alert and oriented x 3 today.   Head- normocephalic, atraumatic Eyes-  Sclera clear, conjunctiva pink Ears- hearing intact Oropharynx- clear Neck- supple, no JVP Lymph- no cervical lymphadenopathy Lungs- Clear  to ausculation bilaterally, normal work of breathing Heart- irregular rate and rhythm, no murmurs, rubs or gallops, PMI not laterally displaced GI- soft, NT, ND, + BS Extremities- no clubbing, cyanosis, or edema MS- no significant deformity or atrophy Skin- no rash or lesion Psych- euthymic mood, full affect Neuro- strength and sensation are intact  EKG- afib at  99 bpm,  qrs int 70 ms, qtc 482 ms Epic records reviewed   Assessment and Plan: 1. Persisitentl afib Afib continues  today and pt is asymptomatic  No known trigger Increase flecainide to 100 mg bid and BB at current dose  She had brady on higher BB doses Discussed pursing cardioversion and pt is in agreement, possibly increase in flecainide may return SR as well Will update echo after return to [place], will request EKG to be done with cardioversion to further assess intervals with increase of flecainde Continue on eliquis 5 mg bid, no missed doses  2. HTN No change  F/u in afib clinic one week after cardioversion   Butch Penny C. Carroll, [facility] [address] [place], [place] [address] [phone]

## 2017-09-10 ENCOUNTER — Other Ambulatory Visit (HOSPITAL_COMMUNITY): Payer: 59

## 2017-09-10 ENCOUNTER — Encounter (HOSPITAL_COMMUNITY): Payer: 59 | Admitting: Nurse Practitioner

## 2017-09-11 ENCOUNTER — Ambulatory Visit (HOSPITAL_COMMUNITY)
Admission: RE | Admit: 2017-09-11 | Discharge: 2017-09-11 | Disposition: A | Discharge status: Home / Self Care | Payer: 59 | Source: Ambulatory Visit | Attending: Cardiovascular Disease | Admitting: Cardiovascular Disease

## 2017-09-11 ENCOUNTER — Ambulatory Visit (HOSPITAL_COMMUNITY): Payer: 59 | Admitting: Certified Registered"

## 2017-09-11 ENCOUNTER — Encounter (HOSPITAL_COMMUNITY)
Admission: RE | Disposition: A | Discharge status: Home / Self Care | Payer: Self-pay | Source: Ambulatory Visit | Attending: Cardiovascular Disease

## 2017-09-11 ENCOUNTER — Encounter (HOSPITAL_COMMUNITY): Payer: Self-pay | Admitting: Certified Registered"

## 2017-09-11 DIAGNOSIS — E669 Obesity, unspecified: Secondary | ICD-10-CM | POA: Insufficient documentation

## 2017-09-11 DIAGNOSIS — Z6838 Body mass index (BMI) 38.0-38.9, adult: Secondary | ICD-10-CM | POA: Insufficient documentation

## 2017-09-11 DIAGNOSIS — I10 Essential (primary) hypertension: Secondary | ICD-10-CM | POA: Insufficient documentation

## 2017-09-11 DIAGNOSIS — Z7901 Long term (current) use of anticoagulants: Secondary | ICD-10-CM | POA: Insufficient documentation

## 2017-09-11 DIAGNOSIS — Z882 Allergy status to sulfonamides status: Secondary | ICD-10-CM | POA: Insufficient documentation

## 2017-09-11 DIAGNOSIS — F410 Panic disorder [episodic paroxysmal anxiety] without agoraphobia: Secondary | ICD-10-CM | POA: Diagnosis not present

## 2017-09-11 DIAGNOSIS — E0590 Thyrotoxicosis, unspecified without thyrotoxic crisis or storm: Secondary | ICD-10-CM | POA: Diagnosis not present

## 2017-09-11 DIAGNOSIS — E785 Hyperlipidemia, unspecified: Secondary | ICD-10-CM | POA: Diagnosis not present

## 2017-09-11 DIAGNOSIS — I481 Persistent atrial fibrillation: Secondary | ICD-10-CM

## 2017-09-11 HISTORY — DX: Adverse effect of unspecified anesthetic, initial encounter: T41.45XA

## 2017-09-11 HISTORY — PX: CARDIOVERSION: SHX1299

## 2017-09-11 HISTORY — DX: Other complications of anesthesia, initial encounter: T88.59XA

## 2017-09-11 SURGERY — CARDIOVERSION
Anesthesia: General

## 2017-09-11 MED ORDER — LIDOCAINE 2% (20 MG/ML) 5 ML SYRINGE
INTRAMUSCULAR | Status: DC | PRN
  Administered 2017-09-11: 100 mg via INTRAVENOUS

## 2017-09-11 MED ORDER — SODIUM CHLORIDE 0.9 % IV SOLN
INTRAVENOUS | Status: DC
  Administered 2017-09-11: 07:00:00 via INTRAVENOUS

## 2017-09-11 MED ORDER — PROPOFOL 10 MG/ML IV BOLUS
INTRAVENOUS | Status: DC | PRN
  Administered 2017-09-11: 80 mg via INTRAVENOUS

## 2017-09-11 NOTE — Anesthesia Preprocedure Evaluation (Addendum)
Anesthesia Evaluation  Patient identified by MRN, date of birth, ID band Patient awake    Reviewed: Allergy & Precautions, NPO status , Patient's Chart, lab work & pertinent test results  Airway Mallampati: I  TM Distance: >3 FB Neck ROM: Full    Dental   Pulmonary    Pulmonary exam normal        Cardiovascular hypertension, Pt. on medications Normal cardiovascular exam     Neuro/Psych Anxiety    GI/Hepatic   Endo/Other    Renal/GU      Musculoskeletal   Abdominal   Peds  Hematology   Anesthesia Other Findings   Reproductive/Obstetrics                            Anesthesia Physical Anesthesia Plan  ASA: III  Anesthesia Plan: General   Post-op Pain Management:    Induction: Intravenous  PONV Risk Score and Plan: 3 and Treatment may vary due to age or medical condition  Airway Management Planned: Mask  Additional Equipment:   Intra-op Plan:   Post-operative Plan:   Informed Consent: I have reviewed the patients History and Physical, chart, labs and discussed the procedure including the risks, benefits and alternatives for the proposed anesthesia with the patient or authorized representative who has indicated his/her understanding and acceptance.     Plan Discussed with: CRNA and Surgeon  Anesthesia Plan Comments:         Anesthesia Quick Evaluation

## 2017-09-11 NOTE — CV Procedure (Signed)
Electrical Cardioversion Procedure Note JAMELLE NOY [id number] 1951-01-16  Procedure: Electrical Cardioversion Indications:  Atrial Fibrillation  Procedure Details Consent: Risks of procedure as well as the alternatives and risks of each were explained to the (patient/caregiver).  Consent for procedure obtained. Time Out: Verified patient identification, verified procedure, site/side was marked, verified correct patient position, special equipment/implants available, medications/allergies/relevent history reviewed, required imaging and test results available.  Performed  Patient placed on cardiac monitor, pulse oximetry, supplemental oxygen as necessary.  Sedation given: propofol 80 mg Pacer pads placed anterior and posterior chest.  Cardioverted 1 time(s).  Cardioverted at 150J.  Evaluation Findings: Post procedure EKG shows: sinus bradycardia  Complications: None Patient did tolerate procedure well.   Skeet Latch, MD 09/11/2017, 8:25 AM

## 2017-09-11 NOTE — Transfer of Care (Signed)
Immediate Anesthesia Transfer of Care Note  Patient: Bonnie Shaw  Procedure(s) Performed: CARDIOVERSION (N/A )  Patient Location: Endoscopy Unit  Anesthesia Type:General  Level of Consciousness: drowsy  Airway & Oxygen Therapy: Patient Spontanous Breathing  Post-op Assessment: Report given to RN and Post -op Vital signs reviewed and stable  Post vital signs: Reviewed and stable  Last Vitals:  Vitals Value Taken Time  BP    Temp    Pulse 49 09/11/2017  8:24 AM  Resp 16 09/11/2017  8:24 AM  SpO2 98 % 09/11/2017  8:24 AM  Vitals shown include unvalidated device data.  Last Pain:  Vitals:   09/11/17 0717  TempSrc: Oral  PainSc: 0-No pain         Complications: No apparent anesthesia complications

## 2017-09-11 NOTE — Interval H&P Note (Signed)
History and Physical Interval Note:  09/11/2017 8:07 AM  Bonnie Shaw  has presented today for surgery, with the diagnosis of AFIB  The various methods of treatment have been discussed with the patient and family. After consideration of risks, benefits and other options for treatment, the patient has consented to  Procedure(s): CARDIOVERSION (N/A) as a surgical intervention .  The patient's history has been reviewed, patient examined, no change in status, stable for surgery.  I have reviewed the patient's chart and labs.  Questions were answered to the patient's satisfaction.     Skeet Latch, MD

## 2017-09-11 NOTE — Discharge Instructions (Signed)
Do not take your metoprolol today Tomorrow, check your heart rate before taking it.  If your heart rate is less than 60, do not take metoprolol.  If your heart rate is between 60-70, take 12.5mg  of metoprolol.  If it is greater than 70 mg, take 25mg  of metoprolol.    Electrical Cardioversion, Care After This sheet gives you information about how to care for yourself after your procedure. Your health care provider may also give you more specific instructions. If you have problems or questions, contact your health care provider. What can I expect after the procedure? After the procedure, it is common to have:  Some redness on the skin where the shocks were given.  Follow these instructions at home:  Do not drive for 24 hours if you were given a medicine to help you relax (sedative).  Take over-the-counter and prescription medicines only as told by your health care provider.  Ask your health care provider how to check your pulse. Check it often.  Rest for 48 hours after the procedure or as told by your health care provider.  Avoid or limit your caffeine use as told by your health care provider. Contact a health care provider if:  You feel like your heart is beating too quickly or your pulse is not regular.  You have a serious muscle cramp that does not go away. Get help right away if:  You have discomfort in your chest.  You are dizzy or you feel faint.  You have trouble breathing or you are short of breath.  Your speech is slurred.  You have trouble moving an arm or leg on one side of your body.  Your fingers or toes turn cold or blue. This information is not intended to replace advice given to you by your health care provider. Make sure you discuss any questions you have with your health care provider. Document Released: 11/10/2012 Document Revised: 08/24/2015 Document Reviewed: 07/27/2015 Elsevier Interactive Patient Education  [employer].

## 2017-09-11 NOTE — Anesthesia Procedure Notes (Signed)
Procedure Name: General with mask airway Date/Time: 09/11/2017 8:21 AM Performed by: Imagene Riches, CRNA Pre-anesthesia Checklist: Patient identified, Emergency Drugs available, Suction available and Patient being monitored Patient Re-evaluated:Patient Re-evaluated prior to induction Oxygen Delivery Method: Ambu bag Preoxygenation: Pre-oxygenation with 100% oxygen Induction Type: IV induction

## 2017-09-11 NOTE — Anesthesia Postprocedure Evaluation (Signed)
Anesthesia Post Note  Patient: Bonnie Shaw  Procedure(s) Performed: CARDIOVERSION (N/A )     Patient location during evaluation: PACU Anesthesia Type: General Level of consciousness: awake and alert Pain management: pain level controlled Vital Signs Assessment: post-procedure vital signs reviewed and stable Respiratory status: spontaneous breathing, nonlabored ventilation, respiratory function stable and patient connected to nasal cannula oxygen Cardiovascular status: blood pressure returned to baseline and stable Postop Assessment: no apparent nausea or vomiting Anesthetic complications: no    Last Vitals:  Vitals:   09/11/17 0830 09/11/17 0840  BP: 104/62 (!) 94/57  Pulse: (!) 47 (!) 44  Resp: 19 13  Temp:    SpO2: 96% 97%    Last Pain:  Vitals:   09/11/17 0840  TempSrc:   PainSc: 0-No pain                 Tayona Sarnowski DAVID

## 2017-09-12 ENCOUNTER — Encounter: Payer: Self-pay | Admitting: Internal Medicine

## 2017-09-12 ENCOUNTER — Telehealth: Payer: Self-pay | Admitting: Physician Assistant

## 2017-09-12 NOTE — Assessment & Plan Note (Signed)
Low carb diet and exercise.  Follow met b and a1c.  

## 2017-09-12 NOTE — Assessment & Plan Note (Signed)
On thyroid replacement.  Follow tsh.  

## 2017-09-12 NOTE — Assessment & Plan Note (Signed)
Blood pressure under good control.  Continue same medication regimen.  Follow pressures.  Follow metabolic panel.   

## 2017-09-12 NOTE — Telephone Encounter (Signed)
Received page to answering service that patient has question about having more atrial fib following cardioversion. Phone rings twice and immediately goes to VM that says not available to take call. LMOM to call back. Japleen Tornow PA-C

## 2017-09-12 NOTE — Assessment & Plan Note (Signed)
Followed by cardiology.  On flecainide.  Dose recently increased.  Planning for cardioversion this week.  Follow.

## 2017-09-12 NOTE — Assessment & Plan Note (Signed)
On lipitor.  Low cholesterol diet and exercise.  Follow lipid panel and liver function tests.   

## 2017-09-12 NOTE — Assessment & Plan Note (Signed)
S/p treatment with IVIG.  Saw neurology.  Doing well.

## 2017-09-12 NOTE — Assessment & Plan Note (Signed)
Follow vitamin D level.  

## 2017-09-12 NOTE — Assessment & Plan Note (Signed)
Discussed recent afib.  On zoloft.  Overall doing relatively well.  Follow.

## 2017-09-12 NOTE — Telephone Encounter (Signed)
The patient called the answering service after-hours today. Felt well post cardioversion but this AM developed recurrent symptoms suggestive of atrial fib (HR irregularity) around 10am. This was when she was due to take her flecainide dose around that time but actually forgot until around 2pm. HR 60s but states she does not typically go fast when in AF. Compliant with [place]. Overall feeling OK -- Somewhat anxious about the whole situation, mild lightheadedness but not overwhelming. She typically has tolerated atrial fib per afib clinic notes. Unfortunately office is closed so options are limited for acute eval. Had discussed with Dr. Radford Pax who felt she may take an extra flecainide to see if that restored NSR. Since pt was playing catchup with dose and had taken it at 2pm, I advised her to give it another 1-2 hours to see if she converted to NSR before taking 1 extra tablet. She will then get back on track with usual dosing. She will also be having her son follow her pulse (he is a PA).  ER precautions reviewed. Otherwise has f/u on Monday in atrial fib clinic. Jaysean Manville PA-C

## 2017-09-14 ENCOUNTER — Ambulatory Visit (HOSPITAL_COMMUNITY)
Admission: RE | Admit: 2017-09-14 | Discharge: 2017-09-14 | Disposition: A | Discharge status: Home / Self Care | Payer: 59 | Source: Ambulatory Visit | Attending: Nurse Practitioner | Admitting: Nurse Practitioner

## 2017-09-14 ENCOUNTER — Encounter (HOSPITAL_COMMUNITY): Payer: Self-pay | Admitting: Nurse Practitioner

## 2017-09-14 VITALS — BP 138/92 | HR 88 | Ht 63.0 in | Wt 218.0 lb

## 2017-09-14 DIAGNOSIS — E669 Obesity, unspecified: Secondary | ICD-10-CM | POA: Diagnosis not present

## 2017-09-14 DIAGNOSIS — Z6838 Body mass index (BMI) 38.0-38.9, adult: Secondary | ICD-10-CM | POA: Insufficient documentation

## 2017-09-14 DIAGNOSIS — Z888 Allergy status to other drugs, medicaments and biological substances status: Secondary | ICD-10-CM | POA: Diagnosis not present

## 2017-09-14 DIAGNOSIS — Z79899 Other long term (current) drug therapy: Secondary | ICD-10-CM | POA: Diagnosis not present

## 2017-09-14 DIAGNOSIS — Z882 Allergy status to sulfonamides status: Secondary | ICD-10-CM | POA: Insufficient documentation

## 2017-09-14 DIAGNOSIS — Z7901 Long term (current) use of anticoagulants: Secondary | ICD-10-CM | POA: Diagnosis not present

## 2017-09-14 DIAGNOSIS — I480 Paroxysmal atrial fibrillation: Secondary | ICD-10-CM | POA: Insufficient documentation

## 2017-09-14 DIAGNOSIS — I10 Essential (primary) hypertension: Secondary | ICD-10-CM | POA: Insufficient documentation

## 2017-09-14 DIAGNOSIS — Z884 Allergy status to anesthetic agent status: Secondary | ICD-10-CM | POA: Diagnosis not present

## 2017-09-14 DIAGNOSIS — I481 Persistent atrial fibrillation: Secondary | ICD-10-CM | POA: Diagnosis present

## 2017-09-14 DIAGNOSIS — E785 Hyperlipidemia, unspecified: Secondary | ICD-10-CM | POA: Insufficient documentation

## 2017-09-14 DIAGNOSIS — F419 Anxiety disorder, unspecified: Secondary | ICD-10-CM | POA: Diagnosis not present

## 2017-09-14 DIAGNOSIS — I4819 Other persistent atrial fibrillation: Secondary | ICD-10-CM

## 2017-09-14 MED ORDER — METOPROLOL SUCCINATE ER 25 MG PO TB24: 25.0000 mg | ORAL_TABLET | Freq: Every day | ORAL | 3 refills | Status: DC

## 2017-09-14 NOTE — Patient Instructions (Signed)
Increase metoprolol to one tablet a day

## 2017-09-14 NOTE — Progress Notes (Signed)
Patient ID: Bonnie Shaw, female   DOB: 05/07/1950, 67 y.o.   MRN: [id number]     Primary Care Physician: Einar Pheasant, MD Referring Physician: Dr. Donnie Mesa is a 67 y.o. female with a h/o PAF that was started on flecainide 50 mg bid by Dr. Rayann Heman latter part of June 2017.  She has not had any further afib and doing well tolerating flecainide. HR is usually in 50's at home, tolerating with minimal dizziness. She has had a sleep study and was negative. Echo showed enlargement of left atrium of 50 mm. Despite this, she has done well with minimum breakthrough afib.  She returns to afib clinic 07/29/16. She reports that she had a persistent URI and then started developing neuro symptoms of ataxia and change in sensation with touch. She was admitted to [facility], could not walk the next am and was later transferred to [facility] center for possible [place] Founds which spinal tap confirmed and pt was treat with immunoglobulin  therapy.She was sent to SNF, was recovered quickly and was d/c after 5 days. She did not have any issues with afib while there.  F/u in afib clinic 1/23, on last visit she saw Dr. Rayann Heman in December, BB was reduced for bradycardia. Since then she has noted a few episodes of afib, longest x one hour and HR was less than one hundred. She states that she lets her anxiety get the best of her and was afraid these short breakthrough episodes may damage her heart. HR is trending in the 60's with reduction of BB.   F/u in afib clinic, 7/30, pt states she feels well but she is in rate controlled afib. She is unaware. Otherwise. No issues  Pt returns to clinic 8/6 and ekg confirms that pt is still in afib. She is tolerating well. Discussed increasing flecainide to 100 mg bid and scheduling for cardioversion and she is in agreement.   F/u after cardioversion, 8/12. Unfortunately, it only lasted one day. This time, she was aware that she returned to afib by the use of a Chad.  She really is not that symptomatic.  Today, she denies symptoms of palpitations, chest pain, shortness of breath, orthopnea, PND, lower extremity edema, dizziness, presyncope, syncope, or neurologic sequela. The patient is tolerating medications without difficulties and is otherwise without complaint today.   Past Medical History:  Diagnosis Date  . Anxiety   . Cataract   . Complication of anesthesia   . Hyperlipidemia   . Hypertension   . Hyperthyroidism    s/p RAI therapy  . Obesity   . Palpitations   . Panic disorder   . Paroxysmal atrial fibrillation (HCC)   . Snoring    Past Surgical History:  Procedure Laterality Date  . ABDOMINAL HYSTERECTOMY    . CARDIOVERSION N/A 09/11/2017   Procedure: CARDIOVERSION;  Surgeon: Skeet Latch, MD;  Location: [facility];  Service: Cardiovascular;  Laterality: N/A;  . CATARACT EXTRACTION    . KNEE SURGERY     left knee  . THYROID ABLATION  2010    Current Outpatient Medications  Medication Sig Dispense Refill  . atorvastatin (LIPITOR) 20 MG tablet TAKE 1 TABLET BY MOUTH  DAILY 90 tablet 1  . ELIQUIS 5 MG TABS tablet TAKE 1 TABLET BY MOUTH TWICE DAILY 180 tablet 3  . flecainide (TAMBOCOR) 100 MG tablet Take 1 tablet (100 mg total) by mouth 2 (two) times daily. 180 tablet 2  . hydrochlorothiazide (HYDRODIURIL) 25  MG tablet TAKE 1 TABLET BY MOUTH  DAILY 90 tablet 0  . levothyroxine (SYNTHROID, LEVOTHROID) 50 MCG tablet TAKE 1 TABLET BY MOUTH  DAILY 90 tablet 1  . metoprolol succinate (TOPROL XL) 25 MG 24 hr tablet Take 1 tablet (25 mg total) by mouth daily. 90 tablet 3  . potassium chloride (K-DUR,KLOR-CON) 10 MEQ tablet TAKE 1 TABLET BY MOUTH  DAILY 90 tablet 1  . sertraline (ZOLOFT) 50 MG tablet TAKE 1 TABLET BY MOUTH  EVERY DAY 90 tablet 0   No current facility-administered medications for this encounter.     Allergies  Allergen Reactions  . Ativan [Lorazepam] Other (See Comments)    High sensitivity, cause pt to pass out  .  Anesthesia S-I-60 Other (See Comments)    Pt states that she woke up early during her last colonoscopy and knee surgery  . Meloxicam Other (See Comments)    Hallucinations  . Sulfonamide Derivatives Rash    Social History   Socioeconomic History  . Marital status: Married    Spouse name: Not on file  . Number of children: 4  . Years of education: Not on file  . Highest education level: Not on file  Occupational History    Employer: UNEMPLOYED  Social Needs  . Financial resource strain: Not on file  . Food insecurity:    Worry: Not on file    Inability: Not on file  . Transportation needs:    Medical: Not on file    Non-medical: Not on file  Tobacco Use  . Smoking status: Never Smoker  . Smokeless tobacco: Never Used  Substance and Sexual Activity  . Alcohol use: No  . Drug use: No  . Sexual activity: Not on file  Lifestyle  . Physical activity:    Days per week: Not on file    Minutes per session: Not on file  . Stress: Not on file  Relationships  . Social connections:    Talks on phone: Not on file    Gets together: Not on file    Attends religious service: Not on file    Active member of club or organization: Not on file    Attends meetings of clubs or organizations: Not on file    Relationship status: Not on file  . Intimate partner violence:    Fear of current or ex partner: Not on file    Emotionally abused: Not on file    Physically abused: Not on file    Forced sexual activity: Not on file  Other Topics Concern  . 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 Alzheimer's disease Father   . Stroke Mother   . Cancer Sister        ovarian   . Ovarian cancer Sister 70  . Breast cancer Neg Hx     ROS- All systems are  reviewed and negative except as per the HPI above  Physical Exam: Vitals:   09/14/17 1005  BP: (!) 138/92  Pulse: 88  Weight: 98.9 kg  Height: 5\' 3"  (1.6 m)    GEN- The patient is well appearing, alert and oriented x 3 today.   Head-  normocephalic, atraumatic Eyes-  Sclera clear, conjunctiva pink Ears- hearing intact Oropharynx- clear Neck- supple, no JVP Lymph- no cervical lymphadenopathy Lungs- Clear to ausculation bilaterally, normal work of breathing Heart- irregular rate and rhythm, no murmurs, rubs or gallops, PMI not laterally displaced GI- soft, NT, ND, + BS Extremities- no clubbing, cyanosis, or edema MS- no significant deformity or atrophy Skin- no rash or lesion Psych- euthymic mood, full affect Neuro- strength and sensation are intact  EKG- afib at 88 bpm,  qrs int 74 ms, qtc 469 ms Epic records reviewed   Assessment and Plan: 1. Persisitent afib Successful cardioversion but ERAF Afib continues  today and pt is asymptomatic  Continue  flecainide at 100 mg bid and BB at 25 mg daily Discussed with pt change in AAT or discuss ablation, although I am concerned re size of left atrium Will update echo  with prior left atrium being at 50 mm in 2017 Continue on eliquis 5 mg bid, no missed doses  2. HTN Stable   Request requested with Dr. Lawrence Marseilles C. Sharyon Peitz, [facility] [address] [place], [place] [address] [phone]

## 2017-09-18 ENCOUNTER — Other Ambulatory Visit: Payer: Self-pay | Admitting: Internal Medicine

## 2017-09-21 ENCOUNTER — Ambulatory Visit (HOSPITAL_COMMUNITY)
Admission: RE | Admit: 2017-09-21 | Discharge: 2017-09-21 | Disposition: A | Discharge status: Home / Self Care | Payer: 59 | Source: Ambulatory Visit | Attending: Cardiology | Admitting: Cardiology

## 2017-09-21 DIAGNOSIS — I340 Nonrheumatic mitral (valve) insufficiency: Secondary | ICD-10-CM | POA: Diagnosis not present

## 2017-09-21 DIAGNOSIS — I481 Persistent atrial fibrillation: Secondary | ICD-10-CM

## 2017-09-21 DIAGNOSIS — I119 Hypertensive heart disease without heart failure: Secondary | ICD-10-CM | POA: Insufficient documentation

## 2017-09-21 DIAGNOSIS — I4819 Other persistent atrial fibrillation: Secondary | ICD-10-CM

## 2017-09-21 NOTE — Progress Notes (Signed)
  Echocardiogram 2D Echocardiogram has been performed.  Merrie Roof F 09/21/2017, 9:19 AM

## 2017-09-22 ENCOUNTER — Encounter: Payer: Self-pay | Admitting: Internal Medicine

## 2017-09-27 ENCOUNTER — Encounter (HOSPITAL_COMMUNITY): Payer: Self-pay

## 2017-09-27 ENCOUNTER — Other Ambulatory Visit: Payer: Self-pay

## 2017-09-27 ENCOUNTER — Emergency Department (HOSPITAL_COMMUNITY): Payer: 59

## 2017-09-27 ENCOUNTER — Observation Stay (HOSPITAL_COMMUNITY)
Admission: EM | Admit: 2017-09-27 | Discharge: 2017-09-28 | Disposition: A | Discharge status: Home / Self Care | Payer: 59 | Attending: Internal Medicine | Admitting: Internal Medicine

## 2017-09-27 ENCOUNTER — Telehealth: Payer: Self-pay | Admitting: Physician Assistant

## 2017-09-27 DIAGNOSIS — Z79890 Hormone replacement therapy (postmenopausal): Secondary | ICD-10-CM | POA: Diagnosis not present

## 2017-09-27 DIAGNOSIS — E039 Hypothyroidism, unspecified: Secondary | ICD-10-CM | POA: Diagnosis not present

## 2017-09-27 DIAGNOSIS — F329 Major depressive disorder, single episode, unspecified: Secondary | ICD-10-CM | POA: Diagnosis not present

## 2017-09-27 DIAGNOSIS — Z90710 Acquired absence of both cervix and uterus: Secondary | ICD-10-CM | POA: Insufficient documentation

## 2017-09-27 DIAGNOSIS — E0590 Thyrotoxicosis, unspecified without thyrotoxic crisis or storm: Secondary | ICD-10-CM | POA: Diagnosis not present

## 2017-09-27 DIAGNOSIS — I480 Paroxysmal atrial fibrillation: Secondary | ICD-10-CM | POA: Insufficient documentation

## 2017-09-27 DIAGNOSIS — Z7901 Long term (current) use of anticoagulants: Secondary | ICD-10-CM | POA: Diagnosis not present

## 2017-09-27 DIAGNOSIS — H269 Unspecified cataract: Secondary | ICD-10-CM | POA: Insufficient documentation

## 2017-09-27 DIAGNOSIS — E785 Hyperlipidemia, unspecified: Secondary | ICD-10-CM | POA: Diagnosis not present

## 2017-09-27 DIAGNOSIS — I951 Orthostatic hypotension: Secondary | ICD-10-CM | POA: Diagnosis present

## 2017-09-27 DIAGNOSIS — I10 Essential (primary) hypertension: Secondary | ICD-10-CM | POA: Diagnosis present

## 2017-09-27 DIAGNOSIS — R55 Syncope and collapse: Principal | ICD-10-CM

## 2017-09-27 DIAGNOSIS — K219 Gastro-esophageal reflux disease without esophagitis: Secondary | ICD-10-CM | POA: Diagnosis not present

## 2017-09-27 DIAGNOSIS — R739 Hyperglycemia, unspecified: Secondary | ICD-10-CM | POA: Diagnosis present

## 2017-09-27 DIAGNOSIS — Z9849 Cataract extraction status, unspecified eye: Secondary | ICD-10-CM | POA: Diagnosis not present

## 2017-09-27 DIAGNOSIS — Z884 Allergy status to anesthetic agent status: Secondary | ICD-10-CM | POA: Insufficient documentation

## 2017-09-27 DIAGNOSIS — I4891 Unspecified atrial fibrillation: Secondary | ICD-10-CM | POA: Diagnosis present

## 2017-09-27 DIAGNOSIS — Z823 Family history of stroke: Secondary | ICD-10-CM | POA: Diagnosis not present

## 2017-09-27 DIAGNOSIS — Z9889 Other specified postprocedural states: Secondary | ICD-10-CM | POA: Insufficient documentation

## 2017-09-27 DIAGNOSIS — E669 Obesity, unspecified: Secondary | ICD-10-CM | POA: Insufficient documentation

## 2017-09-27 DIAGNOSIS — Z882 Allergy status to sulfonamides status: Secondary | ICD-10-CM | POA: Insufficient documentation

## 2017-09-27 DIAGNOSIS — Z6838 Body mass index (BMI) 38.0-38.9, adult: Secondary | ICD-10-CM | POA: Insufficient documentation

## 2017-09-27 DIAGNOSIS — Z79899 Other long term (current) drug therapy: Secondary | ICD-10-CM | POA: Diagnosis not present

## 2017-09-27 DIAGNOSIS — F410 Panic disorder [episodic paroxysmal anxiety] without agoraphobia: Secondary | ICD-10-CM | POA: Diagnosis not present

## 2017-09-27 DIAGNOSIS — Z888 Allergy status to other drugs, medicaments and biological substances status: Secondary | ICD-10-CM | POA: Insufficient documentation

## 2017-09-27 DIAGNOSIS — F419 Anxiety disorder, unspecified: Secondary | ICD-10-CM | POA: Diagnosis present

## 2017-09-27 LAB — BASIC METABOLIC PANEL
Anion gap: 10 (ref 5–15)
BUN: 12 mg/dL (ref 8–23)
CALCIUM: 9.6 mg/dL (ref 8.9–10.3)
CHLORIDE: 103 mmol/L (ref 98–111)
CO2: 27 mmol/L (ref 22–32)
CREATININE: 0.9 mg/dL (ref 0.44–1.00)
Glucose, Bld: 118 mg/dL — ABNORMAL HIGH (ref 70–99)
Potassium: 4 mmol/L (ref 3.5–5.1)
SODIUM: 140 mmol/L (ref 135–145)

## 2017-09-27 LAB — I-STAT TROPONIN, ED: TROPONIN I, POC: 0.02 ng/mL (ref 0.00–0.08)

## 2017-09-27 LAB — CBC
HCT: 44.5 % (ref 36.0–46.0)
Hemoglobin: 15 g/dL (ref 12.0–15.0)
MCH: 32 pg (ref 26.0–34.0)
MCHC: 33.7 g/dL (ref 30.0–36.0)
MCV: 94.9 fL (ref 78.0–100.0)
PLATELETS: 245 10*3/uL (ref 150–400)
RBC: 4.69 MIL/uL (ref 3.87–5.11)
RDW: 12.5 % (ref 11.5–15.5)
WBC: 9.8 10*3/uL (ref 4.0–10.5)

## 2017-09-27 MED ORDER — HYDROCODONE-ACETAMINOPHEN 5-325 MG PO TABS: 1.0000 | ORAL_TABLET | ORAL | Status: DC | PRN

## 2017-09-27 MED ORDER — SERTRALINE HCL 50 MG PO TABS
50.0000 mg | ORAL_TABLET | Freq: Every day | ORAL | Status: DC
  Administered 2017-09-27: 50 mg via ORAL
  Filled 2017-09-27 (×2): qty 1

## 2017-09-27 MED ORDER — NITROGLYCERIN 0.4 MG SL SUBL: 0.4000 mg | SUBLINGUAL_TABLET | SUBLINGUAL | Status: DC | PRN

## 2017-09-27 MED ORDER — SODIUM CHLORIDE 0.9 % IV SOLN
INTRAVENOUS | Status: DC
  Administered 2017-09-27: 22:00:00 via INTRAVENOUS

## 2017-09-27 MED ORDER — FAMOTIDINE 20 MG PO TABS: 20.0000 mg | ORAL_TABLET | Freq: Every day | ORAL | Status: DC | PRN

## 2017-09-27 MED ORDER — SODIUM CHLORIDE 0.9% FLUSH
3.0000 mL | Freq: Two times a day (BID) | INTRAVENOUS | Status: DC
  Administered 2017-09-27: 3 mL via INTRAVENOUS

## 2017-09-27 MED ORDER — ATORVASTATIN CALCIUM 20 MG PO TABS
20.0000 mg | ORAL_TABLET | Freq: Every day | ORAL | Status: DC
  Administered 2017-09-27: 20 mg via ORAL
  Filled 2017-09-27 (×2): qty 1

## 2017-09-27 MED ORDER — APIXABAN 5 MG PO TABS
5.0000 mg | ORAL_TABLET | Freq: Two times a day (BID) | ORAL | Status: DC
  Administered 2017-09-27 – 2017-09-28 (×2): 5 mg via ORAL
  Filled 2017-09-27 (×2): qty 1

## 2017-09-27 MED ORDER — ASPIRIN 81 MG PO CHEW
324.0000 mg | CHEWABLE_TABLET | Freq: Once | ORAL | Status: AC
  Administered 2017-09-27: 324 mg via ORAL
  Filled 2017-09-27: qty 4

## 2017-09-27 MED ORDER — ONDANSETRON HCL 4 MG PO TABS: 4.0000 mg | ORAL_TABLET | Freq: Four times a day (QID) | ORAL | Status: DC | PRN

## 2017-09-27 MED ORDER — ACETAMINOPHEN 325 MG PO TABS: 650.0000 mg | ORAL_TABLET | Freq: Four times a day (QID) | ORAL | Status: DC | PRN

## 2017-09-27 MED ORDER — ACETAMINOPHEN 650 MG RE SUPP: 650.0000 mg | Freq: Four times a day (QID) | RECTAL | Status: DC | PRN

## 2017-09-27 MED ORDER — POLYETHYLENE GLYCOL 3350 17 G PO PACK: 17.0000 g | PACK | Freq: Every day | ORAL | Status: DC | PRN

## 2017-09-27 MED ORDER — LEVOTHYROXINE SODIUM 50 MCG PO TABS
50.0000 ug | ORAL_TABLET | Freq: Every day | ORAL | Status: DC
  Administered 2017-09-28: 50 ug via ORAL
  Filled 2017-09-27: qty 1

## 2017-09-27 MED ORDER — HYDROCHLOROTHIAZIDE 25 MG PO TABS
25.0000 mg | ORAL_TABLET | Freq: Every day | ORAL | Status: DC
  Administered 2017-09-28: 25 mg via ORAL
  Filled 2017-09-27: qty 1

## 2017-09-27 MED ORDER — POTASSIUM CHLORIDE CRYS ER 20 MEQ PO TBCR
10.0000 meq | EXTENDED_RELEASE_TABLET | Freq: Every day | ORAL | Status: DC
  Administered 2017-09-28: 10 meq via ORAL
  Filled 2017-09-27: qty 1

## 2017-09-27 MED ORDER — MORPHINE SULFATE (PF) 2 MG/ML IV SOLN: 1.0000 mg | INTRAVENOUS | Status: DC | PRN

## 2017-09-27 MED ORDER — FLECAINIDE ACETATE 100 MG PO TABS
100.0000 mg | ORAL_TABLET | Freq: Two times a day (BID) | ORAL | Status: DC
  Administered 2017-09-27 – 2017-09-28 (×2): 100 mg via ORAL
  Filled 2017-09-27 (×4): qty 1

## 2017-09-27 MED ORDER — ONDANSETRON HCL 4 MG/2ML IJ SOLN: 4.0000 mg | Freq: Four times a day (QID) | INTRAMUSCULAR | Status: DC | PRN

## 2017-09-27 NOTE — Telephone Encounter (Signed)
Patient states she is feeling very lightheaded.  Her heart rate is 45 and she states her blood pressure was 130/90.  Her husband is driving her to the hospital.  As they are already on the road, I am merely advised her to be very careful and be sure to pull over and call 911 if she feels worse.  Rosaria Ferries, PA-C 09/27/2017 2:09 PM Beeper [phone]

## 2017-09-27 NOTE — H&P (Addendum)
History and Physical    Bonnie Shaw SFK:[id number] DOB: 01/24/1951 DOA: 09/27/2017   PCP: Einar Pheasant, MD   Patient coming from:  Home    Chief Complaint: Near Syncope  HPI: Bonnie Shaw is a 67 y.o. female with medical history significant for hypertension, atrial fibrillation status post cardioversion earlier this month, which only lasted 24 hours, currently on flecainide and Eliquis., anxiety, panic disorder, prior history of orthostatic hypotension, hyperlipidemia, hyperthyroidism, who was brought to the hospital for evaluation of near syncope while at church.  Patient was sitting down, and developed lightheadedness, taking her pulse manually, which was around 45.  The patient reported that he felt as if she had a small pulsing her chest.  She never passed out.  She denies any prior symptoms similar to this 1.  She denies any shortness of breath, cough, recent illnesses, or viral infections.  She denies any vertigo, or history of stroke.  However, she reports left shoulder pain, but no frank chest pain, and that she attributes these to lifting weights this morning.  She denies any abdominal pain, nausea vomiting or diarrhea.  She denies any lower extremity swelling, or calf pain.  She denies any history of PE or DVT.  No confusion was reported.  She denies any history of seizures.  She denies any tobacco, alcohol or recreational drug use.  She is pain-free at this time.  On arrival to the ED, she was completely asymptomatic.  She is compliant with her medications, and denies any tobacco, alcohol or recreational drug use.  No recent long distance trips.  No recent surgeries. .   ED Course:  BP 111/84   Pulse 63   Temp 98.8 F (37.1 C) (Oral)   Resp 11   SpO2 94%   Glucose 118. EKG reports Atrial fibrillation Low voltage, precordial leads Borderline T abnormalities, anterior leads Prolonged QT interval, QTC 518.  Of note, this was reviewed by Dr. Jenkins Rouge, cardiology, who is  recommending that the patient be placed on a Holter monitor upon discharge.  He also reported that these could be performed as an outpatient, and the patient did not need to quite required to be in the hospital, however, this was discussed with the patient, who adamantly wants to remain overnight here, and leave with a Holter monitor.  She was explained, the cost is admission, and that is unnecessary expense, especially since this has been discussed with cardiology, but the patient wants to be admitted, and followed in here, until discharge, and then to follow-up with cardiology once going home. CBC unremarkable. CMET normal  X-ray shows cardiomegaly without failure. Procedures (including critical care time) In the ED, the patient was given aspirin 325 mg,   Review of Systems:  As per HPI otherwise all other systems reviewed and are negative  Past Medical History:  Diagnosis Date  . Anxiety   . Cataract   . Complication of anesthesia   . Hyperlipidemia   . Hypertension   . Hyperthyroidism    s/p RAI therapy  . Obesity   . Palpitations   . Panic disorder   . Paroxysmal atrial fibrillation (HCC)   . Snoring     Past Surgical History:  Procedure Laterality Date  . ABDOMINAL HYSTERECTOMY    . CARDIOVERSION N/A 09/11/2017   Procedure: CARDIOVERSION;  Surgeon: Skeet Latch, MD;  Location: [facility];  Service: Cardiovascular;  Laterality: N/A;  . CATARACT EXTRACTION    . KNEE SURGERY  left knee  . THYROID ABLATION  2010    Social History Social History   Socioeconomic History  . Marital status: Married    Spouse name: Not on file  . Number of children: 4  . Years of education: Not on file  . Highest education level: Not on file  Occupational History    Employer: UNEMPLOYED  Social Needs  . Financial resource strain: Not on file  . Food insecurity:    Worry: Not on file    Inability: Not on file  . Transportation needs:    Medical: Not on file    Non-medical:  Not on file  Tobacco Use  . Smoking status: Never Smoker  . Smokeless tobacco: Never Used  Substance and Sexual Activity  . Alcohol use: No  . Drug use: No  . Sexual activity: Not on file  Lifestyle  . Physical activity:    Days per week: Not on file    Minutes per session: Not on file  . Stress: Not on file  Relationships  . Social connections:    Talks on phone: Not on file    Gets together: Not on file    Attends religious service: Not on file    Active member of club or organization: Not on file    Attends meetings of clubs or organizations: Not on file    Relationship status: Not on file  . Intimate partner violence:    Fear of current or ex partner: Not on file    Emotionally abused: Not on file    Physically abused: Not on file    Forced sexual activity: Not on file  Other Topics Concern  . 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 Ativan [Lorazepam] Other (See Comments)    High sensitivity, cause pt to pass out  . Anesthesia S-I-60 Other (See Comments)    Pt states that she woke up early during her last colonoscopy and knee surgery  . Meloxicam Other (See Comments)    Hallucinations  . Sulfonamide Derivatives Rash    Family History  Problem Relation Age of Onset  . Alzheimer's disease Father   . Stroke Mother   . Cancer Sister        ovarian   . Ovarian cancer Sister 14  . Breast cancer Neg Hx        Prior to Admission medications   Medication Sig Start Date End Date Taking? Authorizing Provider  atorvastatin (LIPITOR) 20 MG tablet TAKE 1 TABLET BY MOUTH  DAILY 05/06/17  Yes Scott, Randell Patient, MD  ELIQUIS 5 MG TABS tablet TAKE 1 TABLET BY MOUTH TWICE DAILY 07/14/17  Yes Allred, Jeneen Rinks, MD  flecainide (TAMBOCOR) 100 MG tablet Take 1  tablet (100 mg total) by mouth 2 (two) times daily. 09/08/17  Yes Sherran Needs, NP  hydrochlorothiazide (HYDRODIURIL) 25 MG tablet TAKE 1 TABLET BY MOUTH  DAILY 09/21/17  Yes Einar Pheasant, MD  levothyroxine (SYNTHROID, LEVOTHROID) 50 MCG tablet TAKE 1 TABLET BY MOUTH  DAILY 05/06/17  Yes Einar Pheasant, MD  metoprolol succinate (TOPROL XL) 25 MG 24 hr tablet Take 1 tablet (25 mg total) by mouth daily. 09/14/17  Yes Roderic Palau  C, NP  potassium chloride (K-DUR,KLOR-CON) 10 MEQ tablet TAKE 1 TABLET BY MOUTH  DAILY 05/06/17  Yes Einar Pheasant, MD  ranitidine (ZANTAC) 150 MG tablet Take 150 mg by mouth daily as needed for heartburn.   Yes [provider]  sertraline (ZOLOFT) 50 MG tablet TAKE 1 TABLET BY MOUTH  EVERY DAY 09/21/17  Yes Einar Pheasant, MD     Physical Exam:  Vitals:   09/27/17 1445 09/27/17 1530 09/27/17 1600 09/27/17 1715  BP: 114/88 122/73 131/75 111/84  Pulse: 70 82 84 63  Resp: (!) 21 13 18 11   Temp:      TempSrc:      SpO2: 94% 98% 94% 94%   Constitutional: NAD, calm, comfortable  Eyes: PERRL, lids and conjunctivae normal ENMT: Mucous membranes are moist, without exudate or lesions  Neck: normal, supple, no masses, no thyromegaly Respiratory: clear to auscultation bilaterally, no wheezing, no crackles. Normal respiratory effort  Cardiovascular: Irregularly irregular rate and rhythm and rhythm, no  murmur, rubs or gallops. No extremity edema. 2+ pedal pulses. No carotid bruits.  Abdomen: Soft, non tender, No hepatosplenomegaly. Bowel sounds positive.  Musculoskeletal: no clubbing / cyanosis. Moves all extremities Skin: no jaundice, No lesions.  Neurologic: Sensation intact  Strength equal in all extremities Psychiatric:   Alert and oriented x 3. anxious mood.     Labs on Admission: I have personally reviewed following labs and imaging studies  CBC: Recent Labs  Lab 09/27/17 1406  WBC 9.8  HGB 15.0  HCT 44.5  MCV 94.9  PLT 245    Basic  Metabolic Panel: Recent Labs  Lab 09/27/17 1406  NA 140  K 4.0  CL 103  CO2 27  GLUCOSE 118*  BUN 12  CREATININE 0.90  CALCIUM 9.6    GFR: Estimated Creatinine Clearance: 67.1 mL/min (by C-G formula based on SCr of 0.9 mg/dL).  Liver Function Tests: No results for input(s): AST, ALT, ALKPHOS, BILITOT, PROT, ALBUMIN in the last 168 hours. No results for input(s): LIPASE, AMYLASE in the last 168 hours. No results for input(s): AMMONIA in the last 168 hours.  Coagulation Profile: No results for input(s): INR, PROTIME in the last 168 hours.  Cardiac Enzymes: No results for input(s): CKTOTAL, CKMB, CKMBINDEX, TROPONINI in the last 168 hours.  BNP (last 3 results) No results for input(s): PROBNP in the last 8760 hours.  HbA1C: No results for input(s): HGBA1C in the last 72 hours.  CBG: No results for input(s): GLUCAP in the last 168 hours.  Lipid Profile: No results for input(s): CHOL, HDL, LDLCALC, TRIG, CHOLHDL, LDLDIRECT in the last 72 hours.  Thyroid Function Tests: No results for input(s): TSH, T4TOTAL, FREET4, T3FREE, THYROIDAB in the last 72 hours.  Anemia Panel: No results for input(s): VITAMINB12, FOLATE, FERRITIN, TIBC, IRON, RETICCTPCT in the last 72 hours.  Urine analysis:    Component Value Date/Time   COLORURINE YELLOW (A) 07/09/2016 0821   APPEARANCEUR CLEAR (A) 07/09/2016 0821   LABSPEC 1.021 07/09/2016 0821   PHURINE 6.0 07/09/2016 0821   GLUCOSEU NEGATIVE 07/09/2016 0821   HGBUR NEGATIVE 07/09/2016 0821   BILIRUBINUR NEGATIVE 07/09/2016 0821   BILIRUBINUR small 08/22/2011 0953   KETONESUR 5 (A) 07/09/2016 0821   PROTEINUR NEGATIVE 07/09/2016 0821   UROBILINOGEN 0.2 08/22/2011 0953   NITRITE NEGATIVE 07/09/2016 0821   LEUKOCYTESUR NEGATIVE 07/09/2016 0821    Sepsis Labs: @LABRCNTIP (procalcitonin:4,lacticidven:4) )No results found for this or any previous visit (from the past 240 hour(s)).   Radiological Exams  on Admission: Dg Chest 2  View  Result Date: 09/27/2017 CLINICAL DATA:  Dizziness today. EXAM: CHEST - 2 VIEW COMPARISON:  Two-view chest x-ray 07/09/2016. FINDINGS: The heart is enlarged. There is no edema or effusion. No focal airspace disease is present. The visualized soft tissues and bony thorax are unremarkable. IMPRESSION: Cardiomegaly without failure. Electronically Signed   By: San Morelle M.D.   On: 09/27/2017 14:54    EKG: Independently reviewed.  Assessment/Plan Principal Problem:   Near syncope Active Problems:   Hypothyroidism   Hypertension   Atrial fibrillation (HCC)   Anxiety   Orthostatic hypotension   Hyperglycemia    Presyncopal episode.  Labs, EKG unrevealing. Neuro exam is normal [facility] syncope score places the patient in the low risk for serious outcome.  No bleeding, no abnormal vital signs, EKG and troponins are normal. Causes can be  vasovagal postural drug induced (the patient does take beta-blocker)  neurologic versus arrythmia in view of h/o Afib.Dr. Jenkins Rouge, cardiology, who is recommending that the patient be placed on a Holter monitor upon discharge.  He also reported that these could be performed as an outpatient, and the patient did not need to quite required to be in the hospital, however, this was discussed with the patient, who adamantly wants to remain overnight here, and leave with a Holter monitor.  She was explained, the cost is admission, and that is unnecessary expense, especially since this has been discussed with cardiology, but the patient wants to be admitted, and followed in here, until discharge, and then to follow-up with cardiology once going home. Syncope order set  Observation Tele bed. Check orthostatics 2 D echo if suspected structural heart disease Holter monitor upon dc  Check orthostatics  IV fluids  Hold Beta blockers   Neuro checks  Expect dc in am   Anxiety/ Depression Continue home anxiolytics Continue ZOloft   Atrial  Fibrillationstatus post cardioversion earlier this month, which only lasted 24 hours, currently on flecainide and Eliquis, flecainide  Rate controlled Continue meds  Hypothyroidism: Continue home Synthroid   Hypertension BP 121/77  Pulse 68    Controlled Add Hydralazine Q6 hours as needed for BP 160/90   Hyperlipidemia Continue home statins  GERD, no acute symptoms Continue PPI  DVT prophylaxis:  Eliquis  Code Status:    FUll  Family Communication:  Discussed with patient Disposition Plan: Expect patient to be discharged to home after condition improves Consults called:    None  Admission status:  Tele  Obs    Sharene Butters, PA-C [facility]   Amion text  [phone]   09/27/2017, 5:21 PM

## 2017-09-27 NOTE — ED Provider Notes (Signed)
[place] EMERGENCY DEPARTMENT Provider Note   CSN: [id number] Arrival date & time: 09/27/17  1344     History   Chief Complaint Chief Complaint  Patient presents with  . Dizziness    HPI Bonnie Shaw is a 67 y.o. female.  HPI  67 year old female presents with near syncope.  She has atrial fibrillation and is status post cardioversion earlier this month that only lasted about 24 hours.  She is currently on flecainide and Eliquis.  She was at church and was sitting when she all of a sudden developed lightheadedness.  Seem to come and go.  Son took her pulse manually and it was around 45.  Seemed regular.  The patient never actually passed out.  No shortness of breath or headache.  On the way here in the car she did develop some left shoulder pain but states she is had this many times before and thinks is from lifting weights this morning.  Shoulder pain is gone.  Currently feels completely asymptomatic shortly after arrival to the ED.  Past Medical History:  Diagnosis Date  . Anxiety   . Cataract   . Complication of anesthesia   . Hyperlipidemia   . Hypertension   . Hyperthyroidism    s/p RAI therapy  . Obesity   . Palpitations   . Panic disorder   . Paroxysmal atrial fibrillation (HCC)   . Snoring     Patient Active Problem List   Diagnosis Date Noted  . Hyperglycemia 04/26/2017  . Guillain Barr syndrome ([place]) 09/06/2016  . Orthostatic hypotension 07/09/2016  . Vitamin D deficiency 12/30/2015  . Healthcare maintenance 09/12/2015  . Atrial fibrillation ([place]) 12/02/2013  . Anxiety 12/02/2013  . BMI 38.0-38.9,adult 11/09/2013  . Health care maintenance 04/28/2013  . Bronchitis 02/24/2013  . Hyperlipidemia 02/23/2012  . Hypothyroidism 02/20/2011  . Hypertension 02/20/2011    Past Surgical History:  Procedure Laterality Date  . ABDOMINAL HYSTERECTOMY    . CARDIOVERSION N/A 09/11/2017   Procedure: CARDIOVERSION;  Surgeon: Skeet Latch,  MD;  Location: [facility];  Service: Cardiovascular;  Laterality: N/A;  . CATARACT EXTRACTION    . KNEE SURGERY     left knee  . THYROID ABLATION  2010     OB History   None      Home Medications    Prior to Admission medications   Medication Sig Start Date End Date Taking? Authorizing Provider  atorvastatin (LIPITOR) 20 MG tablet TAKE 1 TABLET BY MOUTH  DAILY 05/06/17  Yes Jailani Hogans, Randell Patient, MD  ELIQUIS 5 MG TABS tablet TAKE 1 TABLET BY MOUTH TWICE DAILY 07/14/17  Yes Allred, Jeneen Rinks, MD  flecainide (TAMBOCOR) 100 MG tablet Take 1 tablet (100 mg total) by mouth 2 (two) times daily. 09/08/17  Yes Sherran Needs, NP  hydrochlorothiazide (HYDRODIURIL) 25 MG tablet TAKE 1 TABLET BY MOUTH  DAILY 09/21/17  Yes Einar Pheasant, MD  levothyroxine (SYNTHROID, LEVOTHROID) 50 MCG tablet TAKE 1 TABLET BY MOUTH  DAILY 05/06/17  Yes Einar Pheasant, MD  metoprolol succinate (TOPROL XL) 25 MG 24 hr tablet Take 1 tablet (25 mg total) by mouth daily. 09/14/17  Yes Sherran Needs, NP  potassium chloride (K-DUR,KLOR-CON) 10 MEQ tablet TAKE 1 TABLET BY MOUTH  DAILY 05/06/17  Yes Einar Pheasant, MD  ranitidine (ZANTAC) 150 MG tablet Take 150 mg by mouth daily as needed for heartburn.   Yes [provider]  sertraline (ZOLOFT) 50 MG tablet TAKE 1 TABLET BY MOUTH  EVERY DAY 09/21/17  Yes Einar Pheasant, MD    Family History Family History  Problem Relation Age of Onset  . Alzheimer's disease Father   . Stroke Mother   . Cancer Sister        ovarian   . Ovarian cancer Sister 38  . Breast cancer Neg Hx     Social History Social History   Tobacco Use  . Smoking status: Never Smoker  . Smokeless tobacco: Never Used  Substance Use Topics  . Alcohol use: No  . Drug use: No     Allergies   Ativan [lorazepam]; Anesthesia s-i-60; Meloxicam; and Sulfonamide derivatives   Review of Systems Review of Systems  Constitutional: Negative for fever.  Respiratory: Negative for shortness of breath.    Cardiovascular: Negative for chest pain.  Gastrointestinal: Negative for abdominal pain and vomiting.  Musculoskeletal: Positive for arthralgias (left shoulder).  Neurological: Positive for light-headedness. Negative for syncope.  All other systems reviewed and are negative.    Physical Exam Updated Vital Signs BP 140/82 (BP Location: Left Arm)   Pulse (!) 107   Temp 98.8 F (37.1 C) (Oral)   Resp 17   SpO2 96%   Physical Exam  Constitutional: She is oriented to person, place, and time. She appears well-developed and well-nourished. No distress.  HENT:  Head: Normocephalic and atraumatic.  Right Ear: External ear normal.  Left Ear: External ear normal.  Nose: Nose normal.  Eyes: Pupils are equal, round, and reactive to light. EOM are normal. Right eye exhibits no discharge. Left eye exhibits no discharge.  Cardiovascular: Normal rate, regular rhythm and normal heart sounds.  Pulmonary/Chest: Effort normal and breath sounds normal.  Abdominal: Soft. She exhibits no distension. There is no tenderness.  Musculoskeletal:       Left shoulder: She exhibits normal range of motion and no tenderness.  Neurological: She is alert and oriented to person, place, and time.  CN 3-12 grossly intact. 5/5 strength in all 4 extremities. Grossly normal sensation. Normal finger to nose.   Skin: Skin is warm and dry. She is not diaphoretic.  Nursing note and vitals reviewed.    ED Treatments / Results  Labs (all labs ordered are listed, but only abnormal results are displayed) Labs Reviewed  BASIC METABOLIC PANEL - Abnormal; Notable for the following components:      Result Value   Glucose, Bld 118 (*)    All other components within normal limits  CBC  I-STAT TROPONIN, ED    EKG EKG Interpretation  Date/Time:  Sunday September 27 2017 14:30:16 EDT Ventricular Rate:  86 PR Interval:    QRS Duration: 96 QT Interval:  433 QTC Calculation: 518 R Axis:   37 Text Interpretation:   Atrial fibrillation Low voltage, precordial leads Borderline T abnormalities, anterior leads Prolonged QT interval Confirmed by Sherwood Gambler [phone]) on 09/27/2017 3:08:47 PM   Radiology Dg Chest 2 View  Result Date: 09/27/2017 CLINICAL DATA:  Dizziness today. EXAM: CHEST - 2 VIEW COMPARISON:  Two-view chest x-ray 07/09/2016. FINDINGS: The heart is enlarged. There is no edema or effusion. No focal airspace disease is present. The visualized soft tissues and bony thorax are unremarkable. IMPRESSION: Cardiomegaly without failure. Electronically Signed   By: San Morelle M.D.   On: 09/27/2017 14:54    Procedures Procedures (including critical care time)  Medications Ordered in ED Medications  aspirin chewable tablet 324 mg (has no administration in time range)  nitroGLYCERIN (NITROSTAT) SL tablet 0.4 mg (has  no administration in time range)     Initial Impression / Assessment and Plan / ED Course  I have reviewed the triage vital signs and the nursing notes.  Pertinent labs & imaging results that were available during my care of the patient were reviewed by me and considered in my medical decision making (see chart for details).     Patient is asymptomatic now from a near-syncope standpoint.  In rate controlled A. fib at this time. Discussed with Dr. Johnsie Cancel, who reviewed ECG and feels that likely from her going in and out of A. fib to normal sinus rhythm and has a small pause.  Will need an event monitor.  If looking okay and labs okay could go home.  Patient is very nervous about this, which is understandable given the prolonged near syncopal symptoms for about 45 minutes.  She also has this atypical chest/shoulder pain which is probably not cardiac but could use rule out. Discussed with hospitalist service, who will come see.  Final Clinical Impressions(s) / ED Diagnoses   Final diagnoses:  Near syncope    ED Discharge Orders    None       Sherwood Gambler, MD 09/27/17  1556

## 2017-09-27 NOTE — ED Triage Notes (Signed)
Onset 11am dizziness, nausea, HR low.  Pain to left shoulder thismorning, pt reports she was lifting 3 lbs weights and sometimes she gets pain in shoulder.   Denies pain at this time.   Pt has not taken any meds today.

## 2017-09-27 NOTE — ED Triage Notes (Signed)
Pt reports she feels better now. Pt reported she felt dizzy sitting in church.

## 2017-09-28 DIAGNOSIS — R55 Syncope and collapse: Secondary | ICD-10-CM

## 2017-09-28 NOTE — Discharge Summary (Signed)
Physician Discharge Summary  Bonnie Shaw ZOX:[id number] DOB: 1950-04-11 DOA: 09/27/2017  PCP: Einar Pheasant, MD  Admit date: 09/27/2017 Discharge date: 09/28/2017  Admitted From: Home Disposition: Home  Recommendations for Outpatient Follow-up:  1. Follow up with PCP in 1-2 weeks 2. Please obtain BMP/CBC in one week your next doctors visit.  3. Advised to stop her metoprolol for now as this is given her dizziness, I advised her to take the medicine if her heart rate goes over 110 and remains that way.  At that point advised her to inform her cardiologist.  Despite of this if it persistent remains elevated she needs to come to the ER for further evaluation. 4. Spoke with cards master was already forwarded message to Dr Jackalyn Lombard office to arrange for outpatient Holter monitoring.  At this point it may take 48 hours for insurance approval at that point patient will be contacted to pick up her monitor.   Discharge Condition: Stable CODE STATUS: Full Diet recommendation: Cardiac  Brief/Interim Summary: 67 year old female with history of essential hypertension, atrial fibrillation status post cardioversion only lasting for 24 hours on flecainide and Eliquis, anxiety disorder, panic disorder, hyperlipidemia, hyperthyroidism came to the hospital for evaluation of feeling dizziness and lightheadedness.  Patient states her heart rate was felt to be in 40s and felt very dizzy.  Initially when she came to the ER case was discussed by the ER provider with the on-call cardiology, Dr Johnsie Cancel who recommended discharging the patient home since her labs were relatively normal and to arrange for outpatient Holter monitor.  That evening patient did not feel safe to go home therefore she was admitted for observation.  Patient remained stable during the hospital stay and the following day I spoke with the cardiology coordinator who has sent information to her cardiologist office to arrange for outpatient Holter  monitor.  I have informed the patient that this may take 24-48 hours to have insurance approval and she can pick it up from her cardiologist office.  She will be contacted about this. Her metoprolol was held in the hospital and her heart rate remained between 70-80, in atrial fibrillation.  I also advised her to discontinue metoprolol for now and follow-up outpatient.  If her heart rate persistently stays above 110, she can go out and take her dose and notify her cardiologist.  If continues to worsen despite of this she needs to come to the ER for further evaluation.  Patient is medically stable at this time and no further inpatient work-up is planned.  She had an echocardiogram 09/21/2017 showing ejection fraction 55 to 60% with grade 2 diastolic dysfunction.   Discharge Diagnoses:  Principal Problem:   Near syncope Active Problems:   Hypothyroidism   Hypertension   Atrial fibrillation (HCC)   Anxiety   Orthostatic hypotension   Hyperglycemia  Presyncope/dizziness, resolved Symptomatic bradycardia, atrial fibrillation with slow response rate - Patient's heart rate was in 40s at the time of admission.  Case was discussed by the ER provider with the on-call cardiologist who recommended discharging her with a Holter monitor but because patient felt uncomfortable therefore she was kept in the hospital.  During the hospital stay she remained stable without any complaints.  Telemetry showed heart rate ranging from 60-80 and atrial fibrillation.  Her beta-blockers were held, will continue to hold this at the time of discharge as well..  Echocardiogram from last week was reviewed which showed ejection fraction 55 to 60% with grade 2 diastolic dysfunction.  I spoke with cardiology coordinator who has started the process for outpatient arrangement for her Holter monitor.  This may take 2-3 days.  History of anxiety/depression -Resume home medications.  Hypothyroidism -Continue home  Synthroid  Essential hypertension -Continue home meds  Hyperlipidemia -Continue statins.  Patient is already on Eliquis Husband at bedside during my evaluation this morning Stable to be discharged with outpatient follow-up.  Discharge Instructions   Allergies as of 09/28/2017      Reactions   Ativan [lorazepam] Other (See Comments)   High sensitivity, cause pt to pass out   Anesthesia S-i-60 Other (See Comments)   Pt states that she woke up early during her last colonoscopy and knee surgery   Meloxicam Other (See Comments)   Hallucinations   Sulfonamide Derivatives Rash      Medication List    STOP taking these medications   metoprolol succinate 25 MG 24 hr tablet Commonly known as:  TOPROL-XL     TAKE these medications   atorvastatin 20 MG tablet Commonly known as:  LIPITOR TAKE 1 TABLET BY MOUTH  DAILY   ELIQUIS 5 MG Tabs tablet Generic drug:  apixaban TAKE 1 TABLET BY MOUTH TWICE DAILY   flecainide 100 MG tablet Commonly known as:  TAMBOCOR Take 1 tablet (100 mg total) by mouth 2 (two) times daily.   hydrochlorothiazide 25 MG tablet Commonly known as:  HYDRODIURIL TAKE 1 TABLET BY MOUTH  DAILY   levothyroxine 50 MCG tablet Commonly known as:  SYNTHROID, LEVOTHROID TAKE 1 TABLET BY MOUTH  DAILY   potassium chloride 10 MEQ tablet Commonly known as:  K-DUR,KLOR-CON TAKE 1 TABLET BY MOUTH  DAILY   ranitidine 150 MG tablet Commonly known as:  ZANTAC Take 150 mg by mouth daily as needed for heartburn.   sertraline 50 MG tablet Commonly known as:  ZOLOFT TAKE 1 TABLET BY MOUTH  EVERY DAY      Follow-up Information    Einar Pheasant, MD. Schedule an appointment as soon as possible for a visit in 1 week(s).   Specialty:  Internal Medicine Contact information: [address] [place] [address] [phone]          Allergies  Allergen Reactions  . Ativan [Lorazepam] Other (See Comments)    High sensitivity, cause pt  to pass out  . Anesthesia S-I-60 Other (See Comments)    Pt states that she woke up early during her last colonoscopy and knee surgery  . Meloxicam Other (See Comments)    Hallucinations  . Sulfonamide Derivatives Rash    You were cared for by a hospitalist during your hospital stay. If you have any questions about your discharge medications or the care you received while you were in the hospital after you are discharged, you can call the unit and asked to speak with the hospitalist on call if the hospitalist that took care of you is not available. Once you are discharged, your primary care physician will handle any further medical issues. Please note that no refills for any discharge medications will be authorized once you are discharged, as it is imperative that you return to your primary care physician (or establish a relationship with a primary care physician if you do not have one) for your aftercare needs so that they can reassess your need for medications and monitor your lab values.  Consultations:  Cardiology curbside by the ER provider   Procedures/Studies: Dg Chest 2 View  Result Date: 09/27/2017 CLINICAL DATA:  Dizziness  today. EXAM: CHEST - 2 VIEW COMPARISON:  Two-view chest x-ray 07/09/2016. FINDINGS: The heart is enlarged. There is no edema or effusion. No focal airspace disease is present. The visualized soft tissues and bony thorax are unremarkable. IMPRESSION: Cardiomegaly without failure. Electronically Signed   By: San Morelle M.D.   On: 09/27/2017 14:54      Subjective: No acute events overnight.  This morning patient feels back to herself.  She still remains in atrial fibrillation with heart rate ranging from 70-80.  General = no fevers, chills, dizziness, malaise, fatigue HEENT/EYES = negative for pain, redness, loss of vision, double vision, blurred vision, loss of hearing, sore throat, hoarseness, dysphagia Cardiovascular= negative for chest pain,  palpitation, murmurs, lower extremity swelling Respiratory/lungs= negative for shortness of breath, cough, hemoptysis, wheezing, mucus production Gastrointestinal= negative for nausea, vomiting,, abdominal pain, melena, hematemesis Genitourinary= negative for Dysuria, Hematuria, Change in Urinary Frequency MSK = Negative for arthralgia, myalgias, Back Pain, Joint swelling  Neurology= Negative for headache, seizures, numbness, tingling  Psychiatry= Negative for anxiety, depression, suicidal and homocidal ideation Allergy/Immunology= Medication/Food allergy as listed  Skin= Negative for Rash, lesions, ulcers, itching    Discharge Exam: Vitals:   09/28/17 0500 09/28/17 1131  BP: 118/78 121/85  Pulse: 67 77  Resp: 16   Temp: 97.8 F (36.6 C) 98 F (36.7 C)  SpO2: 97% 96%   Vitals:   09/27/17 2009 09/28/17 0032 09/28/17 0500 09/28/17 1131  BP: 120/82 93/74 118/78 121/85  Pulse: 89 76 67 77  Resp:  17 16   Temp:  97.9 F (36.6 C) 97.8 F (36.6 C) 98 F (36.7 C)  TempSrc:  Oral Oral Oral  SpO2: 95% 97% 97% 96%  Weight:   98.7 kg   Height:        General: Pt is alert, awake, not in acute distress Cardiovascular: Iregularly irregular, S1/S2 +, no rubs, no gallops Respiratory: CTA bilaterally, no wheezing, no rhonchi Abdominal: Soft, NT, ND, bowel sounds + Extremities: no edema, no cyanosis    The results of significant diagnostics from this hospitalization (including imaging, microbiology, ancillary and laboratory) are listed below for reference.     Microbiology: No results found for this or any previous visit (from the past 240 hour(s)).   Labs: BNP (last 3 results) No results for input(s): BNP in the last 8760 hours. Basic Metabolic Panel: Recent Labs  Lab 09/27/17 1406  NA 140  K 4.0  CL 103  CO2 27  GLUCOSE 118*  BUN 12  CREATININE 0.90  CALCIUM 9.6   Liver Function Tests: No results for input(s): AST, ALT, ALKPHOS, BILITOT, PROT, ALBUMIN in the last  168 hours. No results for input(s): LIPASE, AMYLASE in the last 168 hours. No results for input(s): AMMONIA in the last 168 hours. CBC: Recent Labs  Lab 09/27/17 1406  WBC 9.8  HGB 15.0  HCT 44.5  MCV 94.9  PLT 245   Cardiac Enzymes: No results for input(s): CKTOTAL, CKMB, CKMBINDEX, TROPONINI in the last 168 hours. BNP: Invalid input(s): POCBNP CBG: No results for input(s): GLUCAP in the last 168 hours. D-Dimer No results for input(s): DDIMER in the last 72 hours. Hgb A1c No results for input(s): HGBA1C in the last 72 hours. Lipid Profile No results for input(s): CHOL, HDL, LDLCALC, TRIG, CHOLHDL, LDLDIRECT in the last 72 hours. Thyroid function studies No results for input(s): TSH, T4TOTAL, T3FREE, THYROIDAB in the last 72 hours.  Invalid input(s): FREET3 Anemia work up No results for input(s):  VITAMINB12, FOLATE, FERRITIN, TIBC, IRON, RETICCTPCT in the last 72 hours. Urinalysis    Component Value Date/Time   COLORURINE YELLOW (A) 07/09/2016 0821   APPEARANCEUR CLEAR (A) 07/09/2016 0821   LABSPEC 1.021 07/09/2016 0821   PHURINE 6.0 07/09/2016 0821   GLUCOSEU NEGATIVE 07/09/2016 0821   HGBUR NEGATIVE 07/09/2016 0821   BILIRUBINUR NEGATIVE 07/09/2016 0821   BILIRUBINUR small 08/22/2011 0953   KETONESUR 5 (A) 07/09/2016 0821   PROTEINUR NEGATIVE 07/09/2016 0821   UROBILINOGEN 0.2 08/22/2011 0953   NITRITE NEGATIVE 07/09/2016 0821   LEUKOCYTESUR NEGATIVE 07/09/2016 0821   Sepsis Labs Invalid input(s): PROCALCITONIN,  WBC,  LACTICIDVEN Microbiology No results found for this or any previous visit (from the past 240 hour(s)).   Time coordinating discharge:  I have spent 35 minutes face to face with the patient and on the ward discussing the patients care, assessment, plan and disposition with other care givers. >50% of the time was devoted counseling the patient about the risks and benefits of treatment/Discharge disposition and coordinating care.    SIGNED:   Damita Lack, MD  [facility] 09/28/2017, 1:08 PM Pager   If 7PM-7AM, please contact night-coverage [email or web address] Password [email or web address]

## 2017-09-28 NOTE — Progress Notes (Signed)
Discharge instruction given to patient and her husband. All question answered.

## 2017-09-29 ENCOUNTER — Ambulatory Visit (HOSPITAL_COMMUNITY): Payer: 59 | Admitting: Nurse Practitioner

## 2017-09-29 ENCOUNTER — Telehealth: Payer: Self-pay | Admitting: Internal Medicine

## 2017-09-29 NOTE — Telephone Encounter (Signed)
Returned call to Pt.  Per review of hospitalization, had recommended monitor but no order entered.  Spoke with APP-advised to have Pt see Butch Penny asap to determine if Pt needs monitor and what type.  Pt to see Butch Penny tomorrow at the [facility].  Pt indicates understanding.  Thanked nurse for call back.

## 2017-09-29 NOTE — Telephone Encounter (Signed)
New Message:       Pt states she spoke with someone about coming to pick up a monitor today.

## 2017-09-30 ENCOUNTER — Ambulatory Visit (HOSPITAL_COMMUNITY)
Admission: RE | Admit: 2017-09-30 | Discharge: 2017-09-30 | Disposition: A | Discharge status: Home / Self Care | Payer: 59 | Source: Ambulatory Visit | Attending: Nurse Practitioner | Admitting: Nurse Practitioner

## 2017-09-30 ENCOUNTER — Telehealth: Payer: Self-pay | Admitting: Internal Medicine

## 2017-09-30 ENCOUNTER — Encounter (HOSPITAL_COMMUNITY): Payer: Self-pay | Admitting: Nurse Practitioner

## 2017-09-30 VITALS — BP 146/84 | HR 102 | Ht 63.0 in | Wt 219.0 lb

## 2017-09-30 DIAGNOSIS — E785 Hyperlipidemia, unspecified: Secondary | ICD-10-CM | POA: Diagnosis not present

## 2017-09-30 DIAGNOSIS — Z79890 Hormone replacement therapy (postmenopausal): Secondary | ICD-10-CM | POA: Diagnosis not present

## 2017-09-30 DIAGNOSIS — E0590 Thyrotoxicosis, unspecified without thyrotoxic crisis or storm: Secondary | ICD-10-CM | POA: Insufficient documentation

## 2017-09-30 DIAGNOSIS — F419 Anxiety disorder, unspecified: Secondary | ICD-10-CM | POA: Insufficient documentation

## 2017-09-30 DIAGNOSIS — I10 Essential (primary) hypertension: Secondary | ICD-10-CM | POA: Insufficient documentation

## 2017-09-30 DIAGNOSIS — R9431 Abnormal electrocardiogram [ECG] [EKG]: Secondary | ICD-10-CM | POA: Diagnosis not present

## 2017-09-30 DIAGNOSIS — I480 Paroxysmal atrial fibrillation: Secondary | ICD-10-CM | POA: Diagnosis not present

## 2017-09-30 DIAGNOSIS — Z8041 Family history of malignant neoplasm of ovary: Secondary | ICD-10-CM | POA: Diagnosis not present

## 2017-09-30 DIAGNOSIS — I4819 Other persistent atrial fibrillation: Secondary | ICD-10-CM

## 2017-09-30 DIAGNOSIS — Z7901 Long term (current) use of anticoagulants: Secondary | ICD-10-CM | POA: Insufficient documentation

## 2017-09-30 DIAGNOSIS — Z888 Allergy status to other drugs, medicaments and biological substances status: Secondary | ICD-10-CM | POA: Insufficient documentation

## 2017-09-30 DIAGNOSIS — Z9889 Other specified postprocedural states: Secondary | ICD-10-CM | POA: Insufficient documentation

## 2017-09-30 DIAGNOSIS — Z79899 Other long term (current) drug therapy: Secondary | ICD-10-CM | POA: Insufficient documentation

## 2017-09-30 DIAGNOSIS — Z882 Allergy status to sulfonamides status: Secondary | ICD-10-CM | POA: Insufficient documentation

## 2017-09-30 DIAGNOSIS — I481 Persistent atrial fibrillation: Secondary | ICD-10-CM | POA: Diagnosis present

## 2017-09-30 DIAGNOSIS — Z884 Allergy status to anesthetic agent status: Secondary | ICD-10-CM | POA: Diagnosis not present

## 2017-09-30 DIAGNOSIS — Z823 Family history of stroke: Secondary | ICD-10-CM | POA: Insufficient documentation

## 2017-09-30 DIAGNOSIS — Z818 Family history of other mental and behavioral disorders: Secondary | ICD-10-CM | POA: Diagnosis not present

## 2017-09-30 NOTE — Progress Notes (Signed)
Patient ID: Bonnie Shaw, female   DOB: [birth year], 67 y.o.   MRN: [id number]     Primary Care Physician: Einar Pheasant, MD Referring Physician: Dr. Donnie Mesa is a 67 y.o. female with a h/o PAF that was started on flecainide 50 mg bid by Dr. Rayann Heman latter part of June 2017.  She has not had any further afib and doing well tolerating flecainide. HR is usually in 50's at home, tolerating with minimal dizziness. She has had a sleep study and was negative. Echo showed enlargement of left atrium of 50 mm. Despite this, she has done well with minimum breakthrough afib.  She returns to afib clinic 07/29/16. She reports that she had a persistent URI and then started developing neuro symptoms of ataxia and change in sensation with touch. She was admitted to [facility], could not walk the next am and was later transferred to [facility] center for possible [place] which spinal tap confirmed and pt was treat with immunoglobulin  therapy.She was sent to SNF, rehabilitated  quickly and was d/c after 5 days. She did not have any issues with afib while there.  F/u in afib clinic 02/25/17, on last visit she saw Dr. Rayann Heman in December, BB was reduced for bradycardia. Since then she has noted a few episodes of afib, longest x one hour and HR was less than one hundred. She states that she lets her anxiety get the best of her and was afraid these short breakthrough episodes may damage her heart. HR is trending in the 60's with reduction of BB.   F/u in afib clinic, 09/01/17 pt states she feels well but she is in rate controlled afib. She is unaware. Otherwise. No issues  Pt returns to clinic 09/08/17 and ekg confirms that pt is still in afib. She is tolerating well. Discussed increasing flecainide to 100 mg bid and scheduling for cardioversion and she is in agreement.   F/u after cardioversion, 8/12. Unfortunately, it only lasted one day. This time, she was aware that she returned to afib by the use of a  [place]. She really is not that symptomatic. It was discussed at visit to see Dr. Rayann Heman to see if she would be an candidate for  ablation vrs change in antiarrythmic or living in afib as she states that she feels well. I worry about her left atrium size of 56 mm to maintain SR.   F/u 8/28, in afib clinic, for ER visit. She was in church this past Sunday and developed dizziness/pre syncope.  Her son in law who is a PA checked her pulse and it was 45 and regular. She wanted to come to the ER and be evaluated. She was asymptomatic and in rate controlled afib on arrival. Labs were unremarkable. Cardiology was consulted and she was thought to be ok to go home.She however, with her anxiety, did not want to go home and was kept overnight at her insistence/AMA. She was told that she would need a monitor as an outpatient. Metoprolol was stopped. In the office, she is rate controlled but has been super anxious ever since she was told she was in afib.  No further spells.   Today, she denies symptoms of palpitations, chest pain, shortness of breath, orthopnea, PND, lower extremity edema, dizziness, presyncope, syncope, or neurologic sequela. The patient is tolerating medications without difficulties and is otherwise without complaint today.   Past Medical History:  Diagnosis Date  . Anxiety   . Cataract   .  Complication of anesthesia   . Hyperlipidemia   . Hypertension   . Hyperthyroidism    s/p RAI therapy  . Obesity   . Palpitations   . Panic disorder   . Paroxysmal atrial fibrillation (HCC)   . Snoring    Past Surgical History:  Procedure Laterality Date  . ABDOMINAL HYSTERECTOMY    . CARDIOVERSION N/A 09/11/2017   Procedure: CARDIOVERSION;  Surgeon: Skeet Latch, MD;  Location: [facility];  Service: Cardiovascular;  Laterality: N/A;  . CATARACT EXTRACTION    . KNEE SURGERY     left knee  . THYROID ABLATION  2010    Current Outpatient Medications  Medication Sig Dispense Refill  .  atorvastatin (LIPITOR) 20 MG tablet TAKE 1 TABLET BY MOUTH  DAILY 90 tablet 1  . ELIQUIS 5 MG TABS tablet TAKE 1 TABLET BY MOUTH TWICE DAILY 180 tablet 3  . hydrochlorothiazide (HYDRODIURIL) 25 MG tablet TAKE 1 TABLET BY MOUTH  DAILY 90 tablet 0  . levothyroxine (SYNTHROID, LEVOTHROID) 50 MCG tablet TAKE 1 TABLET BY MOUTH  DAILY 90 tablet 1  . potassium chloride (K-DUR,KLOR-CON) 10 MEQ tablet TAKE 1 TABLET BY MOUTH  DAILY 90 tablet 1  . sertraline (ZOLOFT) 50 MG tablet TAKE 1 TABLET BY MOUTH  EVERY DAY 90 tablet 0  . ranitidine (ZANTAC) 150 MG tablet Take 150 mg by mouth daily as needed for heartburn.     No current facility-administered medications for this encounter.     Allergies  Allergen Reactions  . Ativan [Lorazepam] Other (See Comments)    High sensitivity, cause pt to pass out  . Anesthesia S-I-60 Other (See Comments)    Pt states that she woke up early during her last colonoscopy and knee surgery  . Meloxicam Other (See Comments)    Hallucinations  . Sulfonamide Derivatives Rash    Social History   Socioeconomic History  . Marital status: Married    Spouse name: Not on file  . Number of children: 4  . Years of education: Not on file  . Highest education level: Not on file  Occupational History    Employer: UNEMPLOYED  Social Needs  . Financial resource strain: Not on file  . Food insecurity:    Worry: Not on file    Inability: Not on file  . Transportation needs:    Medical: Not on file    Non-medical: Not on file  Tobacco Use  . Smoking status: Never Smoker  . Smokeless tobacco: Never Used  Substance and Sexual Activity  . Alcohol use: No  . Drug use: No  . Sexual activity: Not on file  Lifestyle  . Physical activity:    Days per week: Not on file    Minutes per session: Not on file  . Stress: Not on file  Relationships  . Social connections:    Talks on phone: Not on file    Gets together: Not on file    Attends religious service: Not on file     Active member of club or organization: Not on file    Attends meetings of clubs or organizations: Not on file    Relationship status: Not on file  . Intimate partner violence:    Fear of current or ex partner: Not on file    Emotionally abused: Not on file    Physically abused: Not on file    Forced sexual activity: Not on file  Other Topics Concern  . 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 Alzheimer's disease Father   . Stroke Mother   . Cancer Sister        ovarian   . Ovarian cancer Sister 45  . Breast cancer Neg Hx     ROS- All systems are reviewed and negative except as per the HPI above  Physical Exam: Vitals:   09/30/17 1504  BP: (!) 146/84  Pulse: (!) 102  Weight: 99.3 kg  Height: 5\' 3"  (1.6 m)    GEN- The patient is well appearing, alert and oriented x 3 today.   Head- normocephalic, atraumatic Eyes-  Sclera clear, conjunctiva pink Ears- hearing intact Oropharynx- clear Neck- supple, no JVP Lymph- no cervical lymphadenopathy Lungs- Clear to ausculation bilaterally, normal work of breathing Heart- irregular rate and rhythm, no murmurs, rubs or gallops, PMI not laterally displaced GI- soft, NT, ND, + BS Extremities- no clubbing, cyanosis, or edema MS- no significant deformity or atrophy Skin- no rash or lesion Psych- euthymic mood, full affect Neuro- strength and sensation are intact  EKG- afib at 102 bpm,  qrs int 74 ms, qtc 492 ms Epic records reviewed Echo -8/19-Study Conclusions  - Left ventricle: The cavity size was normal. There was mild focal   basal hypertrophy of the septum. Systolic function was normal.   The estimated ejection fraction was in the range of 55% to 60%.   Wall motion was normal; there  were no regional wall motion   abnormalities. Features are consistent with a pseudonormal left   ventricular filling pattern, with concomitant abnormal relaxation   and increased filling pressure (grade 2 diastolic dysfunction). - Mitral valve: Calcified annulus. Mildly thickened leaflets .   There was mild regurgitation. - Left atrium: The atrium was severely dilated. Volume/bsa, S: 59.6   ml/m^2. - Tricuspid valve: There was trivial regurgitation.  Assessment and Plan: 1. Persisitent afib Successful cardioversion but ERAF Afib was asymptomatic with afib, did have a presyncopal spell(? Converted briefly to SR/termination pause) BB was stopped in the hospital Will stop flecainide today Discussed with pt change in AAT or discussed ablation, although I am concerned re size of left atrium for success of procedure Echo updated and still has severely dilated left atrium at 56 mm She really does not feel bad in afib/ rate control may be an option, but she has been super anxious about being in afib, although I have tried to reassure her  Monitors discussed, she is afraid having a 30 day event monitor  will make her feel even  more anxious,will place one week ZIO patch and can f/u with results of this when she sees Dr. Rayann Heman  9/11 Continue on eliquis 5 mg bid, no missed doses  2. HTN Stable   F/U Dr. Rayann Heman 9/11   [name]. Gavin Faivre, [facility] [address] [place], [place] [address] [phone]

## 2017-09-30 NOTE — Telephone Encounter (Signed)
First attempt made for TCM call no left message to call office.

## 2017-10-01 ENCOUNTER — Telehealth: Payer: Self-pay

## 2017-10-01 NOTE — Telephone Encounter (Signed)
See phone note dated 8/29/189 was able to reach patient to day.

## 2017-10-01 NOTE — Telephone Encounter (Signed)
Copied from [place] [id number]. Topic: General - Other >> Sep 30, 2017  5:14 PM Keene Breath wrote: Reason for CRM: Patient is returning call from earlier today.  Please call patient back at [phone].

## 2017-10-01 NOTE — Telephone Encounter (Signed)
Transition Care Management Follow-up Telephone Call  How have you been since you were released from the hospital? Patient feels better coming off the Metoprolol and say pulse is back in the 60's now. Patient has a halter monitor on for next 2 weeks and has appointment set for 2 weeks with Dr. Fletcher Anon.   Do you understand why you were in the hospital? yes   Do you understand the discharge instrcutions? yes  Items Reviewed:  Medications reviewed: yes  Allergies reviewed: yes  Dietary changes reviewed: yes  Referrals reviewed: yes   Functional Questionnaire:   Activities of Daily Living (ADLs):   She states they are independent in the following: ambulation, bathing and hygiene, feeding, continence, grooming, toileting and dressing States they require assistance with the following: No assistance neededat thei time.   Any transportation issues/concerns?: no   Any patient concerns? no   Confirmed importance and date/time of follow-up visits scheduled: yes   Confirmed with patient if condition begins to worsen call PCP or go to the ER.  Patient was given the Call-a-Nurse line [phone]: yes

## 2017-10-08 ENCOUNTER — Encounter: Payer: Self-pay | Admitting: Internal Medicine

## 2017-10-08 ENCOUNTER — Ambulatory Visit (INDEPENDENT_AMBULATORY_CARE_PROVIDER_SITE_OTHER): Payer: 59 | Admitting: Internal Medicine

## 2017-10-08 VITALS — BP 126/74 | HR 65 | Temp 98.5°F | Resp 18 | Wt 216.8 lb

## 2017-10-08 DIAGNOSIS — I481 Persistent atrial fibrillation: Secondary | ICD-10-CM

## 2017-10-08 DIAGNOSIS — I4819 Other persistent atrial fibrillation: Secondary | ICD-10-CM

## 2017-10-08 DIAGNOSIS — F419 Anxiety disorder, unspecified: Secondary | ICD-10-CM

## 2017-10-08 DIAGNOSIS — I10 Essential (primary) hypertension: Secondary | ICD-10-CM

## 2017-10-08 DIAGNOSIS — R739 Hyperglycemia, unspecified: Secondary | ICD-10-CM | POA: Diagnosis not present

## 2017-10-08 DIAGNOSIS — Z23 Encounter for immunization: Secondary | ICD-10-CM

## 2017-10-08 DIAGNOSIS — G610 Guillain-Barre syndrome: Secondary | ICD-10-CM

## 2017-10-08 LAB — BASIC METABOLIC PANEL
BUN: 14 mg/dL (ref 6–23)
CO2: 31 mEq/L (ref 19–32)
CREATININE: 0.79 mg/dL (ref 0.40–1.20)
Calcium: 9.7 mg/dL (ref 8.4–10.5)
Chloride: 105 mEq/L (ref 96–112)
GFR: 76.91 mL/min (ref >60.00)
GLUCOSE: 107 mg/dL — AB (ref 70–99)
POTASSIUM: 3.8 meq/L (ref 3.5–5.1)
Sodium: 142 mEq/L (ref 135–145)

## 2017-10-08 LAB — CBC WITH DIFFERENTIAL/PLATELET
BASOS PCT: 1.3 % (ref 0.0–3.0)
Basophils Absolute: 0.1 10*3/uL (ref 0.0–0.1)
EOS ABS: 0.1 10*3/uL (ref 0.0–0.7)
Eosinophils Relative: 1.1 % (ref 0.0–5.0)
HEMATOCRIT: 43.3 % (ref 36.0–46.0)
HEMOGLOBIN: 15 g/dL (ref 12.0–15.0)
LYMPHS PCT: 37.1 % (ref 12.0–46.0)
Lymphs Abs: 2.8 10*3/uL (ref 0.7–4.0)
MCHC: 34.5 g/dL (ref 30.0–36.0)
MCV: 91.4 fl (ref 78.0–100.0)
Monocytes Absolute: 0.7 10*3/uL (ref 0.1–1.0)
Monocytes Relative: 8.8 % (ref 3.0–12.0)
NEUTROS ABS: 3.9 10*3/uL (ref 1.4–7.7)
Neutrophils Relative %: 51.7 % (ref 43.0–77.0)
PLATELETS: 223 10*3/uL (ref 150.0–400.0)
RBC: 4.74 Mil/uL (ref 3.87–5.11)
RDW: 13.4 % (ref 11.5–15.5)
WBC: 7.5 10*3/uL (ref 4.0–10.5)

## 2017-10-08 NOTE — Progress Notes (Signed)
Patient ID: Bonnie Shaw, female   DOB: 25-May-1950, 67 y.o.   MRN: [id number]   Subjective:    Patient ID: Bonnie Shaw, female    DOB: 22-Mar-1950, 67 y.o.   MRN: [id number]  HPI  Patient here for hospital follow up.  She was recently admitted after presenting with dizziness and lightheadedness.  At home, when symptoms started, heart rate was noted to be in the 40s.  Labs normal.  Admitted for observation.  She remained asymptomatic and was discharged home to wear holter monitor as outpatient - with plan to f/u with cardiology.  Pt wore the monitor and returned it yesterday.  Metoprolol was held in the hospital and she remains off.  Since her discharge, she has done well.  No syncope or near syncopal episode.  No chest pain.  No sob.  Overall feels good.  Some anxiety. She feels she is handling things better.  Had questions about her echo and cxr. Questions answered.  Overall she feels things are stable.  Eating.  No nausea or vomiting.  No abdominal pain.  Bowels moving.     Past Medical History:  Diagnosis Date  . Anxiety   . Cataract   . Complication of anesthesia   . Hyperlipidemia   . Hypertension   . Hyperthyroidism    s/p RAI therapy  . Obesity   . Palpitations   . Panic disorder   . Paroxysmal atrial fibrillation (HCC)   . Snoring    Past Surgical History:  Procedure Laterality Date  . ABDOMINAL HYSTERECTOMY    . CARDIOVERSION N/A 09/11/2017   Procedure: CARDIOVERSION;  Surgeon: Skeet Latch, MD;  Location: [place];  Service: Cardiovascular;  Laterality: N/A;  . CATARACT EXTRACTION    . KNEE SURGERY     left knee  . THYROID ABLATION  2010   Family History  Problem Relation Age of Onset  . Alzheimer's disease Father   . Stroke Mother   . Cancer Sister        ovarian   . Ovarian cancer Sister 40  . Breast cancer Neg Hx    Social History   Socioeconomic History  . Marital status: Married    Spouse name: Not on file  . Number of children: 4  . Years of  education: Not on file  . Highest education level: Not on file  Occupational History    Employer: UNEMPLOYED  Social Needs  . Financial resource strain: Not on file  . Food insecurity:    Worry: Not on file    Inability: Not on file  . Transportation needs:    Medical: Not on file    Non-medical: Not on file  Tobacco Use  . Smoking status: Never Smoker  . Smokeless tobacco: Never Used  Substance and Sexual Activity  . Alcohol use: No  . Drug use: No  . Sexual activity: Not on file  Lifestyle  . Physical activity:    Days per week: Not on file    Minutes per session: Not on file  . Stress: Not on file  Relationships  . Social connections:    Talks on phone: Not on file    Gets together: Not on file    Attends religious service: Not on file    Active member of club or organization: Not on file    Attends meetings of clubs or organizations: Not on file    Relationship status: Not on file  Other Topics Concern  . 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 atorvastatin (LIPITOR) 20 MG tablet TAKE 1 TABLET BY MOUTH  DAILY  . ELIQUIS 5 MG TABS tablet TAKE 1 TABLET BY MOUTH TWICE DAILY  . hydrochlorothiazide (HYDRODIURIL) 25 MG tablet TAKE 1 TABLET BY MOUTH  DAILY  . levothyroxine (SYNTHROID, LEVOTHROID) 50 MCG tablet TAKE 1 TABLET BY MOUTH  DAILY  . potassium chloride (K-DUR,KLOR-CON) 10 MEQ tablet TAKE 1 TABLET BY MOUTH  DAILY  . ranitidine (ZANTAC) 150 MG tablet Take 150 mg by mouth daily as needed for heartburn.  . sertraline (ZOLOFT) 50 MG tablet TAKE 1 TABLET BY MOUTH  EVERY DAY   No facility-administered encounter medications on file as of 10/08/2017.     Review of Systems  Constitutional: Negative for appetite  change and unexpected weight change.  HENT: Negative for congestion and sinus pressure.   Respiratory: Negative for cough, chest tightness and shortness of breath.   Cardiovascular: Negative for chest pain, palpitations and leg swelling.  Gastrointestinal: Negative for abdominal pain, diarrhea, nausea and vomiting.  Genitourinary: Negative for difficulty urinating and dysuria.  Musculoskeletal: Negative for joint swelling and myalgias.  Skin: Negative for color change and rash.  Neurological: Negative for dizziness, light-headedness and headaches.  Psychiatric/Behavioral: Negative for agitation and dysphoric mood.       Objective:    Physical Exam  Constitutional: She appears well-developed and well-nourished. No distress.  HENT:  Nose: Nose normal.  Mouth/Throat: Oropharynx is clear and moist.  Neck: Neck supple. No thyromegaly present.  Cardiovascular: Normal rate and regular rhythm.  Pulmonary/Chest: Breath sounds normal. No respiratory distress. She has no wheezes.  Abdominal: Soft. Bowel sounds are normal. There is no tenderness.  Musculoskeletal: She exhibits no edema or tenderness.  Lymphadenopathy:    She has no cervical adenopathy.  Skin: No rash noted. No erythema.  Psychiatric: She has a normal mood and affect. Her behavior is normal.    BP 126/74 (BP Location: Left Arm, Patient Position: Sitting, Cuff Size: Normal)   Pulse 65   Temp 98.5 F (36.9 C) (Oral)   Resp 18   Wt 216 lb 12.8 oz (98.3 kg)   SpO2 98%   BMI 38.40 kg/m  Wt Readings from Last 3 Encounters:  10/08/17 216 lb 12.8 oz (98.3 kg)  09/30/17 219 lb (99.3 kg)  09/28/17 217 lb 8 oz (98.7 kg)     Lab Results  Component Value Date   WBC 7.5 10/08/2017   HGB 15.0 10/08/2017   HCT 43.3 10/08/2017   PLT 223.0 10/08/2017   GLUCOSE 107 (H) 10/08/2017   CHOL 170 09/08/2017   TRIG 181 (H) 09/08/2017   HDL 42 (L) 09/08/2017   LDLDIRECT 97.0 12/14/2015   LDLCALC 100 (H) 09/08/2017   ALT 11  09/08/2017   AST 12 09/08/2017   NA 142 10/08/2017   K 3.8 10/08/2017   CL 105 10/08/2017   CREATININE 0.79 10/08/2017   BUN 14 10/08/2017   CO2 31 10/08/2017   TSH 3.32 09/08/2017   HGBA1C 5.6 09/08/2017   MICROALBUR <0.7 06/11/2015       Assessment & Plan:   Problem List Items Addressed This Visit  Anxiety    Discussed with her today.  On zoloft.  Doing better.  Follow.        Atrial fibrillation ([place])    Followed by cardiology.  Off flecainide and metoprolol.  Doing well.  Just returned holter yesterday.  Keep f/u with cardiology.       Relevant Orders   CBC with Differential/Platelet (Completed)   Guillain Barr syndrome ([place])    S/p treatment with IVIG.  Has been released by neurology.  Doing well.  Discussed flu vaccine.  She received high dose flu vaccine last year - after her diagnosis and treatment of GBS.  See note from her neurologist.  They had discussed with two other neuromuscular physicians and determined risk of inluenza illness outweighed potentil risk of the flu vaccine.  Discussed at length with her today.  We discussed potential risk.  She wanted to receive ful vaccine.  Given.        Hyperglycemia - Primary    Low carb diet and exercise.  Follow met b and a1c.        Hypertension    Blood pressure doing well.  Continue current medication regimen.  Off metoprolol.  Follow.        Relevant Orders   Basic metabolic panel (Completed)    Other Visit Diagnoses    Encounter for immunization       Relevant Orders   Flu vaccine HIGH DOSE PF (Completed)       Einar Pheasant, MD

## 2017-10-09 ENCOUNTER — Other Ambulatory Visit: Payer: Self-pay | Admitting: Internal Medicine

## 2017-10-09 ENCOUNTER — Encounter: Payer: Self-pay | Admitting: Internal Medicine

## 2017-10-09 IMAGING — MG MM DIGITAL SCREENING BILAT W/ TOMO W/ CAD
8 of 13 series · 8 of 29 positions shown · non-contrast
Comparison: Previous exam(s).

CLINICAL DATA: Screening.

EXAM:
2D DIGITAL SCREENING BILATERAL MAMMOGRAM WITH CAD AND ADJUNCT TOMO

[L MLO (1 of 2)]
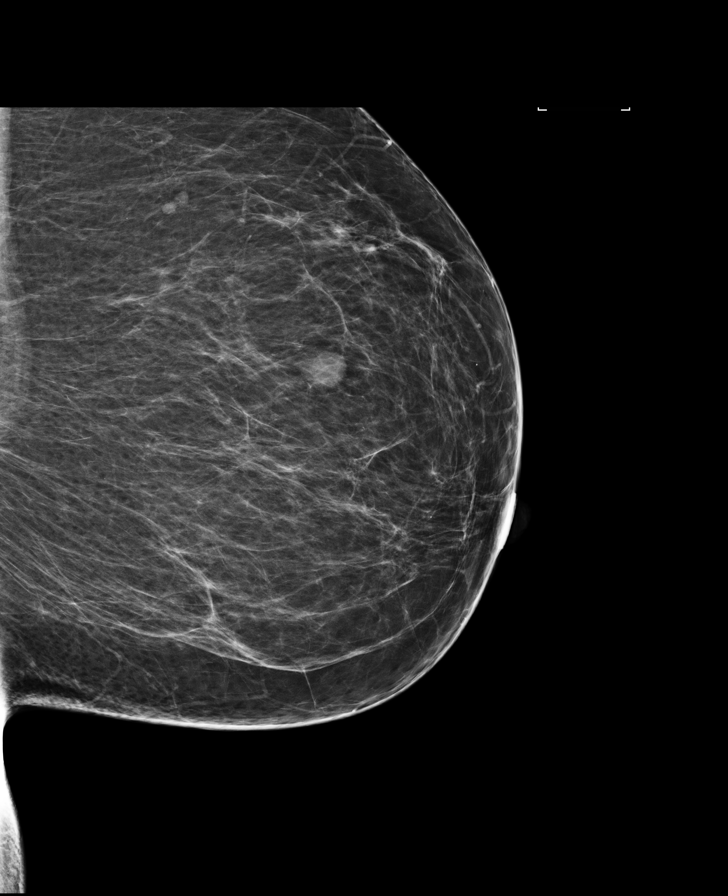

[R CC synth-2D]
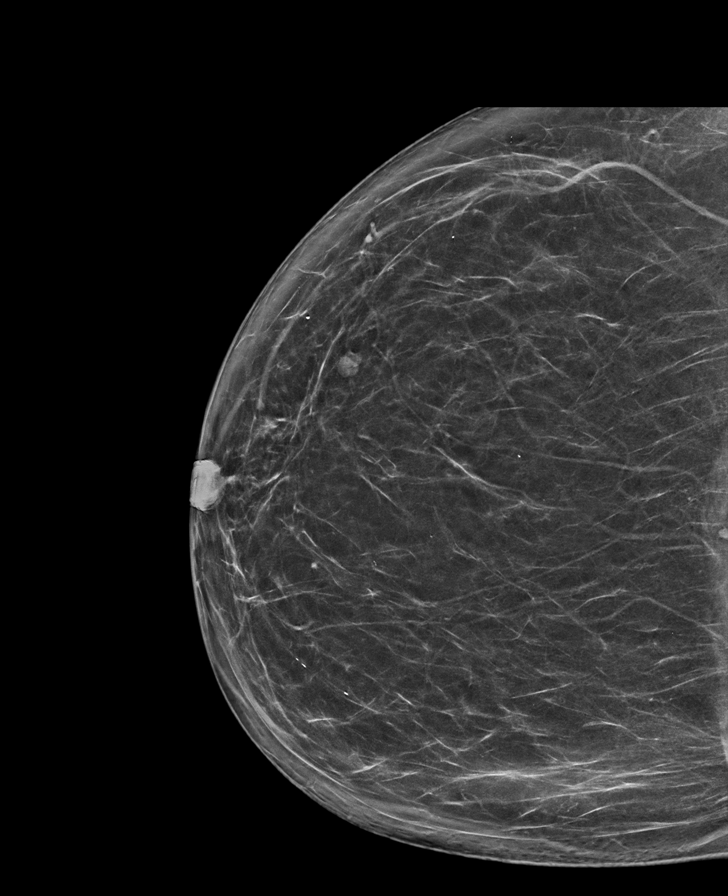

[R MLO]
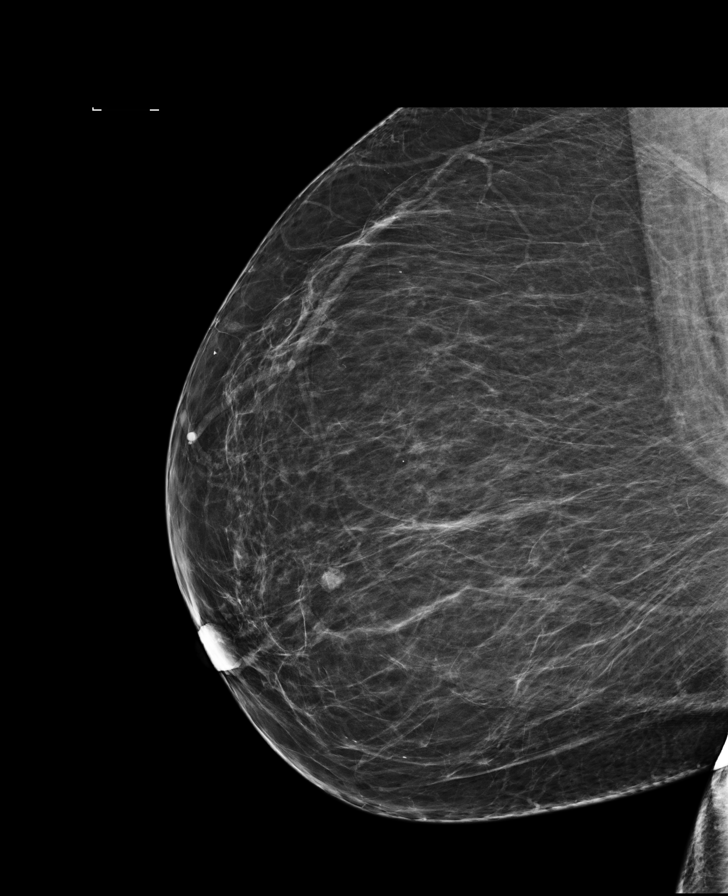

[L MLO (2 of 2)]
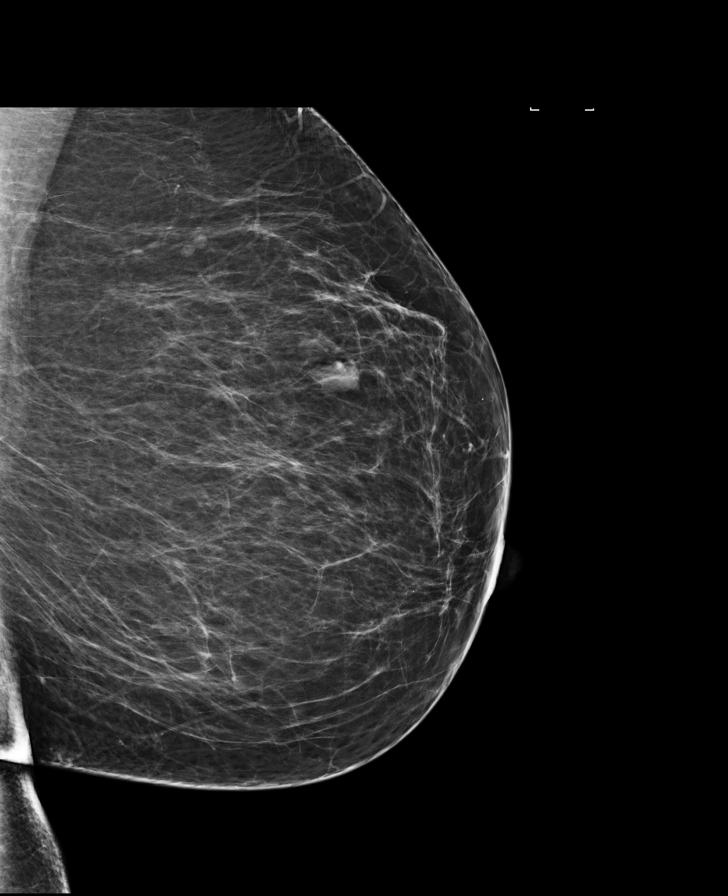

[L CC]
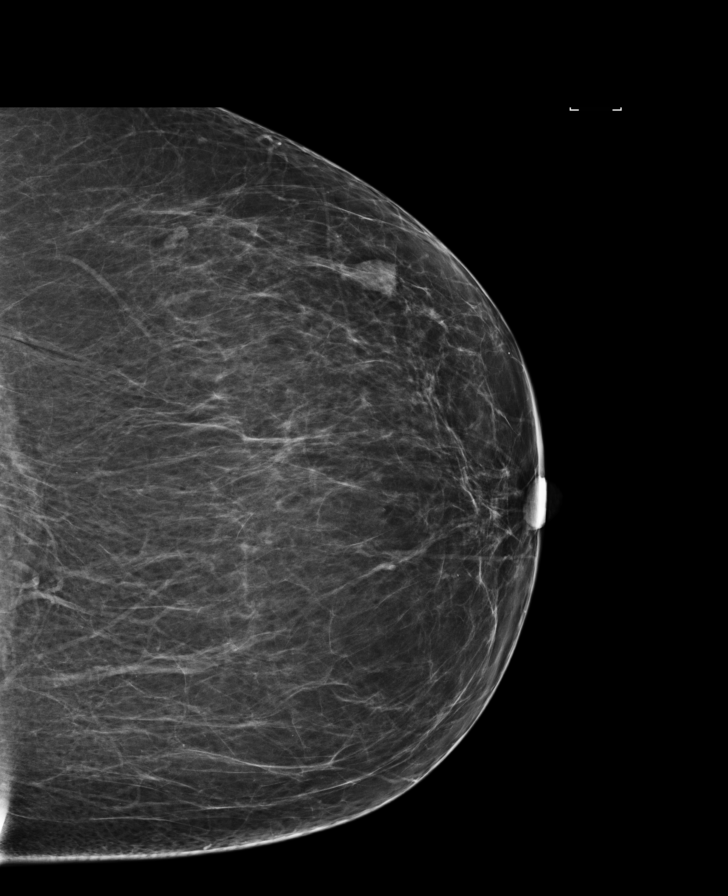

[R MLO synth-2D]
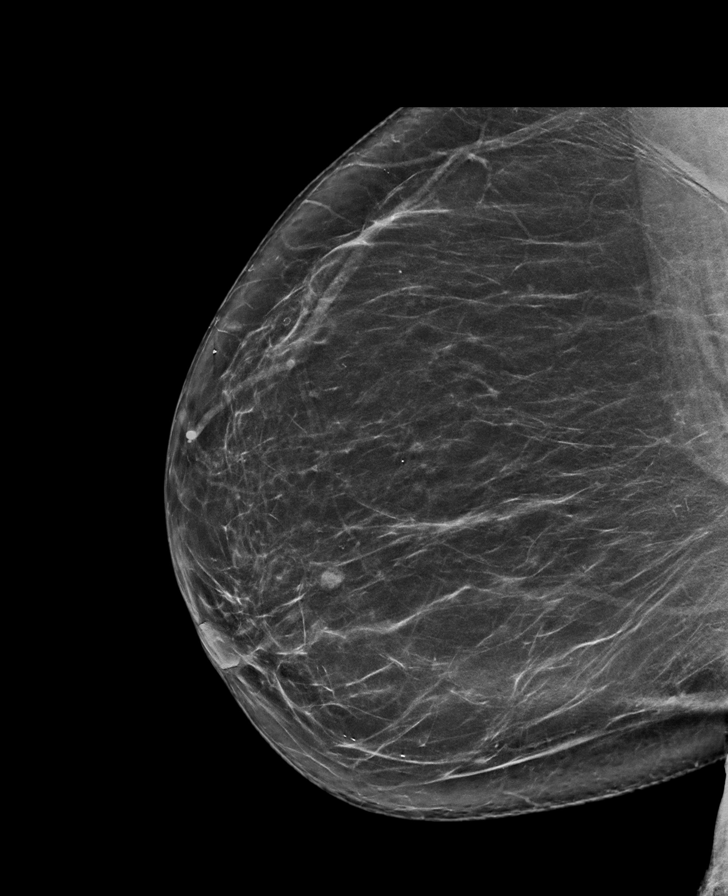

[L CC synth-2D]
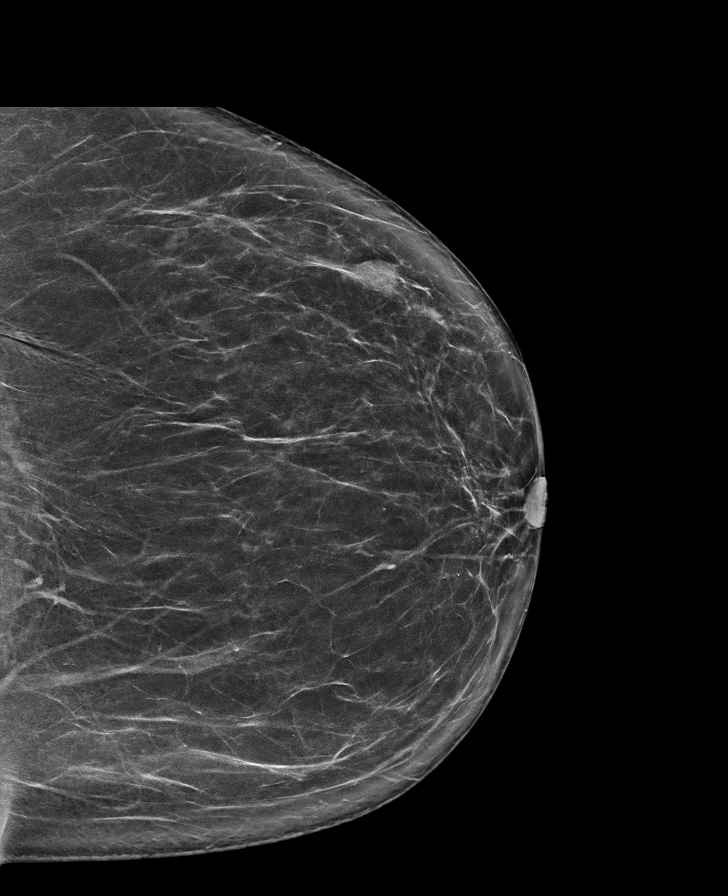

[L MLO synth-2D]
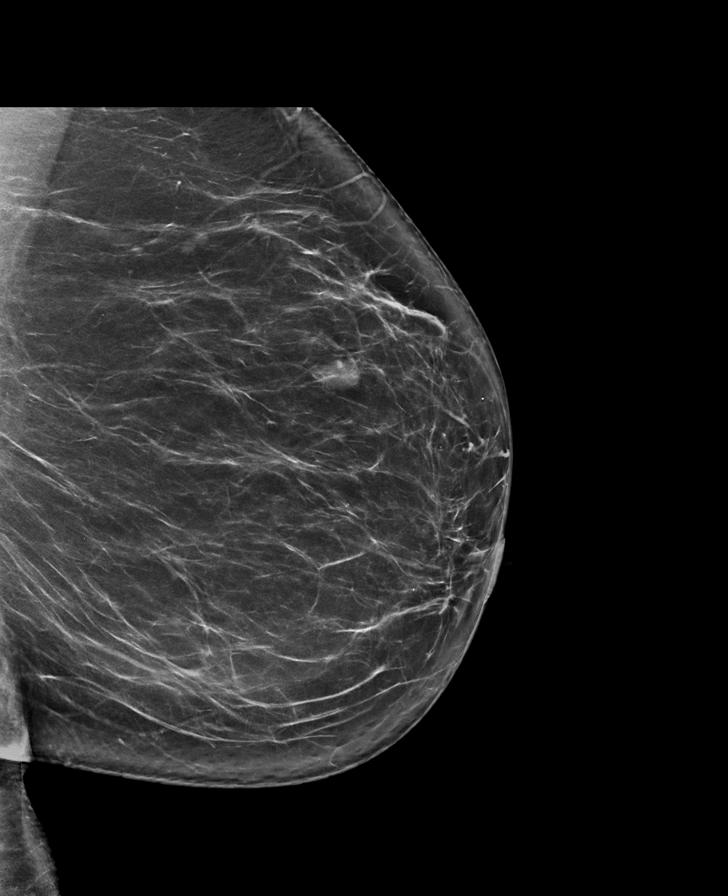

[8 of 29 positions shown; findings below may reference images not displayed]

ACR Breast Density Category b: There are scattered areas of
fibroglandular density.
FINDINGS: There are no findings suspicious for malignancy. Images were
processed with CAD.
IMPRESSION: No mammographic evidence of malignancy. A result letter of this
screening mammogram will be mailed directly to the patient.

RECOMMENDATION:
Screening mammogram in one year. (Code:97-6-RS4)

BI-RADS CATEGORY  1: Negative.

## 2017-10-11 ENCOUNTER — Encounter: Payer: Self-pay | Admitting: Internal Medicine

## 2017-10-11 NOTE — Assessment & Plan Note (Signed)
Followed by cardiology.  Off flecainide and metoprolol.  Doing well.  Just returned holter yesterday.  Keep f/u with cardiology.

## 2017-10-11 NOTE — Assessment & Plan Note (Signed)
S/p treatment with IVIG.  Has been released by neurology.  Doing well.  Discussed flu vaccine.  She received high dose flu vaccine last year - after her diagnosis and treatment of GBS.  See note from her neurologist.  They had discussed with two other neuromuscular physicians and determined risk of inluenza illness outweighed potentil risk of the flu vaccine.  Discussed at length with her today.  We discussed potential risk.  She wanted to receive ful vaccine.  Given.

## 2017-10-11 NOTE — Assessment & Plan Note (Addendum)
Low carb diet and exercise.  Follow met b and a1c.   

## 2017-10-11 NOTE — Assessment & Plan Note (Signed)
Discussed with her today.  On zoloft.  Doing better.  Follow.

## 2017-10-11 NOTE — Assessment & Plan Note (Signed)
Blood pressure doing well.  Continue current medication regimen.  Off metoprolol.  Follow.

## 2017-10-12 ENCOUNTER — Ambulatory Visit: Payer: 59 | Admitting: Internal Medicine

## 2017-10-14 ENCOUNTER — Ambulatory Visit: Payer: 59 | Admitting: Internal Medicine

## 2017-10-14 ENCOUNTER — Encounter: Payer: Self-pay | Admitting: Internal Medicine

## 2017-10-14 VITALS — BP 138/80 | HR 65 | Ht 63.0 in | Wt 215.8 lb

## 2017-10-14 DIAGNOSIS — I10 Essential (primary) hypertension: Secondary | ICD-10-CM

## 2017-10-14 DIAGNOSIS — I481 Persistent atrial fibrillation: Secondary | ICD-10-CM | POA: Diagnosis not present

## 2017-10-14 DIAGNOSIS — I4819 Other persistent atrial fibrillation: Secondary | ICD-10-CM

## 2017-10-14 NOTE — Patient Instructions (Signed)
Medication Instructions:  Your physician recommends that you continue on your current medications as directed. Please refer to the Current Medication list given to you today.  * If you need a refill on your cardiac medications before your next appointment, please call your pharmacy.   Labwork: None ordered  Testing/Procedures: None ordered  Follow-Up: Your physician recommends that you schedule a follow-up appointment in: 3 months with Roderic Palau, NP in the AFib Clinic  Thank you for choosing [facility]!!

## 2017-10-14 NOTE — Progress Notes (Signed)
PCP: Einar Pheasant, MD Primary Cardiologist: previously Dr Acie Fredrickson in [place] Primary EP: Dr Rayann Heman  Bonnie Shaw is a 67 y.o. female who presents today for routine electrophysiology followup.  Since last being seen in our clinic, the patient reports doing reasonably well.  Her afib has become more persistent.  She has recently been managed in AF clinic (notes reviewed). She had ED visit recently for sinus bradycardia and metoprolol was discontinued.  She states "I feel so much better".  She does not wish to make any changes today. Today, she denies symptoms of palpitations, chest pain, shortness of breath,  lower extremity edema, dizziness, presyncope, or syncope.  The patient is otherwise without complaint today.   Past Medical History:  Diagnosis Date  . Anxiety   . Cataract   . Complication of anesthesia   . Hyperlipidemia   . Hypertension   . Hyperthyroidism    s/p RAI therapy  . Left atrial enlargement    severely enlarged  . Obesity   . Panic disorder   . Persistent atrial fibrillation ([place])   . Snoring    Past Surgical History:  Procedure Laterality Date  . ABDOMINAL HYSTERECTOMY    . CARDIOVERSION N/A 09/11/2017   Procedure: CARDIOVERSION;  Surgeon: Skeet Latch, MD;  Location: [place];  Service: Cardiovascular;  Laterality: N/A;  . CATARACT EXTRACTION    . KNEE SURGERY     left knee  . THYROID ABLATION  2010    ROS- all systems are reviewed and negatives except as per HPI above  Current Outpatient Medications  Medication Sig Dispense Refill  . atorvastatin (LIPITOR) 20 MG tablet TAKE 1 TABLET BY MOUTH  DAILY 90 tablet 1  . ELIQUIS 5 MG TABS tablet TAKE 1 TABLET BY MOUTH TWICE DAILY 180 tablet 3  . hydrochlorothiazide (HYDRODIURIL) 25 MG tablet TAKE 1 TABLET BY MOUTH  DAILY 90 tablet 0  . levothyroxine (SYNTHROID, LEVOTHROID) 50 MCG tablet TAKE 1 TABLET BY MOUTH  DAILY 90 tablet 1  . potassium chloride (K-DUR,KLOR-CON) 10 MEQ tablet TAKE 1 TABLET BY  MOUTH  DAILY 90 tablet 1  . ranitidine (ZANTAC) 150 MG tablet Take 150 mg by mouth daily as needed for heartburn.    . sertraline (ZOLOFT) 50 MG tablet TAKE 1 TABLET BY MOUTH  EVERY DAY 90 tablet 0   No current facility-administered medications for this visit.     Physical Exam: Vitals:   10/14/17 1612  BP: 138/80  Pulse: 65  SpO2: 98%  Weight: 215 lb 12.8 oz (97.9 kg)  Height: 5\' 3"  (1.6 m)    GEN- The patient is well appearing, alert and oriented x 3 today.   Head- normocephalic, atraumatic Eyes-  Sclera clear, conjunctiva pink Ears- hearing intact Oropharynx- clear Lungs- Clear to ausculation bilaterally, normal work of breathing Heart- irregular rate and rhythm, no murmurs, rubs or gallops, PMI not laterally displaced GI- soft, NT, ND, + BS Extremities- no clubbing, cyanosis, or edema Psych- anxious, abrupt affect  Wt Readings from Last 3 Encounters:  10/14/17 215 lb 12.8 oz (97.9 kg)  10/08/17 216 lb 12.8 oz (98.3 kg)  09/30/17 219 lb (99.3 kg)    EKG 09/30/17 is personally reviewed and reveals afib, V rate 102 bpm  Echo 09/21/17 reveals EF55%, mild MR, LA severely enlarged (56 mm)  Event monitor reveals 100% afib, average HR 92 bpm, RVR noted at times, nocturnal bradycardia also noted  Assessment and Plan:  1. Persistent atrial fibrillation She has failed medical  therapy with flecainide Severe LA enlargement chads2vasc score is 3.  On eliquis We discussed tikosyn as an option for rhythm control.  Given severe LA enlargement and current paucity of symptoms, I would not favor ablation. She is clear that she does not wish to take tikosyn or any additional medicines at this time. We reviewed her monitor today.  She is clear that she does not wish to resume beta blockers.  We discussed afib at length.  She is somewhat abrupt and frequent says "you doctors all tell me something different". She seems to have significant scepticism of health care in general. Continue rate  control  2. Morbid obesity Body mass index is 38.23 kg/m. Wt Readings from Last 3 Encounters:  10/14/17 215 lb 12.8 oz (97.9 kg)  10/08/17 216 lb 12.8 oz (98.3 kg)  09/30/17 219 lb (99.3 kg)   Lifestyle modification encouarged She does not appear interested in lifestyle change today  3. HTN Stable No change required today  Follow-up in AF clinic every 3 months I will see as needed going forward  Thompson Grayer MD, [facility] 10/14/2017 4:25 PM

## 2017-11-03 ENCOUNTER — Ambulatory Visit
Admission: RE | Admit: 2017-11-03 | Discharge: 2017-11-03 | Disposition: A | Discharge status: Home / Self Care | Payer: 59 | Source: Ambulatory Visit | Attending: Internal Medicine | Admitting: Internal Medicine

## 2017-11-03 DIAGNOSIS — Z1239 Encounter for other screening for malignant neoplasm of breast: Secondary | ICD-10-CM | POA: Diagnosis present

## 2017-11-12 ENCOUNTER — Encounter: Payer: Self-pay | Admitting: Internal Medicine

## 2017-11-12 ENCOUNTER — Ambulatory Visit (INDEPENDENT_AMBULATORY_CARE_PROVIDER_SITE_OTHER): Payer: 59 | Admitting: Internal Medicine

## 2017-11-12 DIAGNOSIS — F419 Anxiety disorder, unspecified: Secondary | ICD-10-CM

## 2017-11-12 DIAGNOSIS — Z6838 Body mass index (BMI) 38.0-38.9, adult: Secondary | ICD-10-CM | POA: Diagnosis not present

## 2017-11-12 DIAGNOSIS — R739 Hyperglycemia, unspecified: Secondary | ICD-10-CM

## 2017-11-12 DIAGNOSIS — I4891 Unspecified atrial fibrillation: Secondary | ICD-10-CM | POA: Diagnosis not present

## 2017-11-12 DIAGNOSIS — I10 Essential (primary) hypertension: Secondary | ICD-10-CM

## 2017-11-12 DIAGNOSIS — E785 Hyperlipidemia, unspecified: Secondary | ICD-10-CM

## 2017-11-12 DIAGNOSIS — E559 Vitamin D deficiency, unspecified: Secondary | ICD-10-CM

## 2017-11-12 DIAGNOSIS — E039 Hypothyroidism, unspecified: Secondary | ICD-10-CM

## 2017-11-12 NOTE — Progress Notes (Signed)
Patient ID: ALEYA DURNELL, female   DOB: 1950-11-08, 67 y.o.   MRN: [id number]   Subjective:    Patient ID: KALIEGH WILLADSEN, female    DOB: [birth year], 67 y.o.   MRN: [id number]  HPI  Patient here for a scheduled follow up.  States she feels good.  Off metoprolol.  Feels better off.  No chest pain.  Breathing stable.  Stays active.  No acid reflux.  No abdominal pain.  Bowels moving.  Blood pressures averaging 110-120s/60-70s.  Sleeping well.  Still some anxiety at times with her afib, but overall she feels she is doing better and handling things better.  Does not feel needs any further intervention.     Past Medical History:  Diagnosis Date  . Anxiety   . Cataract   . Complication of anesthesia   . Hyperlipidemia   . Hypertension   . Hyperthyroidism    s/p RAI therapy  . Left atrial enlargement    severely enlarged  . Obesity   . Panic disorder   . Persistent atrial fibrillation   . Snoring    Past Surgical History:  Procedure Laterality Date  . ABDOMINAL HYSTERECTOMY    . CARDIOVERSION N/A 09/11/2017   Procedure: CARDIOVERSION;  Surgeon: Skeet Latch, MD;  Location: [place];  Service: Cardiovascular;  Laterality: N/A;  . CATARACT EXTRACTION    . KNEE SURGERY     left knee  . THYROID ABLATION  2010   Family History  Problem Relation Age of Onset  . Alzheimer's disease Father   . Stroke Mother   . Cancer Sister        ovarian   . Ovarian cancer Sister 32  . Breast cancer Neg Hx    Social History   Socioeconomic History  . Marital status: Married    Spouse name: Not on file  . Number of children: 4  . Years of education: Not on file  . Highest education level: Not on file  Occupational History    Employer: UNEMPLOYED  Social Needs  . Financial resource strain: Not on file  . Food insecurity:    Worry: Not on file    Inability: Not on file  . Transportation needs:    Medical: Not on file    Non-medical: Not on file  Tobacco Use  . Smoking status:  Never Smoker  . Smokeless tobacco: Never Used  Substance and Sexual Activity  . Alcohol use: No  . Drug use: No  . Sexual activity: Not on file  Lifestyle  . Physical activity:    Days per week: Not on file    Minutes per session: Not on file  . Stress: Not on file  Relationships  . Social connections:    Talks on phone: Not on file    Gets together: Not on file    Attends religious service: Not on file    Active member of club or organization: Not on file    Attends meetings of clubs or organizations: Not on file    Relationship status: Not on file  Other Topics Concern  . 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 atorvastatin (LIPITOR)  20 MG tablet TAKE 1 TABLET BY MOUTH  DAILY  . ELIQUIS 5 MG TABS tablet TAKE 1 TABLET BY MOUTH TWICE DAILY  . hydrochlorothiazide (HYDRODIURIL) 25 MG tablet TAKE 1 TABLET BY MOUTH  DAILY  . levothyroxine (SYNTHROID, LEVOTHROID) 50 MCG tablet TAKE 1 TABLET BY MOUTH  DAILY  . potassium chloride (K-DUR,KLOR-CON) 10 MEQ tablet TAKE 1 TABLET BY MOUTH  DAILY  . sertraline (ZOLOFT) 50 MG tablet TAKE 1 TABLET BY MOUTH  EVERY DAY  . [DISCONTINUED] ranitidine (ZANTAC) 150 MG tablet Take 150 mg by mouth daily as needed for heartburn.   No facility-administered encounter medications on file as of 11/12/2017.     Review of Systems  Constitutional: Negative for appetite change and unexpected weight change.  HENT: Negative for congestion and sinus pressure.   Respiratory: Negative for cough, chest tightness and shortness of breath.   Cardiovascular: Negative for chest pain, palpitations and leg swelling.  Gastrointestinal: Negative for abdominal pain, diarrhea, nausea and vomiting.  Genitourinary:  Negative for difficulty urinating and dysuria.  Musculoskeletal: Negative for joint swelling and myalgias.  Skin: Negative for color change and rash.  Neurological: Negative for dizziness, light-headedness and headaches.  Psychiatric/Behavioral: Negative for agitation and dysphoric mood.       Objective:    Physical Exam  Constitutional: She appears well-developed and well-nourished. No distress.  HENT:  Nose: Nose normal.  Mouth/Throat: Oropharynx is clear and moist.  Neck: Neck supple. No thyromegaly present.  Cardiovascular: Normal rate and regular rhythm.  Pulmonary/Chest: Breath sounds normal. No respiratory distress. She has no wheezes.  Abdominal: Soft. Bowel sounds are normal. There is no tenderness.  Musculoskeletal: She exhibits no edema or tenderness.  Lymphadenopathy:    She has no cervical adenopathy.  Skin: No rash noted. No erythema.  Psychiatric: She has a normal mood and affect. Her behavior is normal.    BP 124/84 (BP Location: Right Arm, Patient Position: Sitting, Cuff Size: Large)   Pulse 80   Temp 98.3 F (36.8 C) (Oral)   Resp 15   Ht '5\' 3"'$  (1.6 m)   Wt 218 lb 8 oz (99.1 kg)   SpO2 97%   BMI 38.71 kg/m  Wt Readings from Last 3 Encounters:  11/12/17 218 lb 8 oz (99.1 kg)  10/14/17 215 lb 12.8 oz (97.9 kg)  10/08/17 216 lb 12.8 oz (98.3 kg)     Lab Results  Component Value Date   WBC 7.5 10/08/2017   HGB 15.0 10/08/2017   HCT 43.3 10/08/2017   PLT 223.0 10/08/2017   GLUCOSE 107 (H) 10/08/2017   CHOL 170 09/08/2017   TRIG 181 (H) 09/08/2017   HDL 42 (L) 09/08/2017   LDLDIRECT 97.0 12/14/2015   LDLCALC 100 (H) 09/08/2017   ALT 11 09/08/2017   AST 12 09/08/2017   NA 142 10/08/2017   K 3.8 10/08/2017   CL 105 10/08/2017   CREATININE 0.79 10/08/2017   BUN 14 10/08/2017   CO2 31 10/08/2017   TSH 3.32 09/08/2017   HGBA1C 5.6 09/08/2017   MICROALBUR <0.7 06/11/2015    Mm 3d Screen Breast Bilateral  Result Date: 11/03/2017 CLINICAL  DATA:  Screening. EXAM: DIGITAL SCREENING BILATERAL MAMMOGRAM WITH TOMO AND CAD COMPARISON:  Previous exam(s). ACR Breast Density Category b: There are scattered areas of fibroglandular density. FINDINGS: There are no findings suspicious for malignancy. Images were processed with CAD. IMPRESSION: No mammographic evidence of malignancy. A result letter of this screening mammogram will be mailed directly to the  patient. RECOMMENDATION: Screening mammogram in one year. (Code:SM-B-01Y) BI-RADS CATEGORY  1: Negative. Electronically Signed   By: Franki Cabot M.D.   On: 11/03/2017 10:47       Assessment & Plan:   Problem List Items Addressed This Visit    Anxiety    On zoloft.  Doing better.  Does not feel needs anything more at this time.  Follow.        Atrial fibrillation (HCC)    Off flecainide and metoprolol.  Feels better.  Followed by cardiology.  Desires no further intervention.  Overall feels things are stable.  Staying active.  Continue eliquis.        BMI 38.0-38.9,adult    Discussed diet and exercise.  Follow.       Hyperglycemia    Low carb diet and exercise.  Follow met b and a1c.        Relevant Orders   Hemoglobin A1c   Hyperlipidemia    Low cholesterol diet and exercise.  Follow lipid panel.        Relevant Orders   Hepatic function panel   Lipid panel   Hypertension    Blood pressure under good control.  Continue same medication regimen.  Follow pressures.  Follow metabolic panel.        Relevant Orders   Basic metabolic panel   Hypothyroidism    On thyroid replacement.  Follow tsh.       Vitamin D deficiency    Follow vitamin D level.            Einar Pheasant, MD

## 2017-11-15 ENCOUNTER — Other Ambulatory Visit: Payer: Self-pay | Admitting: Internal Medicine

## 2017-11-15 ENCOUNTER — Encounter: Payer: Self-pay | Admitting: Internal Medicine

## 2017-11-15 NOTE — Assessment & Plan Note (Signed)
Low carb diet and exercise.  Follow met b and a1c.   

## 2017-11-15 NOTE — Assessment & Plan Note (Signed)
Follow vitamin D level.  

## 2017-11-15 NOTE — Assessment & Plan Note (Signed)
Off flecainide and metoprolol.  Feels better.  Followed by cardiology.  Desires no further intervention.  Overall feels things are stable.  Staying active.  Continue eliquis.

## 2017-11-15 NOTE — Assessment & Plan Note (Signed)
On zoloft.  Doing better.  Does not feel needs anything more at this time.  Follow.

## 2017-11-15 NOTE — Assessment & Plan Note (Signed)
Discussed diet and exercise.  Follow.  

## 2017-11-15 NOTE — Assessment & Plan Note (Signed)
On thyroid replacement.  Follow tsh.  

## 2017-11-15 NOTE — Assessment & Plan Note (Signed)
Blood pressure under good control.  Continue same medication regimen.  Follow pressures.  Follow metabolic panel.   

## 2017-11-15 NOTE — Assessment & Plan Note (Signed)
Low cholesterol diet and exercise.  Follow lipid panel.   

## 2018-01-13 ENCOUNTER — Other Ambulatory Visit (INDEPENDENT_AMBULATORY_CARE_PROVIDER_SITE_OTHER): Payer: 59

## 2018-01-13 ENCOUNTER — Encounter: Payer: Self-pay | Admitting: Internal Medicine

## 2018-01-13 DIAGNOSIS — E785 Hyperlipidemia, unspecified: Secondary | ICD-10-CM | POA: Diagnosis not present

## 2018-01-13 DIAGNOSIS — I10 Essential (primary) hypertension: Secondary | ICD-10-CM

## 2018-01-13 DIAGNOSIS — R739 Hyperglycemia, unspecified: Secondary | ICD-10-CM | POA: Diagnosis not present

## 2018-01-13 LAB — LIPID PANEL
Cholesterol: 147 mg/dL (ref 0–200)
HDL: 41.4 mg/dL (ref >39.00)
LDL CALC: 78 mg/dL (ref 0–99)
NonHDL: 105.72
TRIGLYCERIDES: 137 mg/dL (ref 0.0–149.0)
Total CHOL/HDL Ratio: 4
VLDL: 27.4 mg/dL (ref 0.0–40.0)

## 2018-01-13 LAB — BASIC METABOLIC PANEL
BUN: 13 mg/dL (ref 6–23)
CO2: 28 mEq/L (ref 19–32)
Calcium: 9.5 mg/dL (ref 8.4–10.5)
Chloride: 105 mEq/L (ref 96–112)
Creatinine, Ser: 0.78 mg/dL (ref 0.40–1.20)
GFR: 77.99 mL/min (ref >60.00)
Glucose, Bld: 109 mg/dL — ABNORMAL HIGH (ref 70–99)
POTASSIUM: 4 meq/L (ref 3.5–5.1)
Sodium: 141 mEq/L (ref 135–145)

## 2018-01-13 LAB — HEPATIC FUNCTION PANEL
ALK PHOS: 55 U/L (ref 39–117)
ALT: 10 U/L (ref 0–35)
AST: 13 U/L (ref 0–37)
Albumin: 4.2 g/dL (ref 3.5–5.2)
Bilirubin, Direct: 0.2 mg/dL (ref 0.0–0.3)
Total Bilirubin: 1.4 mg/dL — ABNORMAL HIGH (ref 0.2–1.2)
Total Protein: 6.4 g/dL (ref 6.0–8.3)

## 2018-01-13 LAB — HEMOGLOBIN A1C: Hgb A1c MFr Bld: 5.7 % (ref 4.6–6.5)

## 2018-01-14 ENCOUNTER — Encounter (HOSPITAL_COMMUNITY): Payer: Self-pay | Admitting: Nurse Practitioner

## 2018-01-14 ENCOUNTER — Ambulatory Visit (HOSPITAL_COMMUNITY)
Admission: RE | Admit: 2018-01-14 | Discharge: 2018-01-14 | Disposition: A | Discharge status: Home / Self Care | Payer: 59 | Source: Ambulatory Visit | Attending: Nurse Practitioner | Admitting: Nurse Practitioner

## 2018-01-14 VITALS — BP 142/84 | HR 116 | Ht 63.0 in | Wt 217.0 lb

## 2018-01-14 DIAGNOSIS — E0590 Thyrotoxicosis, unspecified without thyrotoxic crisis or storm: Secondary | ICD-10-CM | POA: Diagnosis not present

## 2018-01-14 DIAGNOSIS — Z79890 Hormone replacement therapy (postmenopausal): Secondary | ICD-10-CM | POA: Diagnosis not present

## 2018-01-14 DIAGNOSIS — Z6838 Body mass index (BMI) 38.0-38.9, adult: Secondary | ICD-10-CM | POA: Insufficient documentation

## 2018-01-14 DIAGNOSIS — E669 Obesity, unspecified: Secondary | ICD-10-CM | POA: Insufficient documentation

## 2018-01-14 DIAGNOSIS — Z888 Allergy status to other drugs, medicaments and biological substances status: Secondary | ICD-10-CM | POA: Insufficient documentation

## 2018-01-14 DIAGNOSIS — Z886 Allergy status to analgesic agent status: Secondary | ICD-10-CM | POA: Diagnosis not present

## 2018-01-14 DIAGNOSIS — Z7901 Long term (current) use of anticoagulants: Secondary | ICD-10-CM | POA: Diagnosis not present

## 2018-01-14 DIAGNOSIS — Z79899 Other long term (current) drug therapy: Secondary | ICD-10-CM | POA: Insufficient documentation

## 2018-01-14 DIAGNOSIS — Z9889 Other specified postprocedural states: Secondary | ICD-10-CM | POA: Diagnosis not present

## 2018-01-14 DIAGNOSIS — I480 Paroxysmal atrial fibrillation: Secondary | ICD-10-CM | POA: Insufficient documentation

## 2018-01-14 DIAGNOSIS — E785 Hyperlipidemia, unspecified: Secondary | ICD-10-CM | POA: Diagnosis not present

## 2018-01-14 DIAGNOSIS — I4819 Other persistent atrial fibrillation: Secondary | ICD-10-CM | POA: Diagnosis present

## 2018-01-14 DIAGNOSIS — I10 Essential (primary) hypertension: Secondary | ICD-10-CM | POA: Insufficient documentation

## 2018-01-14 DIAGNOSIS — F419 Anxiety disorder, unspecified: Secondary | ICD-10-CM | POA: Insufficient documentation

## 2018-01-14 NOTE — Progress Notes (Signed)
Patient ID: Bonnie Shaw, female   DOB: [birth year], 67 y.o.   MRN: [id number]     Primary Care Physician: Einar Pheasant, MD Referring Physician: Dr. Donnie Mesa is a 67 y.o. female with a h/o PAF that was started on flecainide 50 mg bid by Dr. Rayann Heman latter part of June 2017.  She has not had any further afib and doing well tolerating flecainide. HR is usually in 50's at home, tolerating with minimal dizziness. She has had a sleep study and was negative. Echo showed enlargement of left atrium of 50 mm. Despite this, she has done well with minimum breakthrough afib.  She returns to afib clinic 07/29/16. She reports that she had a persistent URI and then started developing neuro symptoms of ataxia and change in sensation with touch. She was admitted to [facility], could not walk the next am and was later transferred to [facility] center for possible [place] Founds which spinal tap confirmed and pt was treat with immunoglobulin  therapy.She was sent to SNF, rehabilitated  quickly and was d/c after 5 days. She did not have any issues with afib while there.  F/u in afib clinic 02/25/17, on last visit she saw Dr. Rayann Heman in December, BB was reduced for bradycardia. Since then she has noted a few episodes of afib, longest x one hour and HR was less than one hundred. She states that she lets her anxiety get the best of her and was afraid these short breakthrough episodes may damage her heart. HR is trending in the 60's with reduction of BB.   F/u in afib clinic, 09/01/17 pt states she feels well but she is in rate controlled afib. She is unaware. Otherwise. No issues  Pt returns to clinic 09/08/17 and ekg confirms that pt is still in afib. She is tolerating well. Discussed increasing flecainide to 100 mg bid and scheduling for cardioversion and she is in agreement.   F/u after cardioversion, 8/12. Unfortunately, it only lasted one day. This time, she was aware that she returned to afib by the use of a  [place]. She really is not that symptomatic. It was discussed at visit to see Dr. Rayann Heman to see if she would be an candidate for  ablation vrs change in antiarrythmic or living in afib as she states that she feels well. I worry about her left atrium size of 56 mm to maintain SR.   F/u 8/28, in afib clinic, for ER visit. She was in church this past Sunday and developed dizziness/pre syncope.  Her son in law who is a PA checked her pulse and it was 45 and regular. She wanted to come to the ER and be evaluated. She was asymptomatic and in rate controlled afib on arrival. Labs were unremarkable. Cardiology was consulted and she was thought to be ok to go home.She however, with her anxiety, did not want to go home and was kept overnight at her insistence/AMA. She was told that she would need a monitor as an outpatient. Metoprolol was stopped. In the office, she is rate controlled but has been super anxious ever since she was told she was in afib.  No further spells.   F/u in afib clinic, 01/14/18. She has chosen to live in  afib. She is tolerating well and has no complaints. Her v rates are elevated in the clinic but at home, the v rates are in the 70's and 80's.  Today, she denies symptoms of palpitations, chest pain,  shortness of breath, orthopnea, PND, lower extremity edema, dizziness, presyncope, syncope, or neurologic sequela. The patient is tolerating medications without difficulties and is otherwise without complaint today.   Past Medical History:  Diagnosis Date  . Anxiety   . Cataract   . Complication of anesthesia   . Hyperlipidemia   . Hypertension   . Hyperthyroidism    s/p RAI therapy  . Left atrial enlargement    severely enlarged  . Obesity   . Panic disorder   . Persistent atrial fibrillation   . Snoring    Past Surgical History:  Procedure Laterality Date  . ABDOMINAL HYSTERECTOMY    . CARDIOVERSION N/A 09/11/2017   Procedure: CARDIOVERSION;  Surgeon: Skeet Latch, MD;   Location: [place];  Service: Cardiovascular;  Laterality: N/A;  . CATARACT EXTRACTION    . KNEE SURGERY     left knee  . THYROID ABLATION  2010    Current Outpatient Medications  Medication Sig Dispense Refill  . atorvastatin (LIPITOR) 20 MG tablet TAKE 1 TABLET BY MOUTH  DAILY 90 tablet 1  . ELIQUIS 5 MG TABS tablet TAKE 1 TABLET BY MOUTH TWICE DAILY 180 tablet 3  . hydrochlorothiazide (HYDRODIURIL) 25 MG tablet TAKE 1 TABLET BY MOUTH  DAILY 90 tablet 0  . levothyroxine (SYNTHROID, LEVOTHROID) 50 MCG tablet TAKE 1 TABLET BY MOUTH  DAILY 90 tablet 1  . potassium chloride (K-DUR,KLOR-CON) 10 MEQ tablet TAKE 1 TABLET BY MOUTH  DAILY 90 tablet 1  . sertraline (ZOLOFT) 50 MG tablet TAKE 1 TABLET BY MOUTH  EVERY DAY 90 tablet 0   No current facility-administered medications for this encounter.     Allergies  Allergen Reactions  . Ativan [Lorazepam] Other (See Comments)    High sensitivity, cause pt to pass out  . Anesthesia S-I-60 Other (See Comments)    Pt states that she woke up early during her last colonoscopy and knee surgery  . Meloxicam Other (See Comments)    Hallucinations  . Sulfonamide Derivatives Rash    Social History   Socioeconomic History  . Marital status: Married    Spouse name: Not on file  . Number of children: 4  . Years of education: Not on file  . Highest education level: Not on file  Occupational History    Employer: UNEMPLOYED  Social Needs  . Financial resource strain: Not on file  . Food insecurity:    Worry: Not on file    Inability: Not on file  . Transportation needs:    Medical: Not on file    Non-medical: Not on file  Tobacco Use  . Smoking status: Never Smoker  . Smokeless tobacco: Never Used  Substance and Sexual Activity  . Alcohol use: No  . Drug use: No  . Sexual activity: Not on file  Lifestyle  . Physical activity:    Days per week: Not on file    Minutes per session: Not on file  . Stress: Not on file  Relationships    . Social connections:    Talks on phone: Not on file    Gets together: Not on file    Attends religious service: Not on file    Active member of club or organization: Not on file    Attends meetings of clubs or organizations: Not on file    Relationship status: Not on file  . Intimate partner violence:    Fear of current or ex partner: Not on file    Emotionally abused:  Not on file    Physically abused: Not on file    Forced sexual activity: Not on file  Other Topics Concern  . 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 Alzheimer's disease Father   . Stroke Mother   . Cancer Sister        ovarian   . Ovarian cancer Sister 72  . Breast cancer Neg Hx     ROS- All systems are reviewed and negative except as per the HPI above  Physical Exam: Vitals:   01/14/18 0957  Weight: 98.4 kg  Height: 5\' 3"  (1.6 m)    GEN- The patient is well appearing, alert and oriented x 3 today.   Head- normocephalic, atraumatic Eyes-  Sclera clear, conjunctiva pink Ears- hearing intact Oropharynx- clear Neck- supple, no JVP Lymph- no cervical lymphadenopathy Lungs- Clear to ausculation bilaterally, normal work of breathing Heart- irregular rate and rhythm, no murmurs, rubs or gallops, PMI not laterally displaced GI- soft, NT, ND, + BS Extremities- no clubbing, cyanosis, or edema MS- no significant deformity or atrophy Skin- no rash or lesion Psych- euthymic mood, full affect Neuro- strength and sensation are intact  EKG- afib at 1116 bpm, qrs int 66 ms, qtc 478 ms Epic records reviewed Echo -8/19-Study Conclusions  - Left ventricle: The cavity size was normal. There was mild focal   basal hypertrophy of the septum. Systolic function was normal.    The estimated ejection fraction was in the range of 55% to 60%.   Wall motion was normal; there were no regional wall motion   abnormalities. Features are consistent with a pseudonormal left   ventricular filling pattern, with concomitant abnormal relaxation   and increased filling pressure (grade 2 diastolic dysfunction). - Mitral valve: Calcified annulus. Mildly thickened leaflets .   There was mild regurgitation. - Left atrium: The atrium was severely dilated. Volume/bsa, S: 59.6   ml/m^2.56 mm - Tricuspid valve: There was trivial regurgitation.  Assessment and Plan: 1. Persisitent afib Successful cardioversion but ERAF Afib was asymptomatic with afib, did have a presyncopal spell(? Converted briefly to SR/termination pause) BB was stopped in the hospital Now off flecainide  She is clear in her decision that she does not want any rate control meds nor further attempts to restore SR She was felt not to be a candidate for ablation 2/2  size of left atrium and paucity of symptoms  Continue on eliquis 5 mg bid, no missed doses  2. HTN Stable   F/U 4 months   [name]. Anastasiya Gowin, [facility] [address] [place], [place] [address] [phone]

## 2018-01-20 ENCOUNTER — Ambulatory Visit: Payer: 59 | Admitting: Internal Medicine

## 2018-01-20 ENCOUNTER — Encounter: Payer: Self-pay | Admitting: Internal Medicine

## 2018-01-20 DIAGNOSIS — I4891 Unspecified atrial fibrillation: Secondary | ICD-10-CM

## 2018-01-20 DIAGNOSIS — R739 Hyperglycemia, unspecified: Secondary | ICD-10-CM

## 2018-01-20 DIAGNOSIS — E785 Hyperlipidemia, unspecified: Secondary | ICD-10-CM

## 2018-01-20 DIAGNOSIS — F419 Anxiety disorder, unspecified: Secondary | ICD-10-CM

## 2018-01-20 DIAGNOSIS — E039 Hypothyroidism, unspecified: Secondary | ICD-10-CM

## 2018-01-20 DIAGNOSIS — E559 Vitamin D deficiency, unspecified: Secondary | ICD-10-CM

## 2018-01-20 DIAGNOSIS — I10 Essential (primary) hypertension: Secondary | ICD-10-CM

## 2018-01-20 NOTE — Assessment & Plan Note (Signed)
On zoloft.  Stable.  Follow.  

## 2018-01-20 NOTE — Assessment & Plan Note (Signed)
Off flecainde and metoprolol.  Feels better.  Ankle swelling better.  Rate controlled.  Sees cardiology.

## 2018-01-20 NOTE — Assessment & Plan Note (Signed)
On lipitor.  Low cholesterol diet and exercise.  Follow lipid panel and liver function tests.   

## 2018-01-20 NOTE — Progress Notes (Signed)
Patient ID: MAIA HANDA, female   DOB: Sep 04, 1950, 67 y.o.   MRN: [id number]   Subjective:    Patient ID: FINESSE FIELDER, female    DOB: 09-20-1950, 67 y.o.   MRN: [id number]  HPI  Patient here for a scheduled follow up.  Has a history of paroxysmal afib previously on flecainide.  She remains in afib.  Tolerating well.  No chest pain.  Tries to stay active.  Breathing stable.   No acid reflux.  No abdominal pain.  Bowels moving.  No urine change.  Was just evaluated by cardiology.  Note reviewed.  When there, states she was anxious.  Blood pressure and pulse elevated.  No changes made in medication.  Has been monitoring her blood pressure.  Blood pressures averaging 116-120/70-80.  Discussed labs.     Past Medical History:  Diagnosis Date  . Anxiety   . Cataract   . Complication of anesthesia   . Hyperlipidemia   . Hypertension   . Hyperthyroidism    s/p RAI therapy  . Left atrial enlargement    severely enlarged  . Obesity   . Panic disorder   . Persistent atrial fibrillation   . Snoring    Past Surgical History:  Procedure Laterality Date  . ABDOMINAL HYSTERECTOMY    . CARDIOVERSION N/A 09/11/2017   Procedure: CARDIOVERSION;  Surgeon: Skeet Latch, MD;  Location: [place];  Service: Cardiovascular;  Laterality: N/A;  . CATARACT EXTRACTION    . KNEE SURGERY     left knee  . THYROID ABLATION  2010   Family History  Problem Relation Age of Onset  . Alzheimer's disease Father   . Stroke Mother   . Cancer Sister        ovarian   . Ovarian cancer Sister 73  . Breast cancer Neg Hx    Social History   Socioeconomic History  . Marital status: Married    Spouse name: Not on file  . Number of children: 4  . Years of education: Not on file  . Highest education level: Not on file  Occupational History    Employer: UNEMPLOYED  Social Needs  . Financial resource strain: Not on file  . Food insecurity:    Worry: Not on file    Inability: Not on file  .  Transportation needs:    Medical: Not on file    Non-medical: Not on file  Tobacco Use  . Smoking status: Never Smoker  . Smokeless tobacco: Never Used  Substance and Sexual Activity  . Alcohol use: No  . Drug use: No  . Sexual activity: Not on file  Lifestyle  . Physical activity:    Days per week: Not on file    Minutes per session: Not on file  . Stress: Not on file  Relationships  . Social connections:    Talks on phone: Not on file    Gets together: Not on file    Attends religious service: Not on file    Active member of club or organization: Not on file    Attends meetings of clubs or organizations: Not on file    Relationship status: Not on file  Other Topics Concern  . 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 atorvastatin (LIPITOR) 20 MG tablet TAKE 1 TABLET BY MOUTH  DAILY  . ELIQUIS 5 MG TABS tablet TAKE 1 TABLET BY MOUTH TWICE DAILY  . hydrochlorothiazide (HYDRODIURIL) 25 MG tablet TAKE 1 TABLET BY MOUTH  DAILY  . levothyroxine (SYNTHROID, LEVOTHROID) 50 MCG tablet TAKE 1 TABLET BY MOUTH  DAILY  . potassium chloride (K-DUR,KLOR-CON) 10 MEQ tablet TAKE 1 TABLET BY MOUTH  DAILY  . sertraline (ZOLOFT) 50 MG tablet TAKE 1 TABLET BY MOUTH  EVERY DAY   No facility-administered encounter medications on file as of 01/20/2018.     Review of Systems  Constitutional: Negative for appetite change and unexpected weight change.  HENT: Negative for congestion and sinus pressure.   Respiratory: Negative for cough, chest tightness and shortness of breath.   Cardiovascular: Negative for chest pain and leg swelling.  Gastrointestinal: Negative for abdominal pain, diarrhea, nausea and vomiting.  Genitourinary: Negative  for difficulty urinating and dysuria.  Musculoskeletal: Negative for joint swelling and myalgias.  Skin: Negative for color change and rash.  Neurological: Negative for dizziness, light-headedness and headaches.  Psychiatric/Behavioral: Negative for agitation and dysphoric mood.       Objective:    Physical Exam Constitutional:      General: She is not in acute distress.    Appearance: She is normal weight.  HENT:     Nose: Nose normal. No congestion.  Neck:     Musculoskeletal: Neck supple. No muscular tenderness.     Thyroid: No thyromegaly.  Cardiovascular:     Rate and Rhythm: Normal rate.     Comments: Irregular rhythm.   Pulmonary:     Effort: No respiratory distress.     Breath sounds: Normal breath sounds. No wheezing.  Abdominal:     General: Bowel sounds are normal.     Palpations: Abdomen is soft.     Tenderness: There is no abdominal tenderness.  Musculoskeletal:        General: No swelling or tenderness.  Lymphadenopathy:     Cervical: No cervical adenopathy.  Skin:    Findings: No erythema or rash.  Psychiatric:        Mood and Affect: Mood normal.        Behavior: Behavior normal.     BP 124/70 (BP Location: Left Arm, Patient Position: Sitting, Cuff Size: Normal)   Pulse 65   Temp 98 F (36.7 C) (Oral)   Resp 18   Wt 219 lb (99.3 kg)   SpO2 96%   BMI 38.79 kg/m  Wt Readings from Last 3 Encounters:  01/20/18 219 lb (99.3 kg)  01/14/18 217 lb (98.4 kg)  11/12/17 218 lb 8 oz (99.1 kg)     Lab Results  Component Value Date   WBC 7.5 10/08/2017   HGB 15.0 10/08/2017   HCT 43.3 10/08/2017   PLT 223.0 10/08/2017   GLUCOSE 109 (H) 01/13/2018   CHOL 147 01/13/2018   TRIG 137.0 01/13/2018   HDL 41.40 01/13/2018   LDLDIRECT 97.0 12/14/2015   LDLCALC 78 01/13/2018   ALT 10 01/13/2018   AST 13 01/13/2018   NA 141 01/13/2018   K 4.0 01/13/2018   CL 105 01/13/2018   CREATININE 0.78 01/13/2018   BUN 13 01/13/2018   CO2 28 01/13/2018   TSH  3.32 09/08/2017   HGBA1C 5.7 01/13/2018   MICROALBUR <0.7 06/11/2015       Assessment &  Plan:   Problem List Items Addressed This Visit    Anxiety    On zoloft. Stable.  Follow.        Atrial fibrillation (HCC)    Off flecainde and metoprolol.  Feels better.  Ankle swelling better.  Rate controlled.  Sees cardiology.        Hyperglycemia    Low carb diet and exercise.  Follow met b and a1c.       Hyperlipidemia    On lipitor.  Low cholesterol diet and exercise.  Follow lipid panel and liver function tests.        Relevant Orders   Hepatic function panel   Hypertension    Blood pressure under good control.  Continue same medication regimen.  Follow pressures.  Follow metabolic panel.        Hypothyroidism    On thyroid replacement.  Follow tsh.       Vitamin D deficiency    Follow vitamin D level.           Einar Pheasant, MD

## 2018-01-20 NOTE — Assessment & Plan Note (Signed)
Blood pressure under good control.  Continue same medication regimen.  Follow pressures.  Follow metabolic panel.   

## 2018-01-20 NOTE — Assessment & Plan Note (Signed)
On thyroid replacement.  Follow tsh.  

## 2018-01-20 NOTE — Assessment & Plan Note (Signed)
Follow vitamin D level.  

## 2018-01-20 NOTE — Assessment & Plan Note (Signed)
Low carb diet and exercise.  Follow met b and a1c.  

## 2018-02-08 ENCOUNTER — Other Ambulatory Visit: Payer: Self-pay | Admitting: Internal Medicine

## 2018-02-09 ENCOUNTER — Other Ambulatory Visit (INDEPENDENT_AMBULATORY_CARE_PROVIDER_SITE_OTHER): Payer: 59

## 2018-02-09 ENCOUNTER — Telehealth: Payer: Self-pay | Admitting: Radiology

## 2018-02-09 DIAGNOSIS — R739 Hyperglycemia, unspecified: Secondary | ICD-10-CM

## 2018-02-09 DIAGNOSIS — I10 Essential (primary) hypertension: Secondary | ICD-10-CM

## 2018-02-09 DIAGNOSIS — E785 Hyperlipidemia, unspecified: Secondary | ICD-10-CM | POA: Diagnosis not present

## 2018-02-09 LAB — HEPATIC FUNCTION PANEL
ALT: 16 U/L (ref 0–35)
AST: 17 U/L (ref 0–37)
Albumin: 4.2 g/dL (ref 3.5–5.2)
Alkaline Phosphatase: 60 U/L (ref 39–117)
BILIRUBIN DIRECT: 0.1 mg/dL (ref 0.0–0.3)
BILIRUBIN TOTAL: 0.9 mg/dL (ref 0.2–1.2)
Total Protein: 6.6 g/dL (ref 6.0–8.3)

## 2018-02-09 NOTE — Addendum Note (Signed)
Addended by: Arby Barrette on: 02/09/2018 09:18 AM   Modules accepted: Orders

## 2018-02-09 NOTE — Telephone Encounter (Signed)
Pt came in for labs today. Upon review of chart, labs were drawn on 12/11. Reviewed pt my chart message where pt was given lab results. Unable to find reason for pt to come back for labs today. Deleted A1C order as it is to soon to run. My apologies as I noticed pt had labs after I had drawn her. Pt did not question why labs were being drawn. Would you like lipid to be cancelled as well?

## 2018-02-09 NOTE — Telephone Encounter (Signed)
Ok thank you. BMET and Lipid Panel cancelled. Hepatic panel left to be ran

## 2018-02-09 NOTE — Addendum Note (Signed)
Addended by: Arby Barrette on: 02/09/2018 01:38 PM   Modules accepted: Orders

## 2018-02-09 NOTE — Telephone Encounter (Signed)
Just need liver panel checked.  Thanks for catching this.

## 2018-02-09 NOTE — Addendum Note (Signed)
Addended by: Arby Barrette on: 02/09/2018 09:30 AM   Modules accepted: Orders

## 2018-02-10 ENCOUNTER — Encounter: Payer: Self-pay | Admitting: Internal Medicine

## 2018-03-01 ENCOUNTER — Other Ambulatory Visit: Payer: Self-pay | Admitting: Internal Medicine

## 2018-05-05 ENCOUNTER — Other Ambulatory Visit: Payer: Self-pay | Admitting: Internal Medicine

## 2018-05-12 ENCOUNTER — Other Ambulatory Visit: Payer: Self-pay

## 2018-05-12 ENCOUNTER — Ambulatory Visit (HOSPITAL_COMMUNITY)
Admission: RE | Admit: 2018-05-12 | Discharge: 2018-05-12 | Disposition: A | Discharge status: Home / Self Care | Payer: 59 | Source: Ambulatory Visit | Attending: Nurse Practitioner | Admitting: Nurse Practitioner

## 2018-05-12 DIAGNOSIS — I482 Chronic atrial fibrillation, unspecified: Secondary | ICD-10-CM

## 2018-05-12 NOTE — Progress Notes (Signed)
Electrophysiology TeleHealth Note   Due to national recommendations of social distancing due to [place] 19, Audio/video telehealth visit is felt to be most appropriate for this patient at this time.  See MyChart message/consent below from today for patient consent regarding telehealth for the [facility].    Date:  05/12/2018   ID:  Bonnie Shaw, DOB October 13, 1950, MRN [id number]  Location: home  Provider location: [address] [place] [place], [place] [address] Evaluation Performed: New patient consult/Follow up  PCP:  Einar Pheasant, MD  Primary Cardiologist:  none Primary Electrophysiologist: Dr. Rayann Heman   VQ:[id number] afib   History of Present Illness: Bonnie Shaw is a 68 y.o. female who presents via audio conferencing for a telehealth visit today.   The patient is referred for f/u regarding her chronic afib  by Dr Rayann Heman.  She is now living in afib and her v rates are well controlled in the 60's-80's, and she is not symptomatic with it.Marland Kitchen Her BP today is 128/70. No further syncope. She is not on any rate control. She is doing well with eliquis, no bleeding issues. She did travel to [place] in January 2020.She took the antiviral malaria drug while traveling. She did get sick on return home with cough, fever, h/a's and fatigue for 2 weeks. She now wonders if she had covid 19.  Today, she denies symptoms of palpitations, chest pain, shortness of breath, orthopnea, PND, lower extremity edema, claudication, dizziness, presyncope, syncope, bleeding, or neurologic sequela. The patient is tolerating medications without difficulties and is otherwise without complaint today.   she denies symptoms of cough, fevers, chills, or new SOB worrisome for COVID 19.     Atrial Fibrillation Risk Factors:  she does not have symptoms or diagnosis of sleep apnea. she is not compliant with CPAP therapy. she does not have a history of rheumatic fever. she does not have a history of alcohol use. The  patient does not have a history of early familial atrial fibrillation or other arrhythmias.  she has a BMI of There is no height or weight on file to calculate BMI.. There were no vitals filed for this visit.  Past Medical History:  Diagnosis Date  . Anxiety   . Cataract   . Complication of anesthesia   . Hyperlipidemia   . Hypertension   . Hyperthyroidism    s/p RAI therapy  . Left atrial enlargement    severely enlarged  . Obesity   . Panic disorder   . Persistent atrial fibrillation   . Snoring    Past Surgical History:  Procedure Laterality Date  . ABDOMINAL HYSTERECTOMY    . CARDIOVERSION N/A 09/11/2017   Procedure: CARDIOVERSION;  Surgeon: Skeet Latch, MD;  Location: [facility];  Service: Cardiovascular;  Laterality: N/A;  . CATARACT EXTRACTION    . KNEE SURGERY     left knee  . THYROID ABLATION  2010     Current Outpatient Medications  Medication Sig Dispense Refill  . atorvastatin (LIPITOR) 20 MG tablet TAKE 1 TABLET BY MOUTH  DAILY 90 tablet 1  . ELIQUIS 5 MG TABS tablet TAKE 1 TABLET BY MOUTH TWICE DAILY 180 tablet 3  . hydrochlorothiazide (HYDRODIURIL) 25 MG tablet TAKE 1 TABLET BY MOUTH  DAILY 90 tablet 0  . levothyroxine (SYNTHROID, LEVOTHROID) 50 MCG tablet TAKE 1 TABLET BY MOUTH  DAILY 90 tablet 1  . potassium chloride (K-DUR,KLOR-CON) 10 MEQ tablet TAKE 1 TABLET BY MOUTH  DAILY 90 tablet 1  .  sertraline (ZOLOFT) 50 MG tablet TAKE 1 TABLET BY MOUTH  EVERY DAY 90 tablet 0   No current facility-administered medications for this encounter.     Allergies:   Ativan [lorazepam]; Anesthesia s-i-60; Meloxicam; and Sulfonamide derivatives   Social History:  The patient  reports that she has never smoked. She has never used smokeless tobacco. She reports that she does not drink alcohol or use drugs.   Family History:  The patient's  family history includes Alzheimer's disease in her father; Cancer in her sister; Ovarian cancer (age of onset: 16) in her  sister; Stroke in her mother.    ROS:  Please see the history of present illness.   All other systems are personally reviewed and negative.   Exam: Na   Recent Labs: 09/08/2017: TSH 3.32 10/08/2017: Hemoglobin 15.0; Platelets 223.0 01/13/2018: BUN 13; Creatinine, Ser 0.78; Potassium 4.0; Sodium 141 02/09/2018: ALT 16  personally reviewed    Other studies personally reviewed: Epic records reviewed     ASSESSMENT AND PLAN:  1. Chronic atrial fibrillation Currently asymptomatic and  rate controlled without BB on board Prior syncope possibly from BB- hypotension/bradycardia Continue  eliquis 5 mg bid   This patients CHA2DS2-VASc Score and unadjusted Ischemic Stroke Rate (% per year) is equal to 3.2 % stroke rate/year from a score of 3  Above score calculated as 1 point each if present [CHF, HTN, DM, Vascular=MI/PAD/Aortic Plaque, Age if 65-74, or Female] Above score calculated as 2 points each if present [Age > 75, or Stroke/TIA/TE]   2. Flu like symptoms in January 2020, after traveling in [place], pt questioning if she had Covid 19  She has her physical in 2 weeks with Dr Nicki Reaper and can discuss if antibody test when available is indicated   Follow-up:  6 months  Current medicines are reviewed at length with the patient today.   The patient does not have concerns regarding her medicines.  The following changes were made today:  none  Labs/ tests ordered today include: none No orders of the defined types were placed in this encounter.   Patient Risk:  after full review of this patients clinical status, I feel that they are at moderate risk at this time.   Today, I have spent  15  minutes with the patient with telehealth technology discussing afib/covid    Signed, Roderic Palau NP 05/12/2018 11:13 AM  [facility] [address] [place], [place] [address] [phone]    hereby voluntarily request, consent and authorize the [facility] and its employed or contracted physicians, physician assistants, nurse practitioners or other licensed health care professionals (the Practitioner), to provide me with telemedicine health care services (the "Services") as deemed necessary by the treating Practitioner. I acknowledge and consent to receive the Services by the Practitioner via telemedicine. I understand that the telemedicine visit will involve communicating with the Practitioner through live audiovisual communication technology and the disclosure of certain medical information by electronic transmission. I acknowledge that I have been given the opportunity to request an in-person assessment or other available alternative prior to the telemedicine visit and am voluntarily participating in the telemedicine visit.   I understand that I have the right to withhold or withdraw my consent to the use of telemedicine in the course of my care at any time, without affecting my right to future care or treatment, and that the Practitioner or I may terminate the telemedicine visit at any time. I understand that  I have the right to inspect all information obtained and/or recorded in the course of the telemedicine visit and may receive copies of available information for a reasonable fee.  I understand that some of the potential risks of receiving the Services via telemedicine include:   Delay or interruption in medical evaluation due to technological equipment failure or disruption;  Information transmitted may not be sufficient (e.g. poor resolution of images) to allow for appropriate medical decision making by the Practitioner; and/or  In rare instances, security protocols could fail, causing a breach of personal health information.   Furthermore, I acknowledge that it is my responsibility to provide information about my medical history, conditions and care that is complete and accurate to the best of my ability. I acknowledge that Practitioner's  advice, recommendations, and/or decision may be based on factors not within their control, such as incomplete or inaccurate data provided by me or distortions of diagnostic images or specimens that may result from electronic transmissions. I understand that the practice of medicine is not an exact science and that Practitioner makes no warranties or guarantees regarding treatment outcomes. I acknowledge that I will receive a copy of this consent concurrently upon execution via email to the email address I last provided but may also request a printed copy by calling the office of the [facility].  I understand that my insurance will be billed for this visit.   I have read or had this consent read to me.  I understand the contents of this consent, which adequately explains the benefits and risks of the Services being provided via telemedicine.  I have been provided ample opportunity to ask questions regarding this consent and the Services and have had my questions answered to my satisfaction.  I give my informed consent for the services to be provided through the use of telemedicine in my medical care  By participating in this telemedicine visit I agree to the above.

## 2018-06-01 ENCOUNTER — Ambulatory Visit (INDEPENDENT_AMBULATORY_CARE_PROVIDER_SITE_OTHER): Payer: 59 | Admitting: Internal Medicine

## 2018-06-01 ENCOUNTER — Encounter: Payer: Self-pay | Admitting: Internal Medicine

## 2018-06-01 ENCOUNTER — Other Ambulatory Visit: Payer: Self-pay

## 2018-06-01 DIAGNOSIS — I4891 Unspecified atrial fibrillation: Secondary | ICD-10-CM

## 2018-06-01 DIAGNOSIS — E785 Hyperlipidemia, unspecified: Secondary | ICD-10-CM

## 2018-06-01 DIAGNOSIS — F419 Anxiety disorder, unspecified: Secondary | ICD-10-CM | POA: Diagnosis not present

## 2018-06-01 DIAGNOSIS — R739 Hyperglycemia, unspecified: Secondary | ICD-10-CM

## 2018-06-01 DIAGNOSIS — E039 Hypothyroidism, unspecified: Secondary | ICD-10-CM

## 2018-06-01 DIAGNOSIS — Z8669 Personal history of other diseases of the nervous system and sense organs: Secondary | ICD-10-CM

## 2018-06-01 DIAGNOSIS — I10 Essential (primary) hypertension: Secondary | ICD-10-CM

## 2018-06-01 NOTE — Progress Notes (Signed)
Patient ID: Bonnie Shaw, female   DOB: 08-27-1950, 68 y.o.   MRN: [id number] Virtual Visit via Telephone Note  This visit type was conducted due to national recommendations for restrictions regarding the COVID-19 pandemic (e.g. social distancing).  This format is felt to be most appropriate for this patient at this time.  All issues noted in this document were discussed and addressed.  No physical exam was performed (except for noted visual exam findings with Video Visits).   I connected with Anivea Velasques on 06/01/18 at  9:00 AM EDT by telephone and verified that I am speaking with the correct person using two identifiers. Location patient: home Location provider: work Persons participating in the telephone visit: patient, provider  I discussed the limitations, risks, security and privacy concerns of performing an evaluation and management service by telephone and the availability of in person appointments.  The patient expressed understanding and agreed to proceed.   Reason for visit: scheduled follow up.    HPI: She reports she is doing well.  Staying active.  Some increased stress related to social distancing.  Has been having to stay away from her grandchildren.  Overall she feels she is handling things relatively well.  On zoloft.  Does not feel she needs anything more at this time.  No chest pain.  No increased heart rate or palpitations.  No acid reflux.  No abdominal pain.  Bowels moving.  In afib.  Followed by cardiology.  Just evaluated 05/12/18.  Pulse rate in the 60-80s.  No changes made.  On eliquis.  Went to [place] in 02/2018.  Was sick for two weeks after she returned.  Was questioning the possibility of COVID.  No symptoms now.  No fever.  No cough or congestion.  No sob.  Blood pressure doing well - averaging 116/70s.  No ankle swelling.  Her husband was exposed to someone at work with [employer].  Self quarantined for two weeks.  Never developed any symptoms.     ROS: See pertinent  positives and negatives per HPI.  Past Medical History:  Diagnosis Date  . Anxiety   . Cataract   . Complication of anesthesia   . Hyperlipidemia   . Hypertension   . Hyperthyroidism    s/p RAI therapy  . Left atrial enlargement    severely enlarged  . Obesity   . Panic disorder   . Persistent atrial fibrillation   . Snoring     Past Surgical History:  Procedure Laterality Date  . ABDOMINAL HYSTERECTOMY    . CARDIOVERSION N/A 09/11/2017   Procedure: CARDIOVERSION;  Surgeon: Skeet Latch, MD;  Location: [facility];  Service: Cardiovascular;  Laterality: N/A;  . CATARACT EXTRACTION    . KNEE SURGERY     left knee  . THYROID ABLATION  2010    Family History  Problem Relation Age of Onset  . Alzheimer's disease Father   . Stroke Mother   . Cancer Sister        ovarian   . Ovarian cancer Sister 64  . Breast cancer Neg Hx     SOCIAL HX: reviewed.    Current Outpatient Medications:  .  atorvastatin (LIPITOR) 20 MG tablet, TAKE 1 TABLET BY MOUTH  DAILY, Disp: 90 tablet, Rfl: 1 .  ELIQUIS 5 MG TABS tablet, TAKE 1 TABLET BY MOUTH TWICE DAILY, Disp: 180 tablet, Rfl: 3 .  hydrochlorothiazide (HYDRODIURIL) 25 MG tablet, TAKE 1 TABLET BY MOUTH  DAILY, Disp: 90 tablet, Rfl: 0 .  levothyroxine (SYNTHROID, LEVOTHROID) 50 MCG tablet, TAKE 1 TABLET BY MOUTH  DAILY, Disp: 90 tablet, Rfl: 1 .  potassium chloride (K-DUR,KLOR-CON) 10 MEQ tablet, TAKE 1 TABLET BY MOUTH  DAILY, Disp: 90 tablet, Rfl: 1 .  sertraline (ZOLOFT) 50 MG tablet, TAKE 1 TABLET BY MOUTH  EVERY DAY, Disp: 90 tablet, Rfl: 0  EXAM:  GENERAL: alert.  Answering questions appropriately.  Sounds to be in no acute distress.    PSYCH/NEURO: pleasant and cooperative, no obvious depression or anxiety, speech and thought processing grossly intact  ASSESSMENT AND PLAN:  Discussed the following assessment and plan:  Anxiety  Atrial fibrillation, unspecified type ([place])  History of Guillain-Barre  syndrome  Hyperglycemia  Hyperlipidemia, unspecified hyperlipidemia type  Essential hypertension  Hypothyroidism, unspecified type  Anxiety Doing well on zoloft.  Stable.    Atrial fibrillation ([facility]) Stable.  Sees cardiology.  Just evaluated.  No changes made.    History of Guillain-Barre syndrome S/p treatment with IVIG.  Has been released by neurology.  Stable.    Hyperglycemia Low carb diet and exercise.  Follow met b and a1c.   Hyperlipidemia On lipitor.  Low cholesterol diet and exercise.  Follow lipid panel and liver function tests.    Hypertension Blood pressure under good control.  Continue same medication regimen.  Follow pressures.  Follow metabolic panel.    Hypothyroidism On thyroid replacement.  Follow tsh.     I discussed the assessment and treatment plan with the patient. The patient was provided an opportunity to ask questions and all were answered. The patient agreed with the plan and demonstrated an understanding of the instructions.   The patient was advised to call back or seek an in-person evaluation if the symptoms worsen or if the condition fails to improve as anticipated.  I provided 15 minutes of non-face-to-face time during this encounter.   Einar Pheasant, MD

## 2018-06-05 ENCOUNTER — Encounter: Payer: Self-pay | Admitting: Internal Medicine

## 2018-06-05 NOTE — Assessment & Plan Note (Signed)
S/p treatment with IVIG.  Has been released by neurology.  Stable.

## 2018-06-05 NOTE — Assessment & Plan Note (Signed)
Doing well on zoloft.  Stable.

## 2018-06-05 NOTE — Assessment & Plan Note (Signed)
Low carb diet and exercise.  Follow met b and a1c.  

## 2018-06-05 NOTE — Assessment & Plan Note (Signed)
Blood pressure under good control.  Continue same medication regimen.  Follow pressures.  Follow metabolic panel.   

## 2018-06-05 NOTE — Assessment & Plan Note (Signed)
On thyroid replacement.  Follow tsh.  

## 2018-06-05 NOTE — Assessment & Plan Note (Signed)
Stable.  Sees cardiology.  Just evaluated.  No changes made.

## 2018-06-05 NOTE — Assessment & Plan Note (Signed)
On lipitor.  Low cholesterol diet and exercise.  Follow lipid panel and liver function tests.   

## 2018-07-12 LAB — FECAL OCCULT BLOOD, IMMUNOCHEMICAL: IFOBT: NEGATIVE

## 2018-07-28 ENCOUNTER — Other Ambulatory Visit: Payer: Self-pay | Admitting: Internal Medicine

## 2018-07-28 NOTE — Telephone Encounter (Signed)
Eliquis 5mg  refill request received; pt is 68 yrs old, wt-99.3kg, Crea-0.78 on 01/13/2018, last seen by Roderic Palau on 05/12/2018; will send in a refill to request pharmacy.

## 2018-07-29 ENCOUNTER — Other Ambulatory Visit: Payer: Self-pay | Admitting: Internal Medicine

## 2018-08-19 ENCOUNTER — Other Ambulatory Visit: Payer: Self-pay | Admitting: Internal Medicine

## 2018-09-20 ENCOUNTER — Other Ambulatory Visit: Payer: Self-pay | Admitting: Internal Medicine

## 2018-09-20 DIAGNOSIS — Z1231 Encounter for screening mammogram for malignant neoplasm of breast: Secondary | ICD-10-CM

## 2018-10-29 ENCOUNTER — Telehealth: Payer: Self-pay | Admitting: *Deleted

## 2018-10-29 DIAGNOSIS — R739 Hyperglycemia, unspecified: Secondary | ICD-10-CM

## 2018-10-29 DIAGNOSIS — E785 Hyperlipidemia, unspecified: Secondary | ICD-10-CM

## 2018-10-29 DIAGNOSIS — E039 Hypothyroidism, unspecified: Secondary | ICD-10-CM

## 2018-10-29 DIAGNOSIS — I10 Essential (primary) hypertension: Secondary | ICD-10-CM

## 2018-10-29 DIAGNOSIS — E559 Vitamin D deficiency, unspecified: Secondary | ICD-10-CM

## 2018-10-29 DIAGNOSIS — I4891 Unspecified atrial fibrillation: Secondary | ICD-10-CM

## 2018-10-29 NOTE — Telephone Encounter (Signed)
Please place future orders for lab appt.  

## 2018-10-30 NOTE — Telephone Encounter (Signed)
Orders placed for labs

## 2018-11-01 ENCOUNTER — Other Ambulatory Visit (INDEPENDENT_AMBULATORY_CARE_PROVIDER_SITE_OTHER): Payer: 59

## 2018-11-01 ENCOUNTER — Other Ambulatory Visit: Payer: Self-pay

## 2018-11-01 DIAGNOSIS — E039 Hypothyroidism, unspecified: Secondary | ICD-10-CM

## 2018-11-01 DIAGNOSIS — E559 Vitamin D deficiency, unspecified: Secondary | ICD-10-CM | POA: Diagnosis not present

## 2018-11-01 DIAGNOSIS — E785 Hyperlipidemia, unspecified: Secondary | ICD-10-CM

## 2018-11-01 DIAGNOSIS — I4891 Unspecified atrial fibrillation: Secondary | ICD-10-CM | POA: Diagnosis not present

## 2018-11-01 DIAGNOSIS — I10 Essential (primary) hypertension: Secondary | ICD-10-CM

## 2018-11-01 DIAGNOSIS — R739 Hyperglycemia, unspecified: Secondary | ICD-10-CM | POA: Diagnosis not present

## 2018-11-01 LAB — CBC WITH DIFFERENTIAL/PLATELET
Basophils Absolute: 0.1 10*3/uL (ref 0.0–0.1)
Basophils Relative: 0.7 % (ref 0.0–3.0)
Eosinophils Absolute: 0.2 10*3/uL (ref 0.0–0.7)
Eosinophils Relative: 2 % (ref 0.0–5.0)
HCT: 42.3 % (ref 36.0–46.0)
Hemoglobin: 14.6 g/dL (ref 12.0–15.0)
Lymphocytes Relative: 43.2 % (ref 12.0–46.0)
Lymphs Abs: 3.9 10*3/uL (ref 0.7–4.0)
MCHC: 34.5 g/dL (ref 30.0–36.0)
MCV: 93.2 fl (ref 78.0–100.0)
Monocytes Absolute: 0.8 10*3/uL (ref 0.1–1.0)
Monocytes Relative: 8.7 % (ref 3.0–12.0)
Neutro Abs: 4.1 10*3/uL (ref 1.4–7.7)
Neutrophils Relative %: 45.4 % (ref 43.0–77.0)
Platelets: 226 10*3/uL (ref 150.0–400.0)
RBC: 4.53 Mil/uL (ref 3.87–5.11)
RDW: 13.2 % (ref 11.5–15.5)
WBC: 9 10*3/uL (ref 4.0–10.5)

## 2018-11-01 LAB — LDL CHOLESTEROL, DIRECT: Direct LDL: 97 mg/dL

## 2018-11-01 LAB — HEPATIC FUNCTION PANEL
ALT: 13 U/L (ref 0–35)
AST: 12 U/L (ref 0–37)
Albumin: 4 g/dL (ref 3.5–5.2)
Alkaline Phosphatase: 55 U/L (ref 39–117)
Bilirubin, Direct: 0.2 mg/dL (ref 0.0–0.3)
Total Bilirubin: 1.2 mg/dL (ref 0.2–1.2)
Total Protein: 6.3 g/dL (ref 6.0–8.3)

## 2018-11-01 LAB — LIPID PANEL
Cholesterol: 160 mg/dL (ref 0–200)
HDL: 39.4 mg/dL (ref >39.00)
NonHDL: 121.01
Total CHOL/HDL Ratio: 4
Triglycerides: 210 mg/dL — ABNORMAL HIGH (ref 0.0–149.0)
VLDL: 42 mg/dL — ABNORMAL HIGH (ref 0.0–40.0)

## 2018-11-01 LAB — BASIC METABOLIC PANEL
BUN: 11 mg/dL (ref 6–23)
CO2: 26 mEq/L (ref 19–32)
Calcium: 9.4 mg/dL (ref 8.4–10.5)
Chloride: 105 mEq/L (ref 96–112)
Creatinine, Ser: 0.8 mg/dL (ref 0.40–1.20)
GFR: 71.1 mL/min (ref >60.00)
Glucose, Bld: 98 mg/dL (ref 70–99)
Potassium: 4 mEq/L (ref 3.5–5.1)
Sodium: 141 mEq/L (ref 135–145)

## 2018-11-01 LAB — TSH: TSH: 3.62 u[IU]/mL (ref 0.35–4.50)

## 2018-11-01 LAB — VITAMIN D 25 HYDROXY (VIT D DEFICIENCY, FRACTURES): VITD: 23.43 ng/mL — ABNORMAL LOW (ref 30.00–100.00)

## 2018-11-01 LAB — HEMOGLOBIN A1C: Hgb A1c MFr Bld: 5.8 % (ref 4.6–6.5)

## 2018-11-02 ENCOUNTER — Encounter: Payer: Self-pay | Admitting: Internal Medicine

## 2018-11-02 DIAGNOSIS — I4891 Unspecified atrial fibrillation: Secondary | ICD-10-CM

## 2018-11-03 ENCOUNTER — Ambulatory Visit (INDEPENDENT_AMBULATORY_CARE_PROVIDER_SITE_OTHER): Payer: 59 | Admitting: Internal Medicine

## 2018-11-03 ENCOUNTER — Other Ambulatory Visit: Payer: Self-pay

## 2018-11-03 VITALS — BP 126/70 | HR 80 | Temp 96.9°F | Resp 16 | Ht 63.0 in | Wt 223.6 lb

## 2018-11-03 DIAGNOSIS — I4891 Unspecified atrial fibrillation: Secondary | ICD-10-CM

## 2018-11-03 DIAGNOSIS — E785 Hyperlipidemia, unspecified: Secondary | ICD-10-CM

## 2018-11-03 DIAGNOSIS — Z1211 Encounter for screening for malignant neoplasm of colon: Secondary | ICD-10-CM | POA: Diagnosis not present

## 2018-11-03 DIAGNOSIS — R739 Hyperglycemia, unspecified: Secondary | ICD-10-CM

## 2018-11-03 DIAGNOSIS — F419 Anxiety disorder, unspecified: Secondary | ICD-10-CM

## 2018-11-03 DIAGNOSIS — Z0000 Encounter for general adult medical examination without abnormal findings: Secondary | ICD-10-CM

## 2018-11-03 DIAGNOSIS — E039 Hypothyroidism, unspecified: Secondary | ICD-10-CM

## 2018-11-03 DIAGNOSIS — Z23 Encounter for immunization: Secondary | ICD-10-CM | POA: Diagnosis not present

## 2018-11-03 DIAGNOSIS — Z8669 Personal history of other diseases of the nervous system and sense organs: Secondary | ICD-10-CM | POA: Diagnosis not present

## 2018-11-03 DIAGNOSIS — E559 Vitamin D deficiency, unspecified: Secondary | ICD-10-CM

## 2018-11-03 DIAGNOSIS — I10 Essential (primary) hypertension: Secondary | ICD-10-CM

## 2018-11-03 NOTE — Assessment & Plan Note (Addendum)
Physical today 11/02/18.  Mammogram scheduled for 11/05/18.  Discussed the need for colonoscopy.  She is in agreement for referral.

## 2018-11-03 NOTE — Progress Notes (Addendum)
Patient ID: Bonnie Shaw, female   DOB: 1950/05/03, 68 y.o.   MRN: [id number]   Subjective:    Patient ID: Bonnie Shaw, female    DOB: 06-Sep-1950, 68 y.o.   MRN: [id number]  HPI  Patient here for her physical exam.  She reports some increased anxiety and stress.  Discussed with her today.  She is on zoloft.  Feels she does well on this medication.  Does not feel needs any further intervention at this time.  No chest pain.  No sob.  No acid reflux.  No abdominal pain.  Bowels moving.  Will occasionally notice some increased palpitations/fluttering.  On eliquis.  she does request to change cardiologist.  Wants to stay in [place] instead of having to drive to [place].  Stays active.  No vaginal symptoms or problems.  Discussed colonoscopy.  She is ok for referral.  Discussed recent labs.     Past Medical History:  Diagnosis Date  . Anxiety   . Cataract   . Complication of anesthesia   . Hyperlipidemia   . Hypertension   . Hyperthyroidism    s/p RAI therapy  . Left atrial enlargement    severely enlarged  . Obesity   . Panic disorder   . Persistent atrial fibrillation ([place])   . Snoring    Past Surgical History:  Procedure Laterality Date  . ABDOMINAL HYSTERECTOMY    . CARDIOVERSION N/A 09/11/2017   Procedure: CARDIOVERSION;  Surgeon: Skeet Latch, MD;  Location: [facility];  Service: Cardiovascular;  Laterality: N/A;  . CATARACT EXTRACTION    . KNEE SURGERY     left knee  . THYROID ABLATION  2010   Family History  Problem Relation Age of Onset  . Alzheimer's disease Father   . Stroke Mother   . Cancer Sister        ovarian   . Ovarian cancer Sister 37  . Breast cancer Neg Hx    Social History   Socioeconomic History  . Marital status: Married    Spouse name: Not on file  . Number of children: 4  . Years of education: Not on file  . Highest education level: Not on file  Occupational History    Employer: UNEMPLOYED  Social Needs  . Financial resource strain:  Not on file  . Food insecurity    Worry: Not on file    Inability: Not on file  . Transportation needs    Medical: Not on file    Non-medical: Not on file  Tobacco Use  . Smoking status: Never Smoker  . Smokeless tobacco: Never Used  Substance and Sexual Activity  . Alcohol use: No  . Drug use: No  . Sexual activity: Not on file  Lifestyle  . Physical activity    Days per week: Not on file    Minutes per session: Not on file  . Stress: Not on file  Relationships  . Social Herbalist on phone: Not on file    Gets together: Not on file    Attends religious service: Not on file    Active member of club or organization: Not on file    Attends meetings of clubs or organizations: Not on file    Relationship status: Not on file  Other Topics Concern  . 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 atorvastatin (LIPITOR) 20 MG tablet TAKE 1 TABLET BY MOUTH  DAILY  . ELIQUIS 5 MG TABS tablet TAKE 1 TABLET BY MOUTH TWICE DAILY  . hydrochlorothiazide (HYDRODIURIL) 25 MG tablet TAKE 1 TABLET BY MOUTH  DAILY  . levothyroxine (SYNTHROID) 50 MCG tablet TAKE 1 TABLET BY MOUTH  DAILY  . potassium chloride (K-DUR) 10 MEQ tablet TAKE 1 TABLET BY MOUTH  DAILY  . sertraline (ZOLOFT) 50 MG tablet TAKE 1 TABLET BY MOUTH  DAILY   No facility-administered encounter medications on file as of 11/03/2018.     Review of Systems  Constitutional: Negative for appetite change and unexpected weight change.  HENT: Negative for congestion and sinus pressure.   Eyes: Negative for pain and visual disturbance.  Respiratory: Negative for cough, chest tightness and shortness of breath.   Cardiovascular: Negative for chest pain, palpitations and  leg swelling.  Gastrointestinal: Negative for abdominal pain, diarrhea, nausea and vomiting.  Genitourinary: Negative for difficulty urinating and dysuria.  Musculoskeletal: Negative for joint swelling and myalgias.  Skin: Negative for color change and rash.  Neurological: Negative for dizziness, light-headedness and headaches.  Hematological: Negative for adenopathy. Does not bruise/bleed easily.  Psychiatric/Behavioral: Negative for agitation and dysphoric mood.       Objective:    Physical Exam Constitutional:      General: She is not in acute distress.    Appearance: Normal appearance. She is well-developed.  HENT:     Head: Normocephalic and atraumatic.     Right Ear: External ear normal.     Left Ear: External ear normal.  Eyes:     General: No scleral icterus.       Right eye: No discharge.        Left eye: No discharge.     Conjunctiva/sclera: Conjunctivae normal.  Neck:     Musculoskeletal: Neck supple. No muscular tenderness.     Thyroid: No thyromegaly.  Cardiovascular:     Rate and Rhythm: Normal rate.     Comments: Rate controlled.  Pulmonary:     Effort: No tachypnea, accessory muscle usage or respiratory distress.     Breath sounds: Normal breath sounds. No decreased breath sounds or wheezing.  Chest:     Breasts:        Right: No inverted nipple, mass, nipple discharge or tenderness (no axillary adenopathy).        Left: No inverted nipple, mass, nipple discharge or tenderness (no axilarry adenopathy).  Abdominal:     General: Bowel sounds are normal.     Palpations: Abdomen is soft.     Tenderness: There is no abdominal tenderness.  Musculoskeletal:        General: No swelling or tenderness.  Lymphadenopathy:     Cervical: No cervical adenopathy.  Skin:    Findings: No erythema or rash.  Neurological:     Mental Status: She is alert and oriented to person, place, and time.  Psychiatric:        Mood and Affect: Mood normal.        Behavior:  Behavior normal.     BP 126/70   Pulse 80   Temp (!) 96.9 F (36.1 C)   Resp 16   Ht _0  (1.6 m)   Wt 223 lb 9.6 oz (101.4  kg)   SpO2 97%   BMI 39.61 kg/m  Wt Readings from Last 3 Encounters:  11/03/18 223 lb 9.6 oz (101.4 kg)  01/20/18 219 lb (99.3 kg)  01/14/18 217 lb (98.4 kg)     Lab Results  Component Value Date   WBC 9.0 11/01/2018   HGB 14.6 11/01/2018   HCT 42.3 11/01/2018   PLT 226.0 11/01/2018   GLUCOSE 98 11/01/2018   CHOL 160 11/01/2018   TRIG 210.0 (H) 11/01/2018   HDL 39.40 11/01/2018   LDLDIRECT 97.0 11/01/2018   LDLCALC 78 01/13/2018   ALT 13 11/01/2018   AST 12 11/01/2018   NA 141 11/01/2018   K 4.0 11/01/2018   CL 105 11/01/2018   CREATININE 0.80 11/01/2018   BUN 11 11/01/2018   CO2 26 11/01/2018   TSH 3.62 11/01/2018   HGBA1C 5.8 11/01/2018   MICROALBUR <0.7 06/11/2015       Assessment & Plan:   Problem List Items Addressed This Visit    Anxiety    Discussed with her today.  Overall she feels she is coping relatively well.  Continue zoloft.  She does not feel needs any further intervention at this time.  Follow.        Atrial fibrillation ([place])    Overall she feels is stable. On eliquis.  EKG - afib.  Rated controlled.  No acute ischemic changes.  Sees cardiology.  Wants to change cardiologist to local cardiologist.  Request to establish with Dr Rockey Situ.  Desires not to have to drive to [place].        Relevant Orders   EKG 12-Lead (Completed)   Ambulatory referral to Cardiology   Health care maintenance    Physical today 11/02/18.  Mammogram scheduled for 11/05/18.  Discussed the need for colonoscopy.  She is in agreement for referral.        History of Guillain-Barre syndrome    S/p treatment with IVIG.  Released by neurology.  Stable.       Hyperglycemia    Low carb diet and exercise.  Follow met b and a1c.       Relevant Orders   Hemoglobin A1c   Hyperlipidemia    Discussed diet and exercise.  Follow lipid panel and liver  function tests.  On lipitor.        Relevant Orders   Hepatic function panel   Lipid panel   Hypertension    Blood pressure under good control.  Continue same medication regimen.  Follow pressures.  Follow metabolic panel.        Relevant Orders   Basic metabolic panel   Hypothyroidism    On thyroid replacement.  Follow tsh.        Vitamin D deficiency    Follow vitamin d level.        Other Visit Diagnoses    Routine general medical examination at a health care facility    -  Primary   Need for immunization against influenza       Relevant Orders   Flu Vaccine QUAD High Dose(Fluad) (Completed)   Colon cancer screening       Relevant Orders   Ambulatory referral to Gastroenterology       Einar Pheasant, MD

## 2018-11-04 NOTE — Telephone Encounter (Signed)
Order placed for cardiology referral.   

## 2018-11-05 ENCOUNTER — Ambulatory Visit
Admission: RE | Admit: 2018-11-05 | Discharge: 2018-11-05 | Disposition: A | Discharge status: Home / Self Care | Payer: 59 | Source: Ambulatory Visit | Attending: Internal Medicine | Admitting: Internal Medicine

## 2018-11-05 DIAGNOSIS — Z1231 Encounter for screening mammogram for malignant neoplasm of breast: Secondary | ICD-10-CM

## 2018-11-07 ENCOUNTER — Encounter: Payer: Self-pay | Admitting: Internal Medicine

## 2018-11-07 NOTE — Assessment & Plan Note (Signed)
On thyroid replacement.  Follow tsh.  

## 2018-11-07 NOTE — Assessment & Plan Note (Signed)
Discussed with her today.  Overall she feels she is coping relatively well.  Continue zoloft.  She does not feel needs any further intervention at this time.  Follow.

## 2018-11-07 NOTE — Assessment & Plan Note (Signed)
Low carb diet and exercise.  Follow met b and a1c.  

## 2018-11-07 NOTE — Assessment & Plan Note (Signed)
Discussed diet and exercise.  Follow lipid panel and liver function tests.  On lipitor.

## 2018-11-07 NOTE — Assessment & Plan Note (Signed)
Follow vitamin d level.   

## 2018-11-07 NOTE — Assessment & Plan Note (Addendum)
Overall she feels is stable. On eliquis.  EKG - afib.  Rated controlled.  No acute ischemic changes.  Sees cardiology.  Wants to change cardiologist to local cardiologist.  Request to establish with Dr Rockey Situ.  Desires not to have to drive to [place].

## 2018-11-07 NOTE — Assessment & Plan Note (Signed)
Blood pressure under good control.  Continue same medication regimen.  Follow pressures.  Follow metabolic panel.   

## 2018-11-07 NOTE — Assessment & Plan Note (Signed)
S/p treatment with IVIG.  Released by neurology.  Stable.

## 2018-11-08 ENCOUNTER — Other Ambulatory Visit: Payer: Self-pay

## 2018-11-08 ENCOUNTER — Telehealth: Payer: Self-pay

## 2018-11-08 DIAGNOSIS — Z1211 Encounter for screening for malignant neoplasm of colon: Secondary | ICD-10-CM

## 2018-11-08 NOTE — Telephone Encounter (Signed)
Gastroenterology Pre-Procedure Review  Request Date: 12/21/18 Requesting Physician: Dr. Vicente Males  PATIENT REVIEW QUESTIONS: The patient responded to the following health history questions as indicated:    1. Are you having any GI issues? no 2. Do you have a personal history of Polyps? no 3. Do you have a family history of Colon Cancer or Polyps? yes (sister colon polyps) 4. Diabetes Mellitus? no 5. Joint replacements in the past 12 months?no 6. Major health problems in the past 3 months?Patient has AFIB Cardiac Clearance to be sent to Dr. Jackalyn Lombard  7. Any artificial heart valves, MVP, or defibrillator?no    MEDICATIONS & ALLERGIES:    Patient reports the following regarding taking any anticoagulation/antiplatelet therapy:   Plavix, Coumadin, Eliquis, Xarelto, Lovenox, Pradaxa, Brilinta, or Effient? yes (Eliquis Blood Thinner Request being sent to Dr. Rayann Heman) Aspirin? no  Patient confirms/reports the following medications:  Current Outpatient Medications  Medication Sig Dispense Refill  . atorvastatin (LIPITOR) 20 MG tablet TAKE 1 TABLET BY MOUTH  DAILY 90 tablet 1  . ELIQUIS 5 MG TABS tablet TAKE 1 TABLET BY MOUTH TWICE DAILY 180 tablet 2  . hydrochlorothiazide (HYDRODIURIL) 25 MG tablet TAKE 1 TABLET BY MOUTH  DAILY 90 tablet 2  . levothyroxine (SYNTHROID) 50 MCG tablet TAKE 1 TABLET BY MOUTH  DAILY 90 tablet 1  . potassium chloride (K-DUR) 10 MEQ tablet TAKE 1 TABLET BY MOUTH  DAILY 90 tablet 1  . sertraline (ZOLOFT) 50 MG tablet TAKE 1 TABLET BY MOUTH  DAILY 90 tablet 2   No current facility-administered medications for this visit.     Patient confirms/reports the following allergies:  Allergies  Allergen Reactions  . Ativan [Lorazepam] Other (See Comments)    High sensitivity, cause pt to pass out  . Anesthesia S-I-60 Other (See Comments)    Pt states that she woke up early during her last colonoscopy and knee surgery  . Meloxicam Other (See Comments)    Hallucinations  .  Sulfonamide Derivatives Rash    No orders of the defined types were placed in this encounter.   AUTHORIZATION INFORMATION Primary Insurance: 1D#: Group #:  Secondary Insurance: 1D#: Group #:  SCHEDULE INFORMATION: Date: 12/21/18 Time: Location:[facility]

## 2018-11-09 ENCOUNTER — Telehealth: Payer: Self-pay

## 2018-11-09 NOTE — Telephone Encounter (Signed)
Pt takes Eliquis for afib with CHADS2VASc score of 3 (age, sex, HTN). Renal function is normal. Ok to hold Eliquis for 1-2 days prior to procedure.

## 2018-11-09 NOTE — Telephone Encounter (Signed)
   [facility] Pre-operative Risk Assessment    Request for surgical clearance:  1. What type of surgery is being performed? COLONOSCOPY   2. When is this surgery scheduled? 12/21/18   3. What type of clearance is required (medical clearance vs. Pharmacy clearance to hold med vs. Both)? BOTH  4. Are there any medications that need to be held prior to surgery and how long?  No Name name and name of physician performing surgery? Garland ANNA  5. What is your office phone number [phone]   7.   What is your office fax number [phone]  8.   Anesthesia type (None, local, MAC, general) ? NONE LISTED   Jacinta Shoe 11/09/2018, 3:28 PM  _________________________________________________________________   (provider comments below)

## 2018-11-09 NOTE — Telephone Encounter (Signed)
   Primary Cardiologist: Thompson Grayer, MD  Chart reviewed as part of pre-operative protocol coverage.  Bonnie Shaw was last seen on 05/12/2018 by Roderic Palau, NP-C.  At that time, she was doing well from a cardiac perspective. She denied palpitations or SOB. She has previously deferred antiarrythmic therapy, did not tolerate beta blockers and was not thought to be an ablation candidate in the past.   Pt takes Eliquis for afib with CHADS2VASc score of 3 (age, sex, HTN). Renal function is normal. Ok to hold Eliquis for 1-2 days prior to procedure.  Therefore, based on ACC/AHA guidelines, the patient would be at acceptable risk for the planned procedure without further cardiovascular testing.   I will route this recommendation to the requesting party via Epic fax function and remove from pre-op pool.  Please call with questions.  Kathyrn Drown, NP 11/09/2018, 4:44 PM

## 2018-11-16 ENCOUNTER — Telehealth: Payer: Self-pay | Admitting: Gastroenterology

## 2018-11-16 NOTE — Telephone Encounter (Signed)
Pt is calling to cancel her procedure  Please call pt when this is done she  states she had left a message prior

## 2018-11-16 NOTE — Telephone Encounter (Signed)
Patient called office to confirm that her procedure has been canceled. Informed her that I will cancel her procedure with Endo.  She said she will call office to reschedule next year.  Wannetta Sender has been informed of cancellation.  Thanks [employer]

## 2018-11-18 ENCOUNTER — Encounter (HOSPITAL_COMMUNITY): Payer: Self-pay | Admitting: Nurse Practitioner

## 2018-11-18 ENCOUNTER — Ambulatory Visit (HOSPITAL_COMMUNITY)
Admission: RE | Admit: 2018-11-18 | Discharge: 2018-11-18 | Disposition: A | Discharge status: Home / Self Care | Payer: Medicare Other | Source: Ambulatory Visit | Attending: Nurse Practitioner | Admitting: Nurse Practitioner

## 2018-11-18 ENCOUNTER — Other Ambulatory Visit: Payer: Self-pay

## 2018-11-18 VITALS — BP 136/86 | HR 90 | Ht 63.0 in | Wt 226.6 lb

## 2018-11-18 DIAGNOSIS — I4821 Permanent atrial fibrillation: Secondary | ICD-10-CM | POA: Insufficient documentation

## 2018-11-18 DIAGNOSIS — I4819 Other persistent atrial fibrillation: Secondary | ICD-10-CM

## 2018-11-18 NOTE — Progress Notes (Signed)
Patient ID: Bonnie Shaw, female   DOB: 12-25-1950, 68 y.o.   MRN: [id number]     Primary Care Physician: Einar Pheasant, MD Referring Physician: Dr. Donnie Mesa is a 68 y.o. female with a h/o PAF that was started on flecainide 50 mg bid by Dr. Rayann Heman latter part of June 2017.  She has not had any further afib and doing well tolerating flecainide. HR is usually in 50's at home, tolerating with minimal dizziness. She has had a sleep study and was negative. Echo showed enlargement of left atrium of 50 mm. Despite this, she has done well with minimum breakthrough afib.  She returns to afib clinic 07/29/16. She reports that she had a persistent URI and then started developing neuro symptoms of ataxia and change in sensation with touch. She was admitted to [facility], could not walk the next am and was later transferred to [facility] center for possible [place]ounds which spinal tap confirmed and pt was treat with immunoglobulin  therapy.She was sent to SNF, rehabilitated  quickly and was d/c after 5 days. She did not have any issues with afib while there.  F/u in afib clinic 02/25/17, on last visit she saw Dr. Rayann Heman in December, BB was reduced for bradycardia. Since then she has noted a few episodes of afib, longest x one hour and HR was less than one hundred. She states that she lets her anxiety get the best of her and was afraid these short breakthrough episodes may damage her heart. HR is trending in the 60's with reduction of BB.   F/u in afib clinic, 09/01/17 pt states she feels well but she is in rate controlled afib. She is unaware. Otherwise. No issues  Pt returns to clinic 09/08/17 and ekg confirms that pt is still in afib. She is tolerating well. Discussed increasing flecainide to 100 mg bid and scheduling for cardioversion and she is in agreement.   F/u after cardioversion, 8/12. Unfortunately, it only lasted one day. This time, she was aware that she returned to afib by the use of a  [place]. She really is not that symptomatic. It was discussed at visit to see Dr. Rayann Heman to see if she would be an candidate for  ablation vrs change in antiarrythmic or living in afib as she states that she feels well. I worry about her left atrium size of 56 mm to maintain SR.   F/u 8/28, in afib clinic, for ER visit. She was in church this past Sunday and developed dizziness/pre syncope.  Her son in law who is a PA checked her pulse and it was 45 and regular. She wanted to come to the ER and be evaluated. She was asymptomatic and in rate controlled afib on arrival. Labs were unremarkable. Cardiology was consulted and she was thought to be ok to go home.She however, with her anxiety, did not want to go home and was kept overnight at her insistence/AMA. She was told that she would need a monitor as an outpatient. Metoprolol was stopped. In the office, she is rate controlled but has been super anxious ever since she was told she was in afib.  No further spells.   F/u in afib clinic, 01/14/18. She has chosen to live in  afib. She is tolerating well and has no complaints. Her v rates are elevated in the clinic but at home, the v rates are in the 70's and 80's.  F/u in afib clinic, 11/18/18. She remains in  rate controlled afib and for the most part does not appreciate afib day to day. She has no issues with anticoagulation. No further syncope.   Today, she denies symptoms of palpitations, chest pain, shortness of breath, orthopnea, PND, lower extremity edema, dizziness, presyncope, syncope, or neurologic sequela. The patient is tolerating medications without difficulties and is otherwise without complaint today.   Past Medical History:  Diagnosis Date  . Anxiety   . Cataract   . Complication of anesthesia   . Hyperlipidemia   . Hypertension   . Hyperthyroidism    s/p RAI therapy  . Left atrial enlargement    severely enlarged  . Obesity   . Panic disorder   . Persistent atrial fibrillation ([place])    . Snoring    Past Surgical History:  Procedure Laterality Date  . ABDOMINAL HYSTERECTOMY    . CARDIOVERSION N/A 09/11/2017   Procedure: CARDIOVERSION;  Surgeon: Skeet Latch, MD;  Location: [facility];  Service: Cardiovascular;  Laterality: N/A;  . CATARACT EXTRACTION    . KNEE SURGERY     left knee  . THYROID ABLATION  2010    Current Outpatient Medications  Medication Sig Dispense Refill  . atorvastatin (LIPITOR) 20 MG tablet TAKE 1 TABLET BY MOUTH  DAILY 90 tablet 1  . cholecalciferol (VITAMIN D3) 25 MCG (1000 UT) tablet Take 1,000 Units by mouth daily. Taking 3,000mg  daily    . ELIQUIS 5 MG TABS tablet TAKE 1 TABLET BY MOUTH TWICE DAILY 180 tablet 2  . hydrochlorothiazide (HYDRODIURIL) 25 MG tablet TAKE 1 TABLET BY MOUTH  DAILY 90 tablet 2  . levothyroxine (SYNTHROID) 50 MCG tablet TAKE 1 TABLET BY MOUTH  DAILY 90 tablet 1  . potassium chloride (K-DUR) 10 MEQ tablet TAKE 1 TABLET BY MOUTH  DAILY 90 tablet 1  . sertraline (ZOLOFT) 50 MG tablet TAKE 1 TABLET BY MOUTH  DAILY 90 tablet 2   No current facility-administered medications for this encounter.     Allergies  Allergen Reactions  . Ativan [Lorazepam] Other (See Comments)    High sensitivity, cause pt to pass out  . Anesthesia S-I-60 Other (See Comments)    Pt states that she woke up early during her last colonoscopy and knee surgery  . Meloxicam Other (See Comments)    Hallucinations  . Sulfonamide Derivatives Rash    Social History   Socioeconomic History  . Marital status: Married    Spouse name: Not on file  . Number of children: 4  . Years of education: Not on file  . Highest education level: Not on file  Occupational History    Employer: UNEMPLOYED  Social Needs  . Financial resource strain: Not on file  . Food insecurity    Worry: Not on file    Inability: Not on file  . Transportation needs    Medical: Not on file    Non-medical: Not on file  Tobacco Use  . Smoking status: Never Smoker  .  Smokeless tobacco: Never Used  Substance and Sexual Activity  . Alcohol use: No  . Drug use: No  . Sexual activity: Not on file  Lifestyle  . Physical activity    Days per week: Not on file    Minutes per session: Not on file  . Stress: Not on file  Relationships  . Social Herbalist on phone: Not on file    Gets together: Not on file    Attends religious service: Not on file  Active member of club or organization: Not on file    Attends meetings of clubs or organizations: Not on file    Relationship status: Not on file  . Intimate partner violence    Fear of current or ex partner: Not on file    Emotionally abused: Not on file    Physically abused: Not on file    Forced sexual activity: Not on file  Other Topics Concern  . 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 Alzheimer's disease Father   . Stroke Mother   . Cancer Sister        ovarian   . Ovarian cancer Sister 14  . Breast cancer Neg Hx     ROS- All systems are reviewed and negative except as per the HPI above  Physical Exam: Vitals:   11/18/18 1100  BP: 136/86  Pulse: 90  Weight: 102.8 kg  Height: 5\' 3"  (1.6 m)    GEN- The patient is well appearing, alert and oriented x 3 today.   Head- normocephalic, atraumatic Eyes-  Sclera clear, conjunctiva pink Ears- hearing intact Oropharynx- clear Neck- supple, no JVP Lymph- no cervical lymphadenopathy Lungs- Clear to ausculation bilaterally, normal work of breathing Heart- irregular rate and rhythm, no murmurs, rubs or gallops, PMI not laterally displaced GI- soft, NT, ND, + BS Extremities- no clubbing, cyanosis, or edema MS- no significant deformity or atrophy Skin- no rash or lesion Psych- euthymic mood,  full affect Neuro- strength and sensation are intact  EKG- afib at 1116 bpm, qrs int 66 ms, qtc 478 ms Epic records reviewed Echo -8/19-Study Conclusions  - Left ventricle: The cavity size was normal. There was mild focal   basal hypertrophy of the septum. Systolic function was normal.   The estimated ejection fraction was in the range of 55% to 60%.   Wall motion was normal; there were no regional wall motion   abnormalities. Features are consistent with a pseudonormal left   ventricular filling pattern, with concomitant abnormal relaxation   and increased filling pressure (grade 2 diastolic dysfunction). - Mitral valve: Calcified annulus. Mildly thickened leaflets .   There was mild regurgitation. - Left atrium: The atrium was severely dilated. Volume/bsa, S: 59.6   ml/m^2.56 mm - Tricuspid valve: There was trivial regurgitation.  Assessment and Plan: 1. Permanent  afib She remians rate controlled without any rate control on board BB was stopped in the hospital after a  near  syncopal episode in  2019  Now off flecainide as well  She is clear in her decision that she does not want any rate control meds nor further attempts to restore SR She was felt not to be a candidate for ablation 2/2  size of left atrium and paucity of symptoms  Continue on eliquis 5 mg bid for CHA2DS2VASc score of 3  2. HTN Stable   F/U 9 months   Butch Penny C. , [facility] [address] [place], [place] [address] [phone]

## 2018-11-23 ENCOUNTER — Ambulatory Visit: Payer: 59 | Admitting: Cardiovascular Disease

## 2018-12-21 ENCOUNTER — Ambulatory Visit: Admit: 2018-12-21 | Payer: Medicare Other | Admitting: Gastroenterology

## 2018-12-21 SURGERY — COLONOSCOPY WITH PROPOFOL
Anesthesia: General

## 2019-02-01 ENCOUNTER — Other Ambulatory Visit: Payer: Self-pay | Admitting: Internal Medicine

## 2019-02-11 ENCOUNTER — Other Ambulatory Visit (INDEPENDENT_AMBULATORY_CARE_PROVIDER_SITE_OTHER): Payer: 59

## 2019-02-11 ENCOUNTER — Other Ambulatory Visit: Payer: Self-pay

## 2019-02-11 DIAGNOSIS — I10 Essential (primary) hypertension: Secondary | ICD-10-CM

## 2019-02-11 DIAGNOSIS — E785 Hyperlipidemia, unspecified: Secondary | ICD-10-CM

## 2019-02-11 DIAGNOSIS — R739 Hyperglycemia, unspecified: Secondary | ICD-10-CM | POA: Diagnosis not present

## 2019-02-11 LAB — LIPID PANEL
Cholesterol: 171 mg/dL (ref 0–200)
HDL: 41 mg/dL (ref >39.00)
LDL Cholesterol: 95 mg/dL (ref 0–99)
NonHDL: 130.42
Total CHOL/HDL Ratio: 4
Triglycerides: 179 mg/dL — ABNORMAL HIGH (ref 0.0–149.0)
VLDL: 35.8 mg/dL (ref 0.0–40.0)

## 2019-02-11 LAB — HEPATIC FUNCTION PANEL
ALT: 15 U/L (ref 0–35)
AST: 14 U/L (ref 0–37)
Albumin: 4.1 g/dL (ref 3.5–5.2)
Alkaline Phosphatase: 56 U/L (ref 39–117)
Bilirubin, Direct: 0.2 mg/dL (ref 0.0–0.3)
Total Bilirubin: 1.1 mg/dL (ref 0.2–1.2)
Total Protein: 6.6 g/dL (ref 6.0–8.3)

## 2019-02-11 LAB — BASIC METABOLIC PANEL
BUN: 14 mg/dL (ref 6–23)
CO2: 25 mEq/L (ref 19–32)
Calcium: 9.2 mg/dL (ref 8.4–10.5)
Chloride: 106 mEq/L (ref 96–112)
Creatinine, Ser: 0.73 mg/dL (ref 0.40–1.20)
GFR: 78.96 mL/min (ref >60.00)
Glucose, Bld: 114 mg/dL — ABNORMAL HIGH (ref 70–99)
Potassium: 3.8 mEq/L (ref 3.5–5.1)
Sodium: 139 mEq/L (ref 135–145)

## 2019-02-11 LAB — HEMOGLOBIN A1C: Hgb A1c MFr Bld: 5.8 % (ref 4.6–6.5)

## 2019-02-15 ENCOUNTER — Other Ambulatory Visit: Payer: Self-pay

## 2019-02-15 ENCOUNTER — Ambulatory Visit (INDEPENDENT_AMBULATORY_CARE_PROVIDER_SITE_OTHER): Payer: 59 | Admitting: Internal Medicine

## 2019-02-15 ENCOUNTER — Encounter: Payer: Self-pay | Admitting: Internal Medicine

## 2019-02-15 VITALS — BP 140/80 | HR 70 | Ht 63.0 in | Wt 221.0 lb

## 2019-02-15 DIAGNOSIS — F419 Anxiety disorder, unspecified: Secondary | ICD-10-CM

## 2019-02-15 DIAGNOSIS — I4819 Other persistent atrial fibrillation: Secondary | ICD-10-CM

## 2019-02-15 DIAGNOSIS — I10 Essential (primary) hypertension: Secondary | ICD-10-CM | POA: Diagnosis not present

## 2019-02-15 DIAGNOSIS — E559 Vitamin D deficiency, unspecified: Secondary | ICD-10-CM

## 2019-02-15 DIAGNOSIS — E785 Hyperlipidemia, unspecified: Secondary | ICD-10-CM

## 2019-02-15 DIAGNOSIS — R739 Hyperglycemia, unspecified: Secondary | ICD-10-CM

## 2019-02-15 DIAGNOSIS — E039 Hypothyroidism, unspecified: Secondary | ICD-10-CM

## 2019-02-15 DIAGNOSIS — Z8669 Personal history of other diseases of the nervous system and sense organs: Secondary | ICD-10-CM

## 2019-02-15 DIAGNOSIS — Z1211 Encounter for screening for malignant neoplasm of colon: Secondary | ICD-10-CM

## 2019-02-15 NOTE — Progress Notes (Signed)
Patient ID: Bonnie Shaw, female   DOB: [birth year], 69 y.o.   MRN: [id number]   Virtual Visit via telephone Note  This visit type was conducted due to national recommendations for restrictions regarding the COVID-19 pandemic (e.g. social distancing).  This format is felt to be most appropriate for this patient at this time.  All issues noted in this document were discussed and addressed.  No physical exam was performed (except for noted visual exam findings with Video Visits).   I connected with Bonnie Shaw by telephone and verified that I am speaking with the correct person using two identifiers. Location patient: home Location provider: work  Persons participating in the virtual visit: patient, provider  The limitations, risks, security and privacy concerns of performing an evaluation and management service by telephone and the availability of in person appointments have been discussed.   The patient expressed understanding and agreed to proceed.   Reason for visit: scheduled follow up.    HPI: She reports she is doing well.  Feels good.  Staying active.  Stress better.  States she stopped watching TV.  Has been more creative.  They are working on restoring a chicken coup and transforming it into a gathering place.  No chest pain.  No increased heart rate.  When checks pulse - irregular - rate 70.  No acid reflux or abdominal pain reported.  No bowel change reported.  Blood pressures averaging 120/70s.  Taking zoloft and doing well on this medication.  Had previously wanted to hold on GI referral until after first of the year. Ready for referral now.  Taking vitamin D.  Energy better.     ROS: See pertinent positives and negatives per HPI.  Past Medical History:  Diagnosis Date  . Anxiety   . Cataract   . Complication of anesthesia   . Hyperlipidemia   . Hypertension   . Hyperthyroidism    s/p RAI therapy  . Left atrial enlargement    severely enlarged  . Obesity   . Panic  disorder   . Persistent atrial fibrillation ([place])   . Snoring     Past Surgical History:  Procedure Laterality Date  . ABDOMINAL HYSTERECTOMY    . CARDIOVERSION N/A 09/11/2017   Procedure: CARDIOVERSION;  Surgeon: Skeet Latch, MD;  Location: [facility];  Service: Cardiovascular;  Laterality: N/A;  . CATARACT EXTRACTION    . KNEE SURGERY     left knee  . THYROID ABLATION  2010    Family History  Problem Relation Age of Onset  . Alzheimer's disease Father   . Stroke Mother   . Cancer Sister        ovarian   . Ovarian cancer Sister 46  . Breast cancer Neg Hx     SOCIAL HX: reviewed.    Current Outpatient Medications:  .  atorvastatin (LIPITOR) 20 MG tablet, TAKE 1 TABLET BY MOUTH  DAILY, Disp: 90 tablet, Rfl: 3 .  cholecalciferol (VITAMIN D3) 25 MCG (1000 UT) tablet, Take 1,000 Units by mouth daily. Taking 3,061m daily, Disp: , Rfl:  .  ELIQUIS 5 MG TABS tablet, TAKE 1 TABLET BY MOUTH TWICE DAILY, Disp: 180 tablet, Rfl: 2 .  hydrochlorothiazide (HYDRODIURIL) 25 MG tablet, TAKE 1 TABLET BY MOUTH  DAILY, Disp: 90 tablet, Rfl: 2 .  levothyroxine (SYNTHROID) 50 MCG tablet, TAKE 1 TABLET BY MOUTH  DAILY, Disp: 90 tablet, Rfl: 3 .  potassium chloride (KLOR-CON) 10 MEQ tablet, TAKE 1 TABLET BY MOUTH  DAILY,  Disp: 90 tablet, Rfl: 3 .  sertraline (ZOLOFT) 50 MG tablet, TAKE 1 TABLET BY MOUTH  DAILY, Disp: 90 tablet, Rfl: 2  EXAM:  GENERAL: alert, oriented, appears well and in no acute distress  HEENT: atraumatic, conjunttiva clear, no obvious abnormalities on inspection of external nose and ears  NECK: normal movements of the head and neck  LUNGS: on inspection no signs of respiratory distress, breathing rate appears normal, no obvious gross SOB, gasping or wheezing  CV: no obvious cyanosis  PSYCH/NEURO: pleasant and cooperative, no obvious depression or anxiety, speech and thought processing grossly intact  ASSESSMENT AND PLAN:  Discussed the following assessment and  plan:  Atrial fibrillation ([place]) On eliquis.  Overall feels things are stable.  Followed by cardiology.  No chest pain.  No sob.    Anxiety Doing well on zoloft.  Follow.   History of Guillain-Barre syndrome S/p treatment with IVIG.  Released by neurology.  Stable.   Hyperglycemia Low carb diet and exercise.  Follow met b and a1c.   Hyperlipidemia On lipitor.  Low cholesterol diet and exercise.  Follow lipid panel and liver function tests.    Hypertension Blood pressure under good control.  Continue same medication regimen.  Follow pressures.  Follow metabolic panel.    Hypothyroidism On thyroid replacement.  Follow tsh.    Vitamin D deficiency Follow vitamin D level.    Colon cancer screening Refer to GI for screening colonoscopy.     Orders Placed This Encounter  Procedures  . Hemoglobin A1c    Standing Status:   Future    Standing Expiration Date:   02/15/2020  . Hepatic function panel    Standing Status:   Future    Standing Expiration Date:   02/15/2020  . Lipid panel    Standing Status:   Future    Standing Expiration Date:   02/15/2020  . Basic metabolic panel (future)    Standing Status:   Future    Standing Expiration Date:   02/15/2020  . Ambulatory referral to Gastroenterology    Referral Priority:   Routine    Referral Type:   Consultation    Referral Reason:   Specialty Services Required    Number of Visits Requested:   1     I discussed the assessment and treatment plan with the patient. The patient was provided an opportunity to ask questions and all were answered. The patient agreed with the plan and demonstrated an understanding of the instructions.   The patient was advised to call back or seek an in-person evaluation if the symptoms worsen or if the condition fails to improve as anticipated.  I spent 21 minutes of non face to face time with patient during this visit.   Einar Pheasant, MD

## 2019-02-20 ENCOUNTER — Encounter: Payer: Self-pay | Admitting: Internal Medicine

## 2019-02-20 DIAGNOSIS — Z1211 Encounter for screening for malignant neoplasm of colon: Secondary | ICD-10-CM | POA: Insufficient documentation

## 2019-02-20 NOTE — Assessment & Plan Note (Signed)
On eliquis.  Overall feels things are stable.  Followed by cardiology.  No chest pain.  No sob.

## 2019-02-20 NOTE — Assessment & Plan Note (Signed)
S/p treatment with IVIG.  Released by neurology.  Stable.

## 2019-02-20 NOTE — Assessment & Plan Note (Signed)
On thyroid replacement.  Follow tsh.  

## 2019-02-20 NOTE — Assessment & Plan Note (Signed)
Blood pressure under good control.  Continue same medication regimen.  Follow pressures.  Follow metabolic panel.   

## 2019-02-20 NOTE — Assessment & Plan Note (Signed)
Low carb diet and exercise.  Follow met b and a1c.  

## 2019-02-20 NOTE — Assessment & Plan Note (Signed)
Follow vitamin D level.  

## 2019-02-20 NOTE — Assessment & Plan Note (Signed)
Refer to GI for screening colonoscopy 

## 2019-02-20 NOTE — Assessment & Plan Note (Signed)
Doing well on zoloft.  Follow.   

## 2019-02-20 NOTE — Assessment & Plan Note (Signed)
On lipitor.  Low cholesterol diet and exercise.  Follow lipid panel and liver function tests.   

## 2019-02-21 ENCOUNTER — Other Ambulatory Visit: Payer: Self-pay

## 2019-02-21 ENCOUNTER — Telehealth: Payer: Self-pay

## 2019-02-21 DIAGNOSIS — Z1211 Encounter for screening for malignant neoplasm of colon: Secondary | ICD-10-CM

## 2019-02-21 MED ORDER — NA SULFATE-K SULFATE-MG SULF 17.5-3.13-1.6 GM/177ML PO SOLN: 354.0000 mL | Freq: Once | ORAL | 0 refills | Status: AC

## 2019-02-21 NOTE — Telephone Encounter (Signed)
Per provider hold Eliquis for 1-2 days prior to procedure. Then restart it one day after procedure.  Called and left a message for call back to inform patient.

## 2019-02-21 NOTE — Telephone Encounter (Signed)
Sent mychart message

## 2019-02-21 NOTE — Telephone Encounter (Signed)
Gastroenterology Pre-Procedure Review    PATIENT REVIEW QUESTIONS: The patient responded to the following health history questions as indicated:    1. Are you having any GI issues? no 2. Do you have a personal history of Polyps? no 3. Do you have a family history of Colon Cancer or Polyps? Older sister colon polyps  4. Diabetes Mellitus? no 5. Joint replacements in the past 12 months?no 6. Major health problems in the past 3 months?no 7. Any artificial heart valves, MVP, or defibrillator?no    MEDICATIONS & ALLERGIES:    Patient reports the following regarding taking any anticoagulation/antiplatelet therapy:   Plavix, Coumadin, Eliquis, Xarelto, Lovenox, Pradaxa, Brilinta, or Effient? Yes Eliquis for Afibb  Aspirin? no  Patient confirms/reports the following medications:  Current Outpatient Medications  Medication Sig Dispense Refill  . atorvastatin (LIPITOR) 20 MG tablet TAKE 1 TABLET BY MOUTH  DAILY 90 tablet 3  . cholecalciferol (VITAMIN D3) 25 MCG (1000 UT) tablet Take 1,000 Units by mouth daily. Taking 3,000mg  daily    . ELIQUIS 5 MG TABS tablet TAKE 1 TABLET BY MOUTH TWICE DAILY 180 tablet 2  . hydrochlorothiazide (HYDRODIURIL) 25 MG tablet TAKE 1 TABLET BY MOUTH  DAILY 90 tablet 2  . levothyroxine (SYNTHROID) 50 MCG tablet TAKE 1 TABLET BY MOUTH  DAILY 90 tablet 3  . potassium chloride (KLOR-CON) 10 MEQ tablet TAKE 1 TABLET BY MOUTH  DAILY 90 tablet 3  . sertraline (ZOLOFT) 50 MG tablet TAKE 1 TABLET BY MOUTH  DAILY 90 tablet 2   No current facility-administered medications for this visit.    Patient confirms/reports the following allergies:  Allergies  Allergen Reactions  . Ativan [Lorazepam] Other (See Comments)    High sensitivity, cause pt to pass out  . Meloxicam Other (See Comments)    Hallucinations  . Propofol Other (See Comments)    Pt states that she woke up early during her last colonoscopy and knee surgery  . Sulfonamide Derivatives Rash    No orders of  the defined types were placed in this encounter.   AUTHORIZATION INFORMATION Primary Insurance: 1D#: Group #:  Secondary Insurance: 1D#: Group #:  SCHEDULE INFORMATION: Date: 03/11/2019 Time: Location: [facility]

## 2019-02-21 NOTE — Telephone Encounter (Signed)
Pt takes Eliquis for afib with CHADS2VASc score of 3 (age, sex, HTN). Renal function is normal. Ok to hold Eliquis for 1-2 days prior to procedure.

## 2019-02-21 NOTE — Telephone Encounter (Signed)
Called and left a message for call back  

## 2019-02-21 NOTE — Telephone Encounter (Signed)
   Primary Cardiologist: Thompson Grayer, MD  Chart reviewed as part of pre-operative protocol coverage. Given past medical history and time since last visit, based on ACC/AHA guidelines, Bonnie Shaw would be at acceptable risk for the planned procedure without further cardiovascular testing.   Pt takes Eliquis for afib with CHADS2VASc score of 3 (age, sex, HTN). Renal function is normal. Ok to hold Eliquis for 1-2 days prior to procedure.  I will route this recommendation to the requesting party via Epic fax function and remove from pre-op pool.  Please call with questions.  Kathyrn Drown, NP 02/21/2019, 9:50 AM

## 2019-02-21 NOTE — Telephone Encounter (Signed)
   [facility] Pre-operative Risk Assessment    Request for surgical clearance:  1. What type of surgery is being performed? Colonoscopy   2. When is this surgery scheduled? 03/11/2019  3. What type of clearance is required (medical clearance vs. Pharmacy clearance to hold med vs. Both)? Blood thinner clearance  4. Are there any medications that need to be held prior to surgery and how long? Eliquis 81m    5. Practice name and name of physician performing surgery? [facility]   6. What is your office phone number [phone]   7.   What is your office fax number [phone] 8.   Anesthesia type (None, local, MAC, general) ? General    AShelby Mattocks1/18/2021, 9:18 AM  _________________________________________________________________   (provider comments below)

## 2019-02-22 ENCOUNTER — Telehealth: Payer: Self-pay | Admitting: Internal Medicine

## 2019-02-22 NOTE — Telephone Encounter (Signed)
Patient verbalized understanding of blood thinner stopping

## 2019-02-22 NOTE — Telephone Encounter (Signed)
Does she just need the note to say she is taking blood thinner?  I am not sure I understand the message.

## 2019-02-22 NOTE — Telephone Encounter (Signed)
Pt called about needing a note for due to her being blood thinner medication of ELIQUIS 5 MG TABS tablet Dr Rayann Heman prescribed it. This is so she can get the covid vaccine. Pt was told her primary had to write the note. Please advise and Thank you!  Call pt @ [phone].

## 2019-02-22 NOTE — Telephone Encounter (Signed)
Mychart sent.

## 2019-03-03 ENCOUNTER — Other Ambulatory Visit: Payer: Self-pay

## 2019-03-03 ENCOUNTER — Encounter: Payer: Self-pay | Admitting: Gastroenterology

## 2019-03-09 ENCOUNTER — Other Ambulatory Visit
Admission: RE | Admit: 2019-03-09 | Discharge: 2019-03-09 | Disposition: A | Discharge status: Home / Self Care | Payer: Medicare Other | Source: Ambulatory Visit | Attending: Gastroenterology | Admitting: Gastroenterology

## 2019-03-09 DIAGNOSIS — Z20822 Contact with and (suspected) exposure to covid-19: Secondary | ICD-10-CM | POA: Diagnosis not present

## 2019-03-09 DIAGNOSIS — Z01812 Encounter for preprocedural laboratory examination: Secondary | ICD-10-CM | POA: Insufficient documentation

## 2019-03-09 LAB — SARS CORONAVIRUS 2 (TAT 6-24 HRS): SARS Coronavirus 2: NEGATIVE

## 2019-03-09 NOTE — Anesthesia Preprocedure Evaluation (Addendum)
Anesthesia Evaluation  Patient identified by MRN, date of birth, ID band Patient awake    Reviewed: Allergy & Precautions, NPO status , Patient's Chart, lab work & pertinent test results  History of Anesthesia Complications Negative for: history of anesthetic complications  Airway Mallampati: III  TM Distance: >3 FB Neck ROM: Full    Dental   Pulmonary    breath sounds clear to auscultation       Cardiovascular hypertension, (-) angina(-) DOE + dysrhythmias (On Eliquis) Atrial Fibrillation  Rhythm:Regular Rate:Normal   HLD   Neuro/Psych  Headaches, PSYCHIATRIC DISORDERS Anxiety  Neuromuscular disease (H/o Guillan-Barre 2018)    GI/Hepatic GERD  Controlled,  Endo/Other  Hypothyroidism   Renal/GU      Musculoskeletal  (+) Arthritis ,   Abdominal (+) + obese (BMI 39),   Peds  Hematology   Anesthesia Other Findings   Reproductive/Obstetrics                            Anesthesia Physical Anesthesia Plan  ASA: III  Anesthesia Plan: General   Post-op Pain Management:    Induction: Intravenous  PONV Risk Score and Plan: 3 and Propofol infusion, TIVA and Treatment may vary due to age or medical condition  Airway Management Planned: Natural Airway and Nasal Cannula  Additional Equipment:   Intra-op Plan:   Post-operative Plan:   Informed Consent: I have reviewed the patients History and Physical, chart, labs and discussed the procedure including the risks, benefits and alternatives for the proposed anesthesia with the patient or authorized representative who has indicated his/her understanding and acceptance.       Plan Discussed with: CRNA and Anesthesiologist  Anesthesia Plan Comments:         Anesthesia Quick Evaluation

## 2019-03-10 NOTE — Discharge Instructions (Signed)
General Anesthesia, Adult, Care After This sheet gives you information about how to care for yourself after your procedure. Your health care provider may also give you more specific instructions. If you have problems or questions, contact your health care provider. What can I expect after the procedure? After the procedure, the following side effects are common:  Pain or discomfort at the IV site.  Nausea.  Vomiting.  Sore throat.  Trouble concentrating.  Feeling cold or chills.  Weak or tired.  Sleepiness and fatigue.  Soreness and body aches. These side effects can affect parts of the body that were not involved in surgery. Follow these instructions at home:  For at least 24 hours after the procedure:  Have a responsible adult stay with you. It is important to have someone help care for you until you are awake and alert.  Rest as needed.  Do not: ? Participate in activities in which you could fall or become injured. ? Drive. ? Use heavy machinery. ? Drink alcohol. ? Take sleeping pills or medicines that cause drowsiness. ? Make important decisions or sign legal documents. ? Take care of children on your own. Eating and drinking  Follow any instructions from your health care provider about eating or drinking restrictions.  When you feel hungry, start by eating small amounts of foods that are soft and easy to digest (bland), such as toast. Gradually return to your regular diet.  Drink enough fluid to keep your urine pale yellow.  If you vomit, rehydrate by drinking water, juice, or clear broth. General instructions  If you have sleep apnea, surgery and certain medicines can increase your risk for breathing problems. Follow instructions from your health care provider about wearing your sleep device: ? Anytime you are sleeping, including during daytime naps. ? While taking prescription pain medicines, sleeping medicines, or medicines that make you drowsy.  Return to  your normal activities as told by your health care provider. Ask your health care provider what activities are safe for you.  Take over-the-counter and prescription medicines only as told by your health care provider.  If you smoke, do not smoke without supervision.  Keep all follow-up visits as told by your health care provider. This is important. Contact a health care provider if:  You have nausea or vomiting that does not get better with medicine.  You cannot eat or drink without vomiting.  You have pain that does not get better with medicine.  You are unable to pass urine.  You develop a skin rash.  You have a fever.  You have redness around your IV site that gets worse. Get help right away if:  You have difficulty breathing.  You have chest pain.  You have blood in your urine or stool, or you vomit blood. Summary  After the procedure, it is common to have a sore throat or nausea. It is also common to feel tired.  Have a responsible adult stay with you for the first 24 hours after general anesthesia. It is important to have someone help care for you until you are awake and alert.  When you feel hungry, start by eating small amounts of foods that are soft and easy to digest (bland), such as toast. Gradually return to your regular diet.  Drink enough fluid to keep your urine pale yellow.  Return to your normal activities as told by your health care provider. Ask your health care provider what activities are safe for you. This information is not   intended to replace advice given to you by your health care provider. Make sure you discuss any questions you have with your health care provider. Document Revised: 01/23/2017 Document Reviewed: 09/05/2016 Elsevier Patient Education  2020 Elsevier Inc.  

## 2019-03-11 ENCOUNTER — Ambulatory Visit
Admission: RE | Admit: 2019-03-11 | Discharge: 2019-03-11 | Disposition: A | Discharge status: Home / Self Care | Payer: Medicare Other | Source: Ambulatory Visit | Attending: Gastroenterology | Admitting: Gastroenterology

## 2019-03-11 ENCOUNTER — Ambulatory Visit: Payer: Medicare Other | Admitting: Anesthesiology

## 2019-03-11 ENCOUNTER — Encounter
Admission: RE | Disposition: A | Discharge status: Home / Self Care | Payer: Self-pay | Source: Ambulatory Visit | Attending: Gastroenterology

## 2019-03-11 ENCOUNTER — Encounter: Payer: Self-pay | Admitting: Gastroenterology

## 2019-03-11 ENCOUNTER — Other Ambulatory Visit: Payer: Self-pay

## 2019-03-11 DIAGNOSIS — E669 Obesity, unspecified: Secondary | ICD-10-CM | POA: Insufficient documentation

## 2019-03-11 DIAGNOSIS — I10 Essential (primary) hypertension: Secondary | ICD-10-CM | POA: Insufficient documentation

## 2019-03-11 DIAGNOSIS — E785 Hyperlipidemia, unspecified: Secondary | ICD-10-CM | POA: Insufficient documentation

## 2019-03-11 DIAGNOSIS — E039 Hypothyroidism, unspecified: Secondary | ICD-10-CM | POA: Diagnosis not present

## 2019-03-11 DIAGNOSIS — D122 Benign neoplasm of ascending colon: Secondary | ICD-10-CM | POA: Insufficient documentation

## 2019-03-11 DIAGNOSIS — Z79890 Hormone replacement therapy (postmenopausal): Secondary | ICD-10-CM | POA: Diagnosis not present

## 2019-03-11 DIAGNOSIS — K635 Polyp of colon: Secondary | ICD-10-CM

## 2019-03-11 DIAGNOSIS — Z6838 Body mass index (BMI) 38.0-38.9, adult: Secondary | ICD-10-CM | POA: Insufficient documentation

## 2019-03-11 DIAGNOSIS — I4819 Other persistent atrial fibrillation: Secondary | ICD-10-CM | POA: Diagnosis not present

## 2019-03-11 DIAGNOSIS — Z7901 Long term (current) use of anticoagulants: Secondary | ICD-10-CM | POA: Insufficient documentation

## 2019-03-11 DIAGNOSIS — Z1211 Encounter for screening for malignant neoplasm of colon: Secondary | ICD-10-CM | POA: Diagnosis present

## 2019-03-11 DIAGNOSIS — Z79899 Other long term (current) drug therapy: Secondary | ICD-10-CM | POA: Insufficient documentation

## 2019-03-11 DIAGNOSIS — Z8371 Family history of colonic polyps: Secondary | ICD-10-CM | POA: Diagnosis not present

## 2019-03-11 DIAGNOSIS — F419 Anxiety disorder, unspecified: Secondary | ICD-10-CM | POA: Insufficient documentation

## 2019-03-11 DIAGNOSIS — M1990 Unspecified osteoarthritis, unspecified site: Secondary | ICD-10-CM | POA: Insufficient documentation

## 2019-03-11 HISTORY — DX: Motion sickness, initial encounter: T75.3XXA

## 2019-03-11 HISTORY — DX: Family history of other specified conditions: Z84.89

## 2019-03-11 HISTORY — DX: Unspecified osteoarthritis, unspecified site: M19.90

## 2019-03-11 HISTORY — PX: POLYPECTOMY: SHX5525

## 2019-03-11 HISTORY — PX: COLONOSCOPY WITH PROPOFOL: SHX5780

## 2019-03-11 HISTORY — DX: Hypothyroidism, unspecified: E03.9

## 2019-03-11 HISTORY — DX: Migraine, unspecified, not intractable, without status migrainosus: G43.909

## 2019-03-11 SURGERY — COLONOSCOPY WITH PROPOFOL
Anesthesia: General

## 2019-03-11 MED ORDER — ONDANSETRON HCL 4 MG/2ML IJ SOLN: 4.0000 mg | Freq: Once | INTRAMUSCULAR | Status: DC | PRN

## 2019-03-11 MED ORDER — STERILE WATER FOR IRRIGATION IR SOLN
Status: DC | PRN
  Administered 2019-03-11: 11:00:00 50 mL

## 2019-03-11 MED ORDER — ACETAMINOPHEN 10 MG/ML IV SOLN: 1000.0000 mg | Freq: Once | INTRAVENOUS | Status: DC | PRN

## 2019-03-11 MED ORDER — LIDOCAINE HCL (CARDIAC) PF 100 MG/5ML IV SOSY
PREFILLED_SYRINGE | INTRAVENOUS | Status: DC | PRN
  Administered 2019-03-11: 40 mg via INTRAVENOUS

## 2019-03-11 MED ORDER — PROPOFOL 10 MG/ML IV BOLUS
INTRAVENOUS | Status: DC | PRN
  Administered 2019-03-11 (×2): 50 mg via INTRAVENOUS
  Administered 2019-03-11: 20 mg via INTRAVENOUS
  Administered 2019-03-11: 80 mg via INTRAVENOUS

## 2019-03-11 MED ORDER — LACTATED RINGERS IV SOLN
100.0000 mL/h | INTRAVENOUS | Status: DC
  Administered 2019-03-11: 09:00:00 100 mL/h via INTRAVENOUS

## 2019-03-11 SURGICAL SUPPLY — 8 items
CANISTER SUCT 1200ML W/VALVE (MISCELLANEOUS) ×3 IMPLANT
CLIP HMST11XOPN 235X2.8X (MISCELLANEOUS) ×2 IMPLANT
CLIP RESOLUTION 360 11X235 (MISCELLANEOUS) ×1
GOWN CVR UNV OPN BCK APRN NK (MISCELLANEOUS) ×4 IMPLANT
GOWN ISOL THUMB LOOP REG UNIV (MISCELLANEOUS) ×2
KIT ENDO PROCEDURE OLY (KITS) ×3 IMPLANT
SNARE COLD EXACTO (MISCELLANEOUS) ×3 IMPLANT
WATER STERILE IRR 250ML POUR (IV SOLUTION) ×3 IMPLANT

## 2019-03-11 NOTE — Transfer of Care (Signed)
Immediate Anesthesia Transfer of Care Note  Patient: Bonnie Shaw  Procedure(s) Performed: COLONOSCOPY WITH PROPOFOL (N/A )  Patient Location: PACU  Anesthesia Type: General  Level of Consciousness: awake, alert  and patient cooperative  Airway and Oxygen Therapy: Patient Spontanous Breathing and Patient connected to supplemental oxygen  Post-op Assessment: Post-op Vital signs reviewed, Patient's Cardiovascular Status Stable, Respiratory Function Stable, Patent Airway and No signs of Nausea or vomiting  Post-op Vital Signs: Reviewed and stable  Complications: No apparent anesthesia complications

## 2019-03-11 NOTE — Anesthesia Postprocedure Evaluation (Signed)
Anesthesia Post Note  Patient: Bonnie Shaw  Procedure(s) Performed: COLONOSCOPY WITH PROPOFOL (N/A )     Patient location during evaluation: PACU Anesthesia Type: General Level of consciousness: awake and alert Pain management: pain level controlled Vital Signs Assessment: post-procedure vital signs reviewed and stable Respiratory status: spontaneous breathing, nonlabored ventilation, respiratory function stable and patient connected to nasal cannula oxygen Cardiovascular status: blood pressure returned to baseline and stable Postop Assessment: no apparent nausea or vomiting Anesthetic complications: no    Latash Nouri A  Kimbria Camposano

## 2019-03-11 NOTE — H&P (Signed)
Jonathon Bellows, MD [address], [place], [place], [place], [id number], [place], [place], [place], [address] Phone: [phone]  Fax: [phone]  Primary Care Physician:  Einar Pheasant, MD   Pre-Procedure History & Physical: HPI:  Bonnie Shaw is a 69 y.o. female is here for an colonoscopy.   Past Medical History:  Diagnosis Date  . Anxiety   . Arthritis   . Cataract   . Complication of anesthesia    Woke up during colonoscopy and during Knee surgery. Also says "nitrous makes me crazy"  . Family history of adverse reaction to anesthesia    son woke up during TEE  . Guillain-Barre ([place]) 2018   recovered  . Hyperlipidemia   . Hypertension   . Hyperthyroidism    s/p RAI therapy  . Hypothyroidism    s/p ablation  . Left atrial enlargement    severely enlarged  . Migraine headache    2x/yr  . Motion sickness    roller coasters  . Obesity   . Panic disorder   . Persistent atrial fibrillation ([place])   . Snoring     Past Surgical History:  Procedure Laterality Date  . ABDOMINAL HYSTERECTOMY    . CARDIOVERSION N/A 09/11/2017   Procedure: CARDIOVERSION;  Surgeon: Skeet Latch, MD;  Location: [facility];  Service: Cardiovascular;  Laterality: N/A;  . CATARACT EXTRACTION    . KNEE SURGERY Bilateral    replacements  . THYROID ABLATION  2010    Prior to Admission medications   Medication Sig Start Date End Date Taking? Authorizing Provider  atorvastatin (LIPITOR) 20 MG tablet TAKE 1 TABLET BY MOUTH  DAILY 02/02/19  Yes Einar Pheasant, MD  cholecalciferol (VITAMIN D3) 25 MCG (1000 UT) tablet Take 1,000 Units by mouth daily. Taking 3,000mg  daily   Yes [provider]  ELIQUIS 5 MG TABS tablet TAKE 1 TABLET BY MOUTH TWICE DAILY 07/28/18  Yes Allred, Jeneen Rinks, MD  hydrochlorothiazide (HYDRODIURIL) 25 MG tablet TAKE 1 TABLET BY MOUTH  DAILY 07/30/18  Yes Einar Pheasant, MD  levothyroxine (SYNTHROID) 50 MCG tablet TAKE 1 TABLET BY MOUTH  DAILY  02/02/19  Yes Einar Pheasant, MD  potassium chloride (KLOR-CON) 10 MEQ tablet TAKE 1 TABLET BY MOUTH  DAILY 02/02/19  Yes Einar Pheasant, MD  sertraline (ZOLOFT) 50 MG tablet TAKE 1 TABLET BY MOUTH  DAILY 07/30/18  Yes Einar Pheasant, MD    Allergies as of 02/21/2019 - Review Complete 02/20/2019  Allergen Reaction Noted  . Ativan [lorazepam] Other (See Comments) 07/29/2016  . Meloxicam Other (See Comments) 03/08/2014  . Propofol Other (See Comments) 09/08/2017  . Sulfonamide derivatives Rash     Family History  Problem Relation Age of Onset  . Alzheimer's disease Father   . Stroke Mother   . Cancer Sister        ovarian   . Ovarian cancer Sister 41  . Breast cancer Neg Hx     Social History   Socioeconomic History  . Marital status: Married    Spouse name: Not on file  . Number of children: 4  . Years of education: Not on file  . Highest education level: Not on file  Occupational History    Employer: UNEMPLOYED  Tobacco Use  . Smoking status: Never Smoker  . Smokeless tobacco: Never Used  Substance and Sexual Activity  . Alcohol use: No  . Drug use: No  . Sexual activity: Not on file  Other Topics Concern  .  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   Social Determinants of Radio broadcast assistant Strain:   . Difficulty of Paying Living Expenses: Not on file  Food Insecurity:   . Worried About Charity fundraiser in the Last Year: Not on file  . Ran Out of Food in the Last Year: Not on file  Transportation Needs:   . Lack of Transportation (Medical): Not on file  . Lack of Transportation (Non-Medical): Not on file  Physical Activity:   . Days of Exercise per Week: Not on file  . Minutes of Exercise per Session: Not on file  Stress:   . Feeling of Stress : Not on file   Social Connections:   . Frequency of Communication with Friends and Family: Not on file  . Frequency of Social Gatherings with Friends and Family: Not on file  . Attends Religious Services: Not on file  . Active Member of Clubs or Organizations: Not on file  . Attends Archivist Meetings: Not on file  . Marital Status: Not on file  Intimate Partner Violence:   . Fear of Current or Ex-Partner: Not on file  . Emotionally Abused: Not on file  . Physically Abused: Not on file  . Sexually Abused: Not on file    Review of Systems: See HPI, otherwise negative ROS  Physical Exam: BP 125/86   Pulse 63   Temp 97.7 F (36.5 C) (Temporal)   Resp 18   Ht 5\' 3"  (1.6 m)   Wt 99.8 kg   SpO2 99%   BMI 38.97 kg/m  General:   Alert,  pleasant and cooperative in NAD Head:  Normocephalic and atraumatic. Neck:  Supple; no masses or thyromegaly. Lungs:  Clear throughout to auscultation, normal respiratory effort.    Heart:  +S1, +S2, Regular rate and rhythm, No edema. Abdomen:  Soft, nontender and nondistended. Normal bowel sounds, without guarding, and without rebound.   Neurologic:  Alert and  oriented x4;  grossly normal neurologically.  Impression/Plan: [name] is here for an colonoscopy to be performed for Screening colonoscopy family history of colon polyps Risks, benefits, limitations, and alternatives regarding  colonoscopy have been reviewed with the patient.  Questions have been answered.  All parties agreeable.   Jonathon Bellows, MD  03/11/2019, 10:59 AM

## 2019-03-11 NOTE — Op Note (Signed)
[facility] Patient Name: Bonnie Shaw Procedure Date: 03/11/2019 11:01 AM MRN: [id number] Account #: [id number] Date of Birth: 09/17/1950 Admit Type: Outpatient Age: 69 Room: [facility] OR ROOM 01 Gender: Female Note Status: Finalized Procedure:             Colonoscopy Indications:           Colon cancer screening in patient at increased risk:                         Family history of 1st-degree relative with colon polyps Providers:             Jonathon Bellows MD, MD Referring MD:          Einar Pheasant, MD (Referring MD) Medicines:             Monitored Anesthesia Care Complications:         No immediate complications. Procedure:             Pre-Anesthesia Assessment:                        - Prior to the procedure, a History and Physical was                         performed, and patient medications, allergies and                         sensitivities were reviewed. The patient's tolerance                         of previous anesthesia was reviewed.                        - The risks and benefits of the procedure and the                         sedation options and risks were discussed with the                         patient. All questions were answered and informed                         consent was obtained.                        - ASA Grade Assessment: II - A patient with mild                         systemic disease.                        After obtaining informed consent, the colonoscope was                         passed under direct vision. Throughout the procedure,                         the patient's blood pressure, pulse, and oxygen                         saturations  were monitored continuously. The was                         introduced through the anus and advanced to the the                         cecum, identified by the appendiceal orifice. The                         colonoscopy was performed with ease. The patient         tolerated the procedure well. The quality of the bowel                         preparation was excellent. Findings:      The perianal and digital rectal examinations were normal.      A 10 mm polyp was found in the proximal ascending colon. The polyp was       sessile. The polyp was removed with a cold snare. Resection and       retrieval were complete. To prevent bleeding after the polypectomy, one       hemostatic clip was successfully placed. There was no bleeding at the       end of the procedure.      The exam was otherwise without abnormality on direct and retroflexion       views. Impression:            - One 10 mm polyp in the proximal ascending colon,                         removed with a cold snare. Resected and retrieved.                         Clip was placed.                        - The examination was otherwise normal on direct and                         retroflexion views. Recommendation:        - Discharge patient to home (with escort).                        - Resume previous diet.                        - Continue present medications.                        - Await pathology results.                        - Repeat colonoscopy in 3 years for surveillance. Procedure Code(s):     --- Professional ---                        [phone], Colonoscopy, flexible; with removal of                         tumor(s), polyp(s), or other lesion(s) by snare  technique Diagnosis Code(s):     --- Professional ---                        Z83.71, Family history of colonic polyps                        K63.5, Polyp of colon CPT copyright 2019 American Medical Association. All rights reserved. The codes documented in this report are preliminary and upon coder review may  be revised to meet current compliance requirements. Jonathon Bellows, MD Jonathon Bellows MD, MD 03/11/2019 11:28:21 AM This report has been signed electronically. Number of Addenda: 0 Note Initiated On:  03/11/2019 11:01 AM Scope Withdrawal Time: 0 hours 9 minutes 25 seconds  Total Procedure Duration: 0 hours 11 minutes 54 seconds  Estimated Blood Loss:  Estimated blood loss: none.      [facility]

## 2019-03-14 ENCOUNTER — Telehealth: Payer: Self-pay | Admitting: Gastroenterology

## 2019-03-14 ENCOUNTER — Encounter: Payer: Self-pay | Admitting: *Deleted

## 2019-03-14 NOTE — Telephone Encounter (Signed)
LVM for pt to return my call.

## 2019-03-14 NOTE — Telephone Encounter (Signed)
Pt left vm she states she just had a colonoscopy with Dr. Vicente Males 03/11/19 and she has a real concern about something that happened during it  and she needs to discuss with someone she is a retired Marine scientist and she considers it wrong please call pt

## 2019-03-15 ENCOUNTER — Telehealth: Payer: Self-pay | Admitting: Gastroenterology

## 2019-03-15 LAB — SURGICAL PATHOLOGY

## 2019-03-15 NOTE — Telephone Encounter (Signed)
Contacted pt back regarding her concern. I have contacted [facility].

## 2019-03-15 NOTE — Telephone Encounter (Signed)
I called and spoke to the patient.  Informed that she had a tubular adenoma.  Repeat colonoscopy in 3 years.  Was passed on the message that she felt that the procedure in the initial part.  Particularly when the scope was inserted into the rectum.  I apologized that she felt a part of the procedure.  Explained that due to the medication she probably was not able to tell us that she was not yet asleep.  She had no other concerns.   Dr Jonathon Bellows MD,MRCP [facility]) Gastroenterology/Hepatology Pager: [phone]

## 2019-03-27 ENCOUNTER — Encounter: Payer: Self-pay | Admitting: Gastroenterology

## 2019-04-02 ENCOUNTER — Other Ambulatory Visit: Payer: Self-pay | Admitting: Internal Medicine

## 2019-04-11 ENCOUNTER — Other Ambulatory Visit: Payer: Self-pay

## 2019-04-11 MED ORDER — APIXABAN 5 MG PO TABS: 5.0000 mg | ORAL_TABLET | Freq: Two times a day (BID) | ORAL | 1 refills | Status: DC

## 2019-04-28 ENCOUNTER — Other Ambulatory Visit: Payer: Self-pay | Admitting: Internal Medicine

## 2019-04-28 NOTE — Telephone Encounter (Signed)
Pt last saw Roderic Palau, NP on 11/18/18, last labs 02/11/19 Creat 0.73, age 69, weight 99.8kg, based on specified criteria pt is on appropriate dosage of Eliquis 5mg  BID.  Will refill rx.

## 2019-06-21 ENCOUNTER — Other Ambulatory Visit: Payer: Self-pay

## 2019-06-21 ENCOUNTER — Other Ambulatory Visit (INDEPENDENT_AMBULATORY_CARE_PROVIDER_SITE_OTHER): Payer: Medicare Other

## 2019-06-21 DIAGNOSIS — R739 Hyperglycemia, unspecified: Secondary | ICD-10-CM | POA: Diagnosis not present

## 2019-06-21 DIAGNOSIS — I10 Essential (primary) hypertension: Secondary | ICD-10-CM | POA: Diagnosis not present

## 2019-06-21 DIAGNOSIS — E785 Hyperlipidemia, unspecified: Secondary | ICD-10-CM | POA: Diagnosis not present

## 2019-06-21 LAB — BASIC METABOLIC PANEL
BUN: 17 mg/dL (ref 6–23)
CO2: 29 mEq/L (ref 19–32)
Calcium: 9.3 mg/dL (ref 8.4–10.5)
Chloride: 102 mEq/L (ref 96–112)
Creatinine, Ser: 0.86 mg/dL (ref 0.40–1.20)
GFR: 65.28 mL/min (ref >60.00)
Glucose, Bld: 116 mg/dL — ABNORMAL HIGH (ref 70–99)
Potassium: 3.3 mEq/L — ABNORMAL LOW (ref 3.5–5.1)
Sodium: 138 mEq/L (ref 135–145)

## 2019-06-21 LAB — HEMOGLOBIN A1C: Hgb A1c MFr Bld: 5.8 % (ref 4.6–6.5)

## 2019-06-21 LAB — LIPID PANEL
Cholesterol: 141 mg/dL (ref 0–200)
HDL: 40 mg/dL (ref >39.00)
LDL Cholesterol: 69 mg/dL (ref 0–99)
NonHDL: 100.63
Total CHOL/HDL Ratio: 4
Triglycerides: 156 mg/dL — ABNORMAL HIGH (ref 0.0–149.0)
VLDL: 31.2 mg/dL (ref 0.0–40.0)

## 2019-06-21 LAB — HEPATIC FUNCTION PANEL
ALT: 14 U/L (ref 0–35)
AST: 14 U/L (ref 0–37)
Albumin: 4.4 g/dL (ref 3.5–5.2)
Alkaline Phosphatase: 55 U/L (ref 39–117)
Bilirubin, Direct: 0.3 mg/dL (ref 0.0–0.3)
Total Bilirubin: 1.7 mg/dL — ABNORMAL HIGH (ref 0.2–1.2)
Total Protein: 7 g/dL (ref 6.0–8.3)

## 2019-06-22 ENCOUNTER — Other Ambulatory Visit: Payer: Self-pay | Admitting: Internal Medicine

## 2019-06-22 DIAGNOSIS — E876 Hypokalemia: Secondary | ICD-10-CM

## 2019-06-22 NOTE — Progress Notes (Signed)
Orders placed for f/u labs.  

## 2019-06-23 ENCOUNTER — Ambulatory Visit (INDEPENDENT_AMBULATORY_CARE_PROVIDER_SITE_OTHER): Payer: Medicare Other | Admitting: Internal Medicine

## 2019-06-23 ENCOUNTER — Other Ambulatory Visit: Payer: Self-pay

## 2019-06-23 DIAGNOSIS — I10 Essential (primary) hypertension: Secondary | ICD-10-CM | POA: Diagnosis not present

## 2019-06-23 DIAGNOSIS — F419 Anxiety disorder, unspecified: Secondary | ICD-10-CM

## 2019-06-23 DIAGNOSIS — R739 Hyperglycemia, unspecified: Secondary | ICD-10-CM

## 2019-06-23 DIAGNOSIS — E785 Hyperlipidemia, unspecified: Secondary | ICD-10-CM | POA: Diagnosis not present

## 2019-06-23 DIAGNOSIS — E039 Hypothyroidism, unspecified: Secondary | ICD-10-CM

## 2019-06-23 DIAGNOSIS — I4819 Other persistent atrial fibrillation: Secondary | ICD-10-CM

## 2019-06-23 NOTE — Progress Notes (Signed)
Patient ID: Bonnie Shaw, female   DOB: 02/10/1950, 69 y.o.   MRN: [id number]   Subjective:    Patient ID: Bonnie Shaw, female    DOB: [birth year], 69 y.o.   MRN: [id number]  HPI This visit occurred during the SARS-CoV-2 public health emergency.  Safety protocols were in place, including screening questions prior to the visit, additional usage of staff PPE, and extensive cleaning of exam room while observing appropriate contact time as indicated for disinfecting solutions.  Patient here for a scheduled follow up.  Here to follow up regarding her afib, hypertension and hypercholesterolemia.  She reports increased stress.  Her daughter just delivered a baby boy.  She had increased bleeding and problems with the birth.  He was born at 3 weeks.  Lived 7 days.  Daughter is doing better.  Still with increased stress dealing with the loss, etc.  Has good support.  Does not feel needs anything more.  Reports she is otherwise doing ok.  No chest pain.  Denies any increased heart rate or palpitations.  Breathing stable.  Right third trigger finger.  Declines intervention.  No abdominal pain.  Stays active.  On zoloft and doing well on the medication.     Past Medical History:  Diagnosis Date  . Anxiety   . Arthritis   . Cataract   . Complication of anesthesia    Woke up during colonoscopy and during Knee surgery. Also says "nitrous makes me crazy"  . Family history of adverse reaction to anesthesia    son woke up during TEE  . Guillain-Barre ([place]) 2018   recovered  . Hyperlipidemia   . Hypertension   . Hyperthyroidism    s/p RAI therapy  . Hypothyroidism    s/p ablation  . Left atrial enlargement    severely enlarged  . Migraine headache    2x/yr  . Motion sickness    roller coasters  . Obesity   . Panic disorder   . Persistent atrial fibrillation ([place])   . Snoring    Past Surgical History:  Procedure Laterality Date  . ABDOMINAL HYSTERECTOMY    . CARDIOVERSION N/A 09/11/2017   Procedure: CARDIOVERSION;  Surgeon: Skeet Latch, MD;  Location: [facility];  Service: Cardiovascular;  Laterality: N/A;  . CATARACT EXTRACTION    . COLONOSCOPY WITH PROPOFOL N/A 03/11/2019   Procedure: COLONOSCOPY WITH PROPOFOL;  Surgeon: Jonathon Bellows, MD;  Location: [facility];  Service: Endoscopy;  Laterality: N/A;  colon  . KNEE SURGERY Bilateral    replacements  . POLYPECTOMY  03/11/2019   Procedure: POLYPECTOMY;  Surgeon: Jonathon Bellows, MD;  Location: [facility];  Service: Endoscopy;;  . THYROID ABLATION  2010   Family History  Problem Relation Age of Onset  . Alzheimer's disease Father   . Stroke Mother   . Cancer Sister        ovarian   . Ovarian cancer Sister 22  . Breast cancer Neg Hx    Social History   Socioeconomic History  . Marital status: Married    Spouse name: Not on file  . Number of children: 4  . Years of education: Not on file  . Highest education level: Not on file  Occupational History    Employer: UNEMPLOYED  Tobacco Use  . Smoking status: Never Smoker  . Smokeless tobacco: Never Used  Substance and Sexual Activity  . Alcohol use: No  . Drug use: No  . Sexual activity: Not on file  Other  Topics Concern  . Not on file  Social History Narrative   Lives in [place] with husband, has 4 children and 14 grandkids         Attends [employer], 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    Engineer, water as a Radiation protection practitioner for the [employer]   Retired [employer]   Social Determinants of Radio broadcast assistant Strain:   . Difficulty of Paying Living Expenses:   Food Insecurity:   . Worried About Charity fundraiser in the Last Year:   . Arboriculturist in the Last Year:   Transportation Needs:   . Film/video editor (Medical):   Marland Kitchen Lack of Transportation (Non-Medical):   Physical Activity:   . Days of Exercise per Week:   . Minutes of Exercise per Session:   Stress:   . Feeling of Stress :   Social Connections:   . Frequency of Communication  with Friends and Family:   . Frequency of Social Gatherings with Friends and Family:   . Attends Religious Services:   . Active Member of Clubs or Organizations:   . Attends Archivist Meetings:   Marland Kitchen Marital Status:     Outpatient Encounter Medications as of 06/23/2019  Medication Sig  . atorvastatin (LIPITOR) 20 MG tablet TAKE 1 TABLET BY MOUTH  DAILY  . cholecalciferol (VITAMIN D3) 25 MCG (1000 UT) tablet Take 1,000 Units by mouth daily. Taking 3,066m daily  . ELIQUIS 5 MG TABS tablet TAKE 1 TABLET BY MOUTH TWICE DAILY  . hydrochlorothiazide (HYDRODIURIL) 25 MG tablet TAKE 1 TABLET BY MOUTH  DAILY  . levothyroxine (SYNTHROID) 50 MCG tablet TAKE 1 TABLET BY MOUTH  DAILY  . potassium chloride (KLOR-CON) 10 MEQ tablet TAKE 1 TABLET BY MOUTH  DAILY  . sertraline (ZOLOFT) 50 MG tablet TAKE 1 TABLET BY MOUTH  DAILY   No facility-administered encounter medications on file as of 06/23/2019.    Review of Systems  Constitutional: Negative for appetite change and unexpected weight change.  HENT: Negative for congestion and sinus pressure.   Respiratory: Negative for cough, chest tightness and shortness of breath.   Cardiovascular: Negative for chest pain, palpitations and leg swelling.  Gastrointestinal: Negative for abdominal pain, diarrhea, nausea and vomiting.  Genitourinary: Negative for difficulty urinating and dysuria.  Musculoskeletal: Negative for joint swelling and myalgias.  Skin: Negative for color change and rash.  Neurological: Negative for dizziness, light-headedness and headaches.  Psychiatric/Behavioral: Negative for agitation and dysphoric mood.       Increased stress as outlined.         Objective:    Physical Exam Vitals reviewed.  Constitutional:      Appearance: Normal appearance.  HENT:     Head: Normocephalic and atraumatic.     Right Ear: External ear normal.     Left Ear: External ear normal.  Eyes:     General: No scleral icterus.       Right  eye: No discharge.        Left eye: No discharge.     Conjunctiva/sclera: Conjunctivae normal.  Neck:     Thyroid: No thyromegaly.  Cardiovascular:     Comments: Irregular.  Rate controlled.   Pulmonary:     Effort: No respiratory distress.     Breath sounds: Normal breath sounds. No wheezing.  Abdominal:     General: Bowel sounds are normal.     Palpations: Abdomen is soft.     Tenderness: There is  no abdominal tenderness.  Musculoskeletal:        General: No swelling or tenderness.     Cervical back: Neck supple. No tenderness.  Lymphadenopathy:     Cervical: No cervical adenopathy.  Skin:    Findings: No erythema or rash.  Neurological:     Mental Status: She is alert.  Psychiatric:        Mood and Affect: Mood normal.        Behavior: Behavior normal.     BP 132/82   Pulse 67   Temp 98 F (36.7 C)   Resp 16   Ht 5' 3"  (1.6 m)   Wt 219 lb 9.6 oz (99.6 kg)   SpO2 98%   BMI 38.90 kg/m  Wt Readings from Last 3 Encounters:  06/23/19 219 lb 9.6 oz (99.6 kg)  03/11/19 220 lb (99.8 kg)  02/15/19 221 lb (100.2 kg)     Lab Results  Component Value Date   WBC 9.0 11/01/2018   HGB 14.6 11/01/2018   HCT 42.3 11/01/2018   PLT 226.0 11/01/2018   GLUCOSE 116 (H) 06/21/2019   CHOL 141 06/21/2019   TRIG 156.0 (H) 06/21/2019   HDL 40.00 06/21/2019   LDLDIRECT 97.0 11/01/2018   LDLCALC 69 06/21/2019   ALT 14 06/21/2019   AST 14 06/21/2019   NA 138 06/21/2019   K 3.3 (L) 06/21/2019   CL 102 06/21/2019   CREATININE 0.86 06/21/2019   BUN 17 06/21/2019   CO2 29 06/21/2019   TSH 3.62 11/01/2018   HGBA1C 5.8 06/21/2019   MICROALBUR <0.7 06/11/2015       Assessment & Plan:   Problem List Items Addressed This Visit    Anxiety    Has done well on zoloft.  Increased stress recently.  Discussed with her today.  She has good support.  Does not feel needs any further intervention.  Follow.        Atrial fibrillation ([place])    On eliquis.  Rated controlled.  Doing  well.  Follow.        Hyperglycemia    Low carb diet and exercise.  Follow met b and a1c.        Relevant Orders   Hemoglobin A1c   Hyperlipidemia    Low cholesterol diet and exercise.  On lipitor.  Follow  Lipid panel and liver function tests.        Relevant Orders   Hepatic function panel   Lipid panel   Hypertension    Blood pressure on recheck ok.  States has been ok.  She was upset when first arrived.  Continue hctz.  Follow pressures.  Follow metabolic panel.       Relevant Orders   CBC with Differential/Platelet   Basic metabolic panel   Hypothyroidism    On thyroid replacement.  Follow tsh.        Relevant Orders   TSH       Einar Pheasant, MD

## 2019-06-26 ENCOUNTER — Encounter: Payer: Self-pay | Admitting: Internal Medicine

## 2019-06-26 NOTE — Assessment & Plan Note (Signed)
On eliquis.  Rated controlled.  Doing well.  Follow.

## 2019-06-26 NOTE — Assessment & Plan Note (Signed)
On thyroid replacement.  Follow tsh.  

## 2019-06-26 NOTE — Assessment & Plan Note (Signed)
Blood pressure on recheck ok.  States has been ok.  She was upset when first arrived.  Continue hctz.  Follow pressures.  Follow metabolic panel.

## 2019-06-26 NOTE — Assessment & Plan Note (Signed)
Has done well on zoloft.  Increased stress recently.  Discussed with her today.  She has good support.  Does not feel needs any further intervention.  Follow.

## 2019-06-26 NOTE — Assessment & Plan Note (Signed)
Low carb diet and exercise.  Follow met b and a1c.   

## 2019-06-26 NOTE — Assessment & Plan Note (Signed)
Low cholesterol diet and exercise.  On lipitor.  Follow  Lipid panel and liver function tests.

## 2019-07-05 ENCOUNTER — Other Ambulatory Visit (INDEPENDENT_AMBULATORY_CARE_PROVIDER_SITE_OTHER): Payer: Medicare Other

## 2019-07-05 ENCOUNTER — Telehealth: Payer: Self-pay | Admitting: *Deleted

## 2019-07-05 ENCOUNTER — Other Ambulatory Visit: Payer: Medicare Other

## 2019-07-05 ENCOUNTER — Other Ambulatory Visit: Payer: Self-pay

## 2019-07-05 DIAGNOSIS — R739 Hyperglycemia, unspecified: Secondary | ICD-10-CM

## 2019-07-05 DIAGNOSIS — E785 Hyperlipidemia, unspecified: Secondary | ICD-10-CM

## 2019-07-05 DIAGNOSIS — E039 Hypothyroidism, unspecified: Secondary | ICD-10-CM

## 2019-07-05 DIAGNOSIS — I10 Essential (primary) hypertension: Secondary | ICD-10-CM

## 2019-07-05 DIAGNOSIS — E876 Hypokalemia: Secondary | ICD-10-CM

## 2019-07-05 LAB — POTASSIUM: Potassium: 3.9 mEq/L (ref 3.5–5.1)

## 2019-07-05 NOTE — Addendum Note (Signed)
Addended by: Leeanne Rio on: 07/05/2019 01:31 PM   Modules accepted: Orders

## 2019-07-05 NOTE — Addendum Note (Signed)
Addended by: Leeanne Rio on: 07/05/2019 10:20 AM   Modules accepted: Orders

## 2019-07-05 NOTE — Telephone Encounter (Signed)
Pt came in for labs this morning. Not sure which labs she need sent for testing. Will hold tubes until I received a response. If just potassium, I will reorder it.   Thanks

## 2019-07-05 NOTE — Telephone Encounter (Signed)
Just potassium is all I need.  Thanks

## 2019-07-06 ENCOUNTER — Other Ambulatory Visit: Payer: Self-pay | Admitting: Internal Medicine

## 2019-07-06 DIAGNOSIS — E876 Hypokalemia: Secondary | ICD-10-CM

## 2019-07-06 NOTE — Progress Notes (Signed)
Order placed for f/u potassium.  

## 2019-07-07 ENCOUNTER — Encounter: Payer: Self-pay | Admitting: Internal Medicine

## 2019-07-07 NOTE — Telephone Encounter (Signed)
Order placed for f/u liver panel to be drawn with next potassium.

## 2019-07-19 ENCOUNTER — Telehealth: Payer: Self-pay | Admitting: *Deleted

## 2019-07-19 ENCOUNTER — Encounter: Payer: Self-pay | Admitting: Medical Oncology

## 2019-07-19 ENCOUNTER — Emergency Department
Admission: EM | Admit: 2019-07-19 | Discharge: 2019-07-19 | Disposition: A | Discharge status: Home / Self Care | Payer: Medicare Other | Attending: Emergency Medicine | Admitting: Emergency Medicine

## 2019-07-19 ENCOUNTER — Other Ambulatory Visit (INDEPENDENT_AMBULATORY_CARE_PROVIDER_SITE_OTHER): Payer: Medicare Other

## 2019-07-19 ENCOUNTER — Other Ambulatory Visit: Payer: Self-pay

## 2019-07-19 DIAGNOSIS — E876 Hypokalemia: Secondary | ICD-10-CM | POA: Diagnosis not present

## 2019-07-19 DIAGNOSIS — E039 Hypothyroidism, unspecified: Secondary | ICD-10-CM | POA: Insufficient documentation

## 2019-07-19 DIAGNOSIS — Z7901 Long term (current) use of anticoagulants: Secondary | ICD-10-CM | POA: Diagnosis not present

## 2019-07-19 DIAGNOSIS — Z79899 Other long term (current) drug therapy: Secondary | ICD-10-CM | POA: Diagnosis not present

## 2019-07-19 DIAGNOSIS — I10 Essential (primary) hypertension: Secondary | ICD-10-CM | POA: Diagnosis not present

## 2019-07-19 DIAGNOSIS — R55 Syncope and collapse: Secondary | ICD-10-CM | POA: Diagnosis present

## 2019-07-19 LAB — HEPATIC FUNCTION PANEL
ALT: 13 U/L (ref 0–35)
ALT: 18 U/L (ref 0–44)
AST: 13 U/L (ref 0–37)
AST: 18 U/L (ref 15–41)
Albumin: 4.2 g/dL (ref 3.5–5.2)
Albumin: 4.1 g/dL (ref 3.5–5.0)
Alkaline Phosphatase: 69 U/L (ref 39–117)
Alkaline Phosphatase: 61 U/L (ref 38–126)
Bilirubin, Direct: 0.1 mg/dL (ref 0.0–0.3)
Bilirubin, Direct: 0.1 mg/dL (ref 0.0–0.2)
Indirect Bilirubin: 1 mg/dL — ABNORMAL HIGH (ref 0.3–0.9)
Total Bilirubin: 0.8 mg/dL (ref 0.2–1.2)
Total Bilirubin: 1.1 mg/dL (ref 0.3–1.2)
Total Protein: 6.3 g/dL (ref 6.0–8.3)
Total Protein: 6.8 g/dL (ref 6.5–8.1)

## 2019-07-19 LAB — BASIC METABOLIC PANEL
Anion gap: 9 (ref 5–15)
BUN: 14 mg/dL (ref 8–23)
CO2: 27 mmol/L (ref 22–32)
Calcium: 9.2 mg/dL (ref 8.9–10.3)
Chloride: 105 mmol/L (ref 98–111)
Creatinine, Ser: 0.64 mg/dL (ref 0.44–1.00)
GFR calc Af Amer: >60 mL/min (ref >60)
GFR calc non Af Amer: >60 mL/min (ref >60)
Glucose, Bld: 129 mg/dL — ABNORMAL HIGH (ref 70–99)
Potassium: 3.6 mmol/L (ref 3.5–5.1)
Sodium: 141 mmol/L (ref 135–145)

## 2019-07-19 LAB — CBC
HCT: 44 % (ref 36.0–46.0)
Hemoglobin: 15.5 g/dL — ABNORMAL HIGH (ref 12.0–15.0)
MCH: 32.2 pg (ref 26.0–34.0)
MCHC: 35.2 g/dL (ref 30.0–36.0)
MCV: 91.3 fL (ref 80.0–100.0)
Platelets: 201 10*3/uL (ref 150–400)
RBC: 4.82 MIL/uL (ref 3.87–5.11)
RDW: 12.6 % (ref 11.5–15.5)
WBC: 7.9 10*3/uL (ref 4.0–10.5)
nRBC: 0 % (ref 0.0–0.2)

## 2019-07-19 LAB — TROPONIN I (HIGH SENSITIVITY)
Troponin I (High Sensitivity): 6 ng/L (ref <18)
Troponin I (High Sensitivity): 6 ng/L (ref <18)

## 2019-07-19 LAB — GLUCOSE, CAPILLARY: Glucose-Capillary: 109 mg/dL — ABNORMAL HIGH (ref 70–99)

## 2019-07-19 LAB — POTASSIUM: Potassium: 3.7 mEq/L (ref 3.5–5.1)

## 2019-07-19 NOTE — Discharge Instructions (Signed)
Your labs are reassuring.  This could be secondary to your A. fib having sped up but your heart rates have come down since being in the ER and your labs look good.  We discussed admission for syncopal monitoring versus going home.  You prefer to go home with I think is reasonable given your reassuring labs and work-up.  You can follow-up with cardiology if you would like for repeat echo since your last one was 1 year ago and to discuss Holter monitor if he continued to have episodes of near syncope.  If you do pass out he should return to the ER immediately or you have any other concerns

## 2019-07-19 NOTE — ED Provider Notes (Signed)
[facility] Emergency Department Provider Note  ____________________________________________   First MD Initiated Contact with Patient 07/19/19 1439     (approximate)  I have reviewed the triage vital signs and the nursing notes.   HISTORY  Chief Complaint Dizziness and Nausea    HPI Bonnie Shaw is a 69 y.o. female with A. fib on Eliquis, hypothyroidism who comes in for near syncope.  Reviewing the notes patient went into the lab for repeat potassium bilirubin and then went to the bathroom and had a episode where she felt dizzy, shaking, nausea and then passed out.  However I did clarify with patient she is adamant that she never lost consciousness but she felt like she was about to leave consciousness.  Blood pressures were slightly hypertensive.  It appears that they palpate a radial pulse of 148.  According to patient though she stated that it was just a pulse ox that irritating the heart rate often when she is not sure if that was even accurate.  She stated that she felt like she was having anxiety attack because she has had these previously.  She denies any chest pain, shortness of breath has been compliant with her Eliquis.  Denies that she passed out did not hit her head did not lose consciousness.  Denies any abdominal pain.  Denies any urinary symptoms.  States that she is now feeling at her baseline self. Patient states that she is been fasting since yesterday for her labs and she thinks that this could have contributed as well.          Past Medical History:  Diagnosis Date  . Anxiety   . Arthritis   . Cataract   . Complication of anesthesia    Woke up during colonoscopy and during Knee surgery. Also says "nitrous makes me crazy"  . Family history of adverse reaction to anesthesia    son woke up during TEE  . Guillain-Barre ([place]) 2018   recovered  . Hyperlipidemia   . Hypertension   . Hyperthyroidism    s/p RAI therapy  . Hypothyroidism     s/p ablation  . Left atrial enlargement    severely enlarged  . Migraine headache    2x/yr  . Motion sickness    roller coasters  . Obesity   . Panic disorder   . Persistent atrial fibrillation ([place])   . Snoring     Patient Active Problem List   Diagnosis Date Noted  . Colon cancer screening 02/20/2019  . Near syncope 09/27/2017  . Hyperglycemia 04/26/2017  . History of Guillain-Barre syndrome 09/06/2016  . Orthostatic hypotension 07/09/2016  . Vitamin D deficiency 12/30/2015  . Healthcare maintenance 09/12/2015  . Atrial fibrillation ([place]) 12/02/2013  . Anxiety 12/02/2013  . BMI 38.0-38.9,adult 11/09/2013  . Health care maintenance 04/28/2013  . Bronchitis 02/24/2013  . Hyperlipidemia 02/23/2012  . Hypothyroidism 02/20/2011  . Hypertension 02/20/2011    Past Surgical History:  Procedure Laterality Date  . ABDOMINAL HYSTERECTOMY    . CARDIOVERSION N/A 09/11/2017   Procedure: CARDIOVERSION;  Surgeon: Skeet Latch, MD;  Location: [place];  Service: Cardiovascular;  Laterality: N/A;  . CATARACT EXTRACTION    . COLONOSCOPY WITH PROPOFOL N/A 03/11/2019   Procedure: COLONOSCOPY WITH PROPOFOL;  Surgeon: Jonathon Bellows, MD;  Location: [facility];  Service: Endoscopy;  Laterality: N/A;  colon  . KNEE SURGERY Bilateral    replacements  . POLYPECTOMY  03/11/2019   Procedure: POLYPECTOMY;  Surgeon: Jonathon Bellows,  MD;  Location: [facility];  Service: Endoscopy;;  . THYROID ABLATION  2010    Prior to Admission medications   Medication Sig Start Date End Date Taking? Authorizing Provider  atorvastatin (LIPITOR) 20 MG tablet TAKE 1 TABLET BY MOUTH  DAILY 02/02/19   Einar Pheasant, MD  cholecalciferol (VITAMIN D3) 25 MCG (1000 UT) tablet Take 1,000 Units by mouth daily. Taking 3,000mg  daily    [provider]  ELIQUIS 5 MG TABS tablet TAKE 1 TABLET BY MOUTH TWICE DAILY 04/28/19   Allred, Jeneen Rinks, MD  hydrochlorothiazide (HYDRODIURIL) 25 MG tablet TAKE 1  TABLET BY MOUTH  DAILY 04/04/19   Einar Pheasant, MD  levothyroxine (SYNTHROID) 50 MCG tablet TAKE 1 TABLET BY MOUTH  DAILY 02/02/19   Einar Pheasant, MD  potassium chloride (KLOR-CON) 10 MEQ tablet TAKE 1 TABLET BY MOUTH  DAILY 02/02/19   Einar Pheasant, MD  sertraline (ZOLOFT) 50 MG tablet TAKE 1 TABLET BY MOUTH  DAILY 04/04/19   Einar Pheasant, MD    Allergies Ativan [lorazepam], Meloxicam, and Sulfonamide derivatives  Family History  Problem Relation Age of Onset  . Alzheimer's disease Father   . Stroke Mother   . Cancer Sister        ovarian   . Ovarian cancer Sister 47  . Breast cancer Neg Hx     Social History Social History   Tobacco Use  . Smoking status: Never Smoker  . Smokeless tobacco: Never Used  Vaping Use  . Vaping Use: Never used  Substance Use Topics  . Alcohol use: No  . Drug use: No      Review of Systems Constitutional: No fever/chills Eyes: No visual changes. ENT: No sore throat. Cardiovascular: Denies chest pain. Respiratory: Denies shortness of breath. Gastrointestinal: No abdominal pain.  No nausea, no vomiting.  No diarrhea.  No constipation. Genitourinary: Negative for dysuria. Musculoskeletal: Negative for back pain. Skin: Negative for rash. Neurological: Negative for headaches, focal weakness or numbness. All other ROS negative ____________________________________________   PHYSICAL EXAM:  VITAL SIGNS: ED Triage Vitals  Enc Vitals Group     BP 07/19/19 1039 (!) 140/95     Pulse Rate 07/19/19 1039 69     Resp 07/19/19 1039 18     Temp 07/19/19 1039 99 F (37.2 C)     Temp Source 07/19/19 1039 Oral     SpO2 07/19/19 1039 96 %     Weight 07/19/19 1040 220 lb (99.8 kg)     Height 07/19/19 1040 5\' 3"  (1.6 m)     Head Circumference --      Peak Flow --      Pain Score 07/19/19 1043 0     Pain Loc --      Pain Edu? --      Excl. in [place]? --     Constitutional: Alert and oriented. Well appearing and in no acute distress. Eyes:  Conjunctivae are normal. EOMI. Head: Atraumatic. Nose: No congestion/rhinnorhea. Mouth/Throat: Mucous membranes are moist.   Neck: No stridor. Trachea Midline. FROM Cardiovascular: Normal rate, regular rhythm. Grossly normal heart sounds.  Good peripheral circulation. Respiratory: Normal respiratory effort.  No retractions. Lungs CTAB. Gastrointestinal: Soft and nontender. No distention. No abdominal bruits.  Musculoskeletal: No lower extremity tenderness nor edema.  No joint effusions. Neurologic:  Normal speech and language. No gross focal neurologic deficits are appreciated.  Skin:  Skin is warm, dry and intact. No rash noted. Psychiatric: Mood and affect are normal. Speech and behavior  are normal. GU: Deferred   ____________________________________________   LABS (all labs ordered are listed, but only abnormal results are displayed)  Labs Reviewed  BASIC METABOLIC PANEL - Abnormal; Notable for the following components:      Result Value   Glucose, Bld 129 (*)    All other components within normal limits  CBC - Abnormal; Notable for the following components:   Hemoglobin 15.5 (*)    All other components within normal limits  GLUCOSE, CAPILLARY - Abnormal; Notable for the following components:   Glucose-Capillary 109 (*)    All other components within normal limits  URINALYSIS, COMPLETE (UACMP) WITH MICROSCOPIC  CBG MONITORING, ED   ____________________________________________   ED ECG REPORT I, Vanessa Maumee, the attending physician, personally viewed and interpreted this ECG.  A. fib rate of 92, no ST elevation, no T wave inversions, normal intervals ____________________________________________   PROCEDURES  Procedure(s) performed (including Critical Care):  Procedures   ____________________________________________   INITIAL IMPRESSION / ASSESSMENT AND PLAN / ED COURSE  MAIYAH GOYNE was evaluated in Emergency Department on 07/19/2019 for the symptoms  described in the history of present illness. She was evaluated in the context of the global COVID-19 pandemic, which necessitated consideration that the patient might be at risk for infection with the SARS-CoV-2 virus that causes COVID-19. Institutional protocols and algorithms that pertain to the evaluation of patients at risk for COVID-19 are in a state of rapid change based on information released by regulatory bodies including the CDC and federal and state organizations. These policies and algorithms were followed during the patient's care in the ED.    Patient is a well-appearing 69 year old who comes in with known A. fib with concern for near syncopal episodes. Patient attributes a lot of it to her anxiety as well. Will get labs to evaluate electrolyte abnormalities, AKI, cardiac markers to evaluate for ACS although denies any chest pain. She is on a blood thinner no shortness of breath to suggest PE. No abdominal tenderness distress abdominal issues. Denies any urinary symptoms to suggest UTI. Patient's heart rate was reportedly elevated but could have been her A. fib going up versus a more concerning arrhythmia. Discussed this with patient as well. Patient is also been fasting since this morning which could be contributing to her symptoms. Patient requesting some juice and we will provide.  Hemoglobin is stable.  No white count elevation to suggest infection Potassium is 3.6 Cardiac markers negative x2  Reevaluated patient.  Patient was able to ambulate without any dizziness with the nurse.  Orthostatics were negative.  I did call the nurse Juliann Pulse confirmed that patient never had syncope that it was just near syncopal.  Discussed the patient that we could admit patient for cardiac monitoring given the concern for elevated heart rate and this could just been her A. fib versus a more concerning arrhythmia such as V. tach.   Patient states that she is feeling her baseline self now and would prefer to  go home if possible. Think this is reasonable given she is remained asymptomatic and her labs are otherwise reassuring.  Patient states that she would prefer to go home given her symptoms have completely resolved and she thinks that it was just her anxiety that could have contributed to her heart rate speeding up with her known A. fib.  This time she would like to be discharged home. Patient understands follow-up and return precautions and feels comfortable with being discharged home at this time  I discussed the provisional nature of ED diagnosis, the treatment so far, the ongoing plan of care, follow up appointments and return precautions with the patient and any family or support people present. They expressed understanding and agreed with the plan, discharged home.     ____________________________________________   FINAL CLINICAL IMPRESSION(S) / ED DIAGNOSES   Final diagnoses:  Near syncope      MEDICATIONS GIVEN DURING THIS VISIT:  Medications - No data to display   ED Discharge Orders    None       Note:  This document was prepared using Dragon voice recognition software and may include unintentional dictation errors.   Vanessa Zimmerman, MD 07/19/19 1731

## 2019-07-19 NOTE — ED Notes (Addendum)
Pt states that she went to get lab work done and that she's been feeling dizzy and today she felt extra dizzy so the doctor's office sent her over. Pt states at the doctors office, her blood pressure was really high. Patient also states she used to have panic attacks.

## 2019-07-19 NOTE — Addendum Note (Signed)
Addended by: Leeanne Rio on: 07/19/2019 09:15 AM   Modules accepted: Orders

## 2019-07-19 NOTE — Telephone Encounter (Signed)
Thank you for evaluating her and transport to ER.  Pt in ER now.  Per nurses notes, states pt was dizzy.  It appears from your note she passed out.  Please forward info to ER.  Thank you again for your help with this pt.    Dr Nicki Reaper

## 2019-07-19 NOTE — Telephone Encounter (Signed)
Patient in lab for repeat potassium and bilirubin, per RT that alerted this nurse, patient became syncopal in restroom  With complaint of dizziness, uncontrollable shaking, nausea w/o emesis. Lab tech attained BP 180/107 pulse 68 02 sats 96 and alerted nurse.  Patient HX attained patient has HX of A-fib x 2 years per patient treated with only eliquis as prophylaxis.  Patient alert and oriented times 3, skin cool and clammy to touch , patient was able verbalize meds taken today levothyroxine 50 mcg only. Radial pulse 148, 02 sats 96% on room air, respirations, patient stated this feels some like a panic attack , but I have never had my BP this high and not been this dizzy. 02 placed on patient at 2 liters for comfort. Spoke with MD was advised to call EMS to have patient transported to ER. EMS called and report was given to EMT on arrival. 20 minutes later patient was transported.

## 2019-07-19 NOTE — ED Triage Notes (Signed)
Pt in via EMS from [place] with c/o dizziness. Pt went in for routine blood work and then she started to get dizzy so the MD office put her on oxygen and called EMS  153/86, HR94, 97%RA

## 2019-07-20 NOTE — Telephone Encounter (Signed)
Per MY note I should have titled near syncope but as far as I know she did not completely faint and this was relayed By RT that patient fainted in bathroom. After asking RT  she denied witnessing patient faint, so unobserved in restroom. Patient however for more than 15 minutes prior to calling EMS complained she felt she was going to faint and was trembling and shaking in extremities.

## 2019-08-18 ENCOUNTER — Other Ambulatory Visit: Payer: Self-pay

## 2019-08-18 ENCOUNTER — Encounter (HOSPITAL_COMMUNITY): Payer: Self-pay | Admitting: Nurse Practitioner

## 2019-08-18 ENCOUNTER — Ambulatory Visit (HOSPITAL_COMMUNITY)
Admission: RE | Admit: 2019-08-18 | Discharge: 2019-08-18 | Disposition: A | Discharge status: Home / Self Care | Payer: Medicare Other | Source: Ambulatory Visit | Attending: Nurse Practitioner | Admitting: Nurse Practitioner

## 2019-08-18 VITALS — BP 142/100 | HR 94 | Ht 63.0 in | Wt 225.6 lb

## 2019-08-18 DIAGNOSIS — Z886 Allergy status to analgesic agent status: Secondary | ICD-10-CM | POA: Diagnosis not present

## 2019-08-18 DIAGNOSIS — Z882 Allergy status to sulfonamides status: Secondary | ICD-10-CM | POA: Diagnosis not present

## 2019-08-18 DIAGNOSIS — Z7901 Long term (current) use of anticoagulants: Secondary | ICD-10-CM | POA: Diagnosis not present

## 2019-08-18 DIAGNOSIS — G43909 Migraine, unspecified, not intractable, without status migrainosus: Secondary | ICD-10-CM | POA: Diagnosis not present

## 2019-08-18 DIAGNOSIS — E0590 Thyrotoxicosis, unspecified without thyrotoxic crisis or storm: Secondary | ICD-10-CM | POA: Diagnosis not present

## 2019-08-18 DIAGNOSIS — Z79890 Hormone replacement therapy (postmenopausal): Secondary | ICD-10-CM | POA: Diagnosis not present

## 2019-08-18 DIAGNOSIS — E785 Hyperlipidemia, unspecified: Secondary | ICD-10-CM | POA: Insufficient documentation

## 2019-08-18 DIAGNOSIS — M1990 Unspecified osteoarthritis, unspecified site: Secondary | ICD-10-CM | POA: Diagnosis not present

## 2019-08-18 DIAGNOSIS — E669 Obesity, unspecified: Secondary | ICD-10-CM | POA: Diagnosis not present

## 2019-08-18 DIAGNOSIS — F410 Panic disorder [episodic paroxysmal anxiety] without agoraphobia: Secondary | ICD-10-CM | POA: Diagnosis not present

## 2019-08-18 DIAGNOSIS — D6869 Other thrombophilia: Secondary | ICD-10-CM | POA: Diagnosis not present

## 2019-08-18 DIAGNOSIS — I480 Paroxysmal atrial fibrillation: Secondary | ICD-10-CM | POA: Diagnosis not present

## 2019-08-18 DIAGNOSIS — I10 Essential (primary) hypertension: Secondary | ICD-10-CM | POA: Diagnosis not present

## 2019-08-18 DIAGNOSIS — I4821 Permanent atrial fibrillation: Secondary | ICD-10-CM | POA: Diagnosis present

## 2019-08-18 DIAGNOSIS — Z560 Unemployment, unspecified: Secondary | ICD-10-CM | POA: Insufficient documentation

## 2019-08-18 DIAGNOSIS — Z885 Allergy status to narcotic agent status: Secondary | ICD-10-CM | POA: Insufficient documentation

## 2019-08-18 DIAGNOSIS — Z79899 Other long term (current) drug therapy: Secondary | ICD-10-CM | POA: Diagnosis not present

## 2019-08-18 NOTE — Progress Notes (Signed)
Patient ID: Bonnie Shaw, female   DOB: 12-25-1950, 69 y.o.   MRN: [id number]     Primary Care Physician: Einar Pheasant, MD Referring Physician: Dr. Donnie Mesa is a 69 y.o. female with a h/o PAF that was started on flecainide 50 mg bid by Dr. Rayann Heman latter part of June 2017.  She has not had any further afib and doing well tolerating flecainide. HR is usually in 50's at home, tolerating with minimal dizziness. She has had a sleep study and was negative. Echo showed enlargement of left atrium of 50 mm. Despite this, she has done well with minimum breakthrough afib.  She returns to afib clinic 07/29/16. She reports that she had a persistent URI and then started developing neuro symptoms of ataxia and change in sensation with touch. She was admitted to [facility], could not walk the next am and was later transferred to [facility] center for possible [place] which spinal tap confirmed and pt was treat with immunoglobulin  therapy.She was sent to SNF, rehabilitated  quickly and was d/c after 5 days. She did not have any issues with afib while there.  F/u in afib clinic 02/25/17, on last visit she saw Dr. Rayann Heman in December, BB was reduced for bradycardia. Since then she has noted a few episodes of afib, longest x one hour and HR was less than one hundred. She states that she lets her anxiety get the best of her and was afraid these short breakthrough episodes may damage her heart. HR is trending in the 60's with reduction of BB.   F/u in afib clinic, 09/01/17 pt states she feels well but she is in rate controlled afib. She is unaware. Otherwise. No issues  Pt returns to clinic 09/08/17 and ekg confirms that pt is still in afib. She is tolerating well. Discussed increasing flecainide to 100 mg bid and scheduling for cardioversion and she is in agreement.   F/u after cardioversion, 8/12. Unfortunately, it only lasted one day. This time, she was aware that she returned to afib by the use of a  [place]. She really is not that symptomatic. It was discussed at visit to see Dr. Rayann Heman to see if she would be an candidate for  ablation vrs change in antiarrythmic or living in afib as she states that she feels well. I worry about her left atrium size of 56 mm to maintain SR.   F/u 8/28, in afib clinic, for ER visit. She was in church this past Sunday and developed dizziness/pre syncope.  Her son in law who is a PA checked her pulse and it was 45 and regular. She wanted to come to the ER and be evaluated. She was asymptomatic and in rate controlled afib on arrival. Labs were unremarkable. Cardiology was consulted and she was thought to be ok to go home.She however, with her anxiety, did not want to go home and was kept overnight at her insistence/AMA. She was told that she would need a monitor as an outpatient. Metoprolol was stopped. In the office, she is rate controlled but has been super anxious ever since she was told she was in afib.  No further spells.   F/u in afib clinic, 01/14/18. She has chosen to live in  afib. She is tolerating well and has no complaints. Her v rates are elevated in the clinic but at home, the v rates are in the 70's and 80's.  F/u in afib clinic, 11/18/18. She remains in  rate controlled afib and for the most part does not appreciate afib day to day. She has no issues with anticoagulation. No further syncope.   F/u in afib clinic, 7/16. Pt lives  in permanent afib. She  is rate controlled. No issues with DOAC. CHA2DS2VASc score of at least 4. She has been having more issues with panic attacks. She is on Zoloft, does not want to take more meds. She is trying to manage otherwise. She  had a ER visit in June for a panic attack. She states that her daughter lost a baby here at [facility], then had to have an emergent hysterectomy, so all those feelings were brought on today with the visit here to the hospital. BP elevated but usually better at home.   Today, she denies symptoms of  palpitations, chest pain, shortness of breath, orthopnea, PND, lower extremity edema, dizziness, presyncope, syncope, or neurologic sequela. The patient is tolerating medications without difficulties and is otherwise without complaint today.   Past Medical History:  Diagnosis Date  . Anxiety   . Arthritis   . Cataract   . Complication of anesthesia    Woke up during colonoscopy and during Knee surgery. Also says "nitrous makes me crazy"  . Family history of adverse reaction to anesthesia    son woke up during TEE  . 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 Hyperlipidemia   . Hypertension   . Hyperthyroidism    s/p RAI therapy  . Hypothyroidism    s/p ablation  . Left atrial enlargement    severely enlarged  . Migraine headache    2x/yr  . Motion sickness    roller coasters  . Obesity   . Panic disorder   . Persistent atrial fibrillation ([place])   . Snoring    Past Surgical History:  Procedure Laterality Date  . ABDOMINAL HYSTERECTOMY    . CARDIOVERSION N/A 09/11/2017   Procedure: CARDIOVERSION;  Surgeon: Skeet Latch, MD;  Location: [place];  Service: Cardiovascular;  Laterality: N/A;  . CATARACT EXTRACTION    . COLONOSCOPY WITH PROPOFOL N/A 03/11/2019   Procedure: COLONOSCOPY WITH PROPOFOL;  Surgeon: Jonathon Bellows, MD;  Location: [place];  Service: Endoscopy;  Laterality: N/A;  colon  . KNEE SURGERY Bilateral    replacements  . POLYPECTOMY  03/11/2019   Procedure: POLYPECTOMY;  Surgeon: Jonathon Bellows, MD;  Location: [place];  Service: Endoscopy;;  . THYROID ABLATION  2010    Current Outpatient Medications  Medication Sig Dispense Refill  . atorvastatin (LIPITOR) 20 MG tablet TAKE 1 TABLET BY MOUTH  DAILY 90 tablet 3  . cholecalciferol (VITAMIN D3) 25 MCG (1000 UT) tablet Take 1,000 Units by mouth daily. Taking 3,000mg  daily    . ELIQUIS 5 MG TABS tablet TAKE 1 TABLET BY MOUTH TWICE DAILY 180 tablet 1  . hydrochlorothiazide (HYDRODIURIL) 25 MG  tablet TAKE 1 TABLET BY MOUTH  DAILY 90 tablet 3  . levothyroxine (SYNTHROID) 50 MCG tablet TAKE 1 TABLET BY MOUTH  DAILY 90 tablet 3  . potassium chloride (KLOR-CON) 10 MEQ tablet TAKE 1 TABLET BY MOUTH  DAILY 90 tablet 3  . sertraline (ZOLOFT) 50 MG tablet TAKE 1 TABLET BY MOUTH  DAILY 90 tablet 3   No current facility-administered medications for this encounter.    Allergies  Allergen Reactions  . Ativan [Lorazepam] Other (See Comments)    High sensitivity, cause pt to pass out  . Meloxicam Other (See Comments)    Hallucinations  .  Sulfonamide Derivatives Rash    Social History   Socioeconomic History  . Marital status: Married    Spouse name: Not on file  . Number of children: 4  . Years of education: Not on file  . Highest education level: Not on file  Occupational History    Employer: UNEMPLOYED  Tobacco Use  . Smoking status: Never Smoker  . Smokeless tobacco: Never Used  Vaping Use  . Vaping Use: Never used  Substance and Sexual Activity  . Alcohol use: No  . Drug use: No  . Sexual activity: Not on file  Other Topics Concern  . 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   Social Determinants of Radio broadcast assistant Strain:   . Difficulty of Paying Living Expenses:   Food Insecurity:   . Worried About Charity fundraiser in the Last Year:   . Arboriculturist in the Last Year:   Transportation Needs:   . Film/video editor (Medical):   Marland Kitchen Lack of Transportation (Non-Medical):   Physical Activity:   . Days of Exercise per Week:   . Minutes of Exercise per Session:   Stress:   . Feeling of Stress :   Social Connections:   . Frequency of Communication with Friends and Family:   . Frequency of Social Gatherings with Friends and Family:   . Attends Religious  Services:   . Active Member of Clubs or Organizations:   . Attends Archivist Meetings:   Marland Kitchen Marital Status:   Intimate Partner Violence:   . Fear of Current or Ex-Partner:   . Emotionally Abused:   Marland Kitchen Physically Abused:   . Sexually Abused:     Family History  Problem Relation Age of Onset  . Alzheimer's disease Father   . Stroke Mother   . Cancer Sister        ovarian   . Ovarian cancer Sister 69  . Breast cancer Neg Hx     ROS- All systems are reviewed and negative except as per the HPI above  Physical Exam: Vitals:   08/18/19 1032  BP: (!) 142/100  Pulse: 94  Weight: 102.3 kg  Height: 5\' 3"  (1.6 m)    GEN- The patient is well appearing, alert and oriented x 3 today.   Head- normocephalic, atraumatic Eyes-  Sclera clear, conjunctiva pink Ears- hearing intact Oropharynx- clear Neck- supple, no JVP Lymph- no cervical lymphadenopathy Lungs- Clear to ausculation bilaterally, normal work of breathing Heart- irregular rate and rhythm, no murmurs, rubs or gallops, PMI not laterally displaced GI- soft, NT, ND, + BS Extremities- no clubbing, cyanosis, or edema MS- no significant deformity or atrophy Skin- no rash or lesion Psych- euthymic mood, full affect Neuro- strength and sensation are intact  EKG- afib at 94 bpm, qrs int 66 ms, qtc 478 ms Epic records reviewed Echo -8/19-Study Conclusions  - Left ventricle: The cavity size was normal. There was mild focal   basal hypertrophy of the septum. Systolic function was normal.   The estimated ejection fraction was in the range of 55% to 60%.   Wall motion was normal; there were no regional wall motion   abnormalities. Features are consistent with a pseudonormal left  ventricular filling pattern, with concomitant abnormal relaxation   and increased filling pressure (grade 2 diastolic dysfunction). - Mitral valve: Calcified annulus. Mildly thickened leaflets .   There was mild regurgitation. - Left atrium:  The atrium was severely dilated. Volume/bsa, S: 59.6   ml/m^2.56 mm - Tricuspid valve: There was trivial regurgitation.  Assessment and Plan: 1. Permanent  afib She remains  rate controlled without any rate control on board BB was stopped in the hospital after a  near  syncopal episode in  2019  Now off flecainide as well  She is clear in her decision that she does not want any rate control meds nor further attempts to restore SR She was felt not to be a candidate for ablation 2/2  size of left atrium and paucity of symptoms  Continue on eliquis 5 mg bid for CHA2DS2VASc score of 3  2. HTN Elevated on presentation Recheck 138/88 She will monitor at home   F/u  as needed   [place]. Tegan Britain, [facility] [address] [place], [place] [address] [phone]

## 2019-08-22 ENCOUNTER — Encounter: Payer: Self-pay | Admitting: Internal Medicine

## 2019-08-23 MED ORDER — ALPRAZOLAM 0.25 MG PO TABS
ORAL_TABLET | ORAL | 0 refills | Status: DC
Start: 2019-08-23 — End: 2020-09-14

## 2019-08-23 NOTE — Telephone Encounter (Signed)
Clarified able to take xanax.  rx sent in for xanax #5 with no refills.

## 2019-09-14 ENCOUNTER — Ambulatory Visit: Payer: Medicare Other

## 2019-09-20 ENCOUNTER — Telehealth: Payer: Self-pay | Admitting: Internal Medicine

## 2019-09-20 NOTE — Telephone Encounter (Signed)
Left message telling pt that she started Part B Medicare on 03/07/2019 and that she is not eligible for her AWV with [facility] until 03/06/2020.  Apologized for any confusion   Ebony Hail

## 2019-10-08 ENCOUNTER — Other Ambulatory Visit: Payer: Self-pay | Admitting: Nurse Practitioner

## 2019-10-11 ENCOUNTER — Encounter: Payer: Self-pay | Admitting: Internal Medicine

## 2019-10-11 NOTE — Telephone Encounter (Signed)
Prescription refill request for Eliquis received. Indication:afib Last office visit:08/18/19 (afib clinic) Scr:0.64 on 07/19/19 Age: 69 Weight: 102

## 2019-10-12 ENCOUNTER — Other Ambulatory Visit: Payer: Self-pay

## 2019-10-12 ENCOUNTER — Other Ambulatory Visit: Payer: Self-pay | Admitting: Internal Medicine

## 2019-10-12 MED ORDER — POTASSIUM CHLORIDE CRYS ER 10 MEQ PO TBCR: 10.0000 meq | EXTENDED_RELEASE_TABLET | Freq: Two times a day (BID) | ORAL | 0 refills | Status: DC

## 2019-10-12 NOTE — Telephone Encounter (Signed)
Pt needs potassium chloride (KLOR-CON) 10 MEQ tablet sent to [facility] until order for [facility] is fixed. Please advise

## 2019-10-19 ENCOUNTER — Telehealth: Payer: Self-pay | Admitting: *Deleted

## 2019-10-19 DIAGNOSIS — E785 Hyperlipidemia, unspecified: Secondary | ICD-10-CM

## 2019-10-19 DIAGNOSIS — E039 Hypothyroidism, unspecified: Secondary | ICD-10-CM

## 2019-10-19 DIAGNOSIS — R739 Hyperglycemia, unspecified: Secondary | ICD-10-CM

## 2019-10-19 DIAGNOSIS — E559 Vitamin D deficiency, unspecified: Secondary | ICD-10-CM

## 2019-10-19 DIAGNOSIS — I10 Essential (primary) hypertension: Secondary | ICD-10-CM

## 2019-10-19 NOTE — Telephone Encounter (Signed)
Please place future orders for lab appt.  

## 2019-10-20 NOTE — Telephone Encounter (Signed)
Orders placed for labs

## 2019-10-21 ENCOUNTER — Other Ambulatory Visit: Payer: Self-pay

## 2019-10-21 ENCOUNTER — Other Ambulatory Visit (INDEPENDENT_AMBULATORY_CARE_PROVIDER_SITE_OTHER): Payer: Medicare Other

## 2019-10-21 DIAGNOSIS — I10 Essential (primary) hypertension: Secondary | ICD-10-CM

## 2019-10-21 DIAGNOSIS — E039 Hypothyroidism, unspecified: Secondary | ICD-10-CM | POA: Diagnosis not present

## 2019-10-21 DIAGNOSIS — E559 Vitamin D deficiency, unspecified: Secondary | ICD-10-CM

## 2019-10-21 DIAGNOSIS — E785 Hyperlipidemia, unspecified: Secondary | ICD-10-CM

## 2019-10-21 DIAGNOSIS — R739 Hyperglycemia, unspecified: Secondary | ICD-10-CM | POA: Diagnosis not present

## 2019-10-21 LAB — CBC WITH DIFFERENTIAL/PLATELET
Basophils Absolute: 0.1 10*3/uL (ref 0.0–0.1)
Basophils Relative: 1.1 % (ref 0.0–3.0)
Eosinophils Absolute: 0.1 10*3/uL (ref 0.0–0.7)
Eosinophils Relative: 1.5 % (ref 0.0–5.0)
HCT: 43.1 % (ref 36.0–46.0)
Hemoglobin: 14.9 g/dL (ref 12.0–15.0)
Lymphocytes Relative: 38.6 % (ref 12.0–46.0)
Lymphs Abs: 3.3 10*3/uL (ref 0.7–4.0)
MCHC: 34.7 g/dL (ref 30.0–36.0)
MCV: 93.9 fl (ref 78.0–100.0)
Monocytes Absolute: 0.8 10*3/uL (ref 0.1–1.0)
Monocytes Relative: 9.9 % (ref 3.0–12.0)
Neutro Abs: 4.2 10*3/uL (ref 1.4–7.7)
Neutrophils Relative %: 48.9 % (ref 43.0–77.0)
Platelets: 212 10*3/uL (ref 150.0–400.0)
RBC: 4.59 Mil/uL (ref 3.87–5.11)
RDW: 13.2 % (ref 11.5–15.5)
WBC: 8.5 10*3/uL (ref 4.0–10.5)

## 2019-10-21 LAB — HEPATIC FUNCTION PANEL
ALT: 12 U/L (ref 0–35)
AST: 13 U/L (ref 0–37)
Albumin: 4.2 g/dL (ref 3.5–5.2)
Alkaline Phosphatase: 56 U/L (ref 39–117)
Bilirubin, Direct: 0.2 mg/dL (ref 0.0–0.3)
Total Bilirubin: 1.5 mg/dL — ABNORMAL HIGH (ref 0.2–1.2)
Total Protein: 6.4 g/dL (ref 6.0–8.3)

## 2019-10-21 LAB — BASIC METABOLIC PANEL
BUN: 13 mg/dL (ref 6–23)
CO2: 29 mEq/L (ref 19–32)
Calcium: 9.2 mg/dL (ref 8.4–10.5)
Chloride: 104 mEq/L (ref 96–112)
Creatinine, Ser: 0.8 mg/dL (ref 0.40–1.20)
GFR: 70.9 mL/min (ref >60.00)
Glucose, Bld: 101 mg/dL — ABNORMAL HIGH (ref 70–99)
Potassium: 3.6 mEq/L (ref 3.5–5.1)
Sodium: 142 mEq/L (ref 135–145)

## 2019-10-21 LAB — LIPID PANEL
Cholesterol: 154 mg/dL (ref 0–200)
HDL: 39.2 mg/dL (ref >39.00)
LDL Cholesterol: 82 mg/dL (ref 0–99)
NonHDL: 114.6
Total CHOL/HDL Ratio: 4
Triglycerides: 165 mg/dL — ABNORMAL HIGH (ref 0.0–149.0)
VLDL: 33 mg/dL (ref 0.0–40.0)

## 2019-10-21 LAB — VITAMIN D 25 HYDROXY (VIT D DEFICIENCY, FRACTURES): VITD: 30.58 ng/mL (ref 30.00–100.00)

## 2019-10-21 LAB — TSH: TSH: 3.36 u[IU]/mL (ref 0.35–4.50)

## 2019-10-21 LAB — HEMOGLOBIN A1C: Hgb A1c MFr Bld: 5.9 % (ref 4.6–6.5)

## 2019-10-31 ENCOUNTER — Other Ambulatory Visit: Payer: Self-pay

## 2019-10-31 MED ORDER — POTASSIUM CHLORIDE CRYS ER 10 MEQ PO TBCR: EXTENDED_RELEASE_TABLET | ORAL | 0 refills | Status: DC

## 2019-10-31 NOTE — Telephone Encounter (Signed)
Pt said her issue with her prescription is not fixed and that all her prescriptions should go to [facility] not [facility]. She said that the price of the medication that was sent to [facility] for her potassium chloride. She assumed the remaining would be sent to [facility] and this was not done and now she is completely out of medication. She would like a call today and the medication sent today.

## 2019-10-31 NOTE — Telephone Encounter (Signed)
There was confusion with her prescription. Clarified with patient that new script was [facility]. She is not out. Has some at home. Nothing further needed at this time.

## 2019-11-02 ENCOUNTER — Ambulatory Visit (INDEPENDENT_AMBULATORY_CARE_PROVIDER_SITE_OTHER): Payer: Medicare Other | Admitting: Internal Medicine

## 2019-11-02 ENCOUNTER — Encounter: Payer: Self-pay | Admitting: Internal Medicine

## 2019-11-02 ENCOUNTER — Other Ambulatory Visit: Payer: Self-pay

## 2019-11-02 DIAGNOSIS — R198 Other specified symptoms and signs involving the digestive system and abdomen: Secondary | ICD-10-CM

## 2019-11-02 DIAGNOSIS — E039 Hypothyroidism, unspecified: Secondary | ICD-10-CM

## 2019-11-02 DIAGNOSIS — E559 Vitamin D deficiency, unspecified: Secondary | ICD-10-CM

## 2019-11-02 DIAGNOSIS — I4821 Permanent atrial fibrillation: Secondary | ICD-10-CM

## 2019-11-02 DIAGNOSIS — I10 Essential (primary) hypertension: Secondary | ICD-10-CM

## 2019-11-02 DIAGNOSIS — E785 Hyperlipidemia, unspecified: Secondary | ICD-10-CM

## 2019-11-02 DIAGNOSIS — R739 Hyperglycemia, unspecified: Secondary | ICD-10-CM

## 2019-11-02 DIAGNOSIS — Z23 Encounter for immunization: Secondary | ICD-10-CM | POA: Diagnosis not present

## 2019-11-02 DIAGNOSIS — F419 Anxiety disorder, unspecified: Secondary | ICD-10-CM

## 2019-11-02 NOTE — Progress Notes (Signed)
Patient ID: Bonnie Shaw, female   DOB: Feb 02, 1951, 69 y.o.   MRN: [id number]   Subjective:    Patient ID: Bonnie Shaw, female    DOB: [birth year], 69 y.o.   MRN: [id number]  HPI This visit occurred during the SARS-CoV-2 public health emergency.  Safety protocols were in place, including screening questions prior to the visit, additional usage of staff PPE, and extensive cleaning of exam room while observing appropriate contact time as indicated for disinfecting solutions.  Patient here for a scheduled follow up.  Here to follow regarding her afib, blood pressure and cholesterol.  States she is dong relatively well. Increased stress. Discussed. Overall she feels she is doing well. Does not feel she needs any further intervention.  She is exercising more.  Feels heart - stable.  No increased cough or congestion.  No sob.  Does report consistency of bowels has changed.  Bowels are shorter and softer.  No blood.  No abdominal pain.  Had colonoscopy 03/2019 - one polyp.    Past Medical History:  Diagnosis Date  . Anxiety   . Arthritis   . Cataract   . Complication of anesthesia    Woke up during colonoscopy and during Knee surgery. Also says "nitrous makes me crazy"  . Family history of adverse reaction to anesthesia    son woke up during TEE  . 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 Hyperlipidemia   . Hypertension   . Hyperthyroidism    s/p RAI therapy  . Hypothyroidism    s/p ablation  . Left atrial enlargement    severely enlarged  . Migraine headache    2x/yr  . Motion sickness    roller coasters  . Obesity   . Panic disorder   . Persistent atrial fibrillation ([place])   . Snoring    Past Surgical History:  Procedure Laterality Date  . ABDOMINAL HYSTERECTOMY    . CARDIOVERSION N/A 09/11/2017   Procedure: CARDIOVERSION;  Surgeon: Skeet Latch, MD;  Location: [place];  Service: Cardiovascular;  Laterality: N/A;  . CATARACT EXTRACTION    . COLONOSCOPY WITH PROPOFOL  N/A 03/11/2019   Procedure: COLONOSCOPY WITH PROPOFOL;  Surgeon: Jonathon Bellows, MD;  Location: [place];  Service: Endoscopy;  Laterality: N/A;  colon  . KNEE SURGERY Bilateral    replacements  . POLYPECTOMY  03/11/2019   Procedure: POLYPECTOMY;  Surgeon: Jonathon Bellows, MD;  Location: [place];  Service: Endoscopy;;  . THYROID ABLATION  2010   Family History  Problem Relation Age of Onset  . Alzheimer's disease Father   . Stroke Mother   . Cancer Sister        ovarian   . Ovarian cancer Sister 13  . Breast cancer Neg Hx    Social History   Socioeconomic History  . Marital status: Married    Spouse name: Not on file  . Number of children: 4  . Years of education: Not on file  . Highest education level: Not on file  Occupational History    Employer: UNEMPLOYED  Tobacco Use  . Smoking status: Never Smoker  . Smokeless tobacco: Never Used  Vaping Use  . Vaping Use: Never used  Substance and Sexual Activity  . Alcohol use: No  . Drug use: No  . Sexual activity: Not on file  Other Topics Concern  . 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   Social Determinants of Radio broadcast assistant Strain:   . Difficulty of Paying Living Expenses: Not on file  Food Insecurity:   . Worried About Charity fundraiser in the Last Year: Not on file  . Ran Out of Food in the Last Year: Not on file  Transportation Needs:   . Lack of Transportation (Medical): Not on file  . Lack of Transportation (Non-Medical): Not on file  Physical Activity:   . Days of Exercise per Week: Not on file  . Minutes of Exercise per Session: Not on file  Stress:   . Feeling of Stress : Not on file  Social Connections:   . Frequency of Communication with Friends and Family: Not on file  . Frequency of  Social Gatherings with Friends and Family: Not on file  . Attends Religious Services: Not on file  . Active Member of Clubs or Organizations: Not on file  . Attends Archivist Meetings: Not on file  . Marital Status: Not on file    Outpatient Encounter Medications as of 11/02/2019  Medication Sig  . ALPRAZolam (XANAX) 0.25 MG tablet Take one tablet as directed prior to procedure.  Marland Kitchen atorvastatin (LIPITOR) 20 MG tablet TAKE 1 TABLET BY MOUTH  DAILY  . cholecalciferol (VITAMIN D3) 25 MCG (1000 UT) tablet Take 1,000 Units by mouth daily. Taking 3,073m daily  . ELIQUIS 5 MG TABS tablet TAKE 1 TABLET BY MOUTH  TWICE DAILY  . hydrochlorothiazide (HYDRODIURIL) 25 MG tablet TAKE 1 TABLET BY MOUTH  DAILY  . levothyroxine (SYNTHROID) 50 MCG tablet TAKE 1 TABLET BY MOUTH  DAILY  . potassium chloride (KLOR-CON) 10 MEQ tablet TAKE 1 TABLET(10 MEQ) BY MOUTH TWICE DAILY  . sertraline (ZOLOFT) 50 MG tablet TAKE 1 TABLET BY MOUTH  DAILY   No facility-administered encounter medications on file as of 11/02/2019.    Review of Systems  Constitutional: Negative for appetite change and unexpected weight change.  HENT: Negative for congestion and sinus pressure.   Respiratory: Negative for cough, chest tightness and shortness of breath.   Cardiovascular: Negative for chest pain, palpitations and leg swelling.  Gastrointestinal: Negative for abdominal pain, blood in stool, nausea and vomiting.       Stool consistency has changed as outlined.    Genitourinary: Negative for difficulty urinating and dysuria.  Musculoskeletal: Negative for joint swelling and myalgias.  Neurological: Negative for dizziness, light-headedness and headaches.  Psychiatric/Behavioral: Negative for agitation and dysphoric mood.       Objective:    Physical Exam Vitals reviewed.  Constitutional:      General: She is not in acute distress.    Appearance: Normal appearance.  HENT:     Head: Normocephalic and atraumatic.      Right Ear: External ear normal.     Left Ear: External ear normal.  Eyes:     General: No scleral icterus.       Right eye: No discharge.        Left eye: No discharge.     Conjunctiva/sclera: Conjunctivae normal.  Neck:     Thyroid: No thyromegaly.  Cardiovascular:     Rate and Rhythm: Normal rate and regular rhythm.  Pulmonary:     Effort: No respiratory distress.     Breath sounds: Normal breath sounds. No wheezing.  Abdominal:  General: Bowel sounds are normal.     Palpations: Abdomen is soft.     Tenderness: There is no abdominal tenderness.  Musculoskeletal:        General: No swelling or tenderness.     Cervical back: Neck supple. No tenderness.  Lymphadenopathy:     Cervical: No cervical adenopathy.  Skin:    Findings: No erythema or rash.  Neurological:     Mental Status: She is alert.  Psychiatric:        Mood and Affect: Mood normal.        Behavior: Behavior normal.     BP 138/80   Pulse 70   Temp 98.2 F (36.8 C) (Oral)   Resp 16   Ht 5' 3"  (1.6 m)   Wt 222 lb (100.7 kg)   SpO2 99%   BMI 39.33 kg/m  Wt Readings from Last 3 Encounters:  11/02/19 222 lb (100.7 kg)  08/18/19 225 lb 9.6 oz (102.3 kg)  07/19/19 220 lb (99.8 kg)     Lab Results  Component Value Date   WBC 8.5 10/21/2019   HGB 14.9 10/21/2019   HCT 43.1 10/21/2019   PLT 212.0 10/21/2019   GLUCOSE 101 (H) 10/21/2019   CHOL 154 10/21/2019   TRIG 165.0 (H) 10/21/2019   HDL 39.20 10/21/2019   LDLDIRECT 97.0 11/01/2018   LDLCALC 82 10/21/2019   ALT 12 10/21/2019   AST 13 10/21/2019   NA 142 10/21/2019   K 3.6 10/21/2019   CL 104 10/21/2019   CREATININE 0.80 10/21/2019   BUN 13 10/21/2019   CO2 29 10/21/2019   TSH 3.36 10/21/2019   HGBA1C 5.9 10/21/2019   MICROALBUR <0.7 06/11/2015       Assessment & Plan:   Problem List Items Addressed This Visit    Vitamin D deficiency    Follow vitamin D level.       Hypothyroidism    On thyroid replacement.  Follow tsh.         Hypertension    Blood pressure as outlined.  Continue to follow.  Continues on hctz.  Follow pressures.  Follow metabolic panel.       Hyperlipidemia    On lipitor.  Low cholesterol diet and exercise.  Follow lipid panel and liver function tests.        Hyperglycemia    Low carb diet and exercise.  Follow met b and a1c.       Hyperbilirubinemia    Slightly increased bilirubin.  Remainder of liver panel wnl.  Recheck in the next several weeks. Probably normal variant.  Follow.        Relevant Orders   Hepatic function panel   Change in bowel movement    Change in bowel movement as outlined.  Just had colonoscopy.  See if any food triggers. Discussed probiotics/fiber.  Follow.        Atrial fibrillation ([place])    On eliquis.  Rated controlled.  Doing well.  Feels things are stable.       Anxiety    On zoloft.  Doing well.  Follow.         Other Visit Diagnoses    Need for immunization against influenza       Relevant Orders   Flu Vaccine QUAD High Dose(Fluad) (Completed)       Einar Pheasant, MD

## 2019-11-07 ENCOUNTER — Telehealth: Payer: Self-pay | Admitting: Internal Medicine

## 2019-11-07 NOTE — Telephone Encounter (Signed)
I have spoke with patient to see what was going on and called the pharmacy to get things straighten out.  The pharmacy stated that they faxed over a clarification form but I was able to resolve the discrepancy verbally. Pt has been notified.

## 2019-11-07 NOTE — Telephone Encounter (Signed)
Pharmacy called to get clarification on potassium chloride (KLOR-CON) 10 MEQ tablet.. pt told them that Dr.Scott was changing this medication to a new one  Please contact # [phone] order #[id number]

## 2019-11-07 NOTE — Telephone Encounter (Signed)
Patient called office back at 2:58pm. She is out of this medication and she is not happy about how this is being taken care of.

## 2019-11-13 ENCOUNTER — Encounter: Payer: Self-pay | Admitting: Internal Medicine

## 2019-11-13 DIAGNOSIS — R198 Other specified symptoms and signs involving the digestive system and abdomen: Secondary | ICD-10-CM | POA: Insufficient documentation

## 2019-11-13 NOTE — Assessment & Plan Note (Signed)
Change in bowel movement as outlined.  Just had colonoscopy.  See if any food triggers. Discussed probiotics/fiber.  Follow.

## 2019-11-13 NOTE — Assessment & Plan Note (Signed)
Blood pressure as outlined.  Continue to follow.  Continues on hctz.  Follow pressures.  Follow metabolic panel.

## 2019-11-13 NOTE — Assessment & Plan Note (Signed)
Follow vitamin D level.  

## 2019-11-13 NOTE — Assessment & Plan Note (Signed)
Slightly increased bilirubin.  Remainder of liver panel wnl.  Recheck in the next several weeks. Probably normal variant.  Follow.

## 2019-11-13 NOTE — Assessment & Plan Note (Signed)
On thyroid replacement.  Follow tsh.  

## 2019-11-13 NOTE — Assessment & Plan Note (Signed)
On zoloft.  Doing well.  Follow.  

## 2019-11-13 NOTE — Assessment & Plan Note (Signed)
On eliquis.  Rated controlled.  Doing well.  Feels things are stable.

## 2019-11-13 NOTE — Assessment & Plan Note (Signed)
Low carb diet and exercise.  Follow met b and a1c.  

## 2019-11-13 NOTE — Assessment & Plan Note (Signed)
On lipitor.  Low cholesterol diet and exercise.  Follow lipid panel and liver function tests.   

## 2019-11-30 ENCOUNTER — Other Ambulatory Visit: Payer: Self-pay

## 2019-11-30 ENCOUNTER — Other Ambulatory Visit (INDEPENDENT_AMBULATORY_CARE_PROVIDER_SITE_OTHER): Payer: Medicare Other

## 2019-11-30 LAB — HEPATIC FUNCTION PANEL
ALT: 13 U/L (ref 0–35)
AST: 12 U/L (ref 0–37)
Albumin: 4 g/dL (ref 3.5–5.2)
Alkaline Phosphatase: 61 U/L (ref 39–117)
Bilirubin, Direct: 0.1 mg/dL (ref 0.0–0.3)
Total Bilirubin: 1 mg/dL (ref 0.2–1.2)
Total Protein: 6.1 g/dL (ref 6.0–8.3)

## 2019-12-25 ENCOUNTER — Other Ambulatory Visit: Payer: Self-pay | Admitting: Internal Medicine

## 2020-01-04 ENCOUNTER — Telehealth: Payer: Self-pay

## 2020-01-04 MED ORDER — POTASSIUM CHLORIDE CRYS ER 10 MEQ PO TBCR: EXTENDED_RELEASE_TABLET | ORAL | 0 refills | Status: DC

## 2020-01-04 NOTE — Telephone Encounter (Signed)
Pt said she noticed she doesn't have any refills on potassium chloride and she would like to make sure it is sent to [facility] before they run out.

## 2020-02-11 ENCOUNTER — Other Ambulatory Visit: Payer: Self-pay | Admitting: Internal Medicine

## 2020-02-26 ENCOUNTER — Other Ambulatory Visit: Payer: Self-pay | Admitting: Internal Medicine

## 2020-02-27 MED ORDER — APIXABAN 5 MG PO TABS: 5.0000 mg | ORAL_TABLET | Freq: Two times a day (BID) | ORAL | 1 refills | Status: DC

## 2020-02-27 NOTE — Addendum Note (Signed)
Addended by: Johny Shock B on: 02/27/2020 03:06 PM   Modules accepted: Orders

## 2020-02-27 NOTE — Telephone Encounter (Signed)
Prescription refill request for Eliquis received.  Indication: Afib Last office visit: Kayleen Memos 08/18/2019 Scr: 0.80, 10/21/2019 Age: 70  Weight: 100.7 kg   Prescription refill sent.

## 2020-02-29 ENCOUNTER — Other Ambulatory Visit: Payer: Self-pay | Admitting: Internal Medicine

## 2020-03-01 ENCOUNTER — Telehealth: Payer: Self-pay | Admitting: *Deleted

## 2020-03-01 DIAGNOSIS — E785 Hyperlipidemia, unspecified: Secondary | ICD-10-CM

## 2020-03-01 DIAGNOSIS — R739 Hyperglycemia, unspecified: Secondary | ICD-10-CM

## 2020-03-01 DIAGNOSIS — I10 Essential (primary) hypertension: Secondary | ICD-10-CM

## 2020-03-01 NOTE — Telephone Encounter (Signed)
Orders placed for f/u labs.  

## 2020-03-01 NOTE — Telephone Encounter (Signed)
Please place future orders for lab appt.  

## 2020-03-02 ENCOUNTER — Other Ambulatory Visit: Payer: Self-pay

## 2020-03-02 ENCOUNTER — Other Ambulatory Visit (INDEPENDENT_AMBULATORY_CARE_PROVIDER_SITE_OTHER): Payer: Medicare Other

## 2020-03-02 DIAGNOSIS — R739 Hyperglycemia, unspecified: Secondary | ICD-10-CM

## 2020-03-02 DIAGNOSIS — I10 Essential (primary) hypertension: Secondary | ICD-10-CM | POA: Diagnosis not present

## 2020-03-02 DIAGNOSIS — E785 Hyperlipidemia, unspecified: Secondary | ICD-10-CM | POA: Diagnosis not present

## 2020-03-02 LAB — HEPATIC FUNCTION PANEL
ALT: 14 U/L (ref 0–35)
AST: 13 U/L (ref 0–37)
Albumin: 4.2 g/dL (ref 3.5–5.2)
Alkaline Phosphatase: 61 U/L (ref 39–117)
Bilirubin, Direct: 0.2 mg/dL (ref 0.0–0.3)
Total Bilirubin: 1.4 mg/dL — ABNORMAL HIGH (ref 0.2–1.2)
Total Protein: 6.5 g/dL (ref 6.0–8.3)

## 2020-03-02 LAB — BASIC METABOLIC PANEL
BUN: 15 mg/dL (ref 6–23)
CO2: 30 mEq/L (ref 19–32)
Calcium: 9.5 mg/dL (ref 8.4–10.5)
Chloride: 104 mEq/L (ref 96–112)
Creatinine, Ser: 0.81 mg/dL (ref 0.40–1.20)
GFR: 73.54 mL/min (ref >60.00)
Glucose, Bld: 108 mg/dL — ABNORMAL HIGH (ref 70–99)
Potassium: 4.4 mEq/L (ref 3.5–5.1)
Sodium: 141 mEq/L (ref 135–145)

## 2020-03-02 LAB — LIPID PANEL
Cholesterol: 177 mg/dL (ref 0–200)
HDL: 43.7 mg/dL (ref >39.00)
LDL Cholesterol: 102 mg/dL — ABNORMAL HIGH (ref 0–99)
NonHDL: 133.45
Total CHOL/HDL Ratio: 4
Triglycerides: 159 mg/dL — ABNORMAL HIGH (ref 0.0–149.0)
VLDL: 31.8 mg/dL (ref 0.0–40.0)

## 2020-03-02 LAB — HEMOGLOBIN A1C: Hgb A1c MFr Bld: 6 % (ref 4.6–6.5)

## 2020-03-06 ENCOUNTER — Encounter: Payer: Self-pay | Admitting: Internal Medicine

## 2020-03-06 ENCOUNTER — Ambulatory Visit (INDEPENDENT_AMBULATORY_CARE_PROVIDER_SITE_OTHER): Payer: Medicare Other | Admitting: Internal Medicine

## 2020-03-06 ENCOUNTER — Other Ambulatory Visit: Payer: Self-pay

## 2020-03-06 VITALS — BP 130/82 | HR 87 | Temp 97.6°F | Ht 63.0 in | Wt 230.0 lb

## 2020-03-06 DIAGNOSIS — Z1231 Encounter for screening mammogram for malignant neoplasm of breast: Secondary | ICD-10-CM

## 2020-03-06 DIAGNOSIS — E039 Hypothyroidism, unspecified: Secondary | ICD-10-CM

## 2020-03-06 DIAGNOSIS — F419 Anxiety disorder, unspecified: Secondary | ICD-10-CM

## 2020-03-06 DIAGNOSIS — I4821 Permanent atrial fibrillation: Secondary | ICD-10-CM

## 2020-03-06 DIAGNOSIS — E2839 Other primary ovarian failure: Secondary | ICD-10-CM

## 2020-03-06 DIAGNOSIS — E785 Hyperlipidemia, unspecified: Secondary | ICD-10-CM

## 2020-03-06 DIAGNOSIS — I10 Essential (primary) hypertension: Secondary | ICD-10-CM

## 2020-03-06 DIAGNOSIS — R739 Hyperglycemia, unspecified: Secondary | ICD-10-CM

## 2020-03-06 DIAGNOSIS — Z0000 Encounter for general adult medical examination without abnormal findings: Secondary | ICD-10-CM | POA: Diagnosis not present

## 2020-03-06 NOTE — Progress Notes (Signed)
Patient ID: Bonnie Shaw, female   DOB: 12-04-1950, 70 y.o.   MRN: [id number]   Subjective:    Patient ID: Bonnie Shaw, female    DOB: March 10, 1950, 70 y.o.   MRN: [id number]  HPI This visit occurred during the SARS-CoV-2 public health emergency.  Safety protocols were in place, including screening questions prior to the visit, additional usage of staff PPE, and extensive cleaning of exam room while observing appropriate contact time as indicated for disinfecting solutions.  Patient here for her physical exam.  She reports increased stress.  Discussed with her today.  Has good support.  Does not feel needs anything more at this time.  Previously having back/leg - discomfort - chiropractor.  Better. Riding exercise bike.  No chest pain or sob reported.  No acid reflux or abdominal reported.  Bowels moving.  Discussed labs.  Discussed diet and exercise.     Past Medical History:  Diagnosis Date  . Anxiety   . Arthritis   . Cataract   . Complication of anesthesia    Woke up during colonoscopy and during Knee surgery. Also says "nitrous makes me crazy"  . Family history of adverse reaction to anesthesia    son woke up during TEE  . 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 Hyperlipidemia   . Hypertension   . Hyperthyroidism    s/p RAI therapy  . Hypothyroidism    s/p ablation  . Left atrial enlargement    severely enlarged  . Migraine headache    2x/yr  . Motion sickness    roller coasters  . Obesity   . Panic disorder   . Persistent atrial fibrillation ([place])   . Snoring    Past Surgical History:  Procedure Laterality Date  . ABDOMINAL HYSTERECTOMY    . CARDIOVERSION N/A 09/11/2017   Procedure: CARDIOVERSION;  Surgeon: Skeet Latch, MD;  Location: [facility];  Service: Cardiovascular;  Laterality: N/A;  . CATARACT EXTRACTION    . COLONOSCOPY WITH PROPOFOL N/A 03/11/2019   Procedure: COLONOSCOPY WITH PROPOFOL;  Surgeon: Jonathon Bellows, MD;  Location: [facility];   Service: Endoscopy;  Laterality: N/A;  colon  . KNEE SURGERY Bilateral    replacements  . POLYPECTOMY  03/11/2019   Procedure: POLYPECTOMY;  Surgeon: Jonathon Bellows, MD;  Location: [facility];  Service: Endoscopy;;  . THYROID ABLATION  2010   Family History  Problem Relation Age of Onset  . Alzheimer's disease Father   . Stroke Mother   . Cancer Sister        ovarian   . Ovarian cancer Sister 66  . Breast cancer Neg Hx    Social History   Socioeconomic History  . Marital status: Married    Spouse name: Not on file  . Number of children: 4  . Years of education: Not on file  . Highest education level: Not on file  Occupational History    Employer: UNEMPLOYED  Tobacco Use  . Smoking status: Never Smoker  . Smokeless tobacco: Never Used  Vaping Use  . Vaping Use: Never used  Substance and Sexual Activity  . Alcohol use: No  . Drug use: No  . Sexual activity: Not on file  Other Topics Concern  . 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   Social Determinants of Health   Financial Resource Strain: Not on file  Food Insecurity: Not on file  Transportation Needs: Not on file  Physical Activity: Not on file  Stress: Not on file  Social Connections: Not on file    Outpatient Encounter Medications as of 03/06/2020  Medication Sig  . ALPRAZolam (XANAX) 0.25 MG tablet Take one tablet as directed prior to procedure.  Marland Kitchen apixaban (ELIQUIS) 5 MG TABS tablet Take 1 tablet (5 mg total) by mouth 2 (two) times daily.  Marland Kitchen atorvastatin (LIPITOR) 20 MG tablet TAKE 1 TABLET BY MOUTH  DAILY  . cholecalciferol (VITAMIN D3) 25 MCG (1000 UT) tablet Take 1,000 Units by mouth daily. Taking 3,047m daily  . hydrochlorothiazide (HYDRODIURIL) 25 MG tablet TAKE 1 TABLET BY MOUTH  DAILY  . levothyroxine  (SYNTHROID) 50 MCG tablet TAKE 1 TABLET BY MOUTH  DAILY  . potassium chloride (KLOR-CON) 10 MEQ tablet TAKE 1 TABLET BY MOUTH  TWICE DAILY  . sertraline (ZOLOFT) 50 MG tablet TAKE 1 TABLET BY MOUTH  DAILY   No facility-administered encounter medications on file as of 03/06/2020.    Review of Systems  Constitutional: Negative for appetite change and unexpected weight change.  HENT: Negative for congestion, sinus pressure and sore throat.   Eyes: Negative for pain and visual disturbance.  Respiratory: Negative for cough, chest tightness and shortness of breath.   Cardiovascular: Negative for chest pain, palpitations and leg swelling.  Gastrointestinal: Negative for abdominal pain, diarrhea, nausea and vomiting.  Genitourinary: Negative for difficulty urinating and dysuria.  Musculoskeletal: Negative for joint swelling and myalgias.       Back and leg - improved.   Skin: Negative for color change and rash.  Neurological: Negative for dizziness, light-headedness and headaches.  Hematological: Negative for adenopathy. Does not bruise/bleed easily.  Psychiatric/Behavioral: Negative for agitation and dysphoric mood.       Objective:    Physical Exam Vitals reviewed.  Constitutional:      General: She is not in acute distress.    Appearance: Normal appearance. She is well-developed and well-nourished.  HENT:     Head: Normocephalic and atraumatic.     Right Ear: External ear normal.     Left Ear: External ear normal.     Mouth/Throat:     Mouth: Oropharynx is clear and moist.  Eyes:     General: No scleral icterus.       Right eye: No discharge.        Left eye: No discharge.     Conjunctiva/sclera: Conjunctivae normal.  Neck:     Thyroid: No thyromegaly.  Cardiovascular:     Rate and Rhythm: Normal rate and regular rhythm.  Pulmonary:     Effort: No tachypnea, accessory muscle usage or respiratory distress.     Breath sounds: Normal breath sounds. No decreased breath sounds or  wheezing.  Chest:  Breasts:     Right: No inverted nipple, mass, nipple discharge or tenderness (no axillary adenopathy).     Left: No inverted nipple, mass, nipple discharge or tenderness (no axilarry adenopathy).    Abdominal:     General: Bowel sounds are normal.     Palpations: Abdomen is soft.     Tenderness: There is no abdominal tenderness.  Musculoskeletal:        General: No swelling, tenderness or edema.     Cervical back: Neck supple. No tenderness.  Lymphadenopathy:  Cervical: No cervical adenopathy.  Skin:    Findings: No erythema or rash.  Neurological:     Mental Status: She is alert and oriented to person, place, and time.  Psychiatric:        Mood and Affect: Mood and affect and mood normal.        Behavior: Behavior normal.     BP 130/82   Pulse 87   Temp 97.6 F (36.4 C)   Ht 5' 3"  (1.6 m)   Wt 230 lb (104.3 kg)   SpO2 98%   BMI 40.74 kg/m  Wt Readings from Last 3 Encounters:  03/06/20 230 lb (104.3 kg)  11/02/19 222 lb (100.7 kg)  08/18/19 225 lb 9.6 oz (102.3 kg)     Lab Results  Component Value Date   WBC 8.5 10/21/2019   HGB 14.9 10/21/2019   HCT 43.1 10/21/2019   PLT 212.0 10/21/2019   GLUCOSE 108 (H) 03/02/2020   CHOL 177 03/02/2020   TRIG 159.0 (H) 03/02/2020   HDL 43.70 03/02/2020   LDLDIRECT 97.0 11/01/2018   LDLCALC 102 (H) 03/02/2020   ALT 14 03/02/2020   AST 13 03/02/2020   NA 141 03/02/2020   K 4.4 03/02/2020   CL 104 03/02/2020   CREATININE 0.81 03/02/2020   BUN 15 03/02/2020   CO2 30 03/02/2020   TSH 3.36 10/21/2019   HGBA1C 6.0 03/02/2020   MICROALBUR <0.7 06/11/2015       Assessment & Plan:   Problem List Items Addressed This Visit    Anxiety    Increased stress. Discussed.  Has good support.  Continue zoloft.  Stable.  Follow.        Atrial fibrillation ([place])    On eliquis.  Rated controlled.  Doing well.  Follow.        Healthcare maintenance    Physical today 03/06/20.  Mammogram - ordered.   Colonoscopy 03/11/19. Schedule bone density.       Hyperbilirubinemia    Slightly increased bilirubin on recent lab.  Remainder of liver panel wnl.  Recheck liver panel to confirm stable/normal.        Hyperglycemia    Low carb diet and exercise.  Follow met b and a1c.       Hyperlipidemia    On lipitor.  Low cholesterol diet and exercise.  Follow lipid panel and liver function tests.        Hypertension    Blood pressure on recheck improved.  On hctz.  Follow pressures.  Follow metabolic panel.       Hypothyroidism    On thyroid replacement.  Follow tsh.        Other Visit Diagnoses    Estrogen deficiency    -  Primary   Relevant Orders   DG Bone Density   Encounter for screening mammogram for malignant neoplasm of breast       Relevant Orders   MM 3D SCREEN BREAST BILATERAL       Einar Pheasant, MD

## 2020-03-06 NOTE — Assessment & Plan Note (Signed)
Physical today 03/06/20.  Mammogram - ordered.  Colonoscopy 03/11/19. Schedule bone density.

## 2020-03-07 ENCOUNTER — Telehealth: Payer: Self-pay

## 2020-03-07 NOTE — Telephone Encounter (Signed)
Lm with lab appt..  If appt doesn't work call office to reschedule

## 2020-03-07 NOTE — Telephone Encounter (Signed)
Does she need labs checked in a couple of weeks

## 2020-03-07 NOTE — Telephone Encounter (Signed)
Order placed for liver panel.  Please schedule Bonnie Shaw for non fasting lab appt in 3-4 weeks.  Thanks

## 2020-03-07 NOTE — Telephone Encounter (Signed)
Pt said Dr. Nicki Reaper discussed at her appt that her bilirubin was slightly elevated and she need to have it rechecked in a couple of weeks. I let her know that Dr. Bary Leriche check out note mentioned that she wanted labs before her appointment in 4 months. She asked if I could send a message back for her to get this checked in a couple of weeks?

## 2020-03-11 ENCOUNTER — Encounter: Payer: Self-pay | Admitting: Internal Medicine

## 2020-03-11 NOTE — Assessment & Plan Note (Signed)
Increased stress. Discussed.  Has good support.  Continue zoloft.  Stable.  Follow.

## 2020-03-11 NOTE — Assessment & Plan Note (Signed)
On thyroid replacement.  Follow tsh.  

## 2020-03-11 NOTE — Assessment & Plan Note (Signed)
Low carb diet and exercise.  Follow met b and a1c.  

## 2020-03-11 NOTE — Assessment & Plan Note (Signed)
On eliquis.  Rated controlled.  Doing well.  Follow.   

## 2020-03-11 NOTE — Assessment & Plan Note (Signed)
Slightly increased bilirubin on recent lab.  Remainder of liver panel wnl.  Recheck liver panel to confirm stable/normal.

## 2020-03-11 NOTE — Assessment & Plan Note (Signed)
On lipitor.  Low cholesterol diet and exercise.  Follow lipid panel and liver function tests.   

## 2020-03-11 NOTE — Assessment & Plan Note (Signed)
Blood pressure on recheck improved.  On hctz.  Follow pressures.  Follow metabolic panel.

## 2020-03-28 ENCOUNTER — Other Ambulatory Visit: Payer: Self-pay

## 2020-03-28 ENCOUNTER — Ambulatory Visit
Admission: RE | Admit: 2020-03-28 | Discharge: 2020-03-28 | Disposition: A | Discharge status: Home / Self Care | Payer: Medicare Other | Source: Ambulatory Visit | Attending: Internal Medicine | Admitting: Internal Medicine

## 2020-03-28 DIAGNOSIS — E2839 Other primary ovarian failure: Secondary | ICD-10-CM

## 2020-03-28 DIAGNOSIS — Z1231 Encounter for screening mammogram for malignant neoplasm of breast: Secondary | ICD-10-CM | POA: Insufficient documentation

## 2020-04-03 ENCOUNTER — Other Ambulatory Visit: Payer: Medicare Other

## 2020-04-05 ENCOUNTER — Other Ambulatory Visit: Payer: Self-pay

## 2020-04-05 ENCOUNTER — Other Ambulatory Visit (INDEPENDENT_AMBULATORY_CARE_PROVIDER_SITE_OTHER): Payer: Medicare Other

## 2020-04-05 LAB — HEPATIC FUNCTION PANEL
ALT: 14 U/L (ref 0–35)
AST: 13 U/L (ref 0–37)
Albumin: 4 g/dL (ref 3.5–5.2)
Alkaline Phosphatase: 62 U/L (ref 39–117)
Bilirubin, Direct: 0.2 mg/dL (ref 0.0–0.3)
Total Bilirubin: 1.1 mg/dL (ref 0.2–1.2)
Total Protein: 6.4 g/dL (ref 6.0–8.3)

## 2020-04-23 ENCOUNTER — Encounter: Payer: Self-pay | Admitting: Internal Medicine

## 2020-04-23 NOTE — Telephone Encounter (Signed)
ERROR

## 2020-05-01 ENCOUNTER — Ambulatory Visit (INDEPENDENT_AMBULATORY_CARE_PROVIDER_SITE_OTHER): Payer: Medicare Other

## 2020-05-01 VITALS — Ht 63.0 in | Wt 230.0 lb

## 2020-05-01 DIAGNOSIS — Z0000 Encounter for general adult medical examination without abnormal findings: Secondary | ICD-10-CM

## 2020-05-01 NOTE — Patient Instructions (Addendum)
Bonnie Shaw , Thank you for taking time to come for your Medicare Wellness Visit. I appreciate your ongoing commitment to your health goals. Please review the following plan we discussed and let me know if I can assist you in the future.   These are the goals we discussed: Goals    . Follow up with Primary Care Provider     As needed       This is a list of the screening recommended for you and due dates:  Health Maintenance  Topic Date Due  . Tetanus Vaccine  Never done  . COVID-19 Vaccine (4 - Booster for Pfizer series) 05/30/2020  . Colon Cancer Screening  03/10/2022  . Mammogram  03/28/2022  . Flu Shot  Completed  . DEXA scan (bone density measurement)  Completed  .  Hepatitis C: One time screening is recommended by Center for Disease Control  (CDC) for  adults born from [birth year] through 1966.   Completed  . Pneumonia vaccines  Completed  . HPV Vaccine  Aged Out    Immunizations Immunization History  Administered Date(s) Administered  . Fluad Quad(high Dose 65+) 11/03/2018, 11/02/2019  . Influenza Split 11/11/2011  . Influenza, High Dose Seasonal PF 11/09/2013, 12/18/2014, 02/10/2017, 10/08/2017  . Influenza,inj,Quad PF,6+ Mos 11/09/2013, 12/18/2014  . Influenza-Unspecified 12/12/2015  . PFIZER(Purple Top)SARS-COV-2 Vaccination 02/25/2019, 03/18/2019, 11/30/2019  . Pneumococcal Conjugate-13 12/18/2014  . Pneumococcal Polysaccharide-23 12/19/2015   Keep all routine maintenance appointments.   Next scheduled lab 06/29/20 @ 8:30  Follow up 07/04/20 @ 10:00  Advanced directives: End of life planning; Advance aging; Advanced directives discussed.  Copy of current HCPOA/Living Will requested.    Conditions/risks identified: none new.   Follow up in one year for your annual wellness visit.   Preventive Care 33 Years and Older, Female Preventive care refers to lifestyle choices and visits with your health care provider that can promote health and wellness. What does  preventive care include?  A yearly physical exam. This is also called an annual well check.  Dental exams once or twice a year.  Routine eye exams. Ask your health care provider how often you should have your eyes checked.  Personal lifestyle choices, including:  Daily care of your teeth and gums.  Regular physical activity.  Eating a healthy diet.  Avoiding tobacco and drug use.  Limiting alcohol use.  Practicing safe sex.  Taking low-dose aspirin every day.  Taking vitamin and mineral supplements as recommended by your health care provider. What happens during an annual well check? The services and screenings done by your health care provider during your annual well check will depend on your age, overall health, lifestyle risk factors, and family history of disease. Counseling  Your health care provider may ask you questions about your:  Alcohol use.  Tobacco use.  Drug use.  Emotional well-being.  Home and relationship well-being.  Sexual activity.  Eating habits.  History of falls.  Memory and ability to understand (cognition).  Work and work Statistician.  Reproductive health. Screening  You may have the following tests or measurements:  Height, weight, and BMI.  Blood pressure.  Lipid and cholesterol levels. These may be checked every 5 years, or more frequently if you are over 25 years old.  Skin check.  Lung cancer screening. You may have this screening every year starting at age 59 if you have a 30-pack-year history of smoking and currently smoke or have quit within the past 15 years.  Fecal occult  blood test (FOBT) of the stool. You may have this test every year starting at age 69.  Flexible sigmoidoscopy or colonoscopy. You may have a sigmoidoscopy every 5 years or a colonoscopy every 10 years starting at age 78.  Hepatitis C blood test.  Hepatitis B blood test.  Sexually transmitted disease (STD) testing.  Diabetes screening. This  is done by checking your blood sugar (glucose) after you have not eaten for a while (fasting). You may have this done every 1-3 years.  Bone density scan. This is done to screen for osteoporosis. You may have this done starting at age 69.  Mammogram. This may be done every 1-2 years. Talk to your health care provider about how often you should have regular mammograms. Talk with your health care provider about your test results, treatment options, and if necessary, the need for more tests. Vaccines  Your health care provider may recommend certain vaccines, such as:  Influenza vaccine. This is recommended every year.  Tetanus, diphtheria, and acellular pertussis (Tdap, Td) vaccine. You may need a Td booster every 10 years.  Zoster vaccine. You may need this after age 43.  Pneumococcal 13-valent conjugate (PCV13) vaccine. One dose is recommended after age 21.  Pneumococcal polysaccharide (PPSV23) vaccine. One dose is recommended after age 69. Talk to your health care provider about which screenings and vaccines you need and how often you need them. This information is not intended to replace advice given to you by your health care provider. Make sure you discuss any questions you have with your health care provider. Document Released: 02/16/2015 Document Revised: 10/10/2015 Document Reviewed: 11/21/2014 Elsevier Interactive Patient Education  2017 [place] Prevention in the Home Falls can cause injuries. They can happen to people of all ages. There are many things you can do to make your home safe and to help prevent falls. What can I do on the outside of my home?  Regularly fix the edges of walkways and driveways and fix any cracks.  Remove anything that might make you trip as you walk through a door, such as a raised step or threshold.  Trim any bushes or trees on the path to your home.  Use bright outdoor lighting.  Clear any walking paths of anything that might make  someone trip, such as rocks or tools.  Regularly check to see if handrails are loose or broken. Make sure that both sides of any steps have handrails.  Any raised decks and porches should have guardrails on the edges.  Have any leaves, snow, or ice cleared regularly.  Use sand or salt on walking paths during winter.  Clean up any spills in your garage right away. This includes oil or grease spills. What can I do in the bathroom?  Use night lights.  Install grab bars by the toilet and in the tub and shower. Do not use towel bars as grab bars.  Use non-skid mats or decals in the tub or shower.  If you need to sit down in the shower, use a plastic, non-slip stool.  Keep the floor dry. Clean up any water that spills on the floor as soon as it happens.  Remove soap buildup in the tub or shower regularly.  Attach bath mats securely with double-sided non-slip rug tape.  Do not have throw rugs and other things on the floor that can make you trip. What can I do in the bedroom?  Use night lights.  Make sure that you have  a light by your bed that is easy to reach.  Do not use any sheets or blankets that are too big for your bed. They should not hang down onto the floor.  Have a firm chair that has side arms. You can use this for support while you get dressed.  Do not have throw rugs and other things on the floor that can make you trip. What can I do in the kitchen?  Clean up any spills right away.  Avoid walking on wet floors.  Keep items that you use a lot in easy-to-reach places.  If you need to reach something above you, use a strong step stool that has a grab bar.  Keep electrical cords out of the way.  Do not use floor polish or wax that makes floors slippery. If you must use wax, use non-skid floor wax.  Do not have throw rugs and other things on the floor that can make you trip. What can I do with my stairs?  Do not leave any items on the stairs.  Make sure that  there are handrails on both sides of the stairs and use them. Fix handrails that are broken or loose. Make sure that handrails are as long as the stairways.  Check any carpeting to make sure that it is firmly attached to the stairs. Fix any carpet that is loose or worn.  Avoid having throw rugs at the top or bottom of the stairs. If you do have throw rugs, attach them to the floor with carpet tape.  Make sure that you have a light switch at the top of the stairs and the bottom of the stairs. If you do not have them, ask someone to add them for you. What else can I do to help prevent falls?  Wear shoes that:  Do not have high heels.  Have rubber bottoms.  Are comfortable and fit you well.  Are closed at the toe. Do not wear sandals.  If you use a stepladder:  Make sure that it is fully opened. Do not climb a closed stepladder.  Make sure that both sides of the stepladder are locked into place.  Ask someone to hold it for you, if possible.  Clearly mark and make sure that you can see:  Any grab bars or handrails.  First and last steps.  Where the edge of each step is.  Use tools that help you move around (mobility aids) if they are needed. These include:  Canes.  Walkers.  Scooters.  Crutches.  Turn on the lights when you go into a dark area. Replace any light bulbs as soon as they burn out.  Set up your furniture so you have a clear path. Avoid moving your furniture around.  If any of your floors are uneven, fix them.  If there are any pets around you, be aware of where they are.  Review your medicines with your doctor. Some medicines can make you feel dizzy. This can increase your chance of falling. Ask your doctor what other things that you can do to help prevent falls. This information is not intended to replace advice given to you by your health care provider. Make sure you discuss any questions you have with your health care provider. Document Released:  11/16/2008 Document Revised: 06/28/2015 Document Reviewed: 02/24/2014 Elsevier Interactive Patient Education  2017 [employer].

## 2020-05-01 NOTE — Progress Notes (Signed)
Subjective:   Bonnie Shaw is a 70 y.o. female who presents for an Initial Medicare Annual Wellness Visit.  Review of Systems    No ROS.  Medicare Wellness Virtual Visit.   Cardiac Risk Factors include: advanced age (>70men, >40 women);hypertension     Objective:    Today's Vitals   05/01/20 1406  Weight: 230 lb (104.3 kg)  Height: 5\' 3"  (1.6 m)   Body mass index is 40.74 kg/m.  Advanced Directives 05/01/2020 03/11/2019 09/27/2017 09/11/2017 07/09/2016 07/09/2016 07/09/2016  Does Patient Have a Medical Advance Directive? Yes Yes Yes Yes - - No  Type of Advance Directive - [place];Living will [place];Living will [place];Living will - - -  Does patient want to make changes to medical advance directive? - No - Patient declined No - Patient declined - - - -  Copy of [place] in Chart? - No - copy requested - - - - -  Would patient like information on creating a medical advance directive? - - - - No - Patient declined No - Patient declined Yes (Inpatient - patient requests chaplain consult to create a medical advance directive);No - Patient declined    Current Medications (verified) Outpatient Encounter Medications as of 05/01/2020  Medication Sig  . ALPRAZolam (XANAX) 0.25 MG tablet Take one tablet as directed prior to procedure.  Marland Kitchen apixaban (ELIQUIS) 5 MG TABS tablet Take 1 tablet (5 mg total) by mouth 2 (two) times daily.  Marland Kitchen atorvastatin (LIPITOR) 20 MG tablet TAKE 1 TABLET BY MOUTH  DAILY  . cholecalciferol (VITAMIN D3) 25 MCG (1000 UT) tablet Take 1,000 Units by mouth daily. Taking 3,000mg  daily  . hydrochlorothiazide (HYDRODIURIL) 25 MG tablet TAKE 1 TABLET BY MOUTH  DAILY  . levothyroxine (SYNTHROID) 50 MCG tablet TAKE 1 TABLET BY MOUTH  DAILY  . potassium chloride (KLOR-CON) 10 MEQ tablet TAKE 1 TABLET BY MOUTH  TWICE DAILY  . sertraline (ZOLOFT) 50 MG tablet TAKE 1 TABLET BY MOUTH  DAILY   No  facility-administered encounter medications on file as of 05/01/2020.    Allergies (verified) Ativan [lorazepam], Meloxicam, and Sulfonamide derivatives   History: Past Medical History:  Diagnosis Date  . Anxiety   . Arthritis   . Cataract   . Complication of anesthesia    Woke up during colonoscopy and during Knee surgery. Also says "nitrous makes me crazy"  . Family history of adverse reaction to anesthesia    son woke up during TEE  . 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 Hyperlipidemia   . Hypertension   . Hyperthyroidism    s/p RAI therapy  . Hypothyroidism    s/p ablation  . Left atrial enlargement    severely enlarged  . Migraine headache    2x/yr  . Motion sickness    roller coasters  . Obesity   . Panic disorder   . Persistent atrial fibrillation ([place])   . Snoring    Past Surgical History:  Procedure Laterality Date  . ABDOMINAL HYSTERECTOMY    . CARDIOVERSION N/A 09/11/2017   Procedure: CARDIOVERSION;  Surgeon: Skeet Latch, MD;  Location: [facility];  Service: Cardiovascular;  Laterality: N/A;  . CATARACT EXTRACTION    . COLONOSCOPY WITH PROPOFOL N/A 03/11/2019   Procedure: COLONOSCOPY WITH PROPOFOL;  Surgeon: Jonathon Bellows, MD;  Location: [facility];  Service: Endoscopy;  Laterality: N/A;  colon  . KNEE SURGERY Bilateral    replacements  .  POLYPECTOMY  03/11/2019   Procedure: POLYPECTOMY;  Surgeon: Jonathon Bellows, MD;  Location: [facility];  Service: Endoscopy;;  . THYROID ABLATION  2010   Family History  Problem Relation Age of Onset  . Alzheimer's disease Father   . Stroke Mother   . Cancer Sister        ovarian   . Ovarian cancer Sister 53  . Breast cancer Neg Hx    Social History   Socioeconomic History  . Marital status: Married    Spouse name: Not on file  . Number of children: 4  . Years of education: Not on file  . Highest education level: Not on file  Occupational History    Employer: UNEMPLOYED  Tobacco Use  .  Smoking status: Never Smoker  . Smokeless tobacco: Never Used  Vaping Use  . Vaping Use: Never used  Substance and Sexual Activity  . Alcohol use: No  . Drug use: No  . Sexual activity: Not on file  Other Topics Concern  . 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   Social Determinants of Radio broadcast assistant Strain: [place]   . Difficulty of Paying Living Expenses: Not hard at all  Food Insecurity: No Food Insecurity  . Worried About Charity fundraiser in the Last Year: Never true  . Ran Out of Food in the Last Year: Never true  Transportation Needs: No Transportation Needs  . Lack of Transportation (Medical): No  . Lack of Transportation (Non-Medical): No  Physical Activity: Not on file  Stress: No Stress Concern Present  . Feeling of Stress : Not at all  Social Connections: Unknown  . Frequency of Communication with Friends and Family: Not on file  . Frequency of Social Gatherings with Friends and Family: Not on file  . Attends Religious Services: Not on file  . Active Member of Clubs or Organizations: Not on file  . Attends Archivist Meetings: Not on file  . Marital Status: Married    Tobacco Counseling Counseling given: Not Answered   Clinical Intake:  Pre-visit preparation completed: Yes        Diabetes: No  How often do you need to have someone help you when you read instructions, pamphlets, or other written materials from your doctor or pharmacy?: 1 - Never   Interpreter Needed?: No      Activities of Daily Living In your present state of health, do you have any difficulty performing the following activities: 05/01/2020  Hearing? N  Vision? N  Difficulty concentrating or making decisions? N  Walking or climbing stairs? N  Dressing or bathing?  N  Doing errands, shopping? N  Preparing Food and eating ? N  Using the Toilet? N  In the past six months, have you accidently leaked urine? N  Comment Managed  Do you have problems with loss of bowel control? N  Managing your Medications? N  Managing your Finances? N  Housekeeping or managing your Housekeeping? N  Some recent data might be hidden    Patient Care Team: Einar Pheasant, MD as PCP - General (Internal Medicine) Thompson Grayer, MD as PCP - Electrophysiology (Cardiology) Thompson Grayer, MD as PCP - Cardiology (Cardiology)  Indicate any  recent Medical Services you may have received from other than [facility] providers in the past year (date may be approximate).     Assessment:   This is a routine wellness examination for Bonnie Shaw.  I connected with Bonnie Shaw today by telephone and verified that I am speaking with the correct person using two identifiers. Location patient: home Location provider: work Persons participating in the virtual visit: patient, Marine scientist.    I discussed the limitations, risks, security and privacy concerns of performing an evaluation and management service by telephone and the availability of in person appointments. The patient expressed understanding and verbally consented to this telephonic visit.    Interactive audio and video telecommunications were attempted between this provider and patient, however failed, due to patient having technical difficulties OR patient did not have access to video capability.  We continued and completed visit with audio only.  Some vital signs may be absent or patient reported.   Hearing/Vision screen  Hearing Screening   125Hz  250Hz  500Hz  1000Hz  2000Hz  3000Hz  4000Hz  6000Hz  8000Hz   Right ear:           Left ear:           Comments: Patient is able to hear conversational tones without difficulty.  No issues reported.  Vision Screening Comments: Wears corrective lenses Cataract extraction, bilateral Visual acuity not assessed,  virtual visit.  They have seen their ophthalmologist in the last 12 months.    Dietary issues and exercise activities discussed: Current Exercise Habits: Home exercise routine, Time (Minutes): 30, Frequency (Times/Week): 3, Weekly Exercise (Minutes/Week): 90, Intensity: Mild    Goals    . Follow up with Primary Care Provider     As needed      Depression Screen PHQ 2/9 Scores 05/01/2020 03/06/2020 04/23/2017 04/23/2017 12/19/2015 08/13/2015  PHQ - 2 Score 0 0 0 0 0 0  PHQ- 9 Score - - 1 - - -    Fall Risk Fall Risk  05/01/2020 03/06/2020 02/15/2019 12/19/2015 08/13/2015  Falls in the past year? 0 0 0 No No  Number falls in past yr: 0 - - - -  Injury with Fall? 0 - - - -  Follow up Falls evaluation completed Falls evaluation completed Falls evaluation completed - -    FALL RISK PREVENTION PERTAINING TO THE HOME: Handrails in use when climbing stairs? Yes Home free of loose throw rugs in walkways, pet beds, electrical cords, etc? Yes  Adequate lighting in your home to reduce risk of falls? Yes   ASSISTIVE DEVICES UTILIZED TO PREVENT FALLS: Use of a cane, walker or w/c? No   TIMED UP AND GO: Was the test performed? No . Virtual visit.   Cognitive Function:  Patient is alert and oriented x3.  Denies difficulty focusing, making decisions, memory loss.  MMSE/6CIT deferred. Normal by direct communication/observation.      Immunizations Immunization History  Administered Date(s) Administered  . Fluad Quad(high Dose 65+) 11/03/2018, 11/02/2019  . Influenza Split 11/11/2011  . Influenza, High Dose Seasonal PF 11/09/2013, 12/18/2014, 02/10/2017, 10/08/2017  . Influenza,inj,Quad PF,6+ Mos 11/09/2013, 12/18/2014  . Influenza-Unspecified 12/12/2015  . PFIZER(Purple Top)SARS-COV-2 Vaccination 02/25/2019, 03/18/2019, 11/30/2019  . Pneumococcal Conjugate-13 12/18/2014  . Pneumococcal Polysaccharide-23 12/19/2015   TDAP status: Due, Education has been provided regarding the importance of  this vaccine. Advised may receive this vaccine at local pharmacy or Health Dept. Aware to provide a copy of the vaccination record if obtained from local pharmacy or Health Dept. Verbalized acceptance and understanding. Deferred.  Health Maintenance Health Maintenance  Topic Date Due  . TETANUS/TDAP  Never done  . COVID-19 Vaccine (4 - Booster for Pfizer series) 05/30/2020  . COLONOSCOPY (Pts 45-19yrs Insurance coverage will need to be confirmed)  03/10/2022  . MAMMOGRAM  03/28/2022  . INFLUENZA VACCINE  Completed  . DEXA SCAN  Completed  . Hepatitis C Screening  Completed  . PNA vac Low Risk Adult  Completed  . HPV VACCINES  Aged Out   Colorectal cancer screening: Type of screening: Colonoscopy. Completed 03/11/19. Repeat every 3 years   Mammogram status: Completed 03/28/20. Repeat every year  Lung Cancer Screening: (Low Dose CT Chest recommended if Age 24-80 years, 30 pack-year currently smoking OR have quit w/in 15years.) does not qualify.   Vision Screening: Recommended annual ophthalmology exams for early detection of glaucoma and other disorders of the eye. Is the patient up to date with their annual eye exam?  Yes   Dental Screening: Recommended annual dental exams for proper oral hygiene.  Community Resource Referral / Chronic Care Management: CRR required this visit?  No   CCM required this visit?  No      Plan:   Keep all routine maintenance appointments.   Next scheduled lab 06/29/20 @ 8:30  Follow up 07/04/20 @ 10:00  I have personally reviewed and noted the following in the patient's chart:   . Medical and social history . Use of alcohol, tobacco or illicit drugs  . Current medications and supplements . Functional ability and status . Nutritional status . Physical activity . Advanced directives . List of other physicians . Hospitalizations, surgeries, and ER visits in previous 12 months . Vitals . Screenings to include cognitive, depression, and  falls . Referrals and appointments  In addition, I have reviewed and discussed with patient certain preventive protocols, quality metrics, and best practice recommendations. A written personalized care plan for preventive services as well as general preventive health recommendations were provided to patient via mychart.     Varney Biles, LPN   [id number]

## 2020-06-28 ENCOUNTER — Telehealth: Payer: Self-pay | Admitting: *Deleted

## 2020-06-28 DIAGNOSIS — I10 Essential (primary) hypertension: Secondary | ICD-10-CM

## 2020-06-28 DIAGNOSIS — R739 Hyperglycemia, unspecified: Secondary | ICD-10-CM

## 2020-06-28 DIAGNOSIS — E785 Hyperlipidemia, unspecified: Secondary | ICD-10-CM

## 2020-06-28 NOTE — Telephone Encounter (Signed)
Please place future orders for lab appt.  

## 2020-06-29 ENCOUNTER — Other Ambulatory Visit: Payer: Self-pay

## 2020-06-29 ENCOUNTER — Other Ambulatory Visit (INDEPENDENT_AMBULATORY_CARE_PROVIDER_SITE_OTHER): Payer: Medicare Other

## 2020-06-29 DIAGNOSIS — R739 Hyperglycemia, unspecified: Secondary | ICD-10-CM | POA: Diagnosis not present

## 2020-06-29 DIAGNOSIS — I10 Essential (primary) hypertension: Secondary | ICD-10-CM

## 2020-06-29 DIAGNOSIS — E785 Hyperlipidemia, unspecified: Secondary | ICD-10-CM | POA: Diagnosis not present

## 2020-06-29 LAB — LIPID PANEL
Cholesterol: 168 mg/dL (ref 0–200)
HDL: 48.6 mg/dL
LDL Cholesterol: 89 mg/dL (ref 0–99)
NonHDL: 119.2
Total CHOL/HDL Ratio: 3
Triglycerides: 153 mg/dL — ABNORMAL HIGH (ref 0.0–149.0)
VLDL: 30.6 mg/dL (ref 0.0–40.0)

## 2020-06-29 LAB — BASIC METABOLIC PANEL WITH GFR
BUN: 15 mg/dL (ref 6–23)
CO2: 26 meq/L (ref 19–32)
Calcium: 9.2 mg/dL (ref 8.4–10.5)
Chloride: 105 meq/L (ref 96–112)
Creatinine, Ser: 0.71 mg/dL (ref 0.40–1.20)
GFR: 85.94 mL/min
Glucose, Bld: 93 mg/dL (ref 70–99)
Potassium: 3.7 meq/L (ref 3.5–5.1)
Sodium: 140 meq/L (ref 135–145)

## 2020-06-29 LAB — HEPATIC FUNCTION PANEL
ALT: 12 U/L (ref 0–35)
AST: 12 U/L (ref 0–37)
Albumin: 4.1 g/dL (ref 3.5–5.2)
Alkaline Phosphatase: 57 U/L (ref 39–117)
Bilirubin, Direct: 0.2 mg/dL (ref 0.0–0.3)
Total Bilirubin: 1.3 mg/dL — ABNORMAL HIGH (ref 0.2–1.2)
Total Protein: 6.6 g/dL (ref 6.0–8.3)

## 2020-06-29 LAB — HEMOGLOBIN A1C: Hgb A1c MFr Bld: 6.1 % (ref 4.6–6.5)

## 2020-06-29 NOTE — Telephone Encounter (Signed)
Orders placed for f/u labs.  

## 2020-07-04 ENCOUNTER — Encounter: Payer: Self-pay | Admitting: Internal Medicine

## 2020-07-04 ENCOUNTER — Ambulatory Visit (INDEPENDENT_AMBULATORY_CARE_PROVIDER_SITE_OTHER): Payer: Medicare Other | Admitting: Internal Medicine

## 2020-07-04 ENCOUNTER — Other Ambulatory Visit: Payer: Self-pay

## 2020-07-04 DIAGNOSIS — E039 Hypothyroidism, unspecified: Secondary | ICD-10-CM | POA: Diagnosis not present

## 2020-07-04 DIAGNOSIS — I4821 Permanent atrial fibrillation: Secondary | ICD-10-CM

## 2020-07-04 DIAGNOSIS — R195 Other fecal abnormalities: Secondary | ICD-10-CM | POA: Diagnosis not present

## 2020-07-04 DIAGNOSIS — E785 Hyperlipidemia, unspecified: Secondary | ICD-10-CM

## 2020-07-04 DIAGNOSIS — I10 Essential (primary) hypertension: Secondary | ICD-10-CM

## 2020-07-04 DIAGNOSIS — R739 Hyperglycemia, unspecified: Secondary | ICD-10-CM

## 2020-07-04 DIAGNOSIS — D6869 Other thrombophilia: Secondary | ICD-10-CM

## 2020-07-04 DIAGNOSIS — F419 Anxiety disorder, unspecified: Secondary | ICD-10-CM

## 2020-07-04 NOTE — Progress Notes (Signed)
Patient ID: Bonnie Shaw, female   DOB: 03/07/1950, 70 y.o.   MRN: [id number]   Subjective:    Patient ID: Bonnie Shaw, female    DOB: [birth year], 70 y.o.   MRN: [id number]  HPI This visit occurred during the SARS-CoV-2 public health emergency.  Safety protocols were in place, including screening questions prior to the visit, additional usage of staff PPE, and extensive cleaning of exam room while observing appropriate contact time as indicated for disinfecting solutions.  Patient here for a scheduled follow up.  Here to follow up regarding hypertension, afib and hypercholesterolemia.  She reports she is doing relatively well.  Increased stres.  Overall coping relatively well. On zoloft.  No chest pain or sob reported.  No increased heart rate reported.  Overall feels heart stable.  Eating.  No nausea or vomiting.  Does report persistent change in stool - loose.  No blood.  No abdominal pain.    Past Medical History:  Diagnosis Date   Anxiety    Arthritis    Cataract    Complication of anesthesia    Woke up during colonoscopy and during Knee surgery. Also says "nitrous makes me crazy"   Family history of adverse reaction to anesthesia    son woke up during TEE   Guillain-Barre ([place]) 2018   recovered   Hyperlipidemia    Hypertension    Hyperthyroidism    s/p RAI therapy   Hypothyroidism    s/p ablation   Left atrial enlargement    severely enlarged   Migraine headache    2x/yr   Motion sickness    roller coasters   Obesity    Panic disorder    Persistent atrial fibrillation ([place])    Snoring    Past Surgical History:  Procedure Laterality Date   ABDOMINAL HYSTERECTOMY     CARDIOVERSION N/A 09/11/2017   Procedure: CARDIOVERSION;  Surgeon: Skeet Latch, MD;  Location: [facility];  Service: Cardiovascular;  Laterality: N/A;   CATARACT EXTRACTION     COLONOSCOPY WITH PROPOFOL N/A 03/11/2019   Procedure: COLONOSCOPY WITH PROPOFOL;  Surgeon: Jonathon Bellows, MD;  Location:  [facility];  Service: Endoscopy;  Laterality: N/A;  colon   KNEE SURGERY Bilateral    replacements   POLYPECTOMY  03/11/2019   Procedure: POLYPECTOMY;  Surgeon: Jonathon Bellows, MD;  Location: [place] [facility];  Service: Endoscopy;;   THYROID ABLATION  2010   Family History  Problem Relation Age of Onset   Alzheimer's disease Father    Stroke Mother    Cancer Sister        ovarian    Ovarian cancer Sister 83   Breast cancer Neg Hx    Social History   Socioeconomic History   Marital status: Married    Spouse name: Not on file   Number of children: 4   Years of education: Not on file   Highest education level: Not on file  Occupational History    Employer: UNEMPLOYED  Tobacco Use   Smoking status: Never   Smokeless tobacco: Never  Vaping Use   Vaping Use: Never used  Substance and Sexual Activity   Alcohol use: No   Drug use: No   Sexual activity: Not on file  Other Topics Concern   Not on file  Social History Narrative   Lives in [place] with husband, has 4 children and 37 grandkids         Attends [employer], 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   Social Determinants of Radio broadcast assistant Strain: Low Risk    Difficulty of Paying Living Expenses: Not hard at all  Food Insecurity: No Food Insecurity   Worried About Charity fundraiser in the Last Year: Never true   Arboriculturist in the Last Year: Never true  Transportation Needs: No Transportation Needs   Lack of Transportation (Medical): No   Lack of Transportation (Non-Medical): No  Physical Activity: Not on file  Stress: No Stress Concern Present   Feeling of Stress : Not at all  Social Connections: Unknown   Frequency of Communication with Friends and Family: Not on file   Frequency of Social Gatherings with Friends and Family: Not on file   Attends Religious Services: Not on file   Active Member of Clubs or Organizations: Not on  file   Attends Archivist Meetings: Not on file   Marital Status: Married    Outpatient Encounter Medications as of 07/04/2020  Medication Sig   ALPRAZolam (XANAX) 0.25 MG tablet Take one tablet as directed prior to procedure.   apixaban (ELIQUIS) 5 MG TABS tablet Take 1 tablet (5 mg total) by mouth 2 (two) times daily.   atorvastatin (LIPITOR) 20 MG tablet TAKE 1 TABLET BY MOUTH  DAILY   cholecalciferol (VITAMIN D3) 25 MCG (1000 UT) tablet Take 1,000 Units by mouth daily. Taking 3,066m daily   hydrochlorothiazide (HYDRODIURIL) 25 MG tablet TAKE 1 TABLET BY MOUTH  DAILY   levothyroxine (SYNTHROID) 50 MCG tablet TAKE 1 TABLET BY MOUTH  DAILY   potassium chloride (KLOR-CON) 10 MEQ tablet TAKE 1 TABLET BY MOUTH  TWICE DAILY   sertraline (ZOLOFT) 50 MG tablet TAKE 1 TABLET BY MOUTH  DAILY   No facility-administered encounter medications on file as of 07/04/2020.    Review of Systems  Constitutional:  Negative for appetite change and unexpected weight change.  HENT:  Negative for congestion and sinus pressure.   Respiratory:  Negative for cough, chest tightness and shortness of breath.   Cardiovascular:  Negative for chest pain, palpitations and leg swelling.  Gastrointestinal:  Negative for abdominal pain, constipation, nausea and vomiting.       Loose stool as outlined.   Genitourinary:  Negative for difficulty urinating and dysuria.  Musculoskeletal:  Negative for joint swelling and myalgias.  Skin:  Negative for color change and rash.  Neurological:  Negative for dizziness, light-headedness and headaches.  Psychiatric/Behavioral:  Negative for agitation and dysphoric mood.        Increased stress.  Handling things relatively well.      Objective:    Physical Exam Vitals reviewed.  Constitutional:      General: She is not in acute distress.    Appearance: Normal appearance.  HENT:     Head: Normocephalic and atraumatic.     Right Ear: External ear normal.     Left  Ear: External ear normal.  Eyes:     General: No scleral icterus.       Right eye: No discharge.        Left eye: No discharge.     Conjunctiva/sclera: Conjunctivae normal.  Neck:     Thyroid: No thyromegaly.  Cardiovascular:     Rate and Rhythm: Normal rate and regular rhythm.  Pulmonary:     Effort: No respiratory distress.     Breath sounds: Normal breath sounds. No wheezing.  Abdominal:     General: Bowel sounds are normal.     Palpations: Abdomen is soft.     Tenderness: There is no abdominal tenderness.  Musculoskeletal:        General: No swelling or tenderness.     Cervical back: Neck supple. No tenderness.  Lymphadenopathy:     Cervical: No cervical adenopathy.  Skin:    Findings: No erythema or rash.  Neurological:     Mental Status: She is alert.  Psychiatric:        Mood and Affect: Mood normal.        Behavior: Behavior normal.    BP 128/76   Pulse 85   Temp 97.6 F (36.4 C)   Resp 16   Ht 5' 3"  (1.6 m)   Wt 230 lb (104.3 kg)   SpO2 98%   BMI 40.74 kg/m  Wt Readings from Last 3 Encounters:  07/04/20 230 lb (104.3 kg)  05/01/20 230 lb (104.3 kg)  03/06/20 230 lb (104.3 kg)     Lab Results  Component Value Date   WBC 8.5 10/21/2019   HGB 14.9 10/21/2019   HCT 43.1 10/21/2019   PLT 212.0 10/21/2019   GLUCOSE 93 06/29/2020   CHOL 168 06/29/2020   TRIG 153.0 (H) 06/29/2020   HDL 48.60 06/29/2020   LDLDIRECT 97.0 11/01/2018   LDLCALC 89 06/29/2020   ALT 12 06/29/2020   AST 12 06/29/2020   NA 140 06/29/2020   K 3.7 06/29/2020   CL 105 06/29/2020   CREATININE 0.71 06/29/2020   BUN 15 06/29/2020   CO2 26 06/29/2020   TSH 3.36 10/21/2019   HGBA1C 6.1 06/29/2020   MICROALBUR <0.7 06/11/2015    DG Bone Density  Result Date: 03/28/2020 EXAM: DUAL X-RAY ABSORPTIOMETRY (DXA) FOR BONE MINERAL DENSITY IMPRESSION: Your patient Ray Glacken completed a BMD test on 03/28/2020 using the [place] (software version: 14.10) manufactured by  [employer]. The following summarizes the results of our evaluation. Technologist: SCE PATIENT BIOGRAPHICAL: Name: Kjerstin, Abrigo Patient ID: [id number] Birth Date: 21-Sep-1950 Height: 62.0 in. Gender: Female Exam Date: 03/28/2020 Weight: 230.0 lbs. Indications: Advanced Age, Bilateral Knee Replacements, Caucasian, Hyperthyroid, Hysterectomy, Oophorectomy Bilateral, Postmenopausal Fractures: Treatments: Synthroid, Vitamin D DENSITOMETRY RESULTS: Site         Region     Measured Date Measured Age WHO Classification Young Adult T-score BMD         %Change vs. Previous Significant Change (*) DualFemur Neck Right 03/28/2020 70.5 Normal 0.0 1.044 g/cm2 2.1% - DualFemur Neck Right 10/16/2016 67.0 Normal -0.1 1.023 g/cm2 - - DualFemur Total Mean 03/28/2020 70.5 Normal 1.2 1.157 g/cm2 3.9% Yes DualFemur Total Mean 10/16/2016 67.0 Normal 0.8 1.114 g/cm2 - - Left Forearm Radius 33% 03/28/2020 70.5 Normal -0.2 0.860 g/cm2 -0.2% - Left Forearm Radius 33% 10/16/2016 67.0 Normal -0.2 0.862 g/cm2 - - ASSESSMENT: The BMD measured at Forearm Radius 33% is 0.860 g/cm2 with a T-score of -0.2. This patient is considered normal according to [facility]) criteria. The scan quality is good. Lumbar spine was not utilized due to advanced degenerative changes/scoliosis. Compared with prior study, there has been a significant increase in the total hip. World Health Organization [facility]) criteria for post-menopausal, Caucasian Women: Normal:                   T-score at or above -1 SD Osteopenia/low bone mass: T-score between -1 and -2.5 SD Osteoporosis:  T-score at or below -2.5 SD RECOMMENDATIONS: 1. All patients should optimize calcium and vitamin D intake. 2. Consider FDA-approved medical therapies in postmenopausal women and men aged 48 years and older, based on the following: a. A hip or vertebral(clinical or morphometric) fracture b. T-score < -2.5 at the femoral neck or spine after appropriate  evaluation to exclude secondary causes c. Low bone mass (T-score between -1.0 and -2.5 at the femoral neck or spine) and a 10-year probability of a hip fracture > 3% or a 10-year probability of a major osteoporosis-related fracture > 20% based on the [place]-adapted WHO algorithm 3. Clinician judgment and/or patient preferences may indicate treatment for people with 10-year fracture probabilities above or below these levels FOLLOW-UP: People with diagnosed cases of osteoporosis or at high risk for fracture should have regular bone mineral density tests. For patients eligible for Medicare, routine testing is allowed once every 2 years. The testing frequency can be increased to one year for patients who have rapidly progressing disease, those who are receiving or discontinuing medical therapy to restore bone mass, or have additional risk factors. I have reviewed this report, and agree with the above findings. [facility] Electronically Signed   By: Rolm Baptise M.D.   On: 03/28/2020 12:38   MM 3D SCREEN BREAST BILATERAL  Result Date: 04/01/2020 CLINICAL DATA:  Screening. EXAM: DIGITAL SCREENING BILATERAL MAMMOGRAM WITH TOMOSYNTHESIS AND CAD TECHNIQUE: Bilateral screening digital craniocaudal and mediolateral oblique mammograms were obtained. Bilateral screening digital breast tomosynthesis was performed. The images were evaluated with computer-aided detection. COMPARISON:  Previous exam(s). ACR Breast Density Category b: There are scattered areas of fibroglandular density. FINDINGS: There are no findings suspicious for malignancy. IMPRESSION: No mammographic evidence of malignancy. A result letter of this screening mammogram will be mailed directly to the patient. RECOMMENDATION: Screening mammogram in one year. (Code:SM-B-01Y) BI-RADS CATEGORY  1: Negative. Electronically Signed   By: Lovey Newcomer M.D.   On: 04/01/2020 07:09       Assessment & Plan:   Problem List Items Addressed This Visit      Acquired thrombophilia ([place])    With afib.  On eliquis.  Follow.        Anxiety    Continue zoloft.  Has good support.  Does not feel needs any further intervention at this time.  Follow.         Atrial fibrillation ([place])    Continue eliquis.  Rate controlled.  Stable.  Follow.         Hyperbilirubinemia    Recheck liver panel with next labs.         Hyperglycemia    Low carb diet and exercise.  Follow met b and a1c.        Hyperlipidemia    Continue lipitor.  Low cholesterol diet and exercise.  Follow lipid panel and liver function tests.         Hypertension    Blood pressure as outlined.  On hctz.  Follow pressures.  Follow metabolic panel.         Hypothyroidism    On thyroid replacement.  Follow tsh.         Loose stools    Persistent loose stool as outlined.  No known sick contacts.  Given persistence, check GI pathogen panel.  Probiotics/fiber.  Follow.        Relevant Orders   Gastrointestinal Panel by PCR , Stool   Severe obesity (BMI >= 40) (HCC)    Low  carb diet and exercise.  Follow.           Einar Pheasant, MD

## 2020-07-06 ENCOUNTER — Other Ambulatory Visit
Admission: RE | Admit: 2020-07-06 | Discharge: 2020-07-06 | Disposition: A | Discharge status: Home / Self Care | Payer: Medicare Other | Source: Ambulatory Visit | Attending: Internal Medicine | Admitting: Internal Medicine

## 2020-07-06 DIAGNOSIS — R195 Other fecal abnormalities: Secondary | ICD-10-CM | POA: Insufficient documentation

## 2020-07-06 DIAGNOSIS — R197 Diarrhea, unspecified: Secondary | ICD-10-CM | POA: Diagnosis not present

## 2020-07-06 LAB — GASTROINTESTINAL PANEL BY PCR, STOOL (REPLACES STOOL CULTURE)

## 2020-07-11 ENCOUNTER — Encounter: Payer: Self-pay | Admitting: Internal Medicine

## 2020-07-13 ENCOUNTER — Encounter: Payer: Self-pay | Admitting: Internal Medicine

## 2020-07-13 DIAGNOSIS — D6869 Other thrombophilia: Secondary | ICD-10-CM | POA: Insufficient documentation

## 2020-07-13 NOTE — Assessment & Plan Note (Signed)
Low carb diet and exercise.  Follow.  

## 2020-07-13 NOTE — Assessment & Plan Note (Signed)
Recheck liver panel with next labs.

## 2020-07-13 NOTE — Assessment & Plan Note (Signed)
Low carb diet and exercise.  Follow met b and a1c.  

## 2020-07-13 NOTE — Assessment & Plan Note (Signed)
Blood pressure as outlined.  On hctz.  Follow pressures.  Follow metabolic panel.   

## 2020-07-13 NOTE — Assessment & Plan Note (Signed)
Continue eliquis.  Rate controlled.  Stable.  Follow.

## 2020-07-13 NOTE — Assessment & Plan Note (Signed)
Continue lipitor.  Low cholesterol diet and exercise.  Follow lipid panel and liver function tests.   

## 2020-07-13 NOTE — Assessment & Plan Note (Signed)
On thyroid replacement.  Follow tsh.  

## 2020-07-13 NOTE — Assessment & Plan Note (Signed)
Persistent loose stool as outlined.  No known sick contacts.  Given persistence, check GI pathogen panel.  Probiotics/fiber.  Follow.

## 2020-07-13 NOTE — Assessment & Plan Note (Signed)
Continue zoloft.  Has good support.  Does not feel needs any further intervention at this time.  Follow.

## 2020-07-13 NOTE — Assessment & Plan Note (Signed)
With afib.  On eliquis.  Follow.  

## 2020-08-09 ENCOUNTER — Other Ambulatory Visit: Payer: Self-pay | Admitting: Internal Medicine

## 2020-08-10 ENCOUNTER — Telehealth: Payer: Self-pay | Admitting: Cardiovascular Disease

## 2020-08-10 NOTE — Telephone Encounter (Signed)
Left VM to schedule 

## 2020-08-10 NOTE — Telephone Encounter (Signed)
-----   Message from Malen Gauze, RN sent at 08/10/2020  7:42 AM EDT ----- Pt needs appointment scheduled to receive future refills. Thanks

## 2020-08-10 NOTE — Telephone Encounter (Signed)
Prescription refill request for Eliquis received. Indication: Atrial fib Last office visit: 08/18/19 Maximino Greenland NP Scr: 0.71 on 06/29/25 Age: 70 Weight: 102.3  Based on above findings Eliquis 5mg  twice daily is the appropriate dose.  Refill approved X 1.  Pt needs Cardiology follow up to continue refills.  Message sent to [facility] to schedule appt.

## 2020-09-07 ENCOUNTER — Other Ambulatory Visit: Payer: Self-pay

## 2020-09-07 ENCOUNTER — Encounter: Payer: Self-pay | Admitting: Internal Medicine

## 2020-09-07 ENCOUNTER — Ambulatory Visit (INDEPENDENT_AMBULATORY_CARE_PROVIDER_SITE_OTHER): Payer: Medicare Other | Admitting: Internal Medicine

## 2020-09-07 VITALS — BP 126/80 | HR 92 | Temp 95.5°F | Ht 62.99 in | Wt 231.4 lb

## 2020-09-07 DIAGNOSIS — R739 Hyperglycemia, unspecified: Secondary | ICD-10-CM

## 2020-09-07 DIAGNOSIS — F419 Anxiety disorder, unspecified: Secondary | ICD-10-CM

## 2020-09-07 DIAGNOSIS — E039 Hypothyroidism, unspecified: Secondary | ICD-10-CM

## 2020-09-07 DIAGNOSIS — R5383 Other fatigue: Secondary | ICD-10-CM | POA: Diagnosis not present

## 2020-09-07 DIAGNOSIS — I4821 Permanent atrial fibrillation: Secondary | ICD-10-CM

## 2020-09-07 DIAGNOSIS — I10 Essential (primary) hypertension: Secondary | ICD-10-CM

## 2020-09-07 DIAGNOSIS — E785 Hyperlipidemia, unspecified: Secondary | ICD-10-CM

## 2020-09-07 DIAGNOSIS — D6869 Other thrombophilia: Secondary | ICD-10-CM

## 2020-09-07 LAB — CBC WITH DIFFERENTIAL/PLATELET
Basophils Absolute: 0.1 10*3/uL (ref 0.0–0.1)
Basophils Relative: 1 % (ref 0.0–3.0)
Eosinophils Absolute: 0.1 10*3/uL (ref 0.0–0.7)
Eosinophils Relative: 1.6 % (ref 0.0–5.0)
HCT: 43.6 % (ref 36.0–46.0)
Hemoglobin: 15 g/dL (ref 12.0–15.0)
Lymphocytes Relative: 35.7 % (ref 12.0–46.0)
Lymphs Abs: 2.9 10*3/uL (ref 0.7–4.0)
MCHC: 34.5 g/dL (ref 30.0–36.0)
MCV: 93.7 fl (ref 78.0–100.0)
Monocytes Absolute: 0.7 10*3/uL (ref 0.1–1.0)
Monocytes Relative: 8.3 % (ref 3.0–12.0)
Neutro Abs: 4.3 10*3/uL (ref 1.4–7.7)
Neutrophils Relative %: 53.4 % (ref 43.0–77.0)
Platelets: 222 10*3/uL (ref 150.0–400.0)
RBC: 4.65 Mil/uL (ref 3.87–5.11)
RDW: 13.2 % (ref 11.5–15.5)
WBC: 8.1 10*3/uL (ref 4.0–10.5)

## 2020-09-07 LAB — BASIC METABOLIC PANEL
BUN: 13 mg/dL (ref 6–23)
CO2: 27 mEq/L (ref 19–32)
Calcium: 9.6 mg/dL (ref 8.4–10.5)
Chloride: 104 mEq/L (ref 96–112)
Creatinine, Ser: 0.78 mg/dL (ref 0.40–1.20)
GFR: 76.67 mL/min (ref >60.00)
Glucose, Bld: 116 mg/dL — ABNORMAL HIGH (ref 70–99)
Potassium: 4 mEq/L (ref 3.5–5.1)
Sodium: 141 mEq/L (ref 135–145)

## 2020-09-07 LAB — TSH: TSH: 2.79 u[IU]/mL (ref 0.35–5.50)

## 2020-09-07 NOTE — Progress Notes (Signed)
Patient ID: Bonnie Shaw, female   DOB: [birth year], 70 y.o.   MRN: [id number]   Subjective:    Patient ID: Bonnie Shaw, female    DOB: 1950-12-21, 70 y.o.   MRN: [id number]  HPI This visit occurred during the SARS-CoV-2 public health emergency.  Safety protocols were in place, including screening questions prior to the visit, additional usage of staff PPE, and extensive cleaning of exam room while observing appropriate contact time as indicated for disinfecting solutions.   Patient here for a scheduled follow up. Here to follow up regarding hypertension, atrial fib, increased stress and hypercholesterolemia.  Increased stress recently - family stress.  Discussed.  She has good support.  Does not feel needs any further intervention at this time.  Discussed diet and exercise.  Stays active.  No chest pain.  Breathing stable.  Does report occasional palpitations.  Has occurred a couple of times over the last couple of weeks.  Occurs intermittently.  No nausea or vomiting.  No abdominal pain.  Bowels moving.     Past Medical History:  Diagnosis Date   Anxiety    Arthritis    Cataract    Complication of anesthesia    Woke up during colonoscopy and during Knee surgery. Also says "nitrous makes me crazy"   Family history of adverse reaction to anesthesia    son woke up during TEE   Guillain-Barre ([place]) 2018   recovered   Hyperlipidemia    Hypertension    Hyperthyroidism    s/p RAI therapy   Hypothyroidism    s/p ablation   Left atrial enlargement    severely enlarged   Migraine headache    2x/yr   Motion sickness    roller coasters   Obesity    Panic disorder    Persistent atrial fibrillation ([place])    Snoring    Past Surgical History:  Procedure Laterality Date   ABDOMINAL HYSTERECTOMY     CARDIOVERSION N/A 09/11/2017   Procedure: CARDIOVERSION;  Surgeon: Skeet Latch, MD;  Location: [place];  Service: Cardiovascular;  Laterality: N/A;   CATARACT EXTRACTION      COLONOSCOPY WITH PROPOFOL N/A 03/11/2019   Procedure: COLONOSCOPY WITH PROPOFOL;  Surgeon: Jonathon Bellows, MD;  Location: [place];  Service: Endoscopy;  Laterality: N/A;  colon   KNEE SURGERY Bilateral    replacements   POLYPECTOMY  03/11/2019   Procedure: POLYPECTOMY;  Surgeon: Jonathon Bellows, MD;  Location: [place];  Service: Endoscopy;;   THYROID ABLATION  2010   Family History  Problem Relation Age of Onset   Alzheimer's disease Father    Stroke Mother    Cancer Sister        ovarian    Ovarian cancer Sister 6   Breast cancer Neg Hx    Social History   Socioeconomic History   Marital status: Married    Spouse name: Not on file   Number of children: 4   Years of education: Not on file   Highest education level: Not on file  Occupational History    Employer: UNEMPLOYED  Tobacco Use   Smoking status: Never   Smokeless tobacco: Never  Vaping Use   Vaping Use: Never used  Substance and Sexual Activity   Alcohol use: No   Drug use: No   Sexual activity: Not on file  Other Topics Concern   Not on file  Social History Narrative   Lives in [place] with husband, has 4 children and 15  grandkids         Attends [employer], 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   Social Determinants of Radio broadcast assistant Strain: Low Risk    Difficulty of Paying Living Expenses: Not hard at all  Food Insecurity: No Food Insecurity   Worried About Charity fundraiser in the Last Year: Never true   Arboriculturist in the Last Year: Never true  Transportation Needs: No Transportation Needs   Lack of Transportation (Medical): No   Lack of Transportation (Non-Medical): No  Physical Activity: Not on file  Stress: No Stress Concern Present   Feeling of Stress : Not at all  Social Connections: Unknown   Frequency of Communication with Friends and Family: Not on file   Frequency of Social Gatherings with Friends  and Family: Not on file   Attends Religious Services: Not on file   Active Member of Clubs or Organizations: Not on file   Attends Archivist Meetings: Not on file   Marital Status: Married    Review of Systems  Constitutional:  Negative for appetite change and unexpected weight change.  HENT:  Negative for congestion and sinus pressure.   Respiratory:  Negative for cough, chest tightness and shortness of breath.   Cardiovascular:  Positive for palpitations. Negative for chest pain and leg swelling.  Gastrointestinal:  Negative for abdominal pain, diarrhea, nausea and vomiting.  Genitourinary:  Negative for difficulty urinating and dysuria.  Musculoskeletal:  Negative for joint swelling and myalgias.  Skin:  Negative for color change and rash.  Neurological:  Negative for dizziness, light-headedness and headaches.  Psychiatric/Behavioral:  Negative for agitation and dysphoric mood.       Objective:    Physical Exam Vitals reviewed.  Constitutional:      General: She is not in acute distress.    Appearance: Normal appearance.  HENT:     Head: Normocephalic and atraumatic.     Right Ear: External ear normal.     Left Ear: External ear normal.  Eyes:     General: No scleral icterus.       Right eye: No discharge.        Left eye: No discharge.     Conjunctiva/sclera: Conjunctivae normal.  Neck:     Thyroid: No thyromegaly.  Cardiovascular:     Rate and Rhythm: Normal rate.     Comments: Irregular rhythm.  Pulmonary:     Effort: No respiratory distress.     Breath sounds: Normal breath sounds. No wheezing.  Abdominal:     General: Bowel sounds are normal.     Palpations: Abdomen is soft.     Tenderness: There is no abdominal tenderness.  Musculoskeletal:        General: No swelling or tenderness.     Cervical back: Neck supple. No tenderness.  Lymphadenopathy:     Cervical: No cervical adenopathy.  Skin:    Findings: No erythema or rash.  Neurological:      Mental Status: She is alert.  Psychiatric:        Mood and Affect: Mood normal.        Behavior: Behavior normal.    BP 126/80   Pulse 92   Temp (!) 95.5 F (35.3 C)   Ht 5' 2.99" (1.6 m)   Wt 231 lb 6.4 oz (105 kg)   SpO2 97%   BMI  41.00 kg/m  Wt Readings from Last 3 Encounters:  09/07/20 231 lb 6.4 oz (105 kg)  07/04/20 230 lb (104.3 kg)  05/01/20 230 lb (104.3 kg)    Outpatient Encounter Medications as of 09/07/2020  Medication Sig   ALPRAZolam (XANAX) 0.25 MG tablet Take one tablet as directed prior to procedure.   apixaban (ELIQUIS) 5 MG TABS tablet Take 1 tablet (5 mg total) by mouth 2 (two) times daily. NEED APPT WITH CARDIOLOGY FOR MORE REFILLS   atorvastatin (LIPITOR) 20 MG tablet TAKE 1 TABLET BY MOUTH  DAILY   cholecalciferol (VITAMIN D3) 25 MCG (1000 UT) tablet Take 1,000 Units by mouth daily. Taking 2,081m daily   hydrochlorothiazide (HYDRODIURIL) 25 MG tablet TAKE 1 TABLET BY MOUTH  DAILY   levothyroxine (SYNTHROID) 50 MCG tablet TAKE 1 TABLET BY MOUTH  DAILY   potassium chloride (KLOR-CON) 10 MEQ tablet TAKE 1 TABLET BY MOUTH  TWICE DAILY   sertraline (ZOLOFT) 50 MG tablet TAKE 1 TABLET BY MOUTH  DAILY   No facility-administered encounter medications on file as of 09/07/2020.     Lab Results  Component Value Date   WBC 8.1 09/07/2020   HGB 15.0 09/07/2020   HCT 43.6 09/07/2020   PLT 222.0 09/07/2020   GLUCOSE 116 (H) 09/07/2020   CHOL 168 06/29/2020   TRIG 153.0 (H) 06/29/2020   HDL 48.60 06/29/2020   LDLDIRECT 97.0 11/01/2018   LDLCALC 89 06/29/2020   ALT 12 06/29/2020   AST 12 06/29/2020   NA 141 09/07/2020   K 4.0 09/07/2020   CL 104 09/07/2020   CREATININE 0.78 09/07/2020   BUN 13 09/07/2020   CO2 27 09/07/2020   TSH 2.79 09/07/2020   HGBA1C 6.1 06/29/2020   MICROALBUR <0.7 06/11/2015       Assessment & Plan:   Problem List Items Addressed This Visit     Acquired thrombophilia ([place]    With afib.  On eliquis.  Follow.         Anxiety    Continue zoloft.  Stable.  Follow.         Atrial fibrillation ([place] [facility]    Continue eliquis.  Rate controlled on exam today.  Occasional palpitations as outlined.  EKG -  AFIB ventricular rate 87.  Had issues with bradycardia previously on toprol and flecainide.  Hold on additional medication today.  Discussed further cardiac f/u with question of need for any further w/up.  Check cbc, metabolic panel and thyroid.         Relevant Orders   EKG 12-Lead (Completed)   TSH (Completed)   Fatigue   Relevant Orders   CBC with Differential/Platelet (Completed)   TSH (Completed)   Basic metabolic panel (Completed)   Hyperglycemia    Low carb diet and exercise.  Follow met b and a1c.        Hyperlipidemia    On lipitor.  Low cholesterol diet and exercise.  Follow lipid panel and liver function tests.         Hypertension    Blood pressure as outlined.  On hctz.  Follow pressures.  Follow metabolic panel.         Hypothyroidism    On thyroid replacement.  Follow tsh.        Severe obesity (BMI >= 40) (HCC)    Continue low carb diet and exercise.  Follow.          CEinar Pheasant MD

## 2020-09-08 ENCOUNTER — Encounter: Payer: Self-pay | Admitting: Internal Medicine

## 2020-09-08 ENCOUNTER — Telehealth: Payer: Self-pay | Admitting: Internal Medicine

## 2020-09-08 MED ORDER — APIXABAN 5 MG PO TABS: 5.0000 mg | ORAL_TABLET | Freq: Two times a day (BID) | ORAL | 1 refills | Status: DC

## 2020-09-08 NOTE — Assessment & Plan Note (Signed)
On lipitor.  Low cholesterol diet and exercise.  Follow lipid panel and liver function tests.   

## 2020-09-08 NOTE — Assessment & Plan Note (Signed)
Continue eliquis.  Rate controlled on exam today.  Occasional palpitations as outlined.  EKG -  AFIB ventricular rate 87.  Had issues with bradycardia previously on toprol and flecainide.  Hold on additional medication today.  Discussed further cardiac f/u with question of need for any further w/up.  Check cbc, metabolic panel and thyroid.

## 2020-09-08 NOTE — Assessment & Plan Note (Signed)
Continue zoloft.  Stable.  Follow.   

## 2020-09-08 NOTE — Addendum Note (Signed)
Addended by: Alisa Graff on: 09/08/2020 08:50 PM   Modules accepted: Orders

## 2020-09-08 NOTE — Assessment & Plan Note (Signed)
With afib.  On eliquis.  Follow.  

## 2020-09-08 NOTE — Assessment & Plan Note (Signed)
Low carb diet and exercise.  Follow met b and a1c.  

## 2020-09-08 NOTE — Assessment & Plan Note (Signed)
Blood pressure as outlined.  On hctz.  Follow pressures.  Follow metabolic panel.   

## 2020-09-08 NOTE — Telephone Encounter (Signed)
Please call cardiology - she previously saw Roderic Palau for afib.  She needs a f/u appt for evaluation - persistent increased heart rate and palpitations.  [phone] (I think this is the phone number).

## 2020-09-08 NOTE — Assessment & Plan Note (Signed)
Continue low carb diet and exercise.  Follow.

## 2020-09-08 NOTE — Assessment & Plan Note (Signed)
On thyroid replacement.  Follow tsh.  

## 2020-09-10 ENCOUNTER — Telehealth (HOSPITAL_COMMUNITY): Payer: Self-pay | Admitting: Nurse Practitioner

## 2020-09-10 NOTE — Telephone Encounter (Signed)
Called and left message for a-fib clinic to call back and schedule appt for patient.

## 2020-09-10 NOTE — Telephone Encounter (Signed)
Spoke with receptionist at a-fib clinic. She is going to try to reach out to patient to schedule appt and advised that she would give me a call back and let me know if unable to reach.

## 2020-09-10 NOTE — Telephone Encounter (Signed)
Called and left message with spouse for patient to call back.  Pt needs appt with Roderic Palau, NP per Larena Glassman '@Dr'$ . [facility], Pt had elevated HR at their office last week.

## 2020-09-14 ENCOUNTER — Encounter (HOSPITAL_COMMUNITY): Payer: Self-pay | Admitting: Nurse Practitioner

## 2020-09-14 ENCOUNTER — Other Ambulatory Visit: Payer: Self-pay

## 2020-09-14 ENCOUNTER — Ambulatory Visit (HOSPITAL_COMMUNITY)
Admission: RE | Admit: 2020-09-14 | Discharge: 2020-09-14 | Disposition: A | Discharge status: Home / Self Care | Payer: Medicare Other | Source: Ambulatory Visit | Attending: Nurse Practitioner | Admitting: Nurse Practitioner

## 2020-09-14 VITALS — BP 144/98 | HR 97 | Ht 62.99 in | Wt 229.2 lb

## 2020-09-14 DIAGNOSIS — E669 Obesity, unspecified: Secondary | ICD-10-CM | POA: Insufficient documentation

## 2020-09-14 DIAGNOSIS — Z79899 Other long term (current) drug therapy: Secondary | ICD-10-CM | POA: Insufficient documentation

## 2020-09-14 DIAGNOSIS — I10 Essential (primary) hypertension: Secondary | ICD-10-CM | POA: Insufficient documentation

## 2020-09-14 DIAGNOSIS — Z7901 Long term (current) use of anticoagulants: Secondary | ICD-10-CM | POA: Insufficient documentation

## 2020-09-14 DIAGNOSIS — F419 Anxiety disorder, unspecified: Secondary | ICD-10-CM | POA: Insufficient documentation

## 2020-09-14 DIAGNOSIS — I4821 Permanent atrial fibrillation: Secondary | ICD-10-CM | POA: Diagnosis present

## 2020-09-14 DIAGNOSIS — D6869 Other thrombophilia: Secondary | ICD-10-CM

## 2020-09-14 DIAGNOSIS — E785 Hyperlipidemia, unspecified: Secondary | ICD-10-CM | POA: Insufficient documentation

## 2020-09-14 NOTE — Progress Notes (Signed)
Patient ID: Bonnie Shaw, female   DOB: 01/24/1951, 70 y.o.   MRN: [id number]     Primary Care Physician: Einar Pheasant, MD Referring Physician: Dr. Donnie Mesa is a 71 y.o. female with a h/o PAF that was started on flecainide 50 mg bid by Dr. Rayann Heman latter part of June 2017.  She has not had any further afib and doing well tolerating flecainide. HR is usually in 50's at home, tolerating with minimal dizziness. She has had a sleep study and was negative. Echo showed enlargement of left atrium of 50 mm. Despite this, she has done well with minimum breakthrough afib.  She returns to afib clinic 07/29/16. She reports that she had a persistent URI and then started developing neuro symptoms of ataxia and change in sensation with touch. She was admitted to [facility], could not walk the next am and was later transferred to [facility] center for possible [place] which spinal tap confirmed and pt was treat with immunoglobulin  therapy.She was sent to SNF, rehabilitated  quickly and was d/c after 5 days. She did not have any issues with afib while there.  F/u in afib clinic 02/25/17, on last visit she saw Dr. Rayann Heman in December, BB was reduced for bradycardia. Since then she has noted a few episodes of afib, longest x one hour and HR was less than one hundred. She states that she lets her anxiety get the best of her and was afraid these short breakthrough episodes may damage her heart. HR is trending in the 60's with reduction of BB.   F/u in afib clinic, 09/01/17 pt states she feels well but she is in rate controlled afib. She is unaware. Otherwise. No issues  Pt returns to clinic 09/08/17 and ekg confirms that pt is still in afib. She is tolerating well. Discussed increasing flecainide to 100 mg bid and scheduling for cardioversion and she is in agreement.   F/u after cardioversion, 8/12. Unfortunately, it only lasted one day. This time, she was aware that she returned to afib by the use of a  [place]. She really is not that symptomatic. It was discussed at visit to see Dr. Rayann Heman to see if she would be an candidate for  ablation vrs change in antiarrythmic or living in afib as she states that she feels well. I worry about her left atrium size of 56 mm to maintain SR.   F/u 8/28, in afib clinic, for ER visit. She was in church this past Sunday and developed dizziness/pre syncope.  Her son in law who is a PA checked her pulse and it was 45 and regular. She wanted to come to the ER and be evaluated. She was asymptomatic and in rate controlled afib on arrival. Labs were unremarkable. Cardiology was consulted and she was thought to be ok to go home.She however, with her anxiety, did not want to go home and was kept overnight at her insistence/AMA. She was told that she would need a monitor as an outpatient. Metoprolol was stopped. In the office, she is rate controlled but has been super anxious ever since she was told she was in afib.  No further spells.   F/u in afib clinic, 01/14/18. She has chosen to live in  afib. She is tolerating well and has no complaints. Her v rates are elevated in the clinic but at home, the v rates are in the 70's and 80's.  F/u in afib clinic, 11/18/18. She remains in  rate controlled afib and for the most part does not appreciate afib day to day. She has no issues with anticoagulation. No further syncope.   F/u in afib clinic, per Dr. Bary Leriche request, 09/14/20. She has been referred back due to intermittent brief palpitations. She feels that this is due to her struggles with anxiety and panic attacks. She can intermittently feel like she  is dropping from the top of a high roller coaster but it only feels this way for around 10 mins. She can feel increase of HR with these episodes. Her heart rates at home are controlled in the 70's without any rate control on board. She has had symptomatic bradycaria with rate control meds. For the most part, she is able to exercsie several  times a week without any symptoms.   Today, she denies symptoms of palpitations, chest pain, shortness of breath, orthopnea, PND, lower extremity edema, dizziness, presyncope, syncope, or neurologic sequela. The patient is tolerating medications without difficulties and is otherwise without complaint today.   Past Medical History:  Diagnosis Date   Anxiety    Arthritis    Cataract    Complication of anesthesia    Woke up during colonoscopy and during Knee surgery. Also says "nitrous makes me crazy"   Family history of adverse reaction to anesthesia    son woke up during TEE   Guillain-Barre ([place]) 2018   recovered   Hyperlipidemia    Hypertension    Hyperthyroidism    s/p RAI therapy   Hypothyroidism    s/p ablation   Left atrial enlargement    severely enlarged   Migraine headache    2x/yr   Motion sickness    roller coasters   Obesity    Panic disorder    Persistent atrial fibrillation ([place])    Snoring    Past Surgical History:  Procedure Laterality Date   ABDOMINAL HYSTERECTOMY     CARDIOVERSION N/A 09/11/2017   Procedure: CARDIOVERSION;  Surgeon: Skeet Latch, MD;  Location: [place];  Service: Cardiovascular;  Laterality: N/A;   CATARACT EXTRACTION     COLONOSCOPY WITH PROPOFOL N/A 03/11/2019   Procedure: COLONOSCOPY WITH PROPOFOL;  Surgeon: Jonathon Bellows, MD;  Location: [employer];  Service: Endoscopy;  Laterality: N/A;  colon   KNEE SURGERY Bilateral    replacements   POLYPECTOMY  03/11/2019   Procedure: POLYPECTOMY;  Surgeon: Jonathon Bellows, MD;  Location: [place];  Service: Endoscopy;;   THYROID ABLATION  2010    Current Outpatient Medications  Medication Sig Dispense Refill   apixaban (ELIQUIS) 5 MG TABS tablet Take 1 tablet (5 mg total) by mouth 2 (two) times daily. 180 tablet 1   atorvastatin (LIPITOR) 20 MG tablet TAKE 1 TABLET BY MOUTH  DAILY 90 tablet 3   cholecalciferol (VITAMIN D3) 25 MCG (1000 UT) tablet Take 1,000 Units by mouth  daily. Taking 2,'000mg'$  daily     hydrochlorothiazide (HYDRODIURIL) 25 MG tablet TAKE 1 TABLET BY MOUTH  DAILY 90 tablet 3   levothyroxine (SYNTHROID) 50 MCG tablet TAKE 1 TABLET BY MOUTH  DAILY 90 tablet 3   potassium chloride (KLOR-CON) 10 MEQ tablet TAKE 1 TABLET BY MOUTH  TWICE DAILY 180 tablet 3   sertraline (ZOLOFT) 50 MG tablet TAKE 1 TABLET BY MOUTH  DAILY 90 tablet 3   No current facility-administered medications for this encounter.    Allergies  Allergen Reactions   Ativan [Lorazepam] Other (See Comments)    High sensitivity, cause pt to  pass out   Meloxicam Other (See Comments)    Hallucinations   Sulfonamide Derivatives Rash    Social History   Socioeconomic History   Marital status: Married    Spouse name: Not on file   Number of children: 4   Years of education: Not on file   Highest education level: Not on file  Occupational History    Employer: UNEMPLOYED  Tobacco Use   Smoking status: Never   Smokeless tobacco: Never  Vaping Use   Vaping Use: Never used  Substance and Sexual Activity   Alcohol use: No   Drug use: No   Sexual activity: Not on file  Other Topics Concern   Not on file  Social History Narrative   Lives in [place] with husband, has 4 children and 20 grandkids         Attends [employer], 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    Writes as a Radiation protection practitioner for the [employer]   Retired Corporate treasurer   Social Determinants of Radio broadcast assistant Strain: Low Risk    Difficulty of Paying Living Expenses: Not hard at all  Food Insecurity: No Food Insecurity   Worried About Charity fundraiser in the Last Year: Never true   Arboriculturist in the Last Year: Never true  Transportation Needs: No Transportation Needs   Lack of Transportation (Medical): No   Lack of Transportation (Non-Medical): No  Physical Activity: Not on file  Stress: No Stress Concern Present   Feeling of Stress : Not at all  Social Connections: Unknown   Frequency of  Communication with Friends and Family: Not on file   Frequency of Social Gatherings with Friends and Family: Not on file   Attends Religious Services: Not on Electrical engineer or Organizations: Not on file   Attends Archivist Meetings: Not on file   Marital Status: Married  Human resources officer Violence: Not At Risk   Fear of Current or Ex-Partner: No   Emotionally Abused: No   Physically Abused: No   Sexually Abused: No    Family History  Problem Relation Age of Onset   Alzheimer's disease Father    Stroke Mother    Cancer Sister        ovarian    Ovarian cancer Sister 10   Breast cancer Neg Hx     ROS- All systems are reviewed and negative except as per the HPI above  Physical Exam: Vitals:   09/14/20 0952  BP: (!) 144/98  Pulse: 97  Weight: 104 kg  Height: 5' 2.99" (1.6 m)    GEN- The patient is well appearing, alert and oriented x 3 today.   Head- normocephalic, atraumatic Eyes-  Sclera clear, conjunctiva pink Ears- hearing intact Oropharynx- clear Neck- supple, no JVP Lymph- no cervical lymphadenopathy Lungs- Clear to ausculation bilaterally, normal work of breathing Heart- irregular rate and rhythm, no murmurs, rubs or gallops, PMI not laterally displaced GI- soft, NT, ND, + BS Extremities- no clubbing, cyanosis, or edema MS- no significant deformity or atrophy Skin- no rash or lesion Psych- euthymic mood, full affect Neuro- strength and sensation are intact  EKG- afib at 97 bpm, qrs int 66 ms, qtc 478 ms Epic records reviewed Echo -8/19-Study Conclusions   - Left ventricle: The cavity size was normal. There was mild focal   basal hypertrophy of the septum. Systolic function was normal.   The estimated ejection fraction was in the  range of 55% to 60%.   Wall motion was normal; there were no regional wall motion   abnormalities. Features are consistent with a pseudonormal left   ventricular filling pattern, with concomitant abnormal  relaxation   and increased filling pressure (grade 2 diastolic dysfunction). - Mitral valve: Calcified annulus. Mildly thickened leaflets .   There was mild regurgitation. - Left atrium: The atrium was severely dilated. Volume/bsa, S: 59.6   ml/m^2.56 mm - Tricuspid valve: There was trivial regurgitation.  Assessment and Plan: 1. Permanent  afib She appears to be well rate controlled She feels her anxiety will make her feel her heart beat at times but this in rare and intermittent  Rate controlled without any rate control on board BB was stopped in the hospital after a  near  syncopal episode in  2019  Of flecainide as well  She is clear in her decision that she does not want any rate control meds nor further attempts to restore SR She was felt not to be a candidate for ablation in .the past 2/2  size of left atrium and paucity of symptoms  Continue on eliquis 5 mg bid for CHA2DS2VASc score of 3  2. HTN Stable   3. Anxiety/panic attacks She defers  being referred to a therapist to discuss anxiety issues nor does she want to discuss additional meds to help control her anxiety at this time    F/u as needed   Butch Penny C. Hetal Proano, [facility] [address] [place], [place] [address] [phone]

## 2020-11-07 ENCOUNTER — Ambulatory Visit: Payer: Medicare Other | Admitting: Internal Medicine

## 2020-11-24 ENCOUNTER — Other Ambulatory Visit: Payer: Self-pay | Admitting: Internal Medicine

## 2021-01-11 ENCOUNTER — Other Ambulatory Visit: Payer: Self-pay | Admitting: Internal Medicine

## 2021-01-25 ENCOUNTER — Ambulatory Visit (INDEPENDENT_AMBULATORY_CARE_PROVIDER_SITE_OTHER): Payer: Medicare Other | Admitting: Internal Medicine

## 2021-01-25 ENCOUNTER — Other Ambulatory Visit: Payer: Self-pay

## 2021-01-25 DIAGNOSIS — E785 Hyperlipidemia, unspecified: Secondary | ICD-10-CM | POA: Diagnosis not present

## 2021-01-25 DIAGNOSIS — E039 Hypothyroidism, unspecified: Secondary | ICD-10-CM | POA: Diagnosis not present

## 2021-01-25 DIAGNOSIS — Z8616 Personal history of COVID-19: Secondary | ICD-10-CM

## 2021-01-25 DIAGNOSIS — F419 Anxiety disorder, unspecified: Secondary | ICD-10-CM

## 2021-01-25 DIAGNOSIS — I10 Essential (primary) hypertension: Secondary | ICD-10-CM | POA: Diagnosis not present

## 2021-01-25 DIAGNOSIS — D6869 Other thrombophilia: Secondary | ICD-10-CM

## 2021-01-25 DIAGNOSIS — R739 Hyperglycemia, unspecified: Secondary | ICD-10-CM

## 2021-01-25 DIAGNOSIS — M2550 Pain in unspecified joint: Secondary | ICD-10-CM

## 2021-01-25 NOTE — Progress Notes (Signed)
Patient ID: Bonnie Shaw, female   DOB: 04/19/1950, 70 y.o.   MRN: [id number]   Subjective:    Patient ID: Bonnie Shaw, female    DOB: Feb 02, 1951, 70 y.o.   MRN: [id number]  This visit occurred during the SARS-CoV-2 public health emergency.  Safety protocols were in place, including screening questions prior to the visit, additional usage of staff PPE, and extensive cleaning of exam room while observing appropriate contact time as indicated for disinfecting solutions.   Patient here for a scheduled follow up.    HPI Follow up regarding afib, increased stress and blood pressure.  Saw ortho 01/21/21 - f/u knee pain.  Instructed on tylenol and votaren gel.  Saw cardiology 09/14/20 - recommended continue eliquis. S/p covid a few weeks ago.  Feels she is getting back to normal.  Some decreased energy after covid.  Occasional cough.  No chest pain or sob.  Feels from a cardiac standpoint things are stable.  No nausea or vomiting reported.  No abdominal pain reported.  Increased joint pain and stiffness.  Discussed.  Is a persistent problem for her.  Affected joints vary.     Past Medical History:  Diagnosis Date   Anxiety    Arthritis    Cataract    Complication of anesthesia    Woke up during colonoscopy and during Knee surgery. Also says "nitrous makes me crazy"   Family history of adverse reaction to anesthesia    son woke up during TEE   Guillain-Barre ([place]) 2018   recovered   Hyperlipidemia    Hypertension    Hyperthyroidism    s/p RAI therapy   Hypothyroidism    s/p ablation   Left atrial enlargement    severely enlarged   Migraine headache    2x/yr   Motion sickness    roller coasters   Obesity    Panic disorder    Persistent atrial fibrillation ([place])    Snoring    Past Surgical History:  Procedure Laterality Date   ABDOMINAL HYSTERECTOMY     CARDIOVERSION N/A 09/11/2017   Procedure: CARDIOVERSION;  Surgeon: Skeet Latch, MD;  Location: [place];  Service:  Cardiovascular;  Laterality: N/A;   CATARACT EXTRACTION     COLONOSCOPY WITH PROPOFOL N/A 03/11/2019   Procedure: COLONOSCOPY WITH PROPOFOL;  Surgeon: Jonathon Bellows, MD;  Location: [place];  Service: Endoscopy;  Laterality: N/A;  colon   KNEE SURGERY Bilateral    replacements   POLYPECTOMY  03/11/2019   Procedure: POLYPECTOMY;  Surgeon: Jonathon Bellows, MD;  Location: [place];  Service: Endoscopy;;   THYROID ABLATION  2010   Family History  Problem Relation Age of Onset   Alzheimer's disease Father    Stroke Mother    Cancer Sister        ovarian    Ovarian cancer Sister 83   Breast cancer Neg Hx    Social History   Socioeconomic History   Marital status: Married    Spouse name: Not on file   Number of children: 4   Years of education: Not on file   Highest education level: Not on file  Occupational History    Employer: UNEMPLOYED  Tobacco Use   Smoking status: Never   Smokeless tobacco: Never  Vaping Use   Vaping Use: Never used  Substance and Sexual Activity   Alcohol use: No   Drug use: No   Sexual activity: Not on file  Other Topics Concern   Not  on file  Social History Narrative   Lives in [place] with husband, has 4 children and 70 grandkids         Attends [employer], 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   Social Determinants of Radio broadcast assistant Strain: Low Risk    Difficulty of Paying Living Expenses: Not hard at all  Food Insecurity: No Food Insecurity   Worried About Charity fundraiser in the Last Year: Never true   Arboriculturist in the Last Year: Never true  Transportation Needs: No Transportation Needs   Lack of Transportation (Medical): No   Lack of Transportation (Non-Medical): No  Physical Activity: Not on file  Stress: No Stress Concern Present   Feeling of Stress : Not at all  Social Connections: Unknown   Frequency of Communication with Friends and  Family: Not on file   Frequency of Social Gatherings with Friends and Family: Not on file   Attends Religious Services: Not on file   Active Member of Clubs or Organizations: Not on file   Attends Archivist Meetings: Not on file   Marital Status: Married     Review of Systems  Constitutional:  Negative for appetite change and unexpected weight change.  HENT:  Negative for congestion and sinus pressure.   Respiratory:  Negative for chest tightness and shortness of breath.        Occasional cough.   Cardiovascular:  Negative for chest pain, palpitations and leg swelling.  Gastrointestinal:  Negative for abdominal pain, diarrhea, nausea and vomiting.  Genitourinary:  Negative for difficulty urinating and dysuria.  Musculoskeletal:  Negative for myalgias.       Increased joint pain and stiffness.    Skin:  Negative for color change and rash.  Neurological:  Negative for dizziness, light-headedness and headaches.  Psychiatric/Behavioral:  Negative for agitation and dysphoric mood.       Objective:     BP 130/74    Pulse 78    Temp 98 F (36.7 C)    Resp 16    Ht 5' 3"  (1.6 m)    Wt 227 lb 6.4 oz (103.1 kg)    SpO2 97%    BMI 40.28 kg/m  Wt Readings from Last 3 Encounters:  01/25/21 227 lb 6.4 oz (103.1 kg)  09/14/20 229 lb 3.2 oz (104 kg)  09/07/20 231 lb 6.4 oz (105 kg)    Physical Exam Vitals reviewed.  Constitutional:      General: She is not in acute distress.    Appearance: Normal appearance.  HENT:     Head: Normocephalic and atraumatic.     Right Ear: External ear normal.     Left Ear: External ear normal.  Eyes:     General: No scleral icterus.       Right eye: No discharge.        Left eye: No discharge.     Conjunctiva/sclera: Conjunctivae normal.  Neck:     Thyroid: No thyromegaly.  Cardiovascular:     Rate and Rhythm: Normal rate and regular rhythm.  Pulmonary:     Effort: No respiratory distress.     Breath sounds: Normal breath sounds. No  wheezing.  Abdominal:     General: Bowel sounds are normal.     Palpations: Abdomen is soft.     Tenderness: There is no abdominal tenderness.  Musculoskeletal:  General: No swelling or tenderness.     Cervical back: Neck supple. No tenderness.  Lymphadenopathy:     Cervical: No cervical adenopathy.  Skin:    Findings: No erythema or rash.  Neurological:     Mental Status: She is alert.  Psychiatric:        Mood and Affect: Mood normal.        Behavior: Behavior normal.     Outpatient Encounter Medications as of 01/25/2021  Medication Sig   apixaban (ELIQUIS) 5 MG TABS tablet Take 1 tablet (5 mg total) by mouth 2 (two) times daily.   atorvastatin (LIPITOR) 20 MG tablet TAKE 1 TABLET BY MOUTH  DAILY   cholecalciferol (VITAMIN D3) 25 MCG (1000 UT) tablet Take 1,000 Units by mouth daily. Taking 2,062m daily   hydrochlorothiazide (HYDRODIURIL) 25 MG tablet TAKE 1 TABLET BY MOUTH  DAILY   levothyroxine (SYNTHROID) 50 MCG tablet TAKE 1 TABLET BY MOUTH  DAILY   potassium chloride (KLOR-CON) 10 MEQ tablet TAKE 1 TABLET BY MOUTH  TWICE DAILY   sertraline (ZOLOFT) 50 MG tablet TAKE 1 TABLET BY MOUTH  DAILY   No facility-administered encounter medications on file as of 01/25/2021.     Lab Results  Component Value Date   WBC 8.1 09/07/2020   HGB 15.0 09/07/2020   HCT 43.6 09/07/2020   PLT 222.0 09/07/2020   GLUCOSE 116 (H) 09/07/2020   CHOL 168 06/29/2020   TRIG 153.0 (H) 06/29/2020   HDL 48.60 06/29/2020   LDLDIRECT 97.0 11/01/2018   LDLCALC 89 06/29/2020   ALT 12 06/29/2020   AST 12 06/29/2020   NA 141 09/07/2020   K 4.0 09/07/2020   CL 104 09/07/2020   CREATININE 0.78 09/07/2020   BUN 13 09/07/2020   CO2 27 09/07/2020   TSH 2.79 09/07/2020   HGBA1C 6.1 06/29/2020   MICROALBUR <0.7 06/11/2015       Assessment & Plan:   Problem List Items Addressed This Visit     Acquired thrombophilia ([place]    With afib.  On eliquis.  Follow.       Anxiety    Continue  zoloft.  Appears to be stable.  Follow.        History of COVID-19    Doing well.  Feel getting back to normal.  Energy improved.  Notify me if cough does not continue to improve (if does not resolve).        Hyperglycemia    Low carb diet and exercise.  Follow met b and a1c.       Relevant Orders   Hemoglobin A1c   Hyperlipidemia    On lipitor.  Low cholesterol diet and exercise.  Follow lipid panel and liver function tests.        Relevant Orders   Hepatic function panel   Lipid panel   Hypertension    Blood pressure as outlined.  On hctz.  Follow pressures.  Follow metabolic panel.        Relevant Orders   Basic metabolic panel   Hypothyroidism    On thyroid replacement.  Follow tsh.       Relevant Orders   TSH   Joint ache    Joint aches.  Varying joints.  Persistent problem.  Request referral - refer to rheumatology.        Relevant Orders   Ambulatory referral to Rheumatology     CEinar Pheasant MD

## 2021-01-28 ENCOUNTER — Encounter: Payer: Self-pay | Admitting: Internal Medicine

## 2021-01-29 DIAGNOSIS — Z8616 Personal history of COVID-19: Secondary | ICD-10-CM | POA: Insufficient documentation

## 2021-01-29 DIAGNOSIS — M2550 Pain in unspecified joint: Secondary | ICD-10-CM | POA: Insufficient documentation

## 2021-01-29 NOTE — Assessment & Plan Note (Signed)
With afib.  On eliquis.  Follow.  

## 2021-01-29 NOTE — Assessment & Plan Note (Signed)
Blood pressure as outlined.  On hctz.  Follow pressures.  Follow metabolic panel.   

## 2021-01-29 NOTE — Assessment & Plan Note (Signed)
Doing well.  Feel getting back to normal.  Energy improved.  Notify me if cough does not continue to improve (if does not resolve).

## 2021-01-29 NOTE — Assessment & Plan Note (Signed)
Joint aches.  Varying joints.  Persistent problem.  Request referral - refer to rheumatology.

## 2021-01-29 NOTE — Assessment & Plan Note (Signed)
On thyroid replacement.  Follow tsh.  

## 2021-01-29 NOTE — Assessment & Plan Note (Signed)
On lipitor.  Low cholesterol diet and exercise.  Follow lipid panel and liver function tests.   

## 2021-01-29 NOTE — Assessment & Plan Note (Signed)
Low carb diet and exercise.  Follow met b and a1c.

## 2021-01-29 NOTE — Assessment & Plan Note (Signed)
Continue zoloft.  Appears to be stable.  Follow.

## 2021-02-01 ENCOUNTER — Other Ambulatory Visit: Payer: Self-pay | Admitting: Internal Medicine

## 2021-02-08 ENCOUNTER — Other Ambulatory Visit: Payer: Medicare Other

## 2021-02-12 ENCOUNTER — Other Ambulatory Visit: Payer: Self-pay | Admitting: Internal Medicine

## 2021-02-12 DIAGNOSIS — Z1231 Encounter for screening mammogram for malignant neoplasm of breast: Secondary | ICD-10-CM

## 2021-02-15 ENCOUNTER — Other Ambulatory Visit: Payer: Self-pay

## 2021-02-15 ENCOUNTER — Other Ambulatory Visit (INDEPENDENT_AMBULATORY_CARE_PROVIDER_SITE_OTHER): Payer: Medicare Other

## 2021-02-15 DIAGNOSIS — I10 Essential (primary) hypertension: Secondary | ICD-10-CM

## 2021-02-15 DIAGNOSIS — E785 Hyperlipidemia, unspecified: Secondary | ICD-10-CM | POA: Diagnosis not present

## 2021-02-15 DIAGNOSIS — R739 Hyperglycemia, unspecified: Secondary | ICD-10-CM | POA: Diagnosis not present

## 2021-02-15 DIAGNOSIS — E039 Hypothyroidism, unspecified: Secondary | ICD-10-CM

## 2021-02-15 LAB — LIPID PANEL
Cholesterol: 169 mg/dL (ref 0–200)
HDL: 43.4 mg/dL (ref >39.00)
LDL Cholesterol: 88 mg/dL (ref 0–99)
NonHDL: 125.12
Total CHOL/HDL Ratio: 4
Triglycerides: 185 mg/dL — ABNORMAL HIGH (ref 0.0–149.0)
VLDL: 37 mg/dL (ref 0.0–40.0)

## 2021-02-15 LAB — TSH: TSH: 3.22 u[IU]/mL (ref 0.35–5.50)

## 2021-02-15 LAB — BASIC METABOLIC PANEL
BUN: 16 mg/dL (ref 6–23)
CO2: 28 mEq/L (ref 19–32)
Calcium: 9.5 mg/dL (ref 8.4–10.5)
Chloride: 103 mEq/L (ref 96–112)
Creatinine, Ser: 0.79 mg/dL (ref 0.40–1.20)
GFR: 75.27 mL/min (ref >60.00)
Glucose, Bld: 108 mg/dL — ABNORMAL HIGH (ref 70–99)
Potassium: 3.9 mEq/L (ref 3.5–5.1)
Sodium: 142 mEq/L (ref 135–145)

## 2021-02-15 LAB — HEPATIC FUNCTION PANEL
ALT: 11 U/L (ref 0–35)
AST: 13 U/L (ref 0–37)
Albumin: 4.3 g/dL (ref 3.5–5.2)
Alkaline Phosphatase: 60 U/L (ref 39–117)
Bilirubin, Direct: 0.2 mg/dL (ref 0.0–0.3)
Total Bilirubin: 1.3 mg/dL — ABNORMAL HIGH (ref 0.2–1.2)
Total Protein: 6.6 g/dL (ref 6.0–8.3)

## 2021-02-15 LAB — HEMOGLOBIN A1C: Hgb A1c MFr Bld: 6.1 % (ref 4.6–6.5)

## 2021-02-18 ENCOUNTER — Other Ambulatory Visit: Payer: Self-pay | Admitting: Internal Medicine

## 2021-02-18 NOTE — Progress Notes (Signed)
Order placed for abdominal ultrasound.   

## 2021-02-27 ENCOUNTER — Other Ambulatory Visit: Payer: Self-pay

## 2021-02-27 ENCOUNTER — Ambulatory Visit
Admission: RE | Admit: 2021-02-27 | Discharge: 2021-02-27 | Disposition: A | Discharge status: Home / Self Care | Payer: Medicare Other | Source: Ambulatory Visit | Attending: Internal Medicine | Admitting: Internal Medicine

## 2021-03-01 ENCOUNTER — Encounter: Payer: Self-pay | Admitting: Internal Medicine

## 2021-03-04 NOTE — Telephone Encounter (Signed)
See result note.  

## 2021-03-05 ENCOUNTER — Other Ambulatory Visit: Payer: Self-pay | Admitting: Internal Medicine

## 2021-03-05 ENCOUNTER — Telehealth: Payer: Self-pay | Admitting: Internal Medicine

## 2021-03-05 ENCOUNTER — Telehealth: Payer: Self-pay

## 2021-03-05 DIAGNOSIS — K869 Disease of pancreas, unspecified: Secondary | ICD-10-CM

## 2021-03-05 DIAGNOSIS — R1900 Intra-abdominal and pelvic swelling, mass and lump, unspecified site: Secondary | ICD-10-CM

## 2021-03-05 DIAGNOSIS — R935 Abnormal findings on diagnostic imaging of other abdominal regions, including retroperitoneum: Secondary | ICD-10-CM

## 2021-03-05 NOTE — Progress Notes (Signed)
Order placed for MRI abdoment

## 2021-03-05 NOTE — Telephone Encounter (Signed)
LMTCB

## 2021-03-05 NOTE — Telephone Encounter (Signed)
I called ortho (Dr Theodore Demark office) just to confirm.  They are going to check with him and call back.  Hold until we receive.

## 2021-03-05 NOTE — Telephone Encounter (Signed)
Patient aware of below and would like to hold on lorazepam. Will let Rasheedah know for sure if we need to send in rx. Patient says that she has had knee replacements but has also had MRIs done since then.

## 2021-03-05 NOTE — Telephone Encounter (Signed)
Pt returning call

## 2021-03-05 NOTE — Telephone Encounter (Signed)
-----   Message from Einar Pheasant, MD sent at 03/05/2021  4:58 AM EST ----- Notify - I have placed the order for the MRI.  Have requested open MRI.  I can call in lorazepam .5mg  (take 1/2 tablet prior to procedure) if desires.  Also, need to confirm has no contraindication to MRI (no metal, etc).

## 2021-03-05 NOTE — Telephone Encounter (Signed)
Lft pt vm to call ofc . Thanks  

## 2021-03-06 NOTE — Telephone Encounter (Signed)
error 

## 2021-03-07 NOTE — Telephone Encounter (Signed)
Per Dr Nicki Reaper and Dr Rudene Christians- ok for patient to have MRI

## 2021-03-11 ENCOUNTER — Telehealth: Payer: Self-pay | Admitting: Internal Medicine

## 2021-03-11 NOTE — Telephone Encounter (Signed)
Patient advised would like to speak with Larena Glassman returning call, Please call patient @ [phone]

## 2021-03-11 NOTE — Telephone Encounter (Signed)
Patient has mri on 2/17 and has decided she would like to have something to help calm her. She has used xanax in the past and did well with It.

## 2021-03-12 MED ORDER — ALPRAZOLAM 0.25 MG PO TABS: ORAL_TABLET | ORAL | 0 refills | Status: DC

## 2021-03-12 NOTE — Telephone Encounter (Signed)
I have sent in rx for xanax.  She will take with her to her procedure.

## 2021-03-12 NOTE — Telephone Encounter (Signed)
I am ok to send in xanax, but just need to clarify a few things.  I reviewed her chart.  She has had issues with lorazepam (ativan) previously.  Just wanted to confirm she has definitely taken xanax and was able to tolerate.  If so, I will send in xanax .25mg  to take with her to have the MRI.  She will take one before the test.

## 2021-03-12 NOTE — Telephone Encounter (Signed)
LM for patient

## 2021-03-12 NOTE — Telephone Encounter (Signed)
Confirmed with patient has used xanax in past. Took before procedure in 2021- no issues.

## 2021-03-26 ENCOUNTER — Telehealth: Payer: Self-pay

## 2021-03-26 DIAGNOSIS — R160 Hepatomegaly, not elsewhere classified: Secondary | ICD-10-CM

## 2021-03-26 DIAGNOSIS — K869 Disease of pancreas, unspecified: Secondary | ICD-10-CM

## 2021-03-26 NOTE — Telephone Encounter (Signed)
LM with family member for patient to call back. Patient was out shopping.

## 2021-03-26 NOTE — Telephone Encounter (Signed)
MRI of Abd received from [place].  Impression: No concerning mass. Mild hepatomegaly. 65mm smaller pancreatic cystic lesions which are likely benign. 12 month follow up MRI to ensure stability.  Report placed in results folder

## 2021-03-26 NOTE — Telephone Encounter (Signed)
Notify Bonnie Shaw that her MRI reveals no concerning mass.  They did see what appeared to be cysts - pancreas - which per their report are likely benign.  Liver slightly enlarged.  They recommended a f/u MRI in 12 months.  Notify her that I would like to refer her to GI for further evaluation and confirm no further w/up warranted.  Can refer to GI of her choice.

## 2021-03-26 NOTE — Telephone Encounter (Signed)
Patient called back and results given and patient voiced understanding, patient prefers to stay in [facility] for GI referral and ask provider to recommend the GI doctor.

## 2021-03-27 NOTE — Telephone Encounter (Signed)
Prefers to see another GI office. Ok to refer to [place]?

## 2021-03-27 NOTE — Telephone Encounter (Signed)
Referral placed.

## 2021-03-27 NOTE — Telephone Encounter (Signed)
Ok to refer.  Pancreatic cysts and hepatomegaly.

## 2021-03-27 NOTE — Addendum Note (Signed)
Addended by: Lars Masson on: 03/27/2021 03:28 PM   Modules accepted: Orders

## 2021-03-27 NOTE — Telephone Encounter (Signed)
She has seen Dr Vicente Males.  He did her colonoscopy in 2021.  She needs a f/u scheduled with him to f/u regarding MRI findings and question of need for any further w/up or evaluation and to determine necessary f/u.  Please schedule an appt with Dr Vicente Males ([facility])

## 2021-03-29 ENCOUNTER — Ambulatory Visit
Admission: RE | Admit: 2021-03-29 | Discharge: 2021-03-29 | Disposition: A | Discharge status: Home / Self Care | Payer: Medicare Other | Source: Ambulatory Visit | Attending: Internal Medicine | Admitting: Internal Medicine

## 2021-03-29 ENCOUNTER — Other Ambulatory Visit: Payer: Self-pay

## 2021-03-29 DIAGNOSIS — Z1231 Encounter for screening mammogram for malignant neoplasm of breast: Secondary | ICD-10-CM | POA: Insufficient documentation

## 2021-04-15 ENCOUNTER — Other Ambulatory Visit: Payer: Self-pay | Admitting: Internal Medicine

## 2021-04-30 ENCOUNTER — Telehealth: Payer: Self-pay

## 2021-04-30 ENCOUNTER — Other Ambulatory Visit: Payer: Self-pay

## 2021-04-30 ENCOUNTER — Ambulatory Visit (INDEPENDENT_AMBULATORY_CARE_PROVIDER_SITE_OTHER): Payer: Medicare Other | Admitting: Internal Medicine

## 2021-04-30 ENCOUNTER — Encounter: Payer: Self-pay | Admitting: Internal Medicine

## 2021-04-30 VITALS — BP 136/80 | HR 82 | Temp 98.1°F | Ht 63.0 in | Wt 226.0 lb

## 2021-04-30 DIAGNOSIS — Z1211 Encounter for screening for malignant neoplasm of colon: Secondary | ICD-10-CM

## 2021-04-30 DIAGNOSIS — F419 Anxiety disorder, unspecified: Secondary | ICD-10-CM

## 2021-04-30 DIAGNOSIS — I10 Essential (primary) hypertension: Secondary | ICD-10-CM | POA: Diagnosis not present

## 2021-04-30 DIAGNOSIS — K869 Disease of pancreas, unspecified: Secondary | ICD-10-CM

## 2021-04-30 DIAGNOSIS — E039 Hypothyroidism, unspecified: Secondary | ICD-10-CM

## 2021-04-30 DIAGNOSIS — D6869 Other thrombophilia: Secondary | ICD-10-CM

## 2021-04-30 DIAGNOSIS — M21619 Bunion of unspecified foot: Secondary | ICD-10-CM | POA: Insufficient documentation

## 2021-04-30 DIAGNOSIS — E785 Hyperlipidemia, unspecified: Secondary | ICD-10-CM

## 2021-04-30 DIAGNOSIS — R160 Hepatomegaly, not elsewhere classified: Secondary | ICD-10-CM

## 2021-04-30 DIAGNOSIS — R739 Hyperglycemia, unspecified: Secondary | ICD-10-CM

## 2021-04-30 DIAGNOSIS — K862 Cyst of pancreas: Secondary | ICD-10-CM

## 2021-04-30 DIAGNOSIS — M2550 Pain in unspecified joint: Secondary | ICD-10-CM

## 2021-04-30 DIAGNOSIS — R5383 Other fatigue: Secondary | ICD-10-CM

## 2021-04-30 DIAGNOSIS — I4821 Permanent atrial fibrillation: Secondary | ICD-10-CM

## 2021-04-30 NOTE — Assessment & Plan Note (Signed)
On thyroid replacement.  Follow tsh.  

## 2021-04-30 NOTE — Assessment & Plan Note (Signed)
Saw rheumatology - Dr Posey Pronto.  Osteoarthritis.  Exercises.   ?

## 2021-04-30 NOTE — Assessment & Plan Note (Signed)
Low carb diet and exercise.  Follow met b and a1c.  ?

## 2021-04-30 NOTE — Assessment & Plan Note (Signed)
MRI 03/22/21 - 3mm pancreatic cystic lesion - likely benign.  Recommended f/u in one year.   

## 2021-04-30 NOTE — Assessment & Plan Note (Signed)
Colonoscopy 03/2019.  Recommended f/u in 3 years.  She request to follow up with Dr Toledo.  

## 2021-04-30 NOTE — Progress Notes (Signed)
Patient ID: Bonnie Shaw, female   DOB: 1950/11/22, 71 y.o.   MRN: [id number] ? ? ?Subjective:  ? ? Patient ID: Bonnie Shaw, female    DOB: 1950/10/14, 71 y.o.   MRN: [id number] ? ?This visit occurred during the SARS-CoV-2 public health emergency.  Safety protocols were in place, including screening questions prior to the visit, additional usage of staff PPE, and extensive cleaning of exam room while observing appropriate contact time as indicated for disinfecting solutions.  ? ?Patient here for a scheduled follow up.  ? ?Chief Complaint  ?Patient presents with  ? Follow-up  ?  3 mo f/u -HTN  ? .  ? ?HPI ?Follow up regarding afib, hypertension and increased stress.  Reports she is doing well.  Afib.  Rated controlled.  No chest pain.  Breathing stable.  No acid reflux. No abdominal pain.  Occasional soft stool.  Saw Dr Alice Reichert.  MRI - ok.  Last colonoscopy 2021.  Due f/u in 03/2022.  Saw Dr Posey Pronto - osteoarthritis.  Recommended continuing tylenol and exercises.  Taking zoloft.  Does have bunion - left foot.  Increased pain.  Request referral back to podiatry.   ? ? ?Past Medical History:  ?Diagnosis Date  ? Anxiety   ? Arthritis   ? Cataract   ? Complication of anesthesia   ? Woke up during colonoscopy and during Knee surgery. Also says "nitrous makes me crazy"  ? Family history of adverse reaction to anesthesia   ? son woke up during TEE  ? Guillain-Barre ([place]) 2018  ? recovered  ? Hyperlipidemia   ? Hypertension   ? Hyperthyroidism   ? s/p RAI therapy  ? Hypothyroidism   ? s/p ablation  ? Left atrial enlargement   ? severely enlarged  ? Migraine headache   ? 2x/yr  ? Motion sickness   ? roller coasters  ? Obesity   ? Panic disorder   ? Persistent atrial fibrillation ([place])   ? Snoring   ? ?Past Surgical History:  ?Procedure Laterality Date  ? ABDOMINAL HYSTERECTOMY    ? CARDIOVERSION N/A 09/11/2017  ? Procedure: CARDIOVERSION;  Surgeon: Skeet Latch, MD;  Location: [facility];  Service: Cardiovascular;   Laterality: N/A;  ? CATARACT EXTRACTION    ? COLONOSCOPY WITH PROPOFOL N/A 03/11/2019  ? Procedure: COLONOSCOPY WITH PROPOFOL;  Surgeon: Jonathon Bellows, MD;  Location: [facility];  Service: Endoscopy;  Laterality: N/A;  colon  ? KNEE SURGERY Bilateral   ? replacements  ? POLYPECTOMY  03/11/2019  ? Procedure: POLYPECTOMY;  Surgeon: Jonathon Bellows, MD;  Location: [facility];  Service: Endoscopy;;  ? THYROID ABLATION  2010  ? ?Family History  ?Problem Relation Age of Onset  ? Alzheimer's disease Father   ? Stroke Mother   ? Cancer Sister   ?     ovarian   ? Ovarian cancer Sister 55  ? Breast cancer Neg Hx   ? ?Social History  ? ?Socioeconomic History  ? Marital status: Married  ?  Spouse name: Not on file  ? Number of children: 4  ? Years of education: Not on file  ? Highest education level: Not on file  ?Occupational History  ?  Employer: UNEMPLOYED  ?Tobacco Use  ? Smoking status: Never  ? Smokeless tobacco: Never  ?Vaping Use  ? Vaping Use: Never used  ?Substance and Sexual Activity  ? Alcohol use: No  ? Drug use: No  ? Sexual activity: Not on file  ?Other  Topics Concern  ? Not on file  ?Social History Narrative  ? Lives in [place] with husband, has 4 children and 15 grandkids  ?   ?   ? Attends [employer], 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   ? Writes as a Radiation protection practitioner for the [employer]  ? Retired LPN  ? ?Social Determinants of Health  ? ?Financial Resource Strain: Low Risk   ? Difficulty of Paying Living Expenses: Not hard at all  ?Food Insecurity: No Food Insecurity  ? Worried About Charity fundraiser in the Last Year: Never true  ? Ran Out of Food in the Last Year: Never true  ?Transportation Needs: No Transportation Needs  ? Lack of Transportation (Medical): No  ? Lack of Transportation (Non-Medical): No  ?Physical Activity: Not on file  ?Stress: No Stress Concern Present  ? Feeling of Stress : Not at all  ?Social Connections: Unknown  ? Frequency of Communication with Friends and Family: Not on file   ? Frequency of Social Gatherings with Friends and Family: Not on file  ? Attends Religious Services: Not on file  ? Active Member of Clubs or Organizations: Not on file  ? Attends Archivist Meetings: Not on file  ? Marital Status: Married  ? ? ? ?Review of Systems  ?Constitutional:  Negative for appetite change and unexpected weight change.  ?HENT:  Negative for congestion and sinus pressure.   ?Respiratory:  Negative for cough, chest tightness and shortness of breath.   ?Cardiovascular:  Negative for chest pain, palpitations and leg swelling.  ?Gastrointestinal:  Negative for abdominal pain, diarrhea, nausea and vomiting.  ?Genitourinary:  Negative for difficulty urinating and dysuria.  ?Musculoskeletal:  Negative for joint swelling and myalgias.  ?     Bunion - left foot.    ?Skin:  Negative for color change and rash.  ?Neurological:  Negative for dizziness, light-headedness and headaches.  ?Psychiatric/Behavioral:  Negative for agitation and dysphoric mood.   ? ?   ?Objective:  ?  ? ?BP 136/80 (BP Location: Left Arm, Patient Position: Sitting, Cuff Size: Large)   Pulse 82   Temp 98.1 ?F (36.7 ?C) (Oral)   Ht '5\' 3"'$  (1.6 m)   Wt 226 lb (102.5 kg)   SpO2 98%   BMI 40.03 kg/m?  ?Wt Readings from Last 3 Encounters:  ?04/30/21 226 lb (102.5 kg)  ?01/25/21 227 lb 6.4 oz (103.1 kg)  ?09/14/20 229 lb 3.2 oz (104 kg)  ? ? ?Physical Exam ?Vitals reviewed.  ?Constitutional:   ?   General: She is not in acute distress. ?   Appearance: Normal appearance.  ?HENT:  ?   Head: Normocephalic and atraumatic.  ?   Right Ear: External ear normal.  ?   Left Ear: External ear normal.  ?Eyes:  ?   General: No scleral icterus.    ?   Right eye: No discharge.     ?   Left eye: No discharge.  ?   Conjunctiva/sclera: Conjunctivae normal.  ?Neck:  ?   Thyroid: No thyromegaly.  ?Cardiovascular:  ?   Rate and Rhythm: Normal rate.  ?   Comments: Irregularly irregular rhythm.  Rate controlled.   ?Pulmonary:  ?   Effort: No  respiratory distress.  ?   Breath sounds: Normal breath sounds. No wheezing.  ?Abdominal:  ?   General: Bowel sounds are normal.  ?   Palpations: Abdomen is soft.  ?   Tenderness: There is no abdominal tenderness.  ?  Musculoskeletal:     ?   General: No swelling or tenderness.  ?   Cervical back: Neck supple. No tenderness.  ?Lymphadenopathy:  ?   Cervical: No cervical adenopathy.  ?Skin: ?   Findings: No erythema or rash.  ?Neurological:  ?   Mental Status: She is alert.  ?Psychiatric:     ?   Mood and Affect: Mood normal.     ?   Behavior: Behavior normal.  ? ? ? ?Outpatient Encounter Medications as of 04/30/2021  ?Medication Sig  ? ALPRAZolam (XANAX) 0.25 MG tablet Take 1 tablet by mouth x 1 - prior to procedure.  (Take with you to procedure).  ? atorvastatin (LIPITOR) 20 MG tablet TAKE 1 TABLET BY MOUTH  DAILY  ? cholecalciferol (VITAMIN D3) 25 MCG (1000 UT) tablet Take 1,000 Units by mouth daily. Taking 2,'000mg'$  daily  ? ELIQUIS 5 MG TABS tablet TAKE 1 TABLET BY MOUTH  TWICE DAILY  ? hydrochlorothiazide (HYDRODIURIL) 25 MG tablet TAKE 1 TABLET BY MOUTH  DAILY  ? levothyroxine (SYNTHROID) 50 MCG tablet TAKE 1 TABLET BY MOUTH  DAILY  ? potassium chloride (KLOR-CON) 10 MEQ tablet TAKE 1 TABLET BY MOUTH  TWICE DAILY  ? sertraline (ZOLOFT) 50 MG tablet TAKE 1 TABLET BY MOUTH  DAILY  ? ?No facility-administered encounter medications on file as of 04/30/2021.  ?  ? ?Lab Results  ?Component Value Date  ? WBC 8.1 09/07/2020  ? HGB 15.0 09/07/2020  ? HCT 43.6 09/07/2020  ? PLT 222.0 09/07/2020  ? GLUCOSE 108 (H) 02/15/2021  ? CHOL 169 02/15/2021  ? TRIG 185.0 (H) 02/15/2021  ? HDL 43.40 02/15/2021  ? LDLDIRECT 97.0 11/01/2018  ? [place] 88 02/15/2021  ? ALT 11 02/15/2021  ? AST 13 02/15/2021  ? NA 142 02/15/2021  ? K 3.9 02/15/2021  ? CL 103 02/15/2021  ? CREATININE 0.79 02/15/2021  ? BUN 16 02/15/2021  ? CO2 28 02/15/2021  ? TSH 3.22 02/15/2021  ? HGBA1C 6.1 02/15/2021  ? MICROALBUR <0.7 06/11/2015  ? ? ?MM 3D SCREEN BREAST  BILATERAL ? ?Result Date: 03/29/2021 ?CLINICAL DATA:  Screening. EXAM: DIGITAL SCREENING BILATERAL MAMMOGRAM WITH TOMOSYNTHESIS AND CAD TECHNIQUE: Bilateral screening digital craniocaudal and mediolateral oblique mammogr

## 2021-04-30 NOTE — Assessment & Plan Note (Signed)
On lipitor.  Low cholesterol diet and exercise.  Follow lipid panel and liver function tests.   

## 2021-04-30 NOTE — Assessment & Plan Note (Signed)
Continue eliquis.  Rated controlled.  She is asymptomatic.  Had issues with bradycardia previously on toprol and flecainide.  No chest pain.  Breathing stable.  Follow.   

## 2021-04-30 NOTE — Assessment & Plan Note (Signed)
Continue zoloft.  Stable.  Follow.   

## 2021-04-30 NOTE — Assessment & Plan Note (Signed)
Diet and exercise.  Follow.  

## 2021-04-30 NOTE — Telephone Encounter (Signed)
Pulse ox not working  ?O2 and Temp put in from previous visit ?

## 2021-04-30 NOTE — Assessment & Plan Note (Signed)
Bunion of left great toe.  Pain.  Refer to Dr Amalia Hailey.  ?

## 2021-04-30 NOTE — Assessment & Plan Note (Signed)
With afib.  On eliquis.  Follow.  

## 2021-04-30 NOTE — Assessment & Plan Note (Signed)
Blood pressure as outlined.  On hctz.  Follow pressures.  Follow metabolic panel.   

## 2021-05-02 ENCOUNTER — Telehealth: Payer: Self-pay | Admitting: Internal Medicine

## 2021-05-02 ENCOUNTER — Ambulatory Visit (INDEPENDENT_AMBULATORY_CARE_PROVIDER_SITE_OTHER): Payer: Medicare Other

## 2021-05-02 VITALS — Ht 63.0 in | Wt 226.0 lb

## 2021-05-02 DIAGNOSIS — Z0000 Encounter for general adult medical examination without abnormal findings: Secondary | ICD-10-CM | POA: Diagnosis not present

## 2021-05-02 NOTE — Patient Instructions (Addendum)
?  Bonnie Shaw , ?Thank you for taking time to come for your Medicare Wellness Visit. I appreciate your ongoing commitment to your health goals. Please review the following plan we discussed and let me know if I can assist you in the future.  ? ?These are the goals we discussed: ? Goals   ? ?  Follow up with Primary Care Provider   ?  As needed. ?  ? ?  ?  ?This is a list of the screening recommended for you and due dates:  ?Health Maintenance  ?Topic Date Due  ? Zoster (Shingles) Vaccine (1 of 2) 07/31/2021*  ? Tetanus Vaccine  09/07/2021*  ? COVID-19 Vaccine (4 - Booster for Pfizer series) 10/03/2021*  ? Colon Cancer Screening  03/10/2022  ? Mammogram  03/30/2023  ? Pneumonia Vaccine  Completed  ? Flu Shot  Completed  ? DEXA scan (bone density measurement)  Completed  ? Hepatitis C Screening: USPSTF Recommendation to screen - Ages 46-79 yo.  Completed  ? HPV Vaccine  Aged Out  ?*Topic was postponed. The date shown is not the original due date.  ?  ?

## 2021-05-02 NOTE — Telephone Encounter (Signed)
Spoke with patient assured her employee had advised nurse that she had forwarded ) 2 reading from a previous visit due to her pulse ox battery was weak or not working and that she had taken patient pulse while she was attaining her BP and CMA advised nurse she documented in a phone that the 02 reading was from a previous visit. I explained this patient and that it was an error in judgement and that the CMA knew and was advised that she stop and attain a new pulse OX from teammate or attain a new battery and not to move an previous 02 reading forward. ?

## 2021-05-02 NOTE — Telephone Encounter (Signed)
Patient called today and wanted to speak to the nurse supervisor about a complaint with a CMA on 04/30/2021. Patient stated she spoke with nurse supervisor that day and wanted a call back today.Patient was advised that office will have supervisor call her back.  ?

## 2021-05-02 NOTE — Telephone Encounter (Signed)
Patient seen today for AWV. Patient request call back form RN supervisor regarding message below.  ?

## 2021-05-02 NOTE — Progress Notes (Signed)
Subjective:   Bonnie Shaw is a 71 y.o. female who presents for Medicare Annual (Subsequent) preventive examination.  Review of Systems    No ROS.  Medicare Wellness Virtual Visit.  Visual/audio telehealth visit, UTA vital signs.   See social history for additional risk factors.   Cardiac Risk Factors include: advanced age (>37men, >64 women)     Objective:    Today's Vitals   05/02/21 1410  Weight: 226 lb (102.5 kg)  Height: 5\' 3"  (1.6 m)   Body mass index is 40.03 kg/m.     05/02/2021    2:17 PM 05/01/2020    3:51 PM 03/11/2019    9:11 AM 09/27/2017   11:07 PM 09/11/2017    7:02 AM 07/09/2016    6:20 PM 07/09/2016    4:13 PM  Advanced Directives  Does Patient Have a Medical Advance Directive? No Yes Yes Yes Yes    Type of Best boy of [place];Living will Healthcare Power of [place];Living will Healthcare Power of [place];Living will    Does patient want to make changes to medical advance directive?   No - Patient declined No - Patient declined     Copy of Healthcare Power of Attorney in Chart?   No - copy requested      Would patient like information on creating a medical advance directive? No - Patient declined     No - Patient declined No - Patient declined   Current Medications (verified) Outpatient Encounter Medications as of 05/02/2021  Medication Sig   ALPRAZolam (XANAX) 0.25 MG tablet Take 1 tablet by mouth x 1 - prior to procedure.  (Take with you to procedure).   atorvastatin (LIPITOR) 20 MG tablet TAKE 1 TABLET BY MOUTH  DAILY   cholecalciferol (VITAMIN D3) 25 MCG (1000 UT) tablet Take 1,000 Units by mouth daily. Taking 2,000mg  daily   ELIQUIS 5 MG TABS tablet TAKE 1 TABLET BY MOUTH  TWICE DAILY   hydrochlorothiazide (HYDRODIURIL) 25 MG tablet TAKE 1 TABLET BY MOUTH  DAILY   levothyroxine (SYNTHROID) 50 MCG tablet TAKE 1 TABLET BY MOUTH  DAILY   potassium chloride (KLOR-CON) 10 MEQ tablet TAKE 1 TABLET BY MOUTH  TWICE DAILY    sertraline (ZOLOFT) 50 MG tablet TAKE 1 TABLET BY MOUTH  DAILY   No facility-administered encounter medications on file as of 05/02/2021.   Allergies (verified) Ativan [lorazepam], Meloxicam, and Sulfonamide derivatives   History: Past Medical History:  Diagnosis Date   Anxiety    Arthritis    Cataract    Complication of anesthesia    Woke up during colonoscopy and during Knee surgery. 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   Hyperlipidemia    Hypertension    Hyperthyroidism    s/p RAI therapy   Hypothyroidism    s/p ablation   Left atrial enlargement    severely enlarged   Migraine headache    2x/yr   Motion sickness    roller coasters   Obesity    Panic disorder    Persistent atrial fibrillation (HCC)    Snoring    Past Surgical History:  Procedure Laterality Date   ABDOMINAL HYSTERECTOMY     CARDIOVERSION N/A 09/11/2017   Procedure: CARDIOVERSION;  Surgeon: Chilton Si, MD;  Location: [facility];  Service: Cardiovascular;  Laterality: N/A;   CATARACT  EXTRACTION     COLONOSCOPY WITH PROPOFOL N/A 03/11/2019   Procedure: COLONOSCOPY WITH PROPOFOL;  Surgeon: Wyline Mood, MD;  Location: [facility];  Service: Endoscopy;  Laterality: N/A;  colon   KNEE SURGERY Bilateral    replacements   POLYPECTOMY  03/11/2019   Procedure: POLYPECTOMY;  Surgeon: Wyline Mood, MD;  Location: [facility];  Service: Endoscopy;;   THYROID ABLATION  2010   Family History  Problem Relation Age of Onset   Alzheimer's disease Father    Stroke Mother    Cancer Sister        ovarian    Ovarian cancer Sister 1   Breast cancer Neg Hx    Social History   Socioeconomic History   Marital status: Married    Spouse name: Not on file   Number of children: 4   Years of education: Not on file   Highest education level: Not on file  Occupational History     Employer: UNEMPLOYED  Tobacco Use   Smoking status: Never   Smokeless tobacco: Never  Vaping Use   Vaping Use: Never used  Substance and Sexual Activity   Alcohol use: No   Drug use: No   Sexual activity: Not on file  Other Topics Concern   Not on file  Social History Narrative   Lives in [place] with husband, has 4 children and 15 grandkids         Attends [employer], 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[employer]   Retired Public house manager   Social Determinants of Corporate investment banker Strain: Low Risk    Difficulty of Paying Living Expenses: Not hard at all  Food Insecurity: No Food Insecurity   Worried About Programme researcher, broadcasting/film/video in the Last Year: Never true   Barista in the Last Year: Never true  Transportation Needs: No Transportation Needs   Lack of Transportation (Medical): No   Lack of Transportation (Non-Medical): No  Physical Activity: Not on file  Stress: No Stress Concern Present   Feeling of Stress : Only a little  Social Connections: Unknown   Frequency of Communication with Friends and Family: Not on file   Frequency of Social Gatherings with Friends and Family: Not on file   Attends Religious Services: Not on Scientist, clinical (histocompatibility and immunogenetics) or Organizations: Not on file   Attends Banker Meetings: Not on file   Marital Status: Married   Tobacco Counseling Counseling given: Not Answered  Clinical Intake: Pre-visit preparation completed: Yes        Diabetes: No  How often do you need to have someone help you when you read instructions, pamphlets, or other written materials from your doctor or pharmacy?: 1 - Never    Interpreter Needed?: No    Activities of Daily Living    05/02/2021    2:17 PM  In your present state of health, do you have any difficulty performing the following activities:  Hearing? 0  Vision? 0  Difficulty concentrating or making decisions? 0  Walking or climbing stairs? 0   Dressing or bathing? 0  Doing errands, shopping? 0  Preparing Food and eating ? N  Using the Toilet? N  In the past six months, have you accidently leaked urine? N  Do you have problems with loss of bowel control? N  Managing your Medications? N  Managing your Finances? N  Housekeeping or  managing your Housekeeping? N   Patient Care Team: Dale [place], MD as PCP - General (Internal Medicine) Hillis Range, MD as PCP - Electrophysiology (Cardiology) Hillis Range, MD as PCP - Cardiology (Cardiology)  Indicate any recent Medical Services you may have received from other than [facility] providers in the past year (date may be approximate).     Assessment:   This is a routine wellness examination for Delema.  Virtual Visit via Telephone Note  I connected with  Bonnie Shaw on 05/02/21 at  2:00 PM EDT by telephone and verified that I am speaking with the correct person using two identifiers.  Persons participating in the virtual visit: patient/Nurse Health Advisor   I discussed the limitations of performing an evaluation and management service by telehealth.  The patient expressed understanding and agreed to proceed. We continued and completed visit with audio only. Some vital signs may be absent or patient reported.   Hearing/Vision screen Hearing Screening - Comments:: Patient is able to hear conversational tones without difficulty. No issues reported. Vision Screening - Comments:: Cataract extraction, bilateral They have seen their ophthalmologist in the last 12 months.   Dietary issues and exercise activities discussed: Current Exercise Habits: Home exercise routine, Type of exercise: calisthenics (Stationary bike), Frequency (Times/Week): 3, Intensity: Mild Healthy diet Good water intake   Goals Addressed             This Visit's Progress    Follow up with Primary Care Provider       As needed.       Depression Screen    05/02/2021    2:14 PM 04/30/2021    1:14 PM  09/07/2020   11:21 AM 05/01/2020    3:35 PM 03/06/2020   10:56 AM 04/23/2017    9:45 AM 04/23/2017    9:44 AM  PHQ 2/9 Scores  PHQ - 2 Score 0 0 0 0 0 0 0  PHQ- 9 Score      1     Fall Risk    04/30/2021    1:13 PM 09/07/2020   11:21 AM 05/01/2020    3:34 PM 03/06/2020   10:55 AM 02/15/2019    9:16 AM  Fall Risk   Falls in the past year? 0 0 0 0 0  Number falls in past yr:  0 0    Injury with Fall?  0 0    Risk for fall due to : No Fall Risks      Follow up Falls evaluation completed Falls evaluation completed Falls evaluation completed Falls evaluation completed Falls evaluation completed    FALL RISK PREVENTION PERTAINING TO THE HOME: Home free of loose throw rugs in walkways, pet beds, electrical cords, etc? Yes  Adequate lighting in your home to reduce risk of falls? Yes   ASSISTIVE DEVICES UTILIZED TO PREVENT FALLS: Use of a cane, walker or w/c? No   TIMED UP AND GO: Was the test performed? No .   Cognitive Function:  Patient is alert and oriented x3.  Enjoys teaching, writing and other brain health stimulating activities. Normal cognitive status assessed by direct communication. Unable to complete 6CIT.       Immunizations Immunization History  Administered Date(s) Administered   Fluad Quad(high Dose 65+) 11/03/2018, 11/02/2019   Influenza Split 11/11/2011   Influenza, High Dose Seasonal PF 11/09/2013, 12/18/2014, 02/10/2017, 10/08/2017   Influenza,inj,Quad PF,6+ Mos 11/09/2013, 12/18/2014   Influenza-Unspecified 12/12/2015, 11/03/2020   PFIZER(Purple Top)SARS-COV-2 Vaccination 02/25/2019, 03/18/2019, 11/30/2019   Pneumococcal  Conjugate-13 12/18/2014   Pneumococcal Polysaccharide-23 12/19/2015   Screening Tests Health Maintenance  Topic Date Due   Zoster Vaccines- Shingrix (1 of 2) 07/31/2021 (Originally 09/23/1969)   TETANUS/TDAP  09/07/2021 (Originally 09/23/1969)   COVID-19 Vaccine (4 - Booster for Pfizer series) 10/03/2021 (Originally 01/25/2020)   COLONOSCOPY  (Pts 45-82yrs Insurance coverage will need to be confirmed)  03/10/2022   MAMMOGRAM  03/30/2023   Pneumonia Vaccine 56+ Years old  Completed   INFLUENZA VACCINE  Completed   DEXA SCAN  Completed   Hepatitis C Screening  Completed   HPV VACCINES  Aged Out   Health Maintenance There are no preventive care reminders to display for this patient.  Lung Cancer Screening: (Low Dose CT Chest recommended if Age 52-80 years, 30 pack-year currently smoking OR have quit w/in 15years.) does not qualify.   Vision Screening: Recommended annual ophthalmology exams for early detection of glaucoma and other disorders of the eye.  Dental Screening: Recommended annual dental exams for proper oral hygiene  Community Resource Referral / Chronic Care Management: CRR required this visit?  No   CCM required this visit?  No      Plan:   Keep all routine maintenance appointments.   Patient requests call back from RN supervisor in response to message called in early morning today regarding 3/28. Follow up response requested. RN notified. See chart review.   I have personally reviewed and noted the following in the patient's chart:   Medical and social history Use of alcohol, tobacco or illicit drugs  Current medications and supplements including opioid prescriptions.  Functional ability and status Nutritional status Physical activity Advanced directives List of other physicians Hospitalizations, surgeries, and ER visits in previous 12 months Vitals Screenings to include cognitive, depression, and falls Referrals and appointments  In addition, I have reviewed and discussed with patient certain preventive protocols, quality metrics, and best practice recommendations. A written personalized care plan for preventive services as well as general preventive health recommendations were provided to patient.     Ashok Pall, LPN   1/61/0960

## 2021-05-22 NOTE — Telephone Encounter (Signed)
Patient declined getting meds from pcp and doesn't need appt.  ?

## 2021-05-28 ENCOUNTER — Other Ambulatory Visit (INDEPENDENT_AMBULATORY_CARE_PROVIDER_SITE_OTHER): Payer: Medicare Other

## 2021-05-28 ENCOUNTER — Telehealth: Payer: Self-pay | Admitting: Internal Medicine

## 2021-05-28 DIAGNOSIS — R160 Hepatomegaly, not elsewhere classified: Secondary | ICD-10-CM | POA: Diagnosis not present

## 2021-05-28 DIAGNOSIS — M21619 Bunion of unspecified foot: Secondary | ICD-10-CM

## 2021-05-28 DIAGNOSIS — E785 Hyperlipidemia, unspecified: Secondary | ICD-10-CM | POA: Diagnosis not present

## 2021-05-28 DIAGNOSIS — I10 Essential (primary) hypertension: Secondary | ICD-10-CM

## 2021-05-28 DIAGNOSIS — R5383 Other fatigue: Secondary | ICD-10-CM | POA: Diagnosis not present

## 2021-05-28 DIAGNOSIS — R739 Hyperglycemia, unspecified: Secondary | ICD-10-CM

## 2021-05-28 DIAGNOSIS — K869 Disease of pancreas, unspecified: Secondary | ICD-10-CM | POA: Diagnosis not present

## 2021-05-28 LAB — BASIC METABOLIC PANEL
BUN: 13 mg/dL (ref 6–23)
CO2: 27 mEq/L (ref 19–32)
Calcium: 9.3 mg/dL (ref 8.4–10.5)
Chloride: 106 mEq/L (ref 96–112)
Creatinine, Ser: 0.71 mg/dL (ref 0.40–1.20)
GFR: 85.39 mL/min (ref >60.00)
Glucose, Bld: 108 mg/dL — ABNORMAL HIGH (ref 70–99)
Potassium: 3.9 mEq/L (ref 3.5–5.1)
Sodium: 141 mEq/L (ref 135–145)

## 2021-05-28 LAB — CBC WITH DIFFERENTIAL/PLATELET
Basophils Absolute: 0.1 10*3/uL (ref 0.0–0.1)
Basophils Relative: 0.7 % (ref 0.0–3.0)
Eosinophils Absolute: 0.1 10*3/uL (ref 0.0–0.7)
Eosinophils Relative: 1.6 % (ref 0.0–5.0)
HCT: 43.6 % (ref 36.0–46.0)
Hemoglobin: 14.8 g/dL (ref 12.0–15.0)
Lymphocytes Relative: 37 % (ref 12.0–46.0)
Lymphs Abs: 3.1 10*3/uL (ref 0.7–4.0)
MCHC: 33.9 g/dL (ref 30.0–36.0)
MCV: 94.1 fl (ref 78.0–100.0)
Monocytes Absolute: 0.8 10*3/uL (ref 0.1–1.0)
Monocytes Relative: 9.3 % (ref 3.0–12.0)
Neutro Abs: 4.3 10*3/uL (ref 1.4–7.7)
Neutrophils Relative %: 51.4 % (ref 43.0–77.0)
Platelets: 224 10*3/uL (ref 150.0–400.0)
RBC: 4.63 Mil/uL (ref 3.87–5.11)
RDW: 13.2 % (ref 11.5–15.5)
WBC: 8.3 10*3/uL (ref 4.0–10.5)

## 2021-05-28 LAB — HEPATIC FUNCTION PANEL
ALT: 12 U/L (ref 0–35)
AST: 14 U/L (ref 0–37)
Albumin: 4.2 g/dL (ref 3.5–5.2)
Alkaline Phosphatase: 61 U/L (ref 39–117)
Bilirubin, Direct: 0.2 mg/dL (ref 0.0–0.3)
Total Bilirubin: 1 mg/dL (ref 0.2–1.2)
Total Protein: 6.6 g/dL (ref 6.0–8.3)

## 2021-05-28 LAB — LIPID PANEL
Cholesterol: 173 mg/dL (ref 0–200)
HDL: 45.7 mg/dL (ref >39.00)
LDL Cholesterol: 89 mg/dL (ref 0–99)
NonHDL: 127.12
Total CHOL/HDL Ratio: 4
Triglycerides: 192 mg/dL — ABNORMAL HIGH (ref 0.0–149.0)
VLDL: 38.4 mg/dL (ref 0.0–40.0)

## 2021-05-28 LAB — HEMOGLOBIN A1C: Hgb A1c MFr Bld: 6 % (ref 4.6–6.5)

## 2021-05-28 NOTE — Telephone Encounter (Signed)
Pt want to get an update about her referral for podiatry ?

## 2021-05-29 NOTE — Telephone Encounter (Signed)
Order placed for podiatry referral.  Pt notified via my chart.  ?

## 2021-09-03 ENCOUNTER — Ambulatory Visit (INDEPENDENT_AMBULATORY_CARE_PROVIDER_SITE_OTHER): Payer: Medicare Other | Admitting: Internal Medicine

## 2021-09-03 ENCOUNTER — Encounter: Payer: Self-pay | Admitting: Internal Medicine

## 2021-09-03 DIAGNOSIS — K862 Cyst of pancreas: Secondary | ICD-10-CM

## 2021-09-03 DIAGNOSIS — I4821 Permanent atrial fibrillation: Secondary | ICD-10-CM | POA: Diagnosis not present

## 2021-09-03 DIAGNOSIS — I10 Essential (primary) hypertension: Secondary | ICD-10-CM | POA: Diagnosis not present

## 2021-09-03 DIAGNOSIS — M21619 Bunion of unspecified foot: Secondary | ICD-10-CM

## 2021-09-03 DIAGNOSIS — F419 Anxiety disorder, unspecified: Secondary | ICD-10-CM

## 2021-09-03 DIAGNOSIS — Z0000 Encounter for general adult medical examination without abnormal findings: Secondary | ICD-10-CM

## 2021-09-03 DIAGNOSIS — D6869 Other thrombophilia: Secondary | ICD-10-CM

## 2021-09-03 DIAGNOSIS — R739 Hyperglycemia, unspecified: Secondary | ICD-10-CM

## 2021-09-03 DIAGNOSIS — E785 Hyperlipidemia, unspecified: Secondary | ICD-10-CM

## 2021-09-03 DIAGNOSIS — E039 Hypothyroidism, unspecified: Secondary | ICD-10-CM

## 2021-09-03 DIAGNOSIS — Z1211 Encounter for screening for malignant neoplasm of colon: Secondary | ICD-10-CM

## 2021-09-03 NOTE — Assessment & Plan Note (Signed)
Physical today 09/03/21.  Mammogram 03/29/21 - Birads I. colonoscopy 03/2019 - adenoma (polyp) - f/u I 3 years.

## 2021-09-03 NOTE — Progress Notes (Signed)
Patient ID: Bonnie Shaw, female   DOB: [birth year], 71 y.o.   MRN: [id number]   Subjective:    Patient ID: Bonnie Shaw, female    DOB: Jun 20, 1950, 71 y.o.   MRN: [id number]   Patient here for a scheduled follow up.   Chief Complaint  Patient presents with   Follow-up    4 month follow up   .   HPI Here to follow up regarding hypertension, afib and hypercholesterolemia.  Reports she is doing relatively well.  No chest pain or sob reported.  No increased heart rate or palpitations.  No cough or congestion.  No abdominal pain.  Bowels moving.  Bunion - pain - request referral to podiatry.  Handling stress.     Past Medical History:  Diagnosis Date   Anxiety    Arthritis    Cataract    Complication of anesthesia    Woke up during colonoscopy and during Knee surgery. Also says "nitrous makes me crazy"   Family history of adverse reaction to anesthesia    son woke up during TEE   Guillain-Barre ([place]) 2018   recovered   Hyperlipidemia    Hypertension    Hyperthyroidism    s/p RAI therapy   Hypothyroidism    s/p ablation   Left atrial enlargement    severely enlarged   Migraine headache    2x/yr   Motion sickness    roller coasters   Obesity    Panic disorder    Persistent atrial fibrillation ([place])    Snoring    Past Surgical History:  Procedure Laterality Date   ABDOMINAL HYSTERECTOMY     CARDIOVERSION N/A 09/11/2017   Procedure: CARDIOVERSION;  Surgeon: Skeet Latch, MD;  Location: [place];  Service: Cardiovascular;  Laterality: N/A;   CATARACT EXTRACTION     COLONOSCOPY WITH PROPOFOL N/A 03/11/2019   Procedure: COLONOSCOPY WITH PROPOFOL;  Surgeon: Jonathon Bellows, MD;  Location: [place];  Service: Endoscopy;  Laterality: N/A;  colon   KNEE SURGERY Bilateral    replacements   POLYPECTOMY  03/11/2019   Procedure: POLYPECTOMY;  Surgeon: Jonathon Bellows, MD;  Location: [place];  Service: Endoscopy;;   THYROID ABLATION  2010   Family History   Problem Relation Age of Onset   Alzheimer's disease Father    Stroke Mother    Cancer Sister        ovarian    Ovarian cancer Sister 64   Breast cancer Neg Hx    Social History   Socioeconomic History   Marital status: Married    Spouse name: Not on file   Number of children: 4   Years of education: Not on file   Highest education level: Not on file  Occupational History    Employer: UNEMPLOYED  Tobacco Use   Smoking status: Never   Smokeless tobacco: Never  Vaping Use   Vaping Use: Never used  Substance and Sexual Activity   Alcohol use: No   Drug use: No   Sexual activity: Not on file  Other Topics Concern   Not on file  Social History Narrative   Lives in [place] with husband, has 4 children and 15 grandkids         Attends [employer], 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   Social Determinants of Health   Financial Resource Strain: [place]  (05/02/2021)   Overall Emergency planning/management officer  Strain (CARDIA)    Difficulty of Paying Living Expenses: Not hard at all  Food Insecurity: No Food Insecurity (05/02/2021)   Hunger Vital Sign    Worried About Running Out of Food in the Last Year: Never true    Ran Out of Food in the Last Year: Never true  Transportation Needs: No Transportation Needs (05/02/2021)   PRAPARE - Hydrologist (Medical): No    Lack of Transportation (Non-Medical): No  Physical Activity: Not on file  Stress: No Stress Concern Present (05/02/2021)   [place]    Feeling of Stress : Only a little  Social Connections: Unknown (05/02/2021)   Social Connection and Isolation Panel [NHANES]    Frequency of Communication with Friends and Family: Not on file    Frequency of Social Gatherings with Friends and Family: Not on file    Attends Religious Services: Not on file    Active Member of Clubs or  Organizations: Not on file    Attends Archivist Meetings: Not on file    Marital Status: Married     Review of Systems  Constitutional:  Negative for appetite change and unexpected weight change.  HENT:  Negative for congestion and sinus pressure.   Respiratory:  Negative for cough, chest tightness and shortness of breath.   Cardiovascular:  Negative for chest pain, palpitations and leg swelling.  Gastrointestinal:  Negative for abdominal pain, diarrhea, nausea and vomiting.  Genitourinary:  Negative for difficulty urinating and dysuria.  Musculoskeletal:  Negative for joint swelling and myalgias.  Skin:  Negative for color change and rash.  Neurological:  Negative for dizziness, light-headedness and headaches.  Psychiatric/Behavioral:  Negative for agitation and dysphoric mood.        Objective:     BP (!) 150/80 (BP Location: Left Arm, Patient Position: Sitting, Cuff Size: Normal)   Pulse 77   Temp 97.6 F (36.4 C) (Oral)   Ht 5' 3"  (1.6 m)   Wt 227 lb 3.2 oz (103.1 kg)   SpO2 96%   BMI 40.25 kg/m  Wt Readings from Last 3 Encounters:  09/03/21 227 lb 3.2 oz (103.1 kg)  05/02/21 226 lb (102.5 kg)  04/30/21 226 lb (102.5 kg)    Physical Exam Vitals reviewed.  Constitutional:      General: She is not in acute distress.    Appearance: Normal appearance.  HENT:     Head: Normocephalic and atraumatic.     Right Ear: External ear normal.     Left Ear: External ear normal.  Eyes:     General: No scleral icterus.       Right eye: No discharge.        Left eye: No discharge.     Conjunctiva/sclera: Conjunctivae normal.  Neck:     Thyroid: No thyromegaly.  Cardiovascular:     Rate and Rhythm: Normal rate.     Comments: Rate controlled.  Pulmonary:     Effort: No respiratory distress.     Breath sounds: Normal breath sounds. No wheezing.  Abdominal:     General: Bowel sounds are normal.     Palpations: Abdomen is soft.     Tenderness: There is no  abdominal tenderness.  Musculoskeletal:        General: No swelling or tenderness.     Cervical back: Neck supple. No tenderness.  Lymphadenopathy:     Cervical: No cervical adenopathy.  Skin:  Findings: No erythema or rash.  Neurological:     Mental Status: She is alert.  Psychiatric:        Mood and Affect: Mood normal.        Behavior: Behavior normal.      Outpatient Encounter Medications as of 09/03/2021  Medication Sig   ALPRAZolam (XANAX) 0.25 MG tablet Take 1 tablet by mouth x 1 - prior to procedure.  (Take with you to procedure).   atorvastatin (LIPITOR) 20 MG tablet TAKE 1 TABLET BY MOUTH  DAILY   cholecalciferol (VITAMIN D3) 25 MCG (1000 UT) tablet Take 1,000 Units by mouth daily. Taking 2,0100m daily   ELIQUIS 5 MG TABS tablet TAKE 1 TABLET BY MOUTH  TWICE DAILY   hydrochlorothiazide (HYDRODIURIL) 25 MG tablet TAKE 1 TABLET BY MOUTH  DAILY   levothyroxine (SYNTHROID) 50 MCG tablet TAKE 1 TABLET BY MOUTH  DAILY   potassium chloride (KLOR-CON) 10 MEQ tablet TAKE 1 TABLET BY MOUTH  TWICE DAILY   sertraline (ZOLOFT) 50 MG tablet TAKE 1 TABLET BY MOUTH  DAILY   No facility-administered encounter medications on file as of 09/03/2021.     Lab Results  Component Value Date   WBC 8.3 05/28/2021   HGB 14.8 05/28/2021   HCT 43.6 05/28/2021   PLT 224.0 05/28/2021   GLUCOSE 108 (H) 05/28/2021   CHOL 173 05/28/2021   TRIG 192.0 (H) 05/28/2021   HDL 45.70 05/28/2021   LDLDIRECT 97.0 11/01/2018   LDLCALC 89 05/28/2021   ALT 12 05/28/2021   AST 14 05/28/2021   NA 141 05/28/2021   K 3.9 05/28/2021   CL 106 05/28/2021   CREATININE 0.71 05/28/2021   BUN 13 05/28/2021   CO2 27 05/28/2021   TSH 3.22 02/15/2021   HGBA1C 6.0 05/28/2021   MICROALBUR <0.7 06/11/2015    MM 3D SCREEN BREAST BILATERAL  Result Date: 03/29/2021 CLINICAL DATA:  Screening. EXAM: DIGITAL SCREENING BILATERAL MAMMOGRAM WITH TOMOSYNTHESIS AND CAD TECHNIQUE: Bilateral screening digital craniocaudal and  mediolateral oblique mammograms were obtained. Bilateral screening digital breast tomosynthesis was performed. The images were evaluated with computer-aided detection. COMPARISON:  Previous exam(s). ACR Breast Density Category b: There are scattered areas of fibroglandular density. FINDINGS: There are no findings suspicious for malignancy. IMPRESSION: No mammographic evidence of malignancy. A result letter of this screening mammogram will be mailed directly to the patient. RECOMMENDATION: Screening mammogram in one year. (Code:SM-B-01Y) BI-RADS CATEGORY  1: Negative. Electronically Signed   By: JMargarette CanadaM.D.   On: 03/29/2021 16:04      Assessment & Plan:   Problem List Items Addressed This Visit     Acquired thrombophilia ([place]    With afib.  On eliquis.  Follow.       Anxiety    Continue zoloft.  Stable.  Follow.        Atrial fibrillation ([place]    Continue eliquis.  Rated controlled.  She is asymptomatic.  Had issues with bradycardia previously on toprol and flecainide.  No chest pain.  Breathing stable.  Follow.        Bunion    Bunion  - pain.  Refer to podiatry.  Follow.       Colon cancer screening    Colonoscopy 03/2019.  Recommended f/u in 3 years.  She request to follow up with Dr TAlice Reichert       Healthcare maintenance    Physical today 09/03/21.  Mammogram 03/29/21 - Birads I. colonoscopy 03/2019 - adenoma (polyp) - f/u  I 3 years.       Hyperglycemia    Low carb diet and exercise.  Follow met b and a1c.       Relevant Orders   Hemoglobin A1c   Hyperlipidemia    On lipitor.  Low cholesterol diet and exercise.  Follow lipid panel and liver function tests.        Relevant Orders   Hepatic function panel   Lipid panel   Hypertension    Blood pressure as outlined.  On hctz.  Follow pressures.  Follow metabolic panel.        Relevant Orders   Basic metabolic panel   Hypothyroidism    On thyroid replacement.  Follow tsh.       Pancreatic cyst    MRI 03/22/21 -  67m pancreatic cystic lesion - likely benign.  Recommended f/u in one year.          CEinar Pheasant MD

## 2021-09-08 ENCOUNTER — Telehealth: Payer: Self-pay | Admitting: Internal Medicine

## 2021-09-08 ENCOUNTER — Encounter: Payer: Self-pay | Admitting: Internal Medicine

## 2021-09-08 NOTE — Assessment & Plan Note (Signed)
With afib.  On eliquis.  Follow.  

## 2021-09-08 NOTE — Assessment & Plan Note (Signed)
Colonoscopy 03/2019.  Recommended f/u in 3 years.  She request to follow up with Dr Toledo.  

## 2021-09-08 NOTE — Assessment & Plan Note (Signed)
MRI 03/22/21 - 3mm pancreatic cystic lesion - likely benign.  Recommended f/u in one year.   

## 2021-09-08 NOTE — Assessment & Plan Note (Signed)
Blood pressure as outlined.  On hctz.  Follow pressures.  Follow metabolic panel.   

## 2021-09-08 NOTE — Assessment & Plan Note (Signed)
Continue zoloft.  Stable.  Follow.   

## 2021-09-08 NOTE — Telephone Encounter (Signed)
I placed order for podiatry referral 05/2021.  She informed me no appt has been scheduled.  Can you help with this.  Let me know if I need to do anything.

## 2021-09-08 NOTE — Assessment & Plan Note (Signed)
On thyroid replacement.  Follow tsh.  

## 2021-09-08 NOTE — Assessment & Plan Note (Signed)
On lipitor.  Low cholesterol diet and exercise.  Follow lipid panel and liver function tests.   

## 2021-09-08 NOTE — Assessment & Plan Note (Signed)
Low carb diet and exercise.  Follow met b and a1c.  

## 2021-09-08 NOTE — Assessment & Plan Note (Signed)
Bunion  - pain.  Refer to podiatry.  Follow.

## 2021-09-08 NOTE — Assessment & Plan Note (Signed)
Continue eliquis.  Rated controlled.  She is asymptomatic.  Had issues with bradycardia previously on toprol and flecainide.  No chest pain.  Breathing stable.  Follow.

## 2021-09-11 NOTE — Telephone Encounter (Signed)
Thank you very much 

## 2021-09-17 ENCOUNTER — Ambulatory Visit: Payer: Self-pay | Admitting: Podiatry

## 2021-10-01 ENCOUNTER — Other Ambulatory Visit: Payer: Medicare Other

## 2021-10-11 ENCOUNTER — Ambulatory Visit (INDEPENDENT_AMBULATORY_CARE_PROVIDER_SITE_OTHER): Payer: Medicare Other

## 2021-10-11 ENCOUNTER — Ambulatory Visit (INDEPENDENT_AMBULATORY_CARE_PROVIDER_SITE_OTHER): Payer: Medicare Other | Admitting: Podiatry

## 2021-10-11 ENCOUNTER — Other Ambulatory Visit (INDEPENDENT_AMBULATORY_CARE_PROVIDER_SITE_OTHER): Payer: Medicare Other

## 2021-10-11 DIAGNOSIS — E785 Hyperlipidemia, unspecified: Secondary | ICD-10-CM | POA: Diagnosis not present

## 2021-10-11 DIAGNOSIS — M7752 Other enthesopathy of left foot: Secondary | ICD-10-CM

## 2021-10-11 DIAGNOSIS — I10 Essential (primary) hypertension: Secondary | ICD-10-CM

## 2021-10-11 DIAGNOSIS — R739 Hyperglycemia, unspecified: Secondary | ICD-10-CM | POA: Diagnosis not present

## 2021-10-11 LAB — HEPATIC FUNCTION PANEL
ALT: 14 U/L (ref 0–35)
AST: 15 U/L (ref 0–37)
Albumin: 4.2 g/dL (ref 3.5–5.2)
Alkaline Phosphatase: 60 U/L (ref 39–117)
Bilirubin, Direct: 0.2 mg/dL (ref 0.0–0.3)
Total Bilirubin: 1.6 mg/dL — ABNORMAL HIGH (ref 0.2–1.2)
Total Protein: 6.7 g/dL (ref 6.0–8.3)

## 2021-10-11 LAB — LIPID PANEL
Cholesterol: 170 mg/dL (ref 0–200)
HDL: 41 mg/dL (ref >39.00)
LDL Cholesterol: 90 mg/dL (ref 0–99)
NonHDL: 128.95
Total CHOL/HDL Ratio: 4
Triglycerides: 196 mg/dL — ABNORMAL HIGH (ref 0.0–149.0)
VLDL: 39.2 mg/dL (ref 0.0–40.0)

## 2021-10-11 LAB — BASIC METABOLIC PANEL
BUN: 16 mg/dL (ref 6–23)
CO2: 27 mEq/L (ref 19–32)
Calcium: 9.6 mg/dL (ref 8.4–10.5)
Chloride: 103 mEq/L (ref 96–112)
Creatinine, Ser: 0.8 mg/dL (ref 0.40–1.20)
GFR: 73.8 mL/min (ref >60.00)
Glucose, Bld: 106 mg/dL — ABNORMAL HIGH (ref 70–99)
Potassium: 3.6 mEq/L (ref 3.5–5.1)
Sodium: 141 mEq/L (ref 135–145)

## 2021-10-11 LAB — HEMOGLOBIN A1C: Hgb A1c MFr Bld: 6 % (ref 4.6–6.5)

## 2021-10-11 MED ORDER — BETAMETHASONE SOD PHOS & ACET 6 (3-3) MG/ML IJ SUSP
3.0000 mg | Freq: Once | INTRAMUSCULAR | Status: AC
  Administered 2021-10-11: 3 mg via INTRA_ARTICULAR

## 2021-10-11 NOTE — Progress Notes (Signed)
Chief Complaint  Patient presents with   Bunions    Patient is here for bunion on left foot.    HPI: 71 y.o. female presenting today as a reestablish new patient for evaluation of pain and tenderness associated to the left great toe joint.  Patient states that she has a bunion on her left foot and it is very painful and tender.  She has not done anything for treatment.  She presents for further treatment and evaluation  Past Medical History:  Diagnosis Date   Anxiety    Arthritis    Cataract    Complication of anesthesia    Woke up during colonoscopy and during Knee surgery. Also says "nitrous makes me crazy"   Family history of adverse reaction to anesthesia    son woke up during TEE   Guillain-Barre ([place]) 2018   recovered   Hyperlipidemia    Hypertension    Hyperthyroidism    s/p RAI therapy   Hypothyroidism    s/p ablation   Left atrial enlargement    severely enlarged   Migraine headache    2x/yr   Motion sickness    roller coasters   Obesity    Panic disorder    Persistent atrial fibrillation ([place])    Snoring     Past Surgical History:  Procedure Laterality Date   ABDOMINAL HYSTERECTOMY     CARDIOVERSION N/A 09/11/2017   Procedure: CARDIOVERSION;  Surgeon: Skeet Latch, MD;  Location: [facility];  Service: Cardiovascular;  Laterality: N/A;   CATARACT EXTRACTION     COLONOSCOPY WITH PROPOFOL N/A 03/11/2019   Procedure: COLONOSCOPY WITH PROPOFOL;  Surgeon: Jonathon Bellows, MD;  Location: [facility];  Service: Endoscopy;  Laterality: N/A;  colon   KNEE SURGERY Bilateral    replacements   POLYPECTOMY  03/11/2019   Procedure: POLYPECTOMY;  Surgeon: Jonathon Bellows, MD;  Location: [facility];  Service: Endoscopy;;   THYROID ABLATION  2010    Allergies  Allergen Reactions   Ativan [Lorazepam] Other (See Comments)    High sensitivity, cause pt to pass out   Meloxicam Other (See Comments)    Hallucinations   Sulfonamide Derivatives Rash      Physical Exam: General: The patient is alert and oriented x3 in no acute distress.  Dermatology: Skin is warm, dry and supple bilateral lower extremities. Negative for open lesions or macerations.  Vascular: Palpable pedal pulses bilaterally. Capillary refill within normal limits.  Negative for any significant edema or erythema  Neurological: Light touch and protective threshold grossly intact  Musculoskeletal Exam: No pedal deformities noted.  Limited range of motion to the first MTP left foot.  There is some crepitus as well.  Pain with palpation and range of motion.  There is clinical evidence of enlargement of the head of the first metatarsal consistent with a bunion deformity with a slight lateral deviation of the hallux  Radiographic Exam:  Normal osseous mineralization.  Advanced degenerative changes with periarticular spurring and joint erosion noted to the first MTP left foot consistent with hallux limitus.  No acute fractures identified  Assessment: 1.  Hallux limitus/advanced DJD left foot   Plan of Care:  1. Patient evaluated. X-Rays reviewed.  2.  Today we discussed different treatment options both conservative and surgical.  The patient does have a history of arthritis with bilateral knee replacements.  She also receives injections into her hands.  We discussed conservative treatment including cortisone injection and shoe gear modifications including wide shoes  as well as surgery for a joint replacement.  Patient would like to discuss possible surgery with her family 3.  In the meantime injection of 0.5 cc Celestone Soluspan injected in the first MTP left foot 4.  Return to clinic in 4 weeks for reevaluation and possible surgical consultation      Edrick Kins, DPM [facility]  Dr. Edrick Kins, DPM    [address], [place] [address]                Office [phone]  Fax [phone]

## 2021-10-14 ENCOUNTER — Telehealth: Payer: Self-pay

## 2021-10-14 NOTE — Telephone Encounter (Signed)
Patient states she is returning our call.  I read Dr. Charlene Scott's message to patient.  

## 2021-10-14 NOTE — Telephone Encounter (Signed)
Lvm for pt to return call in regards to lab results.  Per Dr.Scott: Notify cholesterol and overall sugar control stable.  Continue low carb diet and exercise.  We will follow.  Bilirubin stable. Kidney function tests and liver function tests are wnl.

## 2021-10-26 ENCOUNTER — Other Ambulatory Visit: Payer: Self-pay | Admitting: Internal Medicine

## 2021-10-29 ENCOUNTER — Ambulatory Visit (INDEPENDENT_AMBULATORY_CARE_PROVIDER_SITE_OTHER): Payer: Medicare Other | Admitting: Podiatry

## 2021-10-29 DIAGNOSIS — M7752 Other enthesopathy of left foot: Secondary | ICD-10-CM

## 2021-10-29 NOTE — Progress Notes (Signed)
Chief Complaint  Patient presents with   Follow-up    Patient is here for left foot follow-up, she states that the foot is feeling ok.    HPI: 71 y.o. female presenting today for follow-up evaluation of hallux limitus/DJD to the left great toe joint.  Patient states that the injection helped for about 2 weeks and she felt great.  After the 2 weeks the pain to the left great toe joint returned.  It affects her ability to wear any closed toed shoes because of the pain and irritation to the area.  She presents for further treatment and evaluation  Past Medical History:  Diagnosis Date   Anxiety    Arthritis    Cataract    Complication of anesthesia    Woke up during colonoscopy and during Knee surgery. Also says "nitrous makes me crazy"   Family history of adverse reaction to anesthesia    son woke up during TEE   Guillain-Barre ([place]) 2018   recovered   Hyperlipidemia    Hypertension    Hyperthyroidism    s/p RAI therapy   Hypothyroidism    s/p ablation   Left atrial enlargement    severely enlarged   Migraine headache    2x/yr   Motion sickness    roller coasters   Obesity    Panic disorder    Persistent atrial fibrillation ([place])    Snoring     Past Surgical History:  Procedure Laterality Date   ABDOMINAL HYSTERECTOMY     CARDIOVERSION N/A 09/11/2017   Procedure: CARDIOVERSION;  Surgeon: Skeet Latch, MD;  Location: [facility];  Service: Cardiovascular;  Laterality: N/A;   CATARACT EXTRACTION     COLONOSCOPY WITH PROPOFOL N/A 03/11/2019   Procedure: COLONOSCOPY WITH PROPOFOL;  Surgeon: Jonathon Bellows, MD;  Location: [facility];  Service: Endoscopy;  Laterality: N/A;  colon   KNEE SURGERY Bilateral    replacements   POLYPECTOMY  03/11/2019   Procedure: POLYPECTOMY;  Surgeon: Jonathon Bellows, MD;  Location: [facility];  Service: Endoscopy;;   THYROID ABLATION  2010    Allergies  Allergen Reactions   Ativan [Lorazepam] Other (See Comments)    High  sensitivity, cause pt to pass out   Meloxicam Other (See Comments)    Hallucinations   Sulfonamide Derivatives Rash     Physical Exam: General: The patient is alert and oriented x3 in no acute distress.  Dermatology: Skin is warm, dry and supple bilateral lower extremities. Negative for open lesions or macerations.  Vascular: Palpable pedal pulses bilaterally. Capillary refill within normal limits.  Negative for any significant edema or erythema  Neurological: Light touch and protective threshold grossly intact  Musculoskeletal Exam: No change since last visit.  No pedal deformities noted.  Limited range of motion to the first MTP left foot.  There is some crepitus as well.  Pain with palpation and range of motion.  There is clinical evidence of enlargement of the head of the first metatarsal consistent with a bunion deformity with a slight lateral deviation of the hallux  Radiographic Exam LT foot 10/11/2021:  Normal osseous mineralization.  Advanced degenerative changes with periarticular spurring and joint erosion noted to the first MTP left foot consistent with hallux limitus.  No acute fractures identified  Assessment: 1.  Hallux limitus/advanced DJD left foot   Plan of Care:  1. Patient evaluated.  2.  Today again we discussed surgery in detail.  Risk benefits advantages and disadvantages explained.  No  guarantees were expressed or implied.  All patient questions answered.  Postoperative recovery course also explained in detail.  Patient states that she discussed with her family and she would like to proceed with surgery to correct for the advanced DJD of the toe 3.  Authorization for surgery was initiated today.  Surgery will consist of left great toe arthroplasty with implant 4.  Patient will need clearance from her PCP. 5.  Return to clinic 1 week postop     Edrick Kins, DPM [facility]  Dr. Edrick Kins, DPM    [address],  [address]                Office [phone]  Fax [phone]

## 2021-10-30 ENCOUNTER — Telehealth: Payer: Self-pay | Admitting: Internal Medicine

## 2021-10-30 NOTE — Telephone Encounter (Signed)
Received a note from podiatry.  If she is planning to have surgery, will need pre op evaluation with me scheduled.  Not sure if scheduled, but will need if she is planning to have surgery.

## 2021-10-31 NOTE — Telephone Encounter (Signed)
Can we change her appt to 11/14/21 at 11:30.  Just incase we need to do any tests prior her surgery.  Thanks

## 2021-10-31 NOTE — Telephone Encounter (Signed)
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  Zamarah Ullmer,cma

## 2021-10-31 NOTE — Telephone Encounter (Signed)
I called the patient and informed her that she would need to be evaluated per the provider for surgery.  Her surgery is scheduled for 12/05/2021 and I scheduled her in October for a evaluation visit with the provider.  Leilan Bochenek,cma

## 2021-11-01 NOTE — Telephone Encounter (Signed)
Appointment was changed  per provider and I LVM infroming the patient. Janayah Zavada,cma

## 2021-11-01 NOTE — Telephone Encounter (Signed)
Please change appt to 11/20/21 at 11:00.

## 2021-11-06 ENCOUNTER — Telehealth: Payer: Self-pay | Admitting: Urology

## 2021-11-06 NOTE — Telephone Encounter (Signed)
DOS - 12/05/21  KELLER BUNION IMPLANT LEFT --- [id number]   UHC EFFECTIVE DATE - 02/03/21  PLAN DEDUCTIBLE - $200.00 W/ $0.00 REMAINING OUT OF POCKET - $2,200.00 W/ $2,000.00 REMAINING COINSURANCE - 0% COPAY - $0.00  PER UHC WEBSITE FOR CPT CODE 28291Notification or Prior Authorization is not required for the requested services  This [employer] Medicare Advantage members plan does not currently require a prior authorization for these services. If you have general questions about the prior authorization requirements, please call us at [phone] or visit [email or web address] > Clinician Resources > Advance and Admission Notification Requirements. The number above acknowledges your notification. Please write this number down for future reference. Notification is not a guarantee of coverage or payment.  Decision ID #:[id number]

## 2021-11-20 ENCOUNTER — Ambulatory Visit (INDEPENDENT_AMBULATORY_CARE_PROVIDER_SITE_OTHER): Payer: Medicare Other | Admitting: Internal Medicine

## 2021-11-20 VITALS — BP 138/82 | HR 87 | Temp 97.9°F | Ht 63.0 in | Wt 227.2 lb

## 2021-11-20 DIAGNOSIS — Z23 Encounter for immunization: Secondary | ICD-10-CM

## 2021-11-20 DIAGNOSIS — Z01818 Encounter for other preprocedural examination: Secondary | ICD-10-CM | POA: Diagnosis not present

## 2021-11-20 DIAGNOSIS — D6869 Other thrombophilia: Secondary | ICD-10-CM

## 2021-11-20 DIAGNOSIS — E039 Hypothyroidism, unspecified: Secondary | ICD-10-CM

## 2021-11-20 DIAGNOSIS — I10 Essential (primary) hypertension: Secondary | ICD-10-CM | POA: Diagnosis not present

## 2021-11-20 DIAGNOSIS — I4821 Permanent atrial fibrillation: Secondary | ICD-10-CM

## 2021-11-20 DIAGNOSIS — M21619 Bunion of unspecified foot: Secondary | ICD-10-CM

## 2021-11-20 DIAGNOSIS — R739 Hyperglycemia, unspecified: Secondary | ICD-10-CM

## 2021-11-20 DIAGNOSIS — E785 Hyperlipidemia, unspecified: Secondary | ICD-10-CM

## 2021-11-20 NOTE — Progress Notes (Addendum)
Patient ID: GWENNETH WHITEMAN, female   DOB: 27-Dec-1950, 71 y.o.   MRN: [id number]   Subjective:    Patient ID: DESTANE SPEAS, female    DOB: 07-05-1950, 71 y.o.   MRN: [id number]   Patient here for  Chief Complaint  Patient presents with   Pre-op Exam   .   HPI Here to follow up regarding hypercholesterolemia, afib and hypertension. Seeing rheumatology - OA - multiple joints. Stable - Tylenol.  Saw podiatry - bunion. S/p injection. Helped for 2 weeks.  Reevaluated 10/29/21 - plan for left great toe arthroplasty with implant - 12/05/21.  She reports she is doing well.  Trying to stay active.  Riding her bike.  No chest pain or sob with activity.  Denies any notice of increased heart rate or palpitations.  No cough or congestion.  No acid reflux.  No abdominal pain.  Bowels moving.  Also reports some persistent bladder issues.  Reports increased urinary frequency and is questioning if emptying her bladder fully.  Discussed urology evaluation.     Past Medical History:  Diagnosis Date   Anxiety    Arthritis    Cataract    Complication of anesthesia    Woke up during colonoscopy and during Knee surgery. Also says "nitrous makes me crazy"   Family history of adverse reaction to anesthesia    son woke up during TEE   Guillain-Barre ([place]) 2018   recovered   Hyperlipidemia    Hypertension    Hyperthyroidism    s/p RAI therapy   Hypothyroidism    s/p ablation   Left atrial enlargement    severely enlarged   Migraine headache    2x/yr   Motion sickness    roller coasters   Obesity    Panic disorder    Persistent atrial fibrillation ([place])    Snoring    Past Surgical History:  Procedure Laterality Date   ABDOMINAL HYSTERECTOMY     CARDIOVERSION N/A 09/11/2017   Procedure: CARDIOVERSION;  Surgeon: Skeet Latch, MD;  Location: [facility];  Service: Cardiovascular;  Laterality: N/A;   CATARACT EXTRACTION     COLONOSCOPY WITH PROPOFOL N/A 03/11/2019   Procedure: COLONOSCOPY WITH  PROPOFOL;  Surgeon: Jonathon Bellows, MD;  Location: [facility];  Service: Endoscopy;  Laterality: N/A;  colon   KNEE SURGERY Bilateral    replacements   POLYPECTOMY  03/11/2019   Procedure: POLYPECTOMY;  Surgeon: Jonathon Bellows, MD;  Location: [facility];  Service: Endoscopy;;   THYROID ABLATION  2010   Family History  Problem Relation Age of Onset   Alzheimer's disease Father    Stroke Mother    Cancer Sister        ovarian    Ovarian cancer Sister 21   Breast cancer Neg Hx    Social History   Socioeconomic History   Marital status: Married    Spouse name: Not on file   Number of children: 4   Years of education: Not on file   Highest education level: Not on file  Occupational History    Employer: UNEMPLOYED  Tobacco Use   Smoking status: Never   Smokeless tobacco: Never  Vaping Use   Vaping Use: Never used  Substance and Sexual Activity   Alcohol use: No   Drug use: No   Sexual activity: Not on file  Other Topics Concern   Not on file  Social History Narrative   Lives in [place] with husband, has 4 children and  85 grandkids         Attends [employer], 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   Social Determinants of [employer]: [place]  (05/02/2021)   Overall Financial Resource Strain (CARDIA)    Difficulty of Paying Living Expenses: Not hard at all  Food Insecurity: No Food Insecurity (05/02/2021)   Hunger Vital Sign    Worried About Running Out of Food in the Last Year: Never true    [place] in the Last Year: Never true  Transportation Needs: No Transportation Needs (05/02/2021)   PRAPARE - Hydrologist (Medical): No    Lack of Transportation (Non-Medical): No  Physical Activity: Not on file  Stress: No Stress Concern Present (05/02/2021)   Platter    Feeling of  Stress : Only a little  Social Connections: Unknown (05/02/2021)   Social Connection and Isolation Panel [NHANES]    Frequency of Communication with Friends and Family: Not on file    Frequency of Social Gatherings with Friends and Family: Not on file    Attends Religious Services: Not on file    Active Member of Clubs or Organizations: Not on file    Attends Archivist Meetings: Not on file    Marital Status: Married     Review of Systems  Constitutional:  Negative for appetite change and unexpected weight change.  HENT:  Negative for congestion and sinus pressure.   Respiratory:  Negative for cough, chest tightness and shortness of breath.   Cardiovascular:  Negative for chest pain, palpitations and leg swelling.  Gastrointestinal:  Negative for abdominal pain, diarrhea, nausea and vomiting.  Genitourinary:  Positive for frequency. Negative for dysuria.  Musculoskeletal:  Negative for joint swelling and myalgias.  Skin:  Negative for color change and rash.  Neurological:  Negative for dizziness and headaches.  Psychiatric/Behavioral:  Negative for agitation and dysphoric mood.        Objective:     BP 138/82 (BP Location: Left Arm, Patient Position: Sitting, Cuff Size: Large)   Pulse 87   Temp 97.9 F (36.6 C) (Oral)   Ht $R'5\' 3"'hK$  (1.6 m)   Wt 227 lb 3.2 oz (103.1 kg)   SpO2 97%   BMI 40.25 kg/m  Wt Readings from Last 3 Encounters:  11/20/21 227 lb 3.2 oz (103.1 kg)  09/03/21 227 lb 3.2 oz (103.1 kg)  05/02/21 226 lb (102.5 kg)    Physical Exam Vitals reviewed.  Constitutional:      General: She is not in acute distress.    Appearance: Normal appearance.  HENT:     Head: Normocephalic and atraumatic.     Right Ear: External ear normal.     Left Ear: External ear normal.     Mouth/Throat:     Pharynx: Oropharynx is clear. No oropharyngeal exudate or posterior oropharyngeal erythema.  Eyes:     General: No scleral icterus.       Right eye: No discharge.         Left eye: No discharge.     Conjunctiva/sclera: Conjunctivae normal.  Neck:     Thyroid: No thyromegaly.  Cardiovascular:     Rate and Rhythm: Normal rate. Rhythm irregular.  Pulmonary:     Effort: No respiratory distress.     Breath sounds: Normal breath sounds.  No wheezing.  Abdominal:     General: Bowel sounds are normal.     Palpations: Abdomen is soft.     Tenderness: There is no abdominal tenderness.  Musculoskeletal:        General: No swelling or tenderness.     Cervical back: Neck supple. No tenderness.  Lymphadenopathy:     Cervical: No cervical adenopathy.  Skin:    Findings: No erythema or rash.  Neurological:     Mental Status: She is alert.  Psychiatric:        Mood and Affect: Mood normal.        Behavior: Behavior normal.      Outpatient Encounter Medications as of 11/20/2021  Medication Sig   ALPRAZolam (XANAX) 0.25 MG tablet Take 1 tablet by mouth x 1 - prior to procedure.  (Take with you to procedure).   atorvastatin (LIPITOR) 20 MG tablet TAKE 1 TABLET BY MOUTH ONCE DAILY   cholecalciferol (VITAMIN D3) 25 MCG (1000 UT) tablet Take 1,000 Units by mouth daily. Taking 2,068m daily   ELIQUIS 5 MG TABS tablet TAKE 1 TABLET BY MOUTH  TWICE DAILY   hydrochlorothiazide (HYDRODIURIL) 25 MG tablet TAKE 1 TABLET BY MOUTH  DAILY   levothyroxine (SYNTHROID) 50 MCG tablet TAKE 1 TABLET BY MOUTH  DAILY   potassium chloride (KLOR-CON) 10 MEQ tablet TAKE 1 TABLET BY MOUTH  TWICE DAILY   sertraline (ZOLOFT) 50 MG tablet TAKE 1 TABLET BY MOUTH  DAILY   No facility-administered encounter medications on file as of 11/20/2021.     Lab Results  Component Value Date   WBC 8.3 05/28/2021   HGB 14.8 05/28/2021   HCT 43.6 05/28/2021   PLT 224.0 05/28/2021   GLUCOSE 106 (H) 10/11/2021   CHOL 170 10/11/2021   TRIG 196.0 (H) 10/11/2021   HDL 41.00 10/11/2021   LDLDIRECT 97.0 11/01/2018   LDLCALC 90 10/11/2021   ALT 14 10/11/2021   AST 15 10/11/2021   NA 141  10/11/2021   K 3.6 10/11/2021   CL 103 10/11/2021   CREATININE 0.80 10/11/2021   BUN 16 10/11/2021   CO2 27 10/11/2021   TSH 3.22 02/15/2021   HGBA1C 6.0 10/11/2021   MICROALBUR <0.7 06/11/2015    MM 3D SCREEN BREAST BILATERAL  Result Date: 03/29/2021 CLINICAL DATA:  Screening. EXAM: DIGITAL SCREENING BILATERAL MAMMOGRAM WITH TOMOSYNTHESIS AND CAD TECHNIQUE: Bilateral screening digital craniocaudal and mediolateral oblique mammograms were obtained. Bilateral screening digital breast tomosynthesis was performed. The images were evaluated with computer-aided detection. COMPARISON:  Previous exam(s). ACR Breast Density Category b: There are scattered areas of fibroglandular density. FINDINGS: There are no findings suspicious for malignancy. IMPRESSION: No mammographic evidence of malignancy. A result letter of this screening mammogram will be mailed directly to the patient. RECOMMENDATION: Screening mammogram in one year. (Code:SM-B-01Y) BI-RADS CATEGORY  1: Negative. Electronically Signed   By: JMargarette CanadaM.D.   On: 03/29/2021 16:04      Assessment & Plan:   Problem List Items Addressed This Visit     Acquired thrombophilia ([place]    With afib.  On eliquis.  Follow.       [place]rial [place]    Continues on eliquis.  Rated controlled.  She is asymptomatic.  Had issues with bradycardia previously on toprol and flecainide.  No chest pain.  Breathing stable. Rides her bike with no cardiac symptoms reported. Discuss with cardiology regarding pre/peri op recommendations.        Bunion  Saw podiatry - initial concern -bunion. S/p injection. Helped for 2 weeks.  Reevaluated 10/29/21 - plan for left great toe arthroplasty with implant - 12/05/21.        Hyperglycemia    Low carb diet and exercise.  Follow met b and a1c.       Hyperlipidemia    On lipitor.  Low cholesterol diet and exercise.  Follow lipid panel and liver function tests.        Hypertension    Blood pressure as  outlined.  On hctz.  Follow pressures.  Follow metabolic panel.        Hypothyroidism    On thyroid replacement.  Follow tsh.       Pre-op evaluation - Primary    Saw podiatry - initial concern -bunion. S/p injection. Helped for 2 weeks.  Reevaluated 10/29/21 - plan for left great toe arthroplasty with implant - 12/05/21.  Here for pre op.  EKG - Afib with ventricular rate 81.  No chest pain or sob with increased activity or exertion.  On eliquis.  Will contact cardiology regarding pre op and peri op recommendations.   Addendum:  Reviewed EKG.  Given no symptoms with increased activity or exertion - felt to be at low risk to proceed with planned surgery.  Pt was advised to stop eliquis 3 days prior to surgery, with plans to resume after surgery - per podiatry.  Will need close intra op and post op monitoring of heart rate and blood pressure to avoid extremes.  Pt aware of above.  Aware of risk of stopping eliquis and agreeable to proceed.        Relevant Orders   EKG 12-Lead (Completed)   Other Visit Diagnoses     Need for immunization against influenza       Relevant Orders   Flu Vaccine QUAD High Dose(Fluad) (Completed)        Einar Pheasant, MD

## 2021-11-25 ENCOUNTER — Encounter: Payer: Self-pay | Admitting: Internal Medicine

## 2021-11-25 NOTE — Assessment & Plan Note (Signed)
Blood pressure as outlined.  On hctz.  Follow pressures.  Follow metabolic panel.   

## 2021-11-25 NOTE — Assessment & Plan Note (Signed)
Continues on eliquis.  Rated controlled.  She is asymptomatic.  Had issues with bradycardia previously on toprol and flecainide.  No chest pain.  Breathing stable. Rides her bike with no cardiac symptoms reported. Discuss with cardiology regarding pre/peri op recommendations.

## 2021-11-25 NOTE — Assessment & Plan Note (Signed)
On thyroid replacement.  Follow tsh.  

## 2021-11-25 NOTE — Assessment & Plan Note (Signed)
Low carb diet and exercise.  Follow met b and a1c.  

## 2021-11-25 NOTE — Assessment & Plan Note (Signed)
On lipitor.  Low cholesterol diet and exercise.  Follow lipid panel and liver function tests.   

## 2021-11-25 NOTE — Assessment & Plan Note (Addendum)
Saw podiatry - initial concern -bunion. S/p injection. Helped for 2 weeks.  Reevaluated 10/29/21 - plan for left great toe arthroplasty with implant - 12/05/21.  Here for pre op.  EKG - Afib with ventricular rate 81.  No chest pain or sob with increased activity or exertion.  On eliquis.  Will contact cardiology regarding pre op and peri op recommendations.   Addendum:  Reviewed EKG.  Given no symptoms with increased activity or exertion - felt to be at low risk to proceed with planned surgery.  Pt was advised to stop eliquis 3 days prior to surgery, with plans to resume after surgery - per podiatry.  Will need close intra op and post op monitoring of heart rate and blood pressure to avoid extremes.  Pt aware of above.  Aware of risk of stopping eliquis and agreeable to proceed.

## 2021-11-25 NOTE — Assessment & Plan Note (Signed)
Saw podiatry - initial concern -bunion. S/p injection. Helped for 2 weeks.  Reevaluated 10/29/21 - plan for left great toe arthroplasty with implant - 12/05/21.

## 2021-11-25 NOTE — Assessment & Plan Note (Signed)
With afib.  On eliquis.  Follow.  

## 2021-11-26 ENCOUNTER — Ambulatory Visit: Payer: Medicare Other | Admitting: Internal Medicine

## 2021-11-27 ENCOUNTER — Telehealth: Payer: Self-pay

## 2021-11-27 ENCOUNTER — Telehealth: Payer: Self-pay | Admitting: Internal Medicine

## 2021-11-27 NOTE — Telephone Encounter (Signed)
Patient states she is having surgery on 12/05/2021.  Patient states Shelly, Surgery Scheduler in Dr. Daylene Katayama' office - [phone], has been trying to contact us.  Patient states Darrick Penna needs approval from Dr. Einar Pheasant for patient to discontinue ELIQUIS 5 MG TABS tablet prior to surgery.  Patient states Darrick Penna may have other questions as well.  Patient states surgery is one week from tomorrow, so she would like to know when she needs to stop taking this medication.

## 2021-11-27 NOTE — Telephone Encounter (Signed)
Darrick Penna stated she already spoke to you

## 2021-11-27 NOTE — Telephone Encounter (Signed)
Shelly from [facility] called stating pt is having surgery next week and they need clearance from the cardiologist. Darrick Penna want to know if the provider has reached out the cardiologist yet or do they need to

## 2021-11-28 NOTE — Telephone Encounter (Signed)
I have called and spoke to Alleene and Ms Suares.

## 2021-11-30 NOTE — Telephone Encounter (Signed)
I have added an addendum to her office note.  I have talked to Bonnie Shaw and advised to stop eliquis 3 days prior to planned surgery.  Please confirm podiatry aware and may need to fax office note to them.  Let me know if any problems.

## 2021-12-04 ENCOUNTER — Other Ambulatory Visit: Payer: Self-pay | Admitting: Internal Medicine

## 2021-12-04 DIAGNOSIS — R339 Retention of urine, unspecified: Secondary | ICD-10-CM

## 2021-12-04 DIAGNOSIS — R350 Frequency of micturition: Secondary | ICD-10-CM

## 2021-12-04 NOTE — Telephone Encounter (Signed)
Lm on Shelly vm advising same - asked for cb if anything further needed

## 2021-12-04 NOTE — Progress Notes (Signed)
Order placed for referral to urology.

## 2021-12-05 ENCOUNTER — Other Ambulatory Visit: Payer: Self-pay | Admitting: Podiatry

## 2021-12-05 DIAGNOSIS — M2012 Hallux valgus (acquired), left foot: Secondary | ICD-10-CM

## 2021-12-05 MED ORDER — HYDROCODONE-ACETAMINOPHEN 10-325 MG PO TABS
1.0000 | ORAL_TABLET | ORAL | 0 refills | Status: AC | PRN
Start: 2021-12-05 — End: 2021-12-10

## 2021-12-05 NOTE — Progress Notes (Signed)
PRN postop 

## 2021-12-12 ENCOUNTER — Other Ambulatory Visit: Payer: Self-pay | Admitting: Internal Medicine

## 2021-12-13 ENCOUNTER — Encounter: Payer: Self-pay | Admitting: Podiatry

## 2021-12-13 ENCOUNTER — Ambulatory Visit (INDEPENDENT_AMBULATORY_CARE_PROVIDER_SITE_OTHER): Payer: Medicare Other | Admitting: Podiatry

## 2021-12-13 ENCOUNTER — Ambulatory Visit (INDEPENDENT_AMBULATORY_CARE_PROVIDER_SITE_OTHER): Payer: Medicare Other

## 2021-12-13 DIAGNOSIS — Z9889 Other specified postprocedural states: Secondary | ICD-10-CM

## 2021-12-13 MED ORDER — DOXYCYCLINE HYCLATE 100 MG PO TABS: 100.0000 mg | ORAL_TABLET | Freq: Two times a day (BID) | ORAL | 0 refills | Status: DC

## 2021-12-13 NOTE — [facility] Notes (Addendum)
Chief Complaint  Patient presents with   Routine Post Op    "It hurts."    Subjective:  Patient presents today status post left great toe arthroplasty with implant. DOS: 12/05/2021.  Patient states that she is doing well.  She does have some pain and tenderness associated to the foot.  She is weightbearing as tolerated in the postsurgical boot.  Past Medical History:  Diagnosis Date   Anxiety    Arthritis    Cataract    Complication of anesthesia    Woke up during colonoscopy and during Knee surgery. Also says "nitrous makes me crazy"   Family history of adverse reaction to anesthesia    son woke up during TEE   Guillain-Barre ([place]) 2018   recovered   Hyperlipidemia    Hypertension    Hyperthyroidism    s/p RAI therapy   Hypothyroidism    s/p ablation   Left atrial enlargement    severely enlarged   Migraine headache    2x/yr   Motion sickness    roller coasters   Obesity    Panic disorder    Persistent atrial fibrillation ([place])    Snoring     Past Surgical History:  Procedure Laterality Date   ABDOMINAL HYSTERECTOMY     CARDIOVERSION N/A 09/11/2017   Procedure: CARDIOVERSION;  Surgeon: Skeet Latch, MD;  Location: [facility];  Service: Cardiovascular;  Laterality: N/A;   CATARACT EXTRACTION     COLONOSCOPY WITH PROPOFOL N/A 03/11/2019   Procedure: COLONOSCOPY WITH PROPOFOL;  Surgeon: Jonathon Bellows, MD;  Location: [facility];  Service: Endoscopy;  Laterality: N/A;  colon   KNEE SURGERY Bilateral    replacements   POLYPECTOMY  03/11/2019   Procedure: POLYPECTOMY;  Surgeon: Jonathon Bellows, MD;  Location: [facility];  Service: Endoscopy;;   THYROID ABLATION  2010    Allergies  Allergen Reactions   Ativan [Lorazepam] Other (See Comments)    High sensitivity, cause pt to pass out   Meloxicam Other (See Comments)    Hallucinations   Sulfonamide Derivatives Rash    Objective/Physical Exam Neurovascular status intact.  Skin incisions appear to  be well coapted with sutures intact. No sign of infectious process noted. No dehiscence. No active bleeding noted.  There are some scant serous drainage along the incision site and swelling throughout the foot.  Concern for possible low-grade postoperative infection  Radiographic Exam LT foot 12/13/2021:  Good alignment of the first ray.  Arthroplasty and implant appears stable and intact.  Overall well-healing surgical foot.  Assessment: 1. s/p left great toe arthroplasty with implant. DOS: 12/05/2021   Plan of Care:  1. Patient was evaluated. X-rays reviewed 2.  Dressings changed.  Clean dry and intact x1 week 3.  Cam boot is very uncomfortable for the patient.  Postsurgical shoe dispensed.  Continue minimal WBAT with the assistance of a walker or cane 4.  Prescription for doxycycline 100 mg 2 times daily #20  5.  Return to clinic in 1 week   Edrick Kins, DPM [facility]  Dr. Edrick Kins, DPM    [address] [place] [address]                Office [phone]  Fax [phone]  [phone]      

## 2021-12-13 NOTE — Addendum Note (Signed)
Addended by: Edrick Kins on: 12/13/2021 12:22 PM   Modules accepted: Orders

## 2021-12-20 ENCOUNTER — Ambulatory Visit (INDEPENDENT_AMBULATORY_CARE_PROVIDER_SITE_OTHER): Payer: Medicare Other | Admitting: Podiatry

## 2021-12-20 DIAGNOSIS — Z9889 Other specified postprocedural states: Secondary | ICD-10-CM

## 2021-12-20 NOTE — [facility] Notes (Signed)
Chief Complaint  Patient presents with   Post-op Follow-up    Patient is here for #2 post op follow-up  left foot great toe.    Subjective:  Patient presents today status post left great toe arthroplasty with implant. DOS: 12/05/2021.  Patient doing well.  She says there is significant improvement from last week to this week.  She is minimally weightbearing in the surgical shoe.  No new complaints at this time  Past Medical History:  Diagnosis Date   Anxiety    Arthritis    Cataract    Complication of anesthesia    Woke up during colonoscopy and during Knee surgery. Also says "nitrous makes me crazy"   Family history of adverse reaction to anesthesia    son woke up during TEE   Guillain-Barre ([place]) 2018   recovered   Hyperlipidemia    Hypertension    Hyperthyroidism    s/p RAI therapy   Hypothyroidism    s/p ablation   Left atrial enlargement    severely enlarged   Migraine headache    2x/yr   Motion sickness    roller coasters   Obesity    Panic disorder    Persistent atrial fibrillation ([place])    Snoring     Past Surgical History:  Procedure Laterality Date   ABDOMINAL HYSTERECTOMY     CARDIOVERSION N/A 09/11/2017   Procedure: CARDIOVERSION;  Surgeon: Skeet Latch, MD;  Location: [facility];  Service: Cardiovascular;  Laterality: N/A;   CATARACT EXTRACTION     COLONOSCOPY WITH PROPOFOL N/A 03/11/2019   Procedure: COLONOSCOPY WITH PROPOFOL;  Surgeon: Jonathon Bellows, MD;  Location: [facility];  Service: Endoscopy;  Laterality: N/A;  colon   KNEE SURGERY Bilateral    replacements   POLYPECTOMY  03/11/2019   Procedure: POLYPECTOMY;  Surgeon: Jonathon Bellows, MD;  Location: [facility];  Service: Endoscopy;;   THYROID ABLATION  2010    Allergies  Allergen Reactions   Ativan [Lorazepam] Other (See Comments)    High sensitivity, cause pt to pass out   Meloxicam Other (See Comments)    Hallucinations   Sulfonamide Derivatives Rash     Objective/Physical Exam Neurovascular status intact.  Skin incisions appear to be well coapted with sutures intact. No sign of infectious process noted. No dehiscence. No active bleeding noted.  No drainage.  Clinically overall well-healing surgical foot  Radiographic Exam LT foot 12/13/2021:  Good alignment of the first ray.  Arthroplasty and implant appears stable and intact.  Overall well-healing surgical foot.  Assessment: 1. s/p left great toe arthroplasty with implant. DOS: 12/05/2021   Plan of Care:  1. Patient was evaluated.  2.  Patient states that the antibiotics were irritating her stomach.  She discontinued today 3.  Overall the foot appears much better.  Patient may begin washing and showering getting the foot wet 4.  Sutures were left intact.  We will plan to remove sutures next visit 5.  Continue weightbearing as tolerated in the postsurgical shoe 6.  Return to clinic in 1 week for suture removal   Edrick Kins, DPM [facility]  Dr. Edrick Kins, DPM    [address], [place] [address]  Office [phone]  Fax [phone]

## 2021-12-28 ENCOUNTER — Other Ambulatory Visit: Payer: Self-pay | Admitting: Internal Medicine

## 2021-12-31 ENCOUNTER — Encounter: Payer: Self-pay | Admitting: Urology

## 2021-12-31 ENCOUNTER — Encounter: Payer: Self-pay | Admitting: Internal Medicine

## 2021-12-31 ENCOUNTER — Ambulatory Visit (INDEPENDENT_AMBULATORY_CARE_PROVIDER_SITE_OTHER): Payer: Medicare Other | Admitting: Urology

## 2021-12-31 VITALS — BP 167/92 | HR 118 | Ht 63.0 in | Wt 227.0 lb

## 2021-12-31 DIAGNOSIS — N3946 Mixed incontinence: Secondary | ICD-10-CM

## 2021-12-31 LAB — MICROSCOPIC EXAMINATION: Epithelial Cells (non renal): >10 /hpf — AB (ref 0–10)

## 2021-12-31 LAB — URINALYSIS, COMPLETE
Bilirubin, UA: NEGATIVE
Glucose, UA: NEGATIVE
Leukocytes,UA: NEGATIVE
Nitrite, UA: NEGATIVE
RBC, UA: NEGATIVE
Specific Gravity, UA: 1.03 (ref 1.005–1.030)
Urobilinogen, Ur: 0.2 mg/dL (ref 0.2–1.0)
pH, UA: 5.5 (ref 5.0–7.5)

## 2021-12-31 NOTE — Patient Instructions (Signed)
The Female Pelvic Floor  The pelvic floor consists of several layers of muscles that cover the bottom of the pelvic cavity. These muscles have several distinct roles:  To support the pelvic organs, the bladder, uterus and colon within the pelvis. To assist in stopping and starting the flow of urine or the passage of gas or stool.To aid in sexual appreciation.  How to Locate the Pelvic Floor Muscles  The Urine Stop Test At the midstream of your urine flow, squeeze the pelvic floor muscles. You should feel the sensation of the openings close and the muscles pulling up and into the pelvic cavity.  If you have strong muscles you will slow or stop the stream of urine. Stop or slow the flow of urine without tensing the muscles of your legs, buttocks. Do this only to locate the muscles, not as a daily exercise. Feeling the Muscle Place a fingertip on the anal opening. Contract and lift the muscles as though you are holding back gas or stool. You will feel your anal opening tighten. Insert 1 or 2 fingers into the vagina to feel the contraction and lifting of the muscles. You should feel the opening of the vagina tighten around your finger. Watching the Muscle Contract Begin by lying on a flat surface.  Position yourself with your knees apart and bent with your head elevated and supported on several pillows. Use a mirror to look at the anal and vaginal openings and the perineal body (the area between the two openings).  Contract or tighten the muscles around the openings and watch for a lifting of the perineal body and closure of the openings.   If you see a bulge or feel tissues coming out of your openings, this is an incorrect contraction and you should notify your health care provider for more instructions.  2007, [employer] Doc.11
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
## 2022-01-03 ENCOUNTER — Ambulatory Visit (INDEPENDENT_AMBULATORY_CARE_PROVIDER_SITE_OTHER): Payer: Medicare Other | Admitting: Podiatry

## 2022-01-03 ENCOUNTER — Ambulatory Visit (INDEPENDENT_AMBULATORY_CARE_PROVIDER_SITE_OTHER): Payer: Medicare Other

## 2022-01-03 DIAGNOSIS — Z9889 Other specified postprocedural states: Secondary | ICD-10-CM | POA: Diagnosis not present

## 2022-01-03 NOTE — [facility] Notes (Signed)
Chief Complaint  Patient presents with   Post-op Follow-up    Post op left foot # 3 DOS 12/05/21 LT GREAT TOE ARTHROPLASTY W/IMPLANT    Subjective:  Patient presents today status post left great toe arthroplasty with implant. DOS: 12/05/2021.  Patient doing well.  Patient is now in regular shoes.  Overall she is doing very well.  No new complaints at this time  Past Medical History:  Diagnosis Date   Anxiety    Arthritis    Cataract    Complication of anesthesia    Woke up during colonoscopy and during Knee surgery. Also says "nitrous makes me crazy"   Family history of adverse reaction to anesthesia    son woke up during TEE   Guillain-Barre ([place]) 2018   recovered   Hyperlipidemia    Hypertension    Hyperthyroidism    s/p RAI therapy   Hypothyroidism    s/p ablation   Left atrial enlargement    severely enlarged   Migraine headache    2x/yr   Motion sickness    roller coasters   Obesity    Panic disorder    Persistent atrial fibrillation ([place])    Snoring     Past Surgical History:  Procedure Laterality Date   ABDOMINAL HYSTERECTOMY     CARDIOVERSION N/A 09/11/2017   Procedure: CARDIOVERSION;  Surgeon: Skeet Latch, MD;  Location: [facility];  Service: Cardiovascular;  Laterality: N/A;   CATARACT EXTRACTION     COLONOSCOPY WITH PROPOFOL N/A 03/11/2019   Procedure: COLONOSCOPY WITH PROPOFOL;  Surgeon: Jonathon Bellows, MD;  Location: [facility];  Service: Endoscopy;  Laterality: N/A;  colon   KNEE SURGERY Bilateral    replacements   POLYPECTOMY  03/11/2019   Procedure: POLYPECTOMY;  Surgeon: Jonathon Bellows, MD;  Location: [facility];  Service: Endoscopy;;   THYROID ABLATION  2010    Allergies  Allergen Reactions   Ativan [Lorazepam] Other (See Comments)    High sensitivity, cause pt to pass out   Meloxicam Other (See Comments)    Hallucinations   Trimethoprim Other (See Comments)   Sulfamethoxazole Rash   Sulfonamide Derivatives Rash     Objective/Physical Exam Neurovascular status intact.  Skin incision nicely healed with exception of a small area to the proximal portion of the incision site which appears very superficial and stable with no drainage.  Clinically there is no indication of infection.  There continues to be some mild edema to the area  Radiographic Exam LT foot 01/03/2022:  Good alignment of the first ray.  Implant appears stable however there is some space between the osteotomy site of the first metatarsal and implant approximately 2 mm.  Will observe for now.  I do not suspect this will create any issues for the patient.   Assessment: 1. s/p left great toe arthroplasty with implant. DOS: 12/05/2021   Plan of Care:  1. Patient was evaluated.  2.  Sutures were removed 12/27/2021 by her son-in-law, in the medical field and works at [facility] wound care center 3.  Recommend antibiotic ointment and a light dressing to the incision site.  Overall the incision site looks very well and the small areas that have not healed appear to be very superficial and stable with no drainage. 4.  Patient may completely discontinue postsurgical shoe.  Patient may now resume regular tennis shoes and sneakers 5.  Return to clinic 6 weeks for follow-up x-ray   Edrick Kins, DPM [facility]  Dr. Edrick Kins, DPM    [address], [place] [address]                Office [phone]  Fax [phone]

## 2022-01-13 ENCOUNTER — Ambulatory Visit: Payer: Medicare Other | Admitting: Internal Medicine

## 2022-01-15 ENCOUNTER — Other Ambulatory Visit: Payer: Self-pay

## 2022-01-15 ENCOUNTER — Emergency Department (HOSPITAL_COMMUNITY)
Admission: EM | Admit: 2022-01-15 | Discharge: 2022-01-15 | Discharge status: Against Medical Advice | Payer: Medicare Other | Attending: Emergency Medicine | Admitting: Emergency Medicine

## 2022-01-15 ENCOUNTER — Encounter (HOSPITAL_COMMUNITY): Payer: Self-pay

## 2022-01-15 ENCOUNTER — Emergency Department (HOSPITAL_COMMUNITY): Payer: Medicare Other

## 2022-01-15 DIAGNOSIS — S0990XA Unspecified injury of head, initial encounter: Secondary | ICD-10-CM | POA: Diagnosis present

## 2022-01-15 DIAGNOSIS — I951 Orthostatic hypotension: Secondary | ICD-10-CM

## 2022-01-15 DIAGNOSIS — I4891 Unspecified atrial fibrillation: Secondary | ICD-10-CM

## 2022-01-15 DIAGNOSIS — Z7901 Long term (current) use of anticoagulants: Secondary | ICD-10-CM | POA: Insufficient documentation

## 2022-01-15 DIAGNOSIS — Z79899 Other long term (current) drug therapy: Secondary | ICD-10-CM | POA: Diagnosis not present

## 2022-01-15 DIAGNOSIS — Y92009 Unspecified place in unspecified non-institutional (private) residence as the place of occurrence of the external cause: Secondary | ICD-10-CM | POA: Diagnosis not present

## 2022-01-15 DIAGNOSIS — Z23 Encounter for immunization: Secondary | ICD-10-CM | POA: Diagnosis not present

## 2022-01-15 DIAGNOSIS — S0003XA Contusion of scalp, initial encounter: Secondary | ICD-10-CM | POA: Diagnosis not present

## 2022-01-15 DIAGNOSIS — W19XXXA Unspecified fall, initial encounter: Secondary | ICD-10-CM

## 2022-01-15 DIAGNOSIS — W01198A Fall on same level from slipping, tripping and stumbling with subsequent striking against other object, initial encounter: Secondary | ICD-10-CM | POA: Insufficient documentation

## 2022-01-15 DIAGNOSIS — S0101XA Laceration without foreign body of scalp, initial encounter: Secondary | ICD-10-CM

## 2022-01-15 LAB — SAMPLE TO BLOOD BANK

## 2022-01-15 LAB — PROTIME-INR
INR: 1.2 (ref 0.8–1.2)
Prothrombin Time: 14.6 seconds (ref 11.4–15.2)

## 2022-01-15 LAB — HEMOGLOBIN AND HEMATOCRIT, BLOOD
HCT: 38.3 % (ref 36.0–46.0)
Hemoglobin: 13.6 g/dL (ref 12.0–15.0)

## 2022-01-15 LAB — COMPREHENSIVE METABOLIC PANEL
ALT: 14 U/L (ref 0–44)
AST: 22 U/L (ref 15–41)
Albumin: 3.8 g/dL (ref 3.5–5.0)
Alkaline Phosphatase: 57 U/L (ref 38–126)
Anion gap: 12 (ref 5–15)
BUN: 13 mg/dL (ref 8–23)
CO2: 24 mmol/L (ref 22–32)
Calcium: 9.3 mg/dL (ref 8.9–10.3)
Chloride: 105 mmol/L (ref 98–111)
Creatinine, Ser: 0.85 mg/dL (ref 0.44–1.00)
GFR, Estimated: >60 mL/min (ref >60)
Glucose, Bld: 183 mg/dL — ABNORMAL HIGH (ref 70–99)
Potassium: 3.3 mmol/L — ABNORMAL LOW (ref 3.5–5.1)
Sodium: 141 mmol/L (ref 135–145)
Total Bilirubin: 0.9 mg/dL (ref 0.3–1.2)
Total Protein: 6.7 g/dL (ref 6.5–8.1)

## 2022-01-15 LAB — I-STAT CHEM 8, ED
BUN: 13 mg/dL (ref 8–23)
Calcium, Ion: 1.16 mmol/L (ref 1.15–1.40)
Chloride: 101 mmol/L (ref 98–111)
Creatinine, Ser: 0.7 mg/dL (ref 0.44–1.00)
Glucose, Bld: 173 mg/dL — ABNORMAL HIGH (ref 70–99)
HCT: 37 % (ref 36.0–46.0)
Hemoglobin: 12.6 g/dL (ref 12.0–15.0)
Potassium: 3.3 mmol/L — ABNORMAL LOW (ref 3.5–5.1)
Sodium: 139 mmol/L (ref 135–145)
TCO2: 24 mmol/L (ref 22–32)

## 2022-01-15 LAB — CBC
HCT: 42.3 % (ref 36.0–46.0)
Hemoglobin: 14.7 g/dL (ref 12.0–15.0)
MCH: 32.4 pg (ref 26.0–34.0)
MCHC: 34.8 g/dL (ref 30.0–36.0)
MCV: 93.2 fL (ref 80.0–100.0)
Platelets: 229 10*3/uL (ref 150–400)
RBC: 4.54 MIL/uL (ref 3.87–5.11)
RDW: 12.5 % (ref 11.5–15.5)
WBC: 9.2 10*3/uL (ref 4.0–10.5)
nRBC: 0 % (ref 0.0–0.2)

## 2022-01-15 LAB — ETHANOL: Alcohol, Ethyl (B): <10 mg/dL (ref <10)

## 2022-01-15 LAB — MAGNESIUM: Magnesium: 1.8 mg/dL (ref 1.7–2.4)

## 2022-01-15 LAB — LACTIC ACID, PLASMA: Lactic Acid, Venous: 3.1 mmol/L (ref 0.5–1.9)

## 2022-01-15 MED ORDER — LIDOCAINE-EPINEPHRINE (PF) 2 %-1:200000 IJ SOLN
20.0000 mL | Freq: Once | INTRAMUSCULAR | Status: AC
  Administered 2022-01-15: 20 mL via INTRADERMAL
  Filled 2022-01-15: qty 20

## 2022-01-15 MED ORDER — TETANUS-DIPHTH-ACELL PERTUSSIS 5-2.5-18.5 LF-MCG/0.5 IM SUSY
0.5000 mL | PREFILLED_SYRINGE | Freq: Once | INTRAMUSCULAR | Status: AC
  Administered 2022-01-15: 0.5 mL via INTRAMUSCULAR
  Filled 2022-01-15: qty 0.5

## 2022-01-15 MED ORDER — POTASSIUM CHLORIDE CRYS ER 20 MEQ PO TBCR
40.0000 meq | EXTENDED_RELEASE_TABLET | Freq: Once | ORAL | Status: AC
  Administered 2022-01-15: 40 meq via ORAL
  Filled 2022-01-15: qty 2

## 2022-01-15 MED ORDER — IOHEXOL 350 MG/ML SOLN
75.0000 mL | Freq: Once | INTRAVENOUS | Status: AC | PRN
  Administered 2022-01-15: 75 mL via INTRAVENOUS

## 2022-01-15 MED ORDER — METOPROLOL TARTRATE 5 MG/5ML IV SOLN
5.0000 mg | Freq: Once | INTRAVENOUS | Status: AC
  Administered 2022-01-15: 5 mg via INTRAVENOUS
  Filled 2022-01-15: qty 5

## 2022-01-15 MED ORDER — LORAZEPAM 2 MG/ML IJ SOLN
INTRAMUSCULAR | Status: AC
  Administered 2022-01-15: 1 mg
  Filled 2022-01-15: qty 1

## 2022-01-15 NOTE — ED Notes (Signed)
Pt requesting to leave AMA, EDP notified.

## 2022-01-15 NOTE — ED Notes (Signed)
Patient tolerated ambulation well. No complaints of dizziness. Provider notified.

## 2022-01-15 NOTE — Consult Note (Signed)
CC/Reason for consult: Fall on blood thinner, scalp lac and hematoma  Requesting physician: Regan Lemming MD  HPI: Bonnie Shaw is an 71 y.o. female hx HTN, afib (on Eliquis), HLD, hypothyroidism presented to [facility] following fall. Sustained scalp lac and hematoma. Staples were placed on arrival by EDP and pressure dressing applied. Has since stopped.    Past Medical History:  Diagnosis Date   Anxiety    Arthritis    Cataract    Complication of anesthesia    Woke up during colonoscopy and during Knee surgery. 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   Hyperlipidemia    Hypertension    Hyperthyroidism    s/p RAI therapy   Hypothyroidism    s/p ablation   Left atrial enlargement    severely enlarged   Migraine headache    2x/yr   Motion sickness    roller coasters   Obesity    Panic disorder    Persistent atrial fibrillation ([place])    Snoring     Past Surgical History:  Procedure Laterality Date   ABDOMINAL HYSTERECTOMY     CARDIOVERSION N/A 09/11/2017   Procedure: CARDIOVERSION;  Surgeon: Skeet Latch, MD;  Location: [facility];  Service: Cardiovascular;  Laterality: N/A;   CATARACT EXTRACTION     COLONOSCOPY WITH PROPOFOL N/A 03/11/2019   Procedure: COLONOSCOPY WITH PROPOFOL;  Surgeon: Jonathon Bellows, MD;  Location: [facility];  Service: Endoscopy;  Laterality: N/A;  colon   KNEE SURGERY Bilateral    replacements   POLYPECTOMY  03/11/2019   Procedure: POLYPECTOMY;  Surgeon: Jonathon Bellows, MD;  Location: [facility];  Service: Endoscopy;;   THYROID ABLATION  2010    Family History  Problem Relation Age of Onset   Alzheimer's disease Father    Stroke Mother    Cancer Sister        ovarian    Ovarian cancer Sister 62   Breast cancer Neg Hx     Social:  reports that she has never smoked. She has never used smokeless tobacco. She  reports that she does not drink alcohol and does not use drugs.  Allergies:  Allergies  Allergen Reactions   Ativan [Lorazepam] Other (See Comments)    High sensitivity, cause pt to pass out   Meloxicam Other (See Comments)    Hallucinations   Trimethoprim Other (See Comments)   Sulfamethoxazole Rash   Sulfonamide Derivatives Rash    Medications: I have reviewed the patient's current medications.  Results for orders placed or performed during the hospital encounter of 01/15/22 (from the past 48 hour(s))  Comprehensive metabolic panel     Status: Abnormal   Collection Time: 01/15/22  4:35 PM  Result Value Ref Range   Sodium 141 135 - 145 mmol/L   Potassium 3.3 (L) 3.5 - 5.1 mmol/L   Chloride 105 98 - 111 mmol/L   CO2 24 22 - 32 mmol/L   Glucose, Bld 183 (H) 70 - 99 mg/dL    Comment: Glucose reference range applies only to samples taken after fasting for at least 8 hours.   BUN 13 8 - 23 mg/dL   Creatinine, Ser 0.85 0.44 - 1.00 mg/dL   Calcium 9.3 8.9 - 10.3 mg/dL   Total Protein 6.7 6.5 - 8.1 g/dL   Albumin 3.8 3.5 - 5.0 g/dL   AST  22 15 - 41 U/L   ALT 14 0 - 44 U/L   Alkaline Phosphatase 57 38 - 126 U/L   Total Bilirubin 0.9 0.3 - 1.2 mg/dL   GFR, Estimated >60 >60 mL/min    Comment: (NOTE) Calculated using the CKD-EPI Creatinine Equation (2021)    Anion gap 12 5 - 15    Comment: Performed at [facility] [address] [place] [address]  CBC     Status: None   Collection Time: 01/15/22  4:35 PM  Result Value Ref Range   WBC 9.2 4.0 - 10.5 K/uL   RBC 4.54 3.87 - 5.11 MIL/uL   Hemoglobin 14.7 12.0 - 15.0 g/dL   HCT 42.3 36.0 - 46.0 %   MCV 93.2 80.0 - 100.0 fL   MCH 32.4 26.0 - 34.0 pg   MCHC 34.8 30.0 - 36.0 g/dL   RDW 12.5 11.5 - 15.5 %   Platelets 229 150 - 400 K/uL   nRBC 0.0 0.0 - 0.2 %    Comment: Performed at [facility], [place] [address] [place], [place] [address]  Ethanol     Status: None   Collection Time: 01/15/22  4:35 PM   Result Value Ref Range   Alcohol, Ethyl (B) <10 <10 mg/dL    Comment: (NOTE) Lowest detectable limit for serum alcohol is 10 mg/dL.  For medical purposes only. Performed at [facility], [place] [address] [place], [place] [address]   Protime-INR     Status: None   Collection Time: 01/15/22  4:35 PM  Result Value Ref Range   Prothrombin Time 14.6 11.4 - 15.2 seconds   INR 1.2 0.8 - 1.2    Comment: (NOTE) INR goal varies based on device and disease states. Performed at [facility], [place] [address] [place], [place] [address]   Magnesium     Status: None   Collection Time: 01/15/22  4:35 PM  Result Value Ref Range   Magnesium 1.8 1.7 - 2.4 mg/dL    Comment: Performed at [facility], [place] [address], [place], [place] [address]  Lactic acid, plasma     Status: Abnormal   Collection Time: 01/15/22  4:44 PM  Result Value Ref Range   Lactic Acid, Venous 3.1 (HH) 0.5 - 1.9 mmol/L    Comment: CRITICAL RESULT CALLED TO, READ BACK BY AND VERIFIED WITH J.JACKSON,RN '@[id number]'$  01/15/2022 VANG.J Performed at [facility], [place] [address], [place], [place] [address]   I-Stat Chem 8, ED     Status: Abnormal   Collection Time: 01/15/22  5:52 PM  Result Value Ref Range   Sodium 139 135 - 145 mmol/L   Potassium 3.3 (L) 3.5 - 5.1 mmol/L   Chloride 101 98 - 111 mmol/L   BUN 13 8 - 23 mg/dL   Creatinine, Ser 0.70 0.44 - 1.00 mg/dL   Glucose, Bld 173 (H) 70 - 99 mg/dL    Comment: Glucose reference range applies only to samples taken after fasting for at least 8 hours.   Calcium, Ion 1.16 1.15 - 1.40 mmol/L   TCO2 24 22 - 32 mmol/L   Hemoglobin 12.6 12.0 - 15.0 g/dL   HCT 37.0 36.0 - 46.0 %  Sample to Blood Bank     Status: None   Collection Time: 01/15/22  6:03 PM  Result Value Ref Range   Blood Bank Specimen SAMPLE AVAILABLE FOR TESTING    Sample Expiration  01/16/2022,2359 Performed at [facility] [address] [place], [place] [address]     CT ANGIO  NECK W OR WO CONTRAST  Result Date: 01/15/2022 CLINICAL DATA:  Fall, neck trauma. EXAM: CT ANGIOGRAPHY NECK TECHNIQUE: Multidetector CT imaging of the neck was performed using the standard protocol during bolus administration of intravenous contrast. Multiplanar CT image reconstructions and MIPs were obtained to evaluate the vascular anatomy. Carotid stenosis measurements (when applicable) are obtained utilizing NASCET criteria, using the distal internal carotid diameter as the denominator. RADIATION DOSE REDUCTION: This exam was performed according to the departmental dose-optimization program which includes automated exposure control, adjustment of the mA and/or kV according to patient size and/or use of iterative reconstruction technique. CONTRAST:  3m OMNIPAQUE IOHEXOL 350 MG/ML SOLN COMPARISON:  None Available. FINDINGS: Aortic arch: The imaged aortic arch is unremarkable. The origins of the major branch vessels are patent. The subclavian arteries are patent to the level imaged. Right carotid system: The right common, internal, and external carotid arteries are patent, without hemodynamically significant stenosis or occlusion. There is no dissection or aneurysm. Left carotid system: The left common, internal, and external carotid arteries are patent, without hemodynamically significant stenosis or occlusion. There is no dissection or aneurysm. Vertebral arteries: The vertebral arteries are patent, without hemodynamically significant stenosis or occlusion. There is no dissection or aneurysm. Skeleton: There is no acute fracture or traumatic malalignment of the cervical spine. There is no visible canal hematoma. There is no suspicious osseous lesion. Other neck: The soft tissues of the neck are unremarkable. Upper chest: The imaged lung apices are clear. IMPRESSION: No evidence of traumatic injury to the vasculature of the neck. No hemodynamically significant stenosis or occlusion. Electronically Signed    By: PValetta MoleM.D.   On: 01/15/2022 18:00   CT C-SPINE NO CHARGE  Result Date: 01/15/2022 CLINICAL DATA:  [place] anticoagulated EXAM: CT CERVICAL SPINE WITHOUT CONTRAST TECHNIQUE: Multidetector CT imaging of the cervical spine was performed without intravenous contrast. Multiplanar CT image reconstructions were also generated. RADIATION DOSE REDUCTION: This exam was performed according to the departmental dose-optimization program which includes automated exposure control, adjustment of the mA and/or kV according to patient size and/or use of iterative reconstruction technique. COMPARISON:  None Available. FINDINGS: Alignment: Alignment is grossly anatomic. Skull base and vertebrae: No acute fracture. No primary bone lesion or focal pathologic process. Soft tissues and spinal canal: No prevertebral fluid or swelling. No visible canal hematoma. Disc levels: C5-6 and C6-7 spondylosis without significant central canal or neural foraminal encroachment. Mild diffuse facet hypertrophy. Upper chest: Airway is patent.  Lung apices are clear. Other: Reconstructed images demonstrate no additional findings. Please refer to the separately reported CT angiography neck exam for description of vascular findings. IMPRESSION: 1. No acute cervical spine fracture. 2. Mild lower cervical spondylosis. Electronically Signed   By: MRanda NgoM.D.   On: 01/15/2022 17:56   CT HEAD WO CONTRAST  Result Date: 01/15/2022 CLINICAL DATA:  [place] anticoagulated EXAM: CT HEAD WITHOUT CONTRAST TECHNIQUE: Contiguous axial images were obtained from the base of the skull through the vertex without intravenous contrast. RADIATION DOSE REDUCTION: This exam was performed according to the departmental dose-optimization program which includes automated exposure control, adjustment of the mA and/or kV according to patient size and/or use of iterative reconstruction technique. COMPARISON:  07/09/2016 FINDINGS: Brain: Portions of the inferior  cerebellum are excluded by slice selection but are included on the separately performed CT of the neck. No evidence  of acute infarct or intracranial hemorrhage. The lateral ventricles and midline structures are unremarkable. No acute extra-axial fluid collections. No mass effect. Vascular: No hyperdense vessel or unexpected calcification. Skull: There is a large right frontal scalp hematoma. Skin staples are identified. No underlying fracture. The remainder of the calvarium is unremarkable. Sinuses/Orbits: No acute finding. Other: None. IMPRESSION: 1. Large right frontal scalp hematoma.  No underlying fracture. 2. No acute intracranial process. Electronically Signed   By: Randa Ngo M.D.   On: 01/15/2022 17:53   DG Chest Port 1 View  Result Date: 01/15/2022 CLINICAL DATA:  Trauma. Post fall. EXAM: PORTABLE CHEST 1 VIEW COMPARISON:  09/27/2017 FINDINGS: Stable cardiomegaly. Unchanged mediastinal contours. Patient is rotated, rotation was also present on prior exam. No pneumothorax, large pleural effusion or focal airspace disease. Chronic peribronchial thickening on limited assessment, no acute osseous findings. IMPRESSION: No evidence of acute traumatic injury. Electronically Signed   By: Keith Rake M.D.   On: 01/15/2022 17:21    ROS - all of the below systems have been reviewed with the patient and positives are indicated with bold text General: chills, fever or night sweats Eyes: blurry vision or double vision ENT: epistaxis or sore throat Allergy/Immunology: itchy/watery eyes or nasal congestion Hematologic/Lymphatic: bleeding problems, blood clots or swollen lymph nodes Endocrine: temperature intolerance or unexpected weight changes Breast: new or changing breast lumps or nipple discharge Resp: cough, shortness of breath, or wheezing CV: chest pain or dyspnea on exertion GI: as per HPI GU: dysuria, trouble voiding, or hematuria MSK: joint pain or joint stiffness Neuro: TIA or stroke  symptoms Derm: pruritus and skin lesion changes Psych: anxiety and depression  PE Blood pressure 119/83, pulse 80, resp. rate 20, height '5\' 3"'$  (1.6 m), weight 103 kg, SpO2 96 %. Constitutional: NAD; conversant; no deformities Eyes: Moist conjunctiva; no lid lag; PERRL Neck: Trachea midline Lungs: Normal respiratory effort CV: RRR; no palpable thrills; no pitting edema GI: Abd soft, NT/ND MSK: Normal range of motion of extremities Psychiatric: Appropriate affect; alert and oriented x3  Results for orders placed or performed during the hospital encounter of 01/15/22 (from the past 48 hour(s))  Comprehensive metabolic panel     Status: Abnormal   Collection Time: 01/15/22  4:35 PM  Result Value Ref Range   Sodium 141 135 - 145 mmol/L   Potassium 3.3 (L) 3.5 - 5.1 mmol/L   Chloride 105 98 - 111 mmol/L   CO2 24 22 - 32 mmol/L   Glucose, Bld 183 (H) 70 - 99 mg/dL    Comment: Glucose reference range applies only to samples taken after fasting for at least 8 hours.   BUN 13 8 - 23 mg/dL   Creatinine, Ser 0.85 0.44 - 1.00 mg/dL   Calcium 9.3 8.9 - 10.3 mg/dL   Total Protein 6.7 6.5 - 8.1 g/dL   Albumin 3.8 3.5 - 5.0 g/dL   AST 22 15 - 41 U/L   ALT 14 0 - 44 U/L   Alkaline Phosphatase 57 38 - 126 U/L   Total Bilirubin 0.9 0.3 - 1.2 mg/dL   GFR, Estimated >60 >60 mL/min    Comment: (NOTE) Calculated using the CKD-EPI Creatinine Equation (2021)    Anion gap 12 5 - 15    Comment: Performed at [facility] [address], [place], [place] [address]  CBC     Status: None   Collection Time: 01/15/22  4:35 PM  Result Value Ref Range   WBC  9.2 4.0 - 10.5 K/uL   RBC 4.54 3.87 - 5.11 MIL/uL   Hemoglobin 14.7 12.0 - 15.0 g/dL   HCT 42.3 36.0 - 46.0 %   MCV 93.2 80.0 - 100.0 fL   MCH 32.4 26.0 - 34.0 pg   MCHC 34.8 30.0 - 36.0 g/dL   RDW 12.5 11.5 - 15.5 %   Platelets 229 150 - 400 K/uL   nRBC 0.0 0.0 - 0.2 %    Comment: Performed at [facility], [place] [address],  [place], [place] [address]  Ethanol     Status: None   Collection Time: 01/15/22  4:35 PM  Result Value Ref Range   Alcohol, Ethyl (B) <10 <10 mg/dL    Comment: (NOTE) Lowest detectable limit for serum alcohol is 10 mg/dL.  For medical purposes only. Performed at [facility], [place] [address] [place], [place] [address]   Protime-INR     Status: None   Collection Time: 01/15/22  4:35 PM  Result Value Ref Range   Prothrombin Time 14.6 11.4 - 15.2 seconds   INR 1.2 0.8 - 1.2    Comment: (NOTE) INR goal varies based on device and disease states. Performed at [facility], [place] [address] [place], [place] [address]   Magnesium     Status: None   Collection Time: 01/15/22  4:35 PM  Result Value Ref Range   Magnesium 1.8 1.7 - 2.4 mg/dL    Comment: Performed at [facility], [place] [address] [place], [place] [address]  Lactic acid, plasma     Status: Abnormal   Collection Time: 01/15/22  4:44 PM  Result Value Ref Range   Lactic Acid, Venous 3.1 (HH) 0.5 - 1.9 mmol/L    Comment: CRITICAL RESULT CALLED TO, READ BACK BY AND VERIFIED WITH J.JACKSON,RN '@1744'$  01/15/2022 VANG.J Performed at [facility], [place] [address], [place], [place] [address]   I-Stat Chem 8, ED     Status: Abnormal   Collection Time: 01/15/22  5:52 PM  Result Value Ref Range   Sodium 139 135 - 145 mmol/L   Potassium 3.3 (L) 3.5 - 5.1 mmol/L   Chloride 101 98 - 111 mmol/L   BUN 13 8 - 23 mg/dL   Creatinine, Ser 0.70 0.44 - 1.00 mg/dL   Glucose, Bld 173 (H) 70 - 99 mg/dL    Comment: Glucose reference range applies only to samples taken after fasting for at least 8 hours.   Calcium, Ion 1.16 1.15 - 1.40 mmol/L   TCO2 24 22 - 32 mmol/L   Hemoglobin 12.6 12.0 - 15.0 g/dL   HCT 37.0 36.0 - 46.0 %  Sample to Blood Bank     Status: None   Collection Time: 01/15/22  6:03 PM  Result Value Ref Range   Blood Bank Specimen SAMPLE AVAILABLE FOR TESTING    Sample Expiration       01/16/2022,2359 Performed at [facility], [place] [address] [place],  [address]     CT ANGIO NECK W OR WO CONTRAST  Result Date: 01/15/2022 CLINICAL DATA:  Fall, neck trauma. EXAM: CT ANGIOGRAPHY NECK TECHNIQUE: Multidetector CT imaging of the neck was performed using the standard protocol during bolus administration of intravenous contrast. Multiplanar CT image reconstructions and MIPs were obtained to evaluate the vascular anatomy. Carotid stenosis measurements (when applicable) are obtained utilizing NASCET criteria, using the distal internal carotid diameter as the denominator. RADIATION DOSE REDUCTION: This  exam was performed according to the departmental dose-optimization program which includes automated exposure control, adjustment of the mA and/or kV according to patient size and/or use of iterative reconstruction technique. CONTRAST:  65m OMNIPAQUE IOHEXOL 350 MG/ML SOLN COMPARISON:  None Available. FINDINGS: Aortic arch: The imaged aortic arch is unremarkable. The origins of the major branch vessels are patent. The subclavian arteries are patent to the level imaged. Right carotid system: The right common, internal, and external carotid arteries are patent, without hemodynamically significant stenosis or occlusion. There is no dissection or aneurysm. Left carotid system: The left common, internal, and external carotid arteries are patent, without hemodynamically significant stenosis or occlusion. There is no dissection or aneurysm. Vertebral arteries: The vertebral arteries are patent, without hemodynamically significant stenosis or occlusion. There is no dissection or aneurysm. Skeleton: There is no acute fracture or traumatic malalignment of the cervical spine. There is no visible canal hematoma. There is no suspicious osseous lesion. Other neck: The soft tissues of the neck are unremarkable. Upper chest: The imaged lung apices are clear. IMPRESSION: No evidence of traumatic injury to  the vasculature of the neck. No hemodynamically significant stenosis or occlusion. Electronically Signed   By: PValetta MoleM.D.   On: 01/15/2022 18:00   CT C-SPINE NO CHARGE  Result Date: 01/15/2022 CLINICAL DATA:  [place] anticoagulated EXAM: CT CERVICAL SPINE WITHOUT CONTRAST TECHNIQUE: Multidetector CT imaging of the cervical spine was performed without intravenous contrast. Multiplanar CT image reconstructions were also generated. RADIATION DOSE REDUCTION: This exam was performed according to the departmental dose-optimization program which includes automated exposure control, adjustment of the mA and/or kV according to patient size and/or use of iterative reconstruction technique. COMPARISON:  None Available. FINDINGS: Alignment: Alignment is grossly anatomic. Skull base and vertebrae: No acute fracture. No primary bone lesion or focal pathologic process. Soft tissues and spinal canal: No prevertebral fluid or swelling. No visible canal hematoma. Disc levels: C5-6 and C6-7 spondylosis without significant central canal or neural foraminal encroachment. Mild diffuse facet hypertrophy. Upper chest: Airway is patent.  Lung apices are clear. Other: Reconstructed images demonstrate no additional findings. Please refer to the separately reported CT angiography neck exam for description of vascular findings. IMPRESSION: 1. No acute cervical spine fracture. 2. Mild lower cervical spondylosis. Electronically Signed   By: MRanda NgoM.D.   On: 01/15/2022 17:56   CT HEAD WO CONTRAST  Result Date: 01/15/2022 CLINICAL DATA:  [place] anticoagulated EXAM: CT HEAD WITHOUT CONTRAST TECHNIQUE: Contiguous axial images were obtained from the base of the skull through the vertex without intravenous contrast. RADIATION DOSE REDUCTION: This exam was performed according to the departmental dose-optimization program which includes automated exposure control, adjustment of the mA and/or kV according to patient size and/or use  of iterative reconstruction technique. COMPARISON:  07/09/2016 FINDINGS: Brain: Portions of the inferior cerebellum are excluded by slice selection but are included on the separately performed CT of the neck. No evidence of acute infarct or intracranial hemorrhage. The lateral ventricles and midline structures are unremarkable. No acute extra-axial fluid collections. No mass effect. Vascular: No hyperdense vessel or unexpected calcification. Skull: There is a large right frontal scalp hematoma. Skin staples are identified. No underlying fracture. The remainder of the calvarium is unremarkable. Sinuses/Orbits: No acute finding. Other: None. IMPRESSION: 1. Large right frontal scalp hematoma.  No underlying fracture. 2. No acute intracranial process. Electronically Signed   By: MRanda NgoM.D.   On: 01/15/2022 17:53   [facility]  Result Date: 01/15/2022 CLINICAL DATA:  Trauma. Post fall. EXAM: PORTABLE CHEST 1 VIEW COMPARISON:  09/27/2017 FINDINGS: Stable cardiomegaly. Unchanged mediastinal contours. Patient is rotated, rotation was also present on prior exam. No pneumothorax, large pleural effusion or focal airspace disease. Chronic peribronchial thickening on limited assessment, no acute osseous findings. IMPRESSION: No evidence of acute traumatic injury. Electronically Signed   By: Keith Rake M.D.   On: 01/15/2022 17:21     A/P: Bonnie Shaw is an 71 y.o. female with HTN, afib (on Eliquis), HLD, hypothyroidism here following fall with scalp hematoma and laceration s/p staple closure  -We have recommended admission for at least monitoring to ensure hemoglobins remained stable and that there is no further bleeding given her hematoma and underlying anticoagulation -If ongoing bleeding or any resumption of bleeding, would plan for to reverse the anticoagulation -We will follow with you  I spent a total of 60 minutes in both face-to-face and non-face-to-face activities, excluding  procedures performed, for this visit on the date of this encounter.  Nadeen Landau, [facility], [place]

## 2022-01-15 NOTE — ED Notes (Signed)
Trauma Response Nurse Documentation  Bonnie Shaw is a 71 y.o. female arriving to [facility] ED via EMS  On Eliquis (apixaban) daily. Trauma was activated as a Level 2 based on the following trauma criteria Elderly patients > 65 with head trauma on anti-coagulation (excluding ASA). Trauma team at the bedside on patient arrival.   Patient cleared for CT by Dr. Armandina Gemma. Pt transported to CT with trauma response nurse present to monitor. RN remained with the patient throughout their absence from the department for clinical observation. GCS 15.  History   Past Medical History:  Diagnosis Date   Anxiety    Arthritis    Cataract    Complication of anesthesia    Woke up during colonoscopy and during Knee surgery. 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   Hyperlipidemia    Hypertension    Hyperthyroidism    s/p RAI therapy   Hypothyroidism    s/p ablation   Left atrial enlargement    severely enlarged   Migraine headache    2x/yr   Motion sickness    roller coasters   Obesity    Panic disorder    Persistent atrial fibrillation ([place])    Snoring      Past Surgical History:  Procedure Laterality Date   ABDOMINAL HYSTERECTOMY     CARDIOVERSION N/A 09/11/2017   Procedure: CARDIOVERSION;  Surgeon: Skeet Latch, MD;  Location: [place];  Service: Cardiovascular;  Laterality: N/A;   CATARACT EXTRACTION     COLONOSCOPY WITH PROPOFOL N/A 03/11/2019   Procedure: COLONOSCOPY WITH PROPOFOL;  Surgeon: Jonathon Bellows, MD;  Location: [place];  Service: Endoscopy;  Laterality: N/A;  colon   KNEE SURGERY Bilateral    replacements   POLYPECTOMY  03/11/2019   Procedure: POLYPECTOMY;  Surgeon: Jonathon Bellows, MD;  Location: [place];  Service: Endoscopy;;   THYROID ABLATION  2010     Initial Focused Assessment (If applicable, or please see trauma  documentation): Patient A&Ox4, GCS 15, PERR 3 Large laceration to forehead, bleeding controlled on scene On arrival bleeding returned, multiple stables placed by Dr Armandina Gemma No other trauma identified  CT's Completed:   CT Head, CT Maxillofacial, CT C-Spine, and CT Angio Head   Interventions:  IV, trauma labs CXR CT Head/Cspine/CT Angio Tdap  Plan for disposition:  Discharge home   Event Summary: Per patient she was at home and hit her head on the corner of a wall causing a large laceration. Large amount of blood on scene, per patient she was able to control bleeding until EMS arrived. On their arrival bleeding resumed and was controlled in the EMS with staples placed by Dr Armandina Gemma. Pressure dressing placed to forehead. Imaging revealed no traumatic injury. Patients family at bedside.   Bedside handoff with ED RN Joya.    Park Pope Thaniel Coluccio  Trauma Response RN  Please call TRN at [phone] for further assistance.

## 2022-01-15 NOTE — ED Provider Notes (Signed)
[facility] EMERGENCY DEPARTMENT Provider Note   CSN: [id number] Arrival date & time: 01/15/22  1628     History  Chief Complaint  Patient presents with   Bonnie Shaw is a 71 y.o. female.   Fall   71 year old female with a medical history significant for atrial fibrillation on Eliquis who presents to the emergency department as a level 2 trauma after a fall.  The patient sustained a large scalp laceration after falling earlier today.  She states that she initially achieved hemostasis but began bleeding again in the ambulance on an route.  On arrival, the patient had uncontrolled bleeding from a scalp laceration.  Staples were placed for wound closure emergently.  She was subsequently ABC intact, GCS 15.    Home Medications Prior to Admission medications   Medication Sig Start Date End Date Taking? Authorizing Provider  atorvastatin (LIPITOR) 20 MG tablet Take by mouth. 01/02/14   [provider]  cholecalciferol (VITAMIN D3) 25 MCG (1000 UT) tablet Take 1,000 Units by mouth daily. Taking 2,'000mg'$  daily    [provider]  ELIQUIS 5 MG TABS tablet TAKE 1 TABLET BY MOUTH  TWICE DAILY 04/17/21   Einar Pheasant, MD  hydrochlorothiazide (HYDRODIURIL) 25 MG tablet TAKE 1 TABLET BY MOUTH DAILY 12/30/21   Einar Pheasant, MD  levothyroxine (SYNTHROID) 50 MCG tablet TAKE 1 TABLET BY MOUTH  DAILY 10/28/21   Einar Pheasant, MD  potassium chloride (KLOR-CON) 10 MEQ tablet TAKE 1 TABLET BY MOUTH TWICE  DAILY 12/13/21   Einar Pheasant, MD  sertraline (ZOLOFT) 50 MG tablet TAKE 1 TABLET BY MOUTH DAILY 12/30/21   Einar Pheasant, MD      Allergies    Ativan [lorazepam], Meloxicam, Trimethoprim, Sulfamethoxazole, and Sulfonamide derivatives    Review of Systems   Review of Systems  Unable to perform ROS: Acuity of condition    Physical Exam Updated Vital Signs BP 107/73   Pulse 90   Resp 18   Ht '5\' 3"'$  (1.6 m)   Wt 103 kg   SpO2 96%    BMI 40.22 kg/m  Physical Exam Vitals and nursing note reviewed.  Constitutional:      General: She is not in acute distress.    Appearance: She is well-developed. She is obese.     Comments: GCS 15, ABC intact  HENT:     Head: Normocephalic.     Comments: Large 4 to 5 cm laceration along the right frontoparietal scalp, actively bleeding Eyes:     Extraocular Movements: Extraocular movements intact.     Conjunctiva/sclera: Conjunctivae normal.     Pupils: Pupils are equal, round, and reactive to light.  Neck:     Comments: No midline tenderness to palpation of the cervical spine.  Range of motion intact Cardiovascular:     Rate and Rhythm: Normal rate and regular rhythm.     Heart sounds: No murmur heard. Pulmonary:     Effort: Pulmonary effort is normal. No respiratory distress.     Breath sounds: Normal breath sounds.  Chest:     Comments: Clavicles stable nontender to AP compression.  Chest wall stable and nontender to AP and lateral compression. Abdominal:     Palpations: Abdomen is soft.     Tenderness: There is no abdominal tenderness.     Comments: Pelvis stable to lateral compression  Musculoskeletal:     Cervical back: Neck supple.     Comments: No midline tenderness to palpation  of the thoracic or lumbar spine.  Extremities atraumatic with intact range of motion  Skin:    General: Skin is warm and dry.  Neurological:     Mental Status: She is alert.     Comments: Cranial nerves II through XII grossly intact.  Moving all 4 extremities spontaneously.  Sensation grossly intact all 4 extremities     ED Results / Procedures / Treatments   Labs (all labs ordered are listed, but only abnormal results are displayed) Labs Reviewed  COMPREHENSIVE METABOLIC PANEL - Abnormal; Notable for the following components:      Result Value   Potassium 3.3 (*)    Glucose, Bld 183 (*)    All other components within normal limits  LACTIC ACID, PLASMA - Abnormal; Notable for the  following components:   Lactic Acid, Venous 3.1 (*)    All other components within normal limits  I-STAT CHEM 8, ED - Abnormal; Notable for the following components:   Potassium 3.3 (*)    Glucose, Bld 173 (*)    All other components within normal limits  CBC  ETHANOL  PROTIME-INR  MAGNESIUM  HEMOGLOBIN AND HEMATOCRIT, BLOOD  URINALYSIS, ROUTINE W REFLEX MICROSCOPIC  SAMPLE TO BLOOD BANK    EKG EKG Interpretation  Date/Time:  Wednesday January 15 2022 18:24:27 EST Ventricular Rate:  127 PR Interval:    QRS Duration: 76 QT Interval:  338 QTC Calculation: 453 R Axis:   15 Text Interpretation: Atrial fibrillation with RVR Low voltage, precordial leads Confirmed by Regan Lemming ([id number]) on 01/15/2022 6:26:26 PM  Radiology CT ANGIO NECK W OR WO CONTRAST  Result Date: 01/15/2022 CLINICAL DATA:  Fall, neck trauma. EXAM: CT ANGIOGRAPHY NECK TECHNIQUE: Multidetector CT imaging of the neck was performed using the standard protocol during bolus administration of intravenous contrast. Multiplanar CT image reconstructions and MIPs were obtained to evaluate the vascular anatomy. Carotid stenosis measurements (when applicable) are obtained utilizing NASCET criteria, using the distal internal carotid diameter as the denominator. RADIATION DOSE REDUCTION: This exam was performed according to the departmental dose-optimization program which includes automated exposure control, adjustment of the mA and/or kV according to patient size and/or use of iterative reconstruction technique. CONTRAST:  29m OMNIPAQUE IOHEXOL 350 MG/ML SOLN COMPARISON:  None Available. FINDINGS: Aortic arch: The imaged aortic arch is unremarkable. The origins of the major branch vessels are patent. The subclavian arteries are patent to the level imaged. Right carotid system: The right common, internal, and external carotid arteries are patent, without hemodynamically significant stenosis or occlusion. There is no dissection or  aneurysm. Left carotid system: The left common, internal, and external carotid arteries are patent, without hemodynamically significant stenosis or occlusion. There is no dissection or aneurysm. Vertebral arteries: The vertebral arteries are patent, without hemodynamically significant stenosis or occlusion. There is no dissection or aneurysm. Skeleton: There is no acute fracture or traumatic malalignment of the cervical spine. There is no visible canal hematoma. There is no suspicious osseous lesion. Other neck: The soft tissues of the neck are unremarkable. Upper chest: The imaged lung apices are clear. IMPRESSION: No evidence of traumatic injury to the vasculature of the neck. No hemodynamically significant stenosis or occlusion. Electronically Signed   By: PValetta MoleM.D.   On: 01/15/2022 18:00   CT C-SPINE NO CHARGE  Result Date: 01/15/2022 CLINICAL DATA:  [place] anticoagulated EXAM: CT CERVICAL SPINE WITHOUT CONTRAST TECHNIQUE: Multidetector CT imaging of the cervical spine was performed without intravenous contrast. Multiplanar CT image reconstructions were  also generated. RADIATION DOSE REDUCTION: This exam was performed according to the departmental dose-optimization program which includes automated exposure control, adjustment of the mA and/or kV according to patient size and/or use of iterative reconstruction technique. COMPARISON:  None Available. FINDINGS: Alignment: Alignment is grossly anatomic. Skull base and vertebrae: No acute fracture. No primary bone lesion or focal pathologic process. Soft tissues and spinal canal: No prevertebral fluid or swelling. No visible canal hematoma. Disc levels: C5-6 and C6-7 spondylosis without significant central canal or neural foraminal encroachment. Mild diffuse facet hypertrophy. Upper chest: Airway is patent.  Lung apices are clear. Other: Reconstructed images demonstrate no additional findings. Please refer to the separately reported CT angiography neck  exam for description of vascular findings. IMPRESSION: 1. No acute cervical spine fracture. 2. Mild lower cervical spondylosis. Electronically Signed   By: Randa Ngo M.D.   On: 01/15/2022 17:56   CT HEAD WO CONTRAST  Result Date: 01/15/2022 CLINICAL DATA:  [place], anticoagulated EXAM: CT HEAD WITHOUT CONTRAST TECHNIQUE: Contiguous axial images were obtained from the base of the skull through the vertex without intravenous contrast. RADIATION DOSE REDUCTION: This exam was performed according to the departmental dose-optimization program which includes automated exposure control, adjustment of the mA and/or kV according to patient size and/or use of iterative reconstruction technique. COMPARISON:  07/09/2016 FINDINGS: Brain: Portions of the inferior cerebellum are excluded by slice selection but are included on the separately performed CT of the neck. No evidence of acute infarct or intracranial hemorrhage. The lateral ventricles and midline structures are unremarkable. No acute extra-axial fluid collections. No mass effect. Vascular: No hyperdense vessel or unexpected calcification. Skull: There is a large right frontal scalp hematoma. Skin staples are identified. No underlying fracture. The remainder of the calvarium is unremarkable. Sinuses/Orbits: No acute finding. Other: None. IMPRESSION: 1. Large right frontal scalp hematoma.  No underlying fracture. 2. No acute intracranial process. Electronically Signed   By: Randa Ngo M.D.   On: 01/15/2022 17:53   DG Chest Port 1 View  Result Date: 01/15/2022 CLINICAL DATA:  Trauma. Post fall. EXAM: PORTABLE CHEST 1 VIEW COMPARISON:  09/27/2017 FINDINGS: Stable cardiomegaly. Unchanged mediastinal contours. Patient is rotated, rotation was also present on prior exam. No pneumothorax, large pleural effusion or focal airspace disease. Chronic peribronchial thickening on limited assessment, no acute osseous findings. IMPRESSION: No evidence of acute traumatic  injury. Electronically Signed   By: Keith Rake M.D.   On: 01/15/2022 17:21    Procedures .Marland KitchenLaceration Repair  Date/Time: 01/15/2022 5:14 PM  Performed by: Regan Lemming, MD Authorized by: Regan Lemming, MD   Consent:    Consent obtained:  Emergent situation   Consent given by:  Patient Universal protocol:    Patient identity confirmed:  Verbally with patient and arm band Anesthesia:    Anesthesia method:  None Laceration details:    Location:  Scalp   Length (cm):  5   Depth (mm):  4 Pre-procedure details:    Preparation:  Imaging obtained to evaluate for foreign bodies Treatment:    Area cleansed with:  Saline   Amount of cleaning:  Standard Skin repair:    Repair method:  Staples   Number of staples:  11 Approximation:    Approximation:  Close Repair type:    Repair type:  Simple Post-procedure details:    Dressing:  Open (no dressing)   Procedure completion:  Tolerated     Medications Ordered in ED Medications  lidocaine-EPINEPHrine (XYLOCAINE W/EPI) 2 %-1:200000 (PF) injection  20 mL (20 mLs Intradermal Given by Other 01/15/22 2000)  LORazepam (ATIVAN) 2 MG/ML injection (1 mg  Given 01/15/22 1725)  iohexol (OMNIPAQUE) 350 MG/ML injection 75 mL (75 mLs Intravenous Contrast Given 01/15/22 1741)  potassium chloride SA (KLOR-CON M) CR tablet 40 mEq (40 mEq Oral Given 01/15/22 1853)  Tdap (BOOSTRIX) injection 0.5 mL (0.5 mLs Intramuscular Given 01/15/22 1856)  metoprolol tartrate (LOPRESSOR) injection 5 mg (5 mg Intravenous Given 01/15/22 1859)    ED Course/ Medical Decision Making/ A&P                           Medical Decision Making Amount and/or Complexity of Data Reviewed Labs: ordered. Radiology: ordered.  Risk Prescription drug management. Decision regarding hospitalization.    71 year old female with a medical history significant for atrial fibrillation on Eliquis who presents to the emergency department as a level 2 trauma after a fall.   The patient sustained a large scalp laceration after falling earlier today.  She states that she initially achieved hemostasis but began bleeding again in the ambulance on an route.  On arrival, the patient had uncontrolled bleeding from a scalp laceration.  Staples were placed for wound closure emergently.  She was subsequently ABC intact, GCS 15.   On arrival, the patient was vitally stable.  She had uncontrolled bleeding which was closed with 11 staples emergently.  She was then taken to CT imaging for CT of the head and neck and CTA of the head and neck which resulted negative for acute traumatic injury, no evidence of vascular abnormality.  The patient does have a large scalp hematoma underlying her staples.  A compressive dressing was placed.  Her tetanus was updated.  CT angio neck, CT head, CT C-spine performed without acute traumatic injury identified.  Chest x-ray was unremarkable.  The patient went into atrial fibrillation with RVR and was administered 5 mg of IV metoprolol with subsequent improvement on rate control.  However, upon attempts at sitting up, the patient's heart rate would jump over 30 points and she would go back into atrial fibrillation with RVR.  This is concerning for orthostatic hypotension in the setting of blood loss.  I did consult trauma surgery who agreed with the plan for admission, repeat evaluation in the morning, admission to medicine for cardiac telemetry.  Remainder of laboratory evaluation significant for CBC without a leukocytosis or anemia, CMP with mild hypokalemia to 3.3, mild hyperglycemia 183, otherwise unremarkable, lactic acidosis noted to 3.1.  Repeat H&H was stable however given the patient's positive orthostatic findings, plan for admission for observation.  Hospitalist consulted for admission.  After decision was made for admission, the patient subsequently changed her mind and requested to leave [place].  I explained the risks of leaving and  going home today in the setting of positive orthostatic changes.  The patient understands the risks of syncope, recurrent trauma, cardiac arrhythmia, bleeding, death.  The patient still requested to leave AMA, she is GCS 15, AAO x 3, with capacity to make this decision.  Provided wound care instructions and dressing change and will have the patient follow-up with outpatient.   Final Clinical Impression(s) / ED Diagnoses Final diagnoses:  Fall, initial encounter  Laceration of scalp, initial encounter  Hematoma of scalp, initial encounter  Atrial fibrillation with RVR (HCC)  Orthostatic hypotension    Rx / DC Orders ED Discharge Orders     None  Regan Lemming, MD 01/15/22 2229

## 2022-01-15 NOTE — ED Notes (Signed)
Pr

## 2022-01-15 NOTE — ED Triage Notes (Signed)
Pt BIB [facility] from home w/ a fall. Pt stated she was walking to her granddaughter's room when she tripped on something and fell and hit her head against the wall. Pt takes Eliquis at home. Pt has history of A-fib and was in A-fib RVR en route with EMS.   EMS VS BP 160/100 P 120-160 CBG 192

## 2022-01-15 NOTE — Discharge Instructions (Addendum)
Your imaging was negative for acute traumatic injury.  You have a large hematoma on your scalp, recommend compressive dressings, Tylenol ibuprofen for pain control.  Watch for signs of infection which would be purulence coming from the wound, worsening pain and swelling, fever or chills. Please follow-up with your primary care physician, urgent care or this emergency department for staple removal and wound recheck in 10 to 14 days.  You have positive orthostatic vital signs which is concerning for orthostatic hypotension in the setting of acute blood loss.  You are intermittently in atrial fibrillation with RVR.  We recommended admission for observation to the medicine service in the setting of this, however after discussion, you have decided to leave Bonnie Shaw.  The risks of leaving include recurrent traumatic injury, syncope, cardiac arrhythmia and death.

## 2022-01-16 ENCOUNTER — Encounter: Payer: Self-pay | Admitting: Internal Medicine

## 2022-01-16 ENCOUNTER — Telehealth: Payer: Self-pay | Admitting: Internal Medicine

## 2022-01-16 ENCOUNTER — Ambulatory Visit (INDEPENDENT_AMBULATORY_CARE_PROVIDER_SITE_OTHER): Payer: Medicare Other | Admitting: Internal Medicine

## 2022-01-16 VITALS — BP 134/80 | HR 90 | Temp 97.5°F | Resp 17 | Ht 63.0 in | Wt 225.4 lb

## 2022-01-16 DIAGNOSIS — S0003XD Contusion of scalp, subsequent encounter: Secondary | ICD-10-CM

## 2022-01-16 DIAGNOSIS — I4821 Permanent atrial fibrillation: Secondary | ICD-10-CM

## 2022-01-16 DIAGNOSIS — R739 Hyperglycemia, unspecified: Secondary | ICD-10-CM

## 2022-01-16 DIAGNOSIS — E785 Hyperlipidemia, unspecified: Secondary | ICD-10-CM | POA: Diagnosis not present

## 2022-01-16 DIAGNOSIS — D6869 Other thrombophilia: Secondary | ICD-10-CM

## 2022-01-16 DIAGNOSIS — E039 Hypothyroidism, unspecified: Secondary | ICD-10-CM

## 2022-01-16 DIAGNOSIS — F419 Anxiety disorder, unspecified: Secondary | ICD-10-CM

## 2022-01-16 DIAGNOSIS — I10 Essential (primary) hypertension: Secondary | ICD-10-CM | POA: Diagnosis not present

## 2022-01-16 DIAGNOSIS — S0003XA Contusion of scalp, initial encounter: Secondary | ICD-10-CM | POA: Insufficient documentation

## 2022-01-16 LAB — CBC WITH DIFFERENTIAL/PLATELET
Basophils Absolute: 0.1 10*3/uL (ref 0.0–0.1)
Basophils Relative: 0.5 % (ref 0.0–3.0)
Eosinophils Absolute: 0.1 10*3/uL (ref 0.0–0.7)
Eosinophils Relative: 0.7 % (ref 0.0–5.0)
HCT: 39.6 % (ref 36.0–46.0)
Hemoglobin: 13.6 g/dL (ref 12.0–15.0)
Lymphocytes Relative: 27.4 % (ref 12.0–46.0)
Lymphs Abs: 3 10*3/uL (ref 0.7–4.0)
MCHC: 34.5 g/dL (ref 30.0–36.0)
MCV: 93.6 fl (ref 78.0–100.0)
Monocytes Absolute: 1 10*3/uL (ref 0.1–1.0)
Monocytes Relative: 9.5 % (ref 3.0–12.0)
Neutro Abs: 6.7 10*3/uL (ref 1.4–7.7)
Neutrophils Relative %: 61.9 % (ref 43.0–77.0)
Platelets: 233 10*3/uL (ref 150.0–400.0)
RBC: 4.23 Mil/uL (ref 3.87–5.11)
RDW: 13.6 % (ref 11.5–15.5)
WBC: 10.8 10*3/uL — ABNORMAL HIGH (ref 4.0–10.5)

## 2022-01-16 LAB — LIPID PANEL
Cholesterol: 155 mg/dL (ref 0–200)
HDL: 44 mg/dL (ref >39.00)
LDL Cholesterol: 84 mg/dL (ref 0–99)
NonHDL: 110.96
Total CHOL/HDL Ratio: 4
Triglycerides: 133 mg/dL (ref 0.0–149.0)
VLDL: 26.6 mg/dL (ref 0.0–40.0)

## 2022-01-16 LAB — TSH: TSH: 2.35 u[IU]/mL (ref 0.35–5.50)

## 2022-01-16 LAB — BASIC METABOLIC PANEL
BUN: 14 mg/dL (ref 6–23)
CO2: 29 mEq/L (ref 19–32)
Calcium: 9.4 mg/dL (ref 8.4–10.5)
Chloride: 103 mEq/L (ref 96–112)
Creatinine, Ser: 0.75 mg/dL (ref 0.40–1.20)
GFR: 79.6 mL/min (ref >60.00)
Glucose, Bld: 123 mg/dL — ABNORMAL HIGH (ref 70–99)
Potassium: 4 mEq/L (ref 3.5–5.1)
Sodium: 140 mEq/L (ref 135–145)

## 2022-01-16 LAB — HEPATIC FUNCTION PANEL
ALT: 10 U/L (ref 0–35)
AST: 11 U/L (ref 0–37)
Albumin: 4.1 g/dL (ref 3.5–5.2)
Alkaline Phosphatase: 61 U/L (ref 39–117)
Bilirubin, Direct: 0.2 mg/dL (ref 0.0–0.3)
Total Bilirubin: 0.9 mg/dL (ref 0.2–1.2)
Total Protein: 6.4 g/dL (ref 6.0–8.3)

## 2022-01-16 LAB — HEMOGLOBIN A1C: Hgb A1c MFr Bld: 6.1 % (ref 4.6–6.5)

## 2022-01-16 MED ORDER — APIXABAN 5 MG PO TABS: 5.0000 mg | ORAL_TABLET | Freq: Two times a day (BID) | ORAL | 3 refills | Status: DC

## 2022-01-16 NOTE — Progress Notes (Signed)
Patient ID: Bonnie Shaw, female   DOB: November 28, 1950, 71 y.o.   MRN: [id number]   Subjective:    Patient ID: Bonnie Shaw, female    DOB: 03-12-1950, 71 y.o.   MRN: [id number]  Patient here for  Chief Complaint  Patient presents with   Hospitalization Follow-up    HPI Here for a scheduled follow up - hypertension, afib and hypercholesterolemia.  Was seen in ER yesterday - evaluation after a fall.  States she tripped and hit head on  corner. Sustained a large scalp laceration.  Was taken to ER via EMS - bleeding from a large scalp laceration.  11 Staples placed.  CT of head and neck - negative for acute traumatic injury.  Did have a large scalp hematoma.  In ER - afib with RVR - given 69m IV metoprolol.  Also concern regarding orthostatic hypotension.  Per note review, ER recommended admission for observation. Per note, she changed her mind and decided not to stay. Reports she is doing relatively well today.  No headache.  No dizziness.  Staples in place.  Son-n-law - [facility]- has been monitoring.  No chest pain or increased heart racing reported.  Is status post left great toe arthroplasty with implant. DOS: 12/05/2021.  Followed by podiatry.  Was not able to exercise for a while due to above.  Started back over the last couple of weeks.     Past Medical History:  Diagnosis Date   Anxiety    Arthritis    Cataract    Complication of anesthesia    Woke up during colonoscopy and during Knee surgery. Also says "nitrous makes me crazy"   Family history of adverse reaction to anesthesia    son woke up during TEE   [place]   recovered   Hyperlipidemia    Hypertension    Hyperthyroidism    s/p RAI therapy   Hypothyroidism    s/p ablation   Left atrial enlargement    severely enlarged   Migraine headache    2x/yr   Motion sickness    roller coasters   Obesity    Panic disorder    Persistent [place]    Snoring    Past Surgical  History:  Procedure Laterality Date   ABDOMINAL HYSTERECTOMY     CARDIOVERSION N/A 09/11/2017   Procedure: CARDIOVERSION;  Surgeon: RSkeet Latch MD;  [place]  Service: Cardiovascular;  Laterality: N/A;   CATARACT EXTRACTION     COLONOSCOPY WITH PROPOFOL N/A 03/11/2019   Procedure: COLONOSCOPY WITH PROPOFOL;  Surgeon: AJonathon Bellows MD;  [place]  Service: Endoscopy;  Laterality: N/A;  colon   KNEE SURGERY Bilateral    replacements   POLYPECTOMY  03/11/2019   Procedure: POLYPECTOMY;  Surgeon: AJonathon Bellows MD;  [place] [facility]  Service: Endoscopy;;   THYROID ABLATION  2010   Family History  Problem Relation Age of Onset   Alzheimer's disease Father    Stroke Mother    Cancer Sister        ovarian    Ovarian cancer Sister 610  Breast cancer Neg Hx    Social History   Socioeconomic History   Marital status: Married    Spouse name: Not on file   Number of children: 4   Years of education: Not on file   Highest education level: Not on file  Occupational History    Employer: UNEMPLOYED  Tobacco Use   Smoking status: Never   Smokeless tobacco: Never  Vaping Use   Vaping Use: Never used  Substance and Sexual Activity   Alcohol use: No   Drug use: No   Sexual activity: Not on file  Other Topics Concern   Not on file  Social History Narrative   Lives in [place] with husband, has 4 children and 89 grandkids         Attends [employer], 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   Social Determinants of Radio broadcast assistant Strain: [place]  (05/02/2021)   Overall Financial Resource Strain (CARDIA)    Difficulty of Paying Living Expenses: Not hard at all  Food Insecurity: No Food Insecurity (05/02/2021)   Hunger Vital Sign    Worried About Running Out of Food in the Last Year: Never true    [place] in the Last Year: Never true  Transportation Needs: No  Transportation Needs (05/02/2021)   PRAPARE - Hydrologist (Medical): No    Lack of Transportation (Non-Medical): No  Physical Activity: Not on file  Stress: No Stress Concern Present (05/02/2021)   [place]    Feeling of Stress : Only a little  Social Connections: Unknown (05/02/2021)   Social Connection and Isolation Panel [NHANES]    Frequency of Communication with Friends and Family: Not on file    Frequency of Social Gatherings with Friends and Family: Not on file    Attends Religious Services: Not on file    Active Member of Clubs or Organizations: Not on file    Attends Archivist Meetings: Not on file    Marital Status: Married     Review of Systems  Constitutional:  Negative for appetite change, fever and unexpected weight change.  HENT:  Negative for congestion and sinus pressure.   Respiratory:  Negative for cough, chest tightness and shortness of breath.   Cardiovascular:  Negative for chest pain and leg swelling.       Denies increased heart rate or palpitations.    Gastrointestinal:  Negative for abdominal pain, diarrhea, nausea and vomiting.  Genitourinary:  Negative for difficulty urinating and dysuria.  Musculoskeletal:  Negative for joint swelling and myalgias.  Skin:  Negative for color change and rash.  Neurological:  Negative for dizziness and headaches.  Psychiatric/Behavioral:  Negative for agitation and dysphoric mood.        Objective:     BP 134/80 (BP Location: Left Arm, Patient Position: Sitting, Cuff Size: Large)   Pulse 90   Temp (!) 97.5 F (36.4 C) (Temporal)   Resp 17   Ht _0  (1.6 m)   Wt 225 lb 6.4 oz (102.2 kg)   SpO2 98%   BMI 39.93 kg/m  Wt Readings from Last 3 Encounters:  01/16/22 225 lb 6.4 oz (102.2 kg)  01/15/22 227 lb 1.2 oz (103 kg)  12/31/21 227 lb (103 kg)    Physical Exam Vitals reviewed.  Constitutional:       General: She is not in acute distress.    Appearance: Normal appearance.  HENT:     Head: Normocephalic.     Comments: Dressing in place - frontoparietal scalp.      Right Ear: External ear normal.     Left Ear: External ear normal.  Nose: No congestion.     Mouth/Throat:     Pharynx: Oropharynx is clear. No oropharyngeal exudate or posterior oropharyngeal erythema.  Eyes:     General: No scleral icterus.       Right eye: No discharge.        Left eye: No discharge.     Conjunctiva/sclera: Conjunctivae normal.  Neck:     Thyroid: No thyromegaly.  Cardiovascular:     Rate and Rhythm: Normal rate and regular rhythm.  Pulmonary:     Effort: No respiratory distress.     Breath sounds: Normal breath sounds. No wheezing.  Abdominal:     General: Bowel sounds are normal.     Palpations: Abdomen is soft.     Tenderness: There is no abdominal tenderness.  Musculoskeletal:        General: No swelling or tenderness.     Cervical back: Neck supple. No tenderness.  Lymphadenopathy:     Cervical: No cervical adenopathy.  Skin:    Findings: No erythema or rash.  Neurological:     Mental Status: She is alert.  Psychiatric:        Mood and Affect: Mood normal.        Behavior: Behavior normal.      Outpatient Encounter Medications as of 01/16/2022  Medication Sig   atorvastatin (LIPITOR) 20 MG tablet Take by mouth.   cholecalciferol (VITAMIN D3) 25 MCG (1000 UT) tablet Take 1,000 Units by mouth daily. Taking 2,065m daily   hydrochlorothiazide (HYDRODIURIL) 25 MG tablet TAKE 1 TABLET BY MOUTH DAILY   levothyroxine (SYNTHROID) 50 MCG tablet TAKE 1 TABLET BY MOUTH  DAILY   potassium chloride (KLOR-CON) 10 MEQ tablet TAKE 1 TABLET BY MOUTH TWICE  DAILY   sertraline (ZOLOFT) 50 MG tablet TAKE 1 TABLET BY MOUTH DAILY   apixaban (ELIQUIS) 5 MG TABS tablet Take 1 tablet (5 mg total) by mouth 2 (two) times daily.   [DISCONTINUED] atorvastatin (LIPITOR) 20 MG tablet TAKE 1 TABLET BY MOUTH  ONCE DAILY   [DISCONTINUED] ELIQUIS 5 MG TABS tablet TAKE 1 TABLET BY MOUTH  TWICE DAILY   No facility-administered encounter medications on file as of 01/16/2022.     Lab Results  Component Value Date   WBC 10.8 (H) 01/16/2022   HGB 13.6 01/16/2022   HCT 39.6 01/16/2022   PLT 233.0 01/16/2022   GLUCOSE 123 (H) 01/16/2022   CHOL 155 01/16/2022   TRIG 133.0 01/16/2022   HDL 44.00 01/16/2022   LDLDIRECT 97.0 11/01/2018   LDLCALC 84 01/16/2022   ALT 10 01/16/2022   AST 11 01/16/2022   NA 140 01/16/2022   K 4.0 01/16/2022   CL 103 01/16/2022   CREATININE 0.75 01/16/2022   BUN 14 01/16/2022   CO2 29 01/16/2022   TSH 2.35 01/16/2022   INR 1.2 01/15/2022   HGBA1C 6.1 01/16/2022   MICROALBUR <0.7 06/11/2015    CT ANGIO NECK W OR WO CONTRAST  Result Date: 01/15/2022 CLINICAL DATA:  Fall, neck trauma. EXAM: CT ANGIOGRAPHY NECK TECHNIQUE: Multidetector CT imaging of the neck was performed using the standard protocol during bolus administration of intravenous contrast. Multiplanar CT image reconstructions and MIPs were obtained to evaluate the vascular anatomy. Carotid stenosis measurements (when applicable) are obtained utilizing NASCET criteria, using the distal internal carotid diameter as the denominator. RADIATION DOSE REDUCTION: This exam was performed according to the departmental dose-optimization program which includes automated exposure control, adjustment of the mA and/or kV according to patient size  and/or use of iterative reconstruction technique. CONTRAST:  71m OMNIPAQUE IOHEXOL 350 MG/ML SOLN COMPARISON:  None Available. FINDINGS: Aortic arch: The imaged aortic arch is unremarkable. The origins of the major branch vessels are patent. The subclavian arteries are patent to the level imaged. Right carotid system: The right common, internal, and external carotid arteries are patent, without hemodynamically significant stenosis or occlusion. There is no dissection or aneurysm. Left  carotid system: The left common, internal, and external carotid arteries are patent, without hemodynamically significant stenosis or occlusion. There is no dissection or aneurysm. Vertebral arteries: The vertebral arteries are patent, without hemodynamically significant stenosis or occlusion. There is no dissection or aneurysm. Skeleton: There is no acute fracture or traumatic malalignment of the cervical spine. There is no visible canal hematoma. There is no suspicious osseous lesion. Other neck: The soft tissues of the neck are unremarkable. Upper chest: The imaged lung apices are clear. IMPRESSION: No evidence of traumatic injury to the vasculature of the neck. No hemodynamically significant stenosis or occlusion. Electronically Signed   By: PValetta MoleM.D.   On: 01/15/2022 18:00   CT C-SPINE NO CHARGE  Result Date: 01/15/2022 CLINICAL DATA:  [place] anticoagulated EXAM: CT CERVICAL SPINE WITHOUT CONTRAST TECHNIQUE: Multidetector CT imaging of the cervical spine was performed without intravenous contrast. Multiplanar CT image reconstructions were also generated. RADIATION DOSE REDUCTION: This exam was performed according to the departmental dose-optimization program which includes automated exposure control, adjustment of the mA and/or kV according to patient size and/or use of iterative reconstruction technique. COMPARISON:  None Available. FINDINGS: Alignment: Alignment is grossly anatomic. Skull base and vertebrae: No acute fracture. No primary bone lesion or focal pathologic process. Soft tissues and spinal canal: No prevertebral fluid or swelling. No visible canal hematoma. Disc levels: C5-6 and C6-7 spondylosis without significant central canal or neural foraminal encroachment. Mild diffuse facet hypertrophy. Upper chest: Airway is patent.  Lung apices are clear. Other: Reconstructed images demonstrate no additional findings. Please refer to the separately reported CT angiography neck exam for  description of vascular findings. IMPRESSION: 1. No acute cervical spine fracture. 2. Mild lower cervical spondylosis. Electronically Signed   By: MRanda NgoM.D.   On: 01/15/2022 17:56   CT HEAD WO CONTRAST  Result Date: 01/15/2022 CLINICAL DATA:  [place] anticoagulated EXAM: CT HEAD WITHOUT CONTRAST TECHNIQUE: Contiguous axial images were obtained from the base of the skull through the vertex without intravenous contrast. RADIATION DOSE REDUCTION: This exam was performed according to the departmental dose-optimization program which includes automated exposure control, adjustment of the mA and/or kV according to patient size and/or use of iterative reconstruction technique. COMPARISON:  07/09/2016 FINDINGS: Brain: Portions of the inferior cerebellum are excluded by slice selection but are included on the separately performed CT of the neck. No evidence of acute infarct or intracranial hemorrhage. The lateral ventricles and midline structures are unremarkable. No acute extra-axial fluid collections. No mass effect. Vascular: No hyperdense vessel or unexpected calcification. Skull: There is a large right frontal scalp hematoma. Skin staples are identified. No underlying fracture. The remainder of the calvarium is unremarkable. Sinuses/Orbits: No acute finding. Other: None. IMPRESSION: 1. Large right frontal scalp hematoma.  No underlying fracture. 2. No acute intracranial process. Electronically Signed   By: MRanda NgoM.D.   On: 01/15/2022 17:53   DG Chest Port 1 View  Result Date: 01/15/2022 CLINICAL DATA:  Trauma. Post fall. EXAM: PORTABLE CHEST 1 VIEW COMPARISON:  09/27/2017 FINDINGS: Stable cardiomegaly. Unchanged mediastinal contours.  Patient is rotated, rotation was also present on prior exam. No pneumothorax, large pleural effusion or focal airspace disease. Chronic peribronchial thickening on limited assessment, no acute osseous findings. IMPRESSION: No evidence of acute traumatic injury.  Electronically Signed   By: Keith Rake M.D.   On: 01/15/2022 17:21       Assessment & Plan:  Primary hypertension Assessment & Plan: Blood pressure as outlined.  On hctz.  Follow pressures.  Follow metabolic panel.    Orders: -     Basic metabolic panel  Permanent atrial fibrillation [facility]) Assessment & Plan: Had episode of afib/rvr in ER.  Given IV metoprolol.  Denies any notice of increased heart rate since.  Rated controlled today on exam.  Has been on eliquis. Monitor for increased heart rate/palpitations.  F/u cardiology.     Hypothyroidism, unspecified type Assessment & Plan: On thyroid replacement.  Follow tsh.   Orders: -     TSH  Hyperlipidemia, unspecified hyperlipidemia type Assessment & Plan: On lipitor.  Low cholesterol diet and exercise.  Follow lipid panel and liver function tests.    Orders: -     Lipid panel -     Hepatic function panel  Hyperglycemia Assessment & Plan: Low carb diet and exercise.  Follow met b and a1c.   Orders: -     Hemoglobin A1c  Hematoma of scalp, subsequent encounter Assessment & Plan: Recent fall and scalp laceration as outlined.  Staples in place.  Dressing in place.  Will need staples removed 10 days.  No headache.  No dizziness.  Call with update.  CT head - no acute intracranial process.    Orders: -     CBC with Differential/Platelet  Acquired thrombophilia ([place]) Assessment & Plan: With afib.  On eliquis.  Follow.    Anxiety Assessment & Plan: Continue zoloft.  Stable.  Follow.     Other orders -     Apixaban; Take 1 tablet (5 mg total) by mouth 2 (two) times daily.  Dispense: 180 tablet; Refill: 3     Einar Pheasant, MD

## 2022-01-16 NOTE — Telephone Encounter (Signed)
Pt would like to be called in regards to her eliquis medication

## 2022-01-17 NOTE — Telephone Encounter (Signed)
See result note.  

## 2022-01-19 ENCOUNTER — Encounter: Payer: Self-pay | Admitting: Internal Medicine

## 2022-01-21 NOTE — Telephone Encounter (Signed)
Ok to schedule for her to come in to have staples removed - if planning to have removed here.  Please confirm when needs to be removed.  Also agree with restarting eliquis.

## 2022-01-21 NOTE — Telephone Encounter (Signed)
See attached regarding staple removal and agree with restarting eliquis.

## 2022-01-24 ENCOUNTER — Encounter: Payer: Medicare Other | Attending: Physician Assistant | Admitting: Physician Assistant

## 2022-01-24 DIAGNOSIS — W2201XD Walked into wall, subsequent encounter: Secondary | ICD-10-CM | POA: Diagnosis not present

## 2022-01-24 DIAGNOSIS — I951 Orthostatic hypotension: Secondary | ICD-10-CM | POA: Insufficient documentation

## 2022-01-24 DIAGNOSIS — Z7901 Long term (current) use of anticoagulants: Secondary | ICD-10-CM | POA: Insufficient documentation

## 2022-01-24 DIAGNOSIS — S0101XD Laceration without foreign body of scalp, subsequent encounter: Secondary | ICD-10-CM | POA: Diagnosis not present

## 2022-01-24 DIAGNOSIS — I480 Paroxysmal atrial fibrillation: Secondary | ICD-10-CM | POA: Diagnosis not present

## 2022-01-24 DIAGNOSIS — M1990 Unspecified osteoarthritis, unspecified site: Secondary | ICD-10-CM | POA: Diagnosis not present

## 2022-01-24 NOTE — Progress Notes (Signed)
SEVILLA, MURTAGH ([id number]) [id number] Page 1 of 9 Visit Report for 01/24/2022 Chief Complaint Document Details Patient Name: Date of Service: Bonnie Shaw, Bonnie Shaw 01/24/2022 8:15 A M Medical Record Number: [id number] Patient Account Number: [id number] Date of Birth/Sex: Treating RN: 01-01-1951 (71 y.o. Mar[place]e [place] Primary Care Provider: Einar Pheasant Other Clinician: Referring Provider: Treating Provider/Extender: Lavonia Dana Weeks in Treatment: 0 Information Obtained from: Patient Chief Complaint Scalp laceration Electronic Signature(s) Signed: 01/24/2022 8:59:48 AM By: Worthy Keeler PA-C Entered By: Worthy Keeler on 01/24/2022 08:59:48 -------------------------------------------------------------------------------- Debridement Details Patient Name: Date of Service: Bonnie Lime. 01/24/2022 8:15 A M Medical Record Number: [id number] Patient Account Number: [id number] Date of Birth/Sex: Treating RN: 1950/05/28 (71 y.o. [place]e Sh[place] Primary Care Provider: Einar Pheasant Other Clinician: Referring Provider: Treating Provider/Extender: Lavonia Dana Weeks in Treatment: 0 Debridement Performed for Assessment: Wound #1 Head - frontal Performed By: Physician Tommie Sams., PA-C Debridement Type: Chemical/Enzymatic/Mechanical Agent Used: Santyl Level of Consciousness (Pre-procedure): Awake and Alert Pre-procedure Verification/Time Out Yes - 09:02 Taken: Instrument: Other : tongue blade Bleeding: None Response to Treatment: Procedure was tolerated well Level of Consciousness (Post- Awake and Alert procedure): Post Debridement Measurements of Total Wound Length: (cm) 4.8 Width: (cm) 0.6 Depth: (cm) 1.2 Volume: (cm) 2.714 Bonnie Shaw, Bonnie Shaw ([id number]) [id number] Page 2 of 9 Character of Wound/Ulcer Post Debridement: Stable Post Procedure Diagnosis Same as  Pre-procedure Electronic Signature(s) Signed: 01/24/2022 1:07:55 PM By: Worthy Keeler PA-C Signed: 01/24/2022 1:50:20 PM By: Gretta Cool, BSN, RN, CWS, Kim RN, BSN Entered By: Gretta Cool, BSN, RN, CWS, Kim on 01/24/2022 09:04:35 -------------------------------------------------------------------------------- HPI Details Patient Name: Date of Service: Bonnie Lime. 01/24/2022 8:15 A M Medical Record Number: [id number] Patient Account Number: [id number] Date of Birth/Sex: Treating RN: 05/21/1950 (71 y.o. [place] Primary Care Provider: Einar Pheasant Other Clinician: Referring Provider: Treating Provider/Extender: Lavonia Dana Weeks in Treatment: 0 History of Present Illness HPI Description: 01-24-2022 patient presents in the office today regarding issues that she has been having with a significant laceration over the right side scalp region extending from just below the hairline up through and into the hair. This is about 5 cm laceration which initially occurred as result of her tripping over an object in a room upstairs in her home and subsequently hitting her head on the edge of a wall right at a corner causing the significant split damage. She initially had a tremendous amount of bleeding. Initially hemostasis was achieved with the use of pressure and then once EMS arrived at the scene they actually applied a dressing to the area as well. She then was opted to be transported to the trauma center due to being on Eliquis and [facility] was the closest trauma center. EMS took her there and subsequently during the course of transport she apparently started bleeding again quite profusely. I did review the notes from the ER admission. Of note the patient arrived in the emergency department as a level 2 trauma after a fall. They actually state that she upon arrival had "uncontrolled bleeding from the scalp laceration". Subsequently staples were placed for wound closure emergently  and she had GCS of 15. Subsequently the laceration repair again being done emergently was performed without even the use of numbing medication due to the fact that they needed to get this done as soon as possible. 11 staples were placed and subsequently the patient was also transported for a  CT scan of the head and spine to ensure that there was no further damage. Everything on the scans came back clear. She did have some evidence of orthostatic hypotension in the setting of blood loss noted. Initially they did plan to admit her and then subsequently decided that she was not going to require admission and were planning to discharge her home to be monitored by family. However when they got her up to walk she began to have more issues with heart rate going up and therefore they went back to wanting her to stay for observation overnight. In the end patient decided [place] to leave the hospital but nonetheless has done well since that time at home. She has not had any major complications. This is at least until last night when the staples were being removed. Unfortunately because of the emergent situation and the staple repair without hemostasis prior the patient had a significant amount of bleeding underneath and actually had a significant hematoma with congealed blood in the wound space. Once the staples were removed this did dehisced and the patient subsequently had bleeding from an arteriole that unfortunately was quite significant. Being that she is on Eliquis that also was not good. For that reason I did have her actually come into my office today for further evaluation and treatment of this wound as I feel like this is can be a more long-term process where Bonnie Shaw can have to allow this to granulate in and heal by second intent. Subsequently also felt that she probably needed antibiotics as well as a prescription for Santyl which I am going to end up sending in for her today. This  laceration site is still quite deep and because of the bleeding and attempt to get the bleeding under control I was not able to evaluate the complete depth of the wound nor do I want a probe for undermining at this point. Again she has been on Eliquis I did have her stop that as of last night and again I feel like the risk of bleeding is much worse and the risk of blood clot at this point. Again she is on Eliquis for atrial fibrillation. We are going to keep her off of the Eliquis until we can see how things go and get the wound moving in the right direction once we get her in a pretty good healing pathway and trajectory we should be able to get her back on the Eliquis. Of note the patient does have a history of hypertension and atrial fibrillation for which she is on Eliquis long-term. Her high blood pressure tends to be worse in the office and better when she is at home. She does seem to have whitecoat syndrome. Electronic Signature(s) Signed: 01/24/2022 1:03:24 PM By: Worthy Keeler PA-C Entered By: Worthy Keeler on 01/24/2022 13:03:24 Bonnie Shaw ([id number]) [id number] Page 3 of 9 -------------------------------------------------------------------------------- Physical Exam Details Patient Name: Date of Service: Bonnie Shaw, Bonnie Shaw 01/24/2022 8:15 A M Medical Record Number: [id number] Patient Account Number: [id number] Date of Birth/Sex: Treating RN: February 03, 1951 (71 y.o. [place] Primary Care Provider: Einar Pheasant Other Clinician: Referring Provider: Treating Provider/Extender: Lavonia Dana Weeks in Treatment: 0 Constitutional patient is hypertensive.. heart rate irregular.. respirations regular, non-labored and within target range for patient.Marland Kitchen temperature within target range for patient.. Obese and well-hydrated in no acute distress. Eyes conjunctiva clear Patient does have some bruising around the eyes from the injury.. pupils  equal round and  reactive to light and accommodation. Ears, Nose, Mouth, and Throat no gross abnormality of ear auricles or external auditory canals. normal hearing noted during conversation. mucus membranes moist. Respiratory normal breathing without difficulty. Musculoskeletal normal gait and posture. no significant deformity or arthritic changes, no loss or range of motion, no clubbing. Psychiatric this patient is able to make decisions and demonstrates good insight into disease process. Alert and Oriented x 3. pleasant and cooperative. Notes Patient's wound at this point is dehisced and does have some necrotic tissue and clotting in the central portion of the wound. Again I did not do any aggressive probing as I do not want this to bleed too aggressively either. I do think that she is going require Santyl to try to help clean up the surface of the wound but this is going to be a slower but I think safer way to go right now. I am good to have her stop the Eliquis which I think is going to be necessary to allow for appropriate healing. Electronic Signature(s) Signed: 01/24/2022 1:05:24 PM By: Worthy Keeler PA-C Entered By: Worthy Keeler on 01/24/2022 13:05:24 -------------------------------------------------------------------------------- Physician Orders Details Patient Name: Date of Service: Bonnie Lime. 01/24/2022 8:15 A M Medical Record Number: [id number] Patient Account Number: [id number] Date of Birth/Sex: Treating RN: 22-Aug-1950 (71 y.o. [place] Primary Care Provider: Einar Pheasant Other Clinician: Referring Provider: Treating Provider/Extender: Lavonia Dana Weeks in Treatment: 0 Verbal / Phone Orders: No Diagnosis Coding Bonnie Shaw, Bonnie Shaw ([id number]) [id number] Page 4 of 9 ICD-10 Coding Code Description S01.01XA Laceration without foreign body of scalp, initial encounter I10 Essential (primary) hypertension I48.0  Paroxysmal atrial fibrillation Z79.01 Long term (current) use of anticoagulants Follow-up Appointments Return Appointment in 1 week. Nurse Visit as needed Bathing/ Shower/ Hygiene Other: - May shower, but do not wash hair this week. Additional Orders / Instructions Other: - Do not bend over. Medications-Please add to medication list. P.O. Antibiotics Santyl Enzymatic Ointment Other: - Hold Eliquis due to excessive bleeding. Wound Treatment Wound #1 - Head - frontal Topical: Santyl Collagenase Ointment, 30 (gm), tube 1 x Per Day/15 Days Discharge Instructions: apply nickel thick to wound bed only Prim Dressing: Silvercel 4 1/4x 4 1/4 (in/in) (DME) (Generic) 1 x Per Day/15 Days ary Discharge Instructions: Apply Silvercel 4 1/4x 4 1/4 (in/in) as instructed Secondary Dressing: Gauze (DME) (Generic) 1 x Per Day/15 Days Discharge Instructions: As directed: dry, moistened with saline or moistened with Dakins Solution Secured With: [employer] Sterile or Non-Sterile 6-ply 4.5x4 (yd/yd) (DME) (Generic) 1 x Per Day/15 Days Discharge Instructions: Apply Kerlix as directed Patient Medications llergies: Sulfa (Sulfonamide Antibiotics), Ativan, trimethoprim A Notifications Medication Indication Start End 01/24/2022 cefdinir DOSE 1 - oral 300 mg capsule - 1 capsule oral twice a day x 14 days 01/24/2022 Santyl DOSE topical 250 unit/gram ointment - ointment topical Apply nickel thick daily to the wound bed and then cover with a dressing as directed in clinic x 30 days Electronic Signature(s) Signed: 01/24/2022 1:07:55 PM By: Worthy Keeler PA-C Signed: 01/24/2022 1:50:20 PM By: Gretta Cool, BSN, RN, CWS, Kim RN, BSN Previous Signature: 01/24/2022 9:24:52 AM Version By: Worthy Keeler PA-C Entered By: Gretta Cool, BSN, RN, CWS, Kim on 01/24/2022 10:14:30 -------------------------------------------------------------------------------- Problem List Details Patient Name: Date of Service: Bonnie Shaw  W. 01/24/2022 8:15 A M Medical Record Number: [id number] Patient Account Number: [id number] Date of Birth/Sex: Treating RN: August 09, 1950 (71 y.o. [place] Primary Care Provider:  Einar Pheasant Other Clinician: Referring Provider: Treating Provider/Extender: Lavonia Dana [place], [place] ([id number]) [id number] Page 5 of 9 Weeks in Treatment: 0 Active Problems ICD-10 Encounter Code Description Active Date MDM Diagnosis S01.01XA Laceration without foreign body of scalp, initial encounter 01/24/2022 No Yes I10 Essential (primary) hypertension 01/24/2022 No Yes I48.0 Paroxysmal atrial fibrillation 01/24/2022 No Yes Z79.01 Long term (current) use of anticoagulants 01/24/2022 No Yes Inactive Problems Resolved Problems Electronic Signature(s) Signed: 01/24/2022 8:46:25 AM By: Worthy Keeler PA-C Entered By: Worthy Keeler on 01/24/2022 08:46:25 -------------------------------------------------------------------------------- Progress Note Details Patient Name: Date of Service: Bonnie Lime. 01/24/2022 8:15 A M Medical Record Number: [id number] Patient Account Number: [id number] Date of Birth/Sex: Treating RN: [birth year] (71 y.o. [place] Primary Care Provider: Einar Pheasant Other Clinician: Referring Provider: Treating Provider/Extender: Lavonia Dana Weeks in Treatment: 0 Subjective Chief Complaint Information obtained from Patient Scalp laceration History of Present Illness (HPI) 01-24-2022 patient presents in the office today regarding issues that she has been having with a significant laceration over the right side scalp region extending from just below the hairline up through and into the hair. This is about 5 cm laceration which initially occurred as result of her tripping over an object in a room upstairs in her home and subsequently hitting her head on the edge of a wall right at a corner causing the  significant split damage. She initially had a tremendous amount of bleeding. Initially hemostasis was achieved with the use of pressure and then once EMS arrived at the scene they actually applied a dressing to the area as well. She then was opted to be transported to the trauma center due to being on Eliquis and [facility] was the closest trauma center. EMS took her there and subsequently during the course of transport she apparently started bleeding again quite profusely. I did review the notes from the ER admission. Of note the patient arrived in the emergency department as a level 2 trauma after a fall. They actually state that she upon arrival had "uncontrolled bleeding from the scalp laceration". Subsequently staples were placed for wound closure emergently and she had GCS of 15. Subsequently the laceration repair again being done emergently was performed without even the use of numbing medication due to the fact that they needed to get this done as soon as possible. 11 staples were placed and subsequently the patient was also transported for a CT scan of the head and spine to ensure that there was no further damage. Everything on the scans came back clear. She did have some evidence of orthostatic hypotension in the setting of blood loss noted. Initially they did plan to admit her and then subsequently decided that she was not going to require admission and were planning to discharge her home to be monitored by family. However when they got her up to walk she began to have more issues with heart rate going up and therefore they went back to wanting her to stay for observation overnight. In the end patient decided [place] to leave the hospital but nonetheless has done well since that time at home. Bonnie Shaw, Bonnie Shaw ([id number]) [id number] Page 6 of 9 She has not had any major complications. This is at least until last night when the staples were being  removed. Unfortunately because of the emergent situation and the staple repair without hemostasis prior the patient had a significant amount of bleeding underneath and actually had a significant  hematoma with congealed blood in the wound space. Once the staples were removed this did dehisced and the patient subsequently had bleeding from an arteriole that unfortunately was quite significant. Being that she is on Eliquis that also was not good. For that reason I did have her actually come into my office today for further evaluation and treatment of this wound as I feel like this is can be a more long-term process where Bonnie Shaw can have to allow this to granulate in and heal by second intent. Subsequently also felt that she probably needed antibiotics as well as a prescription for Santyl which I am going to end up sending in for her today. This laceration site is still quite deep and because of the bleeding and attempt to get the bleeding under control I was not able to evaluate the complete depth of the wound nor do I want a probe for undermining at this point. Again she has been on Eliquis I did have her stop that as of last night and again I feel like the risk of bleeding is much worse and the risk of blood clot at this point. Again she is on Eliquis for atrial fibrillation. We are going to keep her off of the Eliquis until we can see how things go and get the wound moving in the right direction once we get her in a pretty good healing pathway and trajectory we should be able to get her back on the Eliquis. Of note the patient does have a history of hypertension and atrial fibrillation for which she is on Eliquis long-term. Her high blood pressure tends to be worse in the office and better when she is at home. She does seem to have whitecoat syndrome. Patient History Allergies Sulfa (Sulfonamide Antibiotics), Ativan, trimethoprim Social History Never smoker, Marital Status - Married, Alcohol Use -  Never, Drug Use - No History, Caffeine Use - Never. 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 Eyes Denies complaints or symptoms of Dry Eyes, Vision Changes, Glasses / Contacts. Ear/Nose/Mouth/Throat Denies complaints or symptoms of Difficult clearing ears, Sinusitis. Hematologic/Lymphatic Denies complaints or symptoms of Bleeding / Clotting Disorders, Human Immunodeficiency Virus. Endocrine Complains or has symptoms of Thyroid disease. Integumentary (Skin) Complains or has symptoms of Wounds, Bleeding or bruising tendency. Psychiatric Complains or has symptoms of Anxiety. Objective Constitutional patient is hypertensive.. heart rate irregular.. respirations regular, non-labored and within target range for patient.Marland Kitchen temperature within target range for patient.. Obese and well-hydrated in no acute distress. Vitals Time Taken: 8:24 AM, Height: 63 in, Weight: 220 lbs, BMI: 39, Temperature: 97.9 F, Pulse: 80 bpm, Respiratory Rate: 16 breaths/min, Blood Pressure: 142/94 mmHg. General Notes: PA notified of elevated BP. Eyes conjunctiva clear Patient does have some bruising around the eyes from the injury.. pupils equal round and reactive to light and accommodation. Ears, Nose, Mouth, and Throat no gross abnormality of ear auricles or external auditory canals. normal hearing noted during conversation. mucus membranes moist. Respiratory normal breathing without difficulty. Musculoskeletal normal gait and posture. no significant deformity or arthritic changes, no loss or range of motion, no  clubbing. Psychiatric this patient is able to make decisions and demonstrates good insight into disease process. Alert and  Oriented x 3. pleasant and cooperative. General Notes: Patient's wound at this point is dehisced and does have some necrotic tissue and clotting in the central portion of the wound. Again I did not do any aggressive probing as I do not want this to bleed too aggressively either. I do think that she is going require Santyl to try to help clean up the surface of the wound but this is going to be a slower but I think safer way to go right now. I am good to have her stop the Eliquis which I think is going to be necessary to Bonnie Shaw, Bonnie Shaw ([id number]) [id number] Page 7 of 9 allow for appropriate healing. Integumentary (Hair, Skin) Wound #1 status is Open. Original cause of wound was Trauma. The date acquired was: 01/15/2022. The wound is located on the Head - frontal. The wound measures 4.8cm length x 0.6cm width x 1.2cm depth; 2.262cm^2 area and 2.714cm^3 volume. There is Fat Layer (Subcutaneous Tissue) exposed. There is a large amount of sanguinous drainage noted. There is no granulation within the wound bed. There is a large (67-100%) amount of necrotic tissue within the wound bed including Eschar. Assessment Active Problems ICD-10 Laceration without foreign body of scalp, initial encounter Essential (primary) hypertension Paroxysmal atrial fibrillation Long term (current) use of anticoagulants Procedures Wound #1 Pre-procedure diagnosis of Wound #1 is a Trauma, Other located on the Head - frontal . There was a Chemical/Enzymatic/Mechanical debridement performed by Tommie Sams., PA-C. With the following instrument(s): tongue blade. Agent used was [employer]. A time out was conducted at 09:02, prior to the start of the procedure. There was no bleeding. The procedure was tolerated well. Post Debridement Measurements: 4.8cm length x 0.6cm width x  1.2cm depth; 2.714cm^3 volume. Character of Wound/Ulcer Post Debridement is stable. Post procedure Diagnosis Wound #1: Same as Pre-Procedure Plan Follow-up Appointments: Return Appointment in 1 week. Nurse Visit as needed Bathing/ Shower/ Hygiene: Other: - May shower, but do not wash hair this week. Additional Orders / Instructions: Other: - Do not bend over. Medications-Please add to medication list.: P.O. Antibiotics Santyl Enzymatic Ointment Other: - Hold Eliquis due to excessive bleeding. The following medication(s) was prescribed: cefdinir oral 300 mg capsule 1 1 capsule oral twice a day x 14 days starting 01/24/2022 Santyl topical 250 unit/gram ointment ointment topical Apply nickel thick daily to the wound bed and then cover with a dressing as directed in clinic x 30 days starting 01/24/2022 WOUND #1: - Head - frontal Wound Laterality: Topical: Santyl Collagenase Ointment, 30 (gm), tube 1 x Per Day/15 Days Discharge Instructions: apply nickel thick to wound bed only Prim Dressing: Silvercel 4 1/4x 4 1/4 (in/in) (DME) (Generic) 1 x Per Day/15 Days ary Discharge Instructions: Apply Silvercel 4 1/4x 4 1/4 (in/in) as instructed Secondary Dressing: Gauze (DME) (Generic) 1 x Per Day/15 Days Discharge Instructions: As directed: dry, moistened with saline or moistened with Dakins Solution Secured With: [employer] Sterile or Non-Sterile 6-ply 4.5x4 (yd/yd) (DME) (Generic) 1 x Per Day/15 Days Discharge Instructions: Apply Kerlix as directed 1. Based on what I am seeing I do think that the patient is going require some debridement of the necrotic tissue although this is going to be undertaken slowly. I think Santyl is going to be the most appropriate way to go at this point and I discussed that with her today I am going to send in that prescription for her. 2. Will get a use silver cell over top of this this will  catch any excessive drainage I do not want this to be draining through too  much. 3. I am also can recommend the patient should continue with the gauze and/or ABD pads over top of the Santyl in order to apply some pressure and then doing a turban type gauze dressing over top to hold everything in place. 4. I am going to recommend that she is the need to take it easy I really do not want her bending forward also do not want her doing anything too active as far as daily activities are concerned. I think cooking could be okay but I think she needs to make sure that she is not constantly bending forward to do things in the process of doing such. We will see patient back for reevaluation in 2 weeks here in the clinic. If anything worsens or changes patient will contact our office for additional recommendations. Electronic Signature(s) Signed: 01/24/2022 1:06:45 PM By: Waynard Reeds, Ursula Beath ([id number]) [id number] Page 8 of 9 Entered By: Worthy Keeler on 01/24/2022 13:06:44 -------------------------------------------------------------------------------- ROS/PFSH Details Patient Name: Date of Service: Bonnie Shaw, Bonnie Shaw 01/24/2022 8:15 A M Medical Record Number: [id number] Patient Account Number: [id number] Date of Birth/Sex: Treating RN: 06/12/1950 (71 y.o. [place]e [place]ores Primary Care Provider: Einar Pheasant Other Clinician: Referring Provider: Treating Provider/Extender: Lavonia Dana Weeks in Treatment: 0 Constitutional Symptoms (General Health) Complaints and Symptoms: Negative for: Fatigue; Fever; Chills; Marked Weight Change Eyes Complaints and Symptoms: Negative for: Dry Eyes; Vision Changes; Glasses / Contacts Ear/Nose/Mouth/Throat Complaints and Symptoms: Negative for: Difficult clearing ears; Sinusitis Hematologic/Lymphatic Complaints and Symptoms: Negative for: Bleeding / Clotting Disorders; Human Immunodeficiency Virus Endocrine Complaints and Symptoms: Positive for: Thyroid disease Medical  History: Negative for: Type II Diabetes Integumentary (Skin) Complaints and Symptoms: Positive for: Wounds; Bleeding or bruising tendency Psychiatric Complaints and Symptoms: Positive for: Anxiety Cardiovascular Medical History: Positive for: Arrhythmia - A-fib; Hypertension Musculoskeletal Medical History: Positive for: Osteoarthritis Past Medical History Notes: 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 Oncologic Immunizations Bonnie Shaw, Bonnie Shaw ([id number]) [id number] Page 9 of 9 Pneumococcal Vaccine: Received Pneumococcal Vaccination: Yes Received Pneumococcal Vaccination On or After 60th Birthday: Yes Implantable Devices None Family and Social History Never smoker; Marital Status - Married; Alcohol Use: Never; Drug Use: No History; Caffeine Use: Never Electronic Signature(s) Signed: 01/24/2022 1:07:55 PM By: Worthy Keeler PA-C Signed: 01/24/2022 1:50:20 PM By: Gretta Cool, BSN, RN, CWS, Kim RN, BSN Entered By: Gretta Cool, BSN, RN, CWS, Kim on 01/24/2022 08:35:08 -------------------------------------------------------------------------------- SuperBill Details Patient Name: Date of Service: Bonnie Lime. 01/24/2022 Medical Record Number: [id number] Patient Account Number: [id number] Date of Birth/Sex: Treating RN: 1950/11/08 (71 y.o. [place] Primary Care Provider: Einar Pheasant Other Clinician: Referring Provider: Treating Provider/Extender: Lavonia Dana Weeks in Treatment: 0 Diagnosis Coding ICD-10 Codes Code Description S01.01XA Laceration without foreign body of scalp, initial encounter I10 Essential (primary) hypertension I48.0 Paroxysmal atrial fibrillation Z79.01 Long term (current) use of anticoagulants Facility Procedures : CPT4 Code: 60630160 Description: 99213 - WOUND CARE VISIT-LEV 3 EST PT Modifier: Quantity: 1 : CPT4 Code: 10932355 Description: 73220 - DEBRIDE W/O ANES NON SELECT Modifier: Quantity:  1 Physician Procedures : CPT4 Code Description Modifier [id number] 23762 - WC PHYS LEVEL 4 - NEW PT ICD-10 Diagnosis Description S01.01XA Laceration without foreign body of scalp, initial encounter I10 Essential (primary) hypertension I48.0 Paroxysmal atrial fibrillation Z79.01  Long term (current) use of anticoagulants Quantity: 1 Electronic Signature(s) Signed: 01/24/2022 1:07:34 PM  By: Worthy Keeler PA-C Entered By: Worthy Keeler on 01/24/2022 13:07:34

## 2022-01-24 NOTE — Progress Notes (Signed)
Bonnie Shaw ([id number]) [id number] Page 1 of 8 Visit Report for 01/24/2022 Allergy List Details Patient Name: Date of Service: Bonnie Shaw, Bonnie Shaw 01/24/2022 8:15 A M Medical Record Number: [id number] Patient Account Number: [id number] Date of Birth/Sex: Treating RN: 1950-03-10 (71 y.o. [place] Primary Care Ketzia Guzek: Einar Pheasant Other Clinician: Referring Kendarius Vigen: Treating Sherrine Salberg/Extender: Lavonia Dana Weeks in Treatment: 0 Allergies Active Allergies Sulfa (Sulfonamide Antibiotics) Ativan trimethoprim Allergy Notes Electronic Signature(s) Signed: 01/24/2022 1:50:20 PM By: Gretta Cool, BSN, RN, CWS, Kim RN, BSN Entered By: Gretta Cool, BSN, RN, CWS, Kim on 01/24/2022 08:30:11 -------------------------------------------------------------------------------- Arrival Information Details Patient Name: Date of Service: Bonnie Lime. 01/24/2022 8:15 A M Medical Record Number: [id number] Patient Account Number: [id number] Date of Birth/Sex: Treating RN: 01/11/1951 (71 y.o. [place] Primary Care Ciro Tashiro: Einar Pheasant Other Clinician: Referring Dafney Farler: Treating Fredy Gladu/Extender: Lavonia Dana Weeks in Treatment: 0 Visit Information Patient Arrived: Ambulatory Arrival Time: 08:23 Accompanied By: self Transfer Assistance: None Patient Identification Verified: Yes Secondary Verification Process Completed: Yes Patient Requires Transmission-Based Precautions: No Patient Has Alerts: Yes Patient Alerts: Patient on Blood Thinner Eliquis Not Diabetic SHONDELL, FABEL ([id number]) [id number] Page 2 of 8 Electronic Signature(s) Signed: 01/24/2022 1:50:20 PM By: Gretta Cool, BSN, RN, CWS, Kim RN, BSN Entered By: Gretta Cool, BSN, RN, CWS, Kim on 01/24/2022 57:32:20 -------------------------------------------------------------------------------- Clinic Level of Care Assessment Details Patient Name: Date  of Service: Bonnie Lime 01/24/2022 8:15 A M Medical Record Number: [id number] Patient Account Number: [id number] Date of Birth/Sex: Treating RN: Jun 28, 1950 (71 y.o. [place] Primary Care [name]: Einar Pheasant Other Clinician: Referring Agostino Gorin: Treating [name]/Extender: Lavonia Dana Weeks in Treatment: 0 Clinic Level of Care Assessment Items TOOL 1 Quantity Score '[]'$  - 0 Use when EandM and Procedure is performed on INITIAL visit ASSESSMENTS - Nursing Assessment / Reassessment X- 1 20 General Physical Exam (combine w/ comprehensive assessment (listed just below) when performed on new pt. evals) X- 1 25 Comprehensive Assessment (HX, ROS, Risk Assessments, Wounds Hx, etc.) ASSESSMENTS - Wound and Skin Assessment / Reassessment '[]'$  - 0 Dermatologic / Skin Assessment (not related to wound area) ASSESSMENTS - Ostomy and/or Continence Assessment and Care '[]'$  - 0 Incontinence Assessment and Management '[]'$  - 0 Ostomy Care Assessment and Management (repouching, etc.) PROCESS - Coordination of Care X - Simple Patient / Family Education for ongoing care 1 15 '[]'$  - 0 Complex (extensive) Patient / Family Education for ongoing care X- 1 10 Staff obtains Programmer, systems, Records, T Results / Process Orders est '[]'$  - 0 Staff telephones HHA, Nursing Homes / Clarify orders / etc '[]'$  - 0 Routine Transfer to another Facility (non-emergent condition) '[]'$  - 0 Routine Hospital Admission (non-emergent condition) X- 1 15 New Admissions / Biomedical engineer / Ordering NPWT Apligraf, etc. , '[]'$  - 0 Emergency Hospital Admission (emergent condition) PROCESS - Special Needs '[]'$  - 0 Pediatric / Minor Patient Management '[]'$  - 0 Isolation Patient Management '[]'$  - 0 Hearing / Language / Visual special needs '[]'$  - 0 Assessment of Community assistance (transportation, D/C planning, etc.) '[]'$  - 0 Additional assistance / Altered mentation '[]'$  - 0 Support Surface(s) Assessment (bed,  cushion, seat, etc.) INTERVENTIONS - Miscellaneous '[]'$  - 0 External ear exam NAKEYA, ADINOLFI ([id number]) [id number] Page 3 of 8 '[]'$  - 0 Patient Transfer (multiple staff / Civil Service fast streamer / Similar devices) '[]'$  - 0 Simple Staple / Suture removal (25 or less) '[]'$  - 0 Complex Staple / Suture removal (  26 or more) '[]'$  - 0 Hypo/Hyperglycemic Management (do not check if billed separately) '[]'$  - 0 Ankle / Brachial Index (ABI) - do not check if billed separately Has the patient been seen at the hospital within the last three years: Yes Total Score: 85 Level Of Care: New/Established - Level 3 Electronic Signature(s) Signed: 01/24/2022 1:50:20 PM By: Gretta Cool, BSN, RN, CWS, Kim RN, BSN Entered By: Gretta Cool, BSN, RN, CWS, Kim on 01/24/2022 10:15:04 -------------------------------------------------------------------------------- Encounter Discharge Information Details Patient Name: Date of Service: Bonnie Lime. 01/24/2022 8:15 A M Medical Record Number: [id number] Patient Account Number: [id number] Date of Birth/Sex: Treating RN: 02/17/1950 (70 y.o. [place] Primary Care Braylon Grenda: Einar Pheasant Other Clinician: Referring [name]: Treating [name]/Extender: Lavonia Dana Weeks in Treatment: 0 Encounter Discharge Information Items Post Procedure Vitals Discharge Condition: Stable Temperature (F): 97.9 Ambulatory Status: Ambulatory Pulse (bpm): 80 Discharge Destination: Home Respiratory Rate (breaths/min): 16 Transportation: Private Auto Blood Pressure (mmHg): 142/94 Schedule Follow-up Appointment: Yes Clinical Summary of Care: Electronic Signature(s) Signed: 01/24/2022 10:22:32 AM By: Gretta Cool, BSN, RN, CWS, Kim RN, BSN Entered By: Gretta Cool, BSN, RN, CWS, Kim on 01/24/2022 10:22:32 -------------------------------------------------------------------------------- Lower Extremity Assessment Details Patient Name: Date of Service: Bonnie Lime.  01/24/2022 8:15 A M Medical Record Number: [id number] Patient Account Number: [id number] Date of Birth/Sex: Treating RN: [birth year] (71 y.o. [place] Primary Care Alfio Loescher: Einar Pheasant Other Clinician: Referring Bellami Farrelly: Treating Rodolph Hagemann/Extender: Lavonia Dana Weeks in Treatment: 0 [place], [place] ([id number]) [id number] Page 4 of 8 Electronic Signature(s) Signed: 01/24/2022 1:50:20 PM By: Gretta Cool, BSN, RN, CWS, Kim RN, BSN Entered By: Gretta Cool, BSN, RN, CWS, Kim on 01/24/2022 08:25:39 -------------------------------------------------------------------------------- Multi Wound Chart Details Patient Name: Date of Service: Bonnie Lime. 01/24/2022 8:15 A M Medical Record Number: [id number] Patient Account Number: [id number] Date of Birth/Sex: Treating RN: November 23, 1950 (71 y.o. [place] Primary Care Chyla Schlender: Einar Pheasant Other Clinician: Referring Morning Halberg: Treating Jaimere Feutz/Extender: Lavonia Dana Weeks in Treatment: 0 Vital Signs Height(in): 63 Pulse(bpm): 80 Weight(lbs): 220 Blood Pressure(mmHg): 142/94 Body Mass Index(BMI): 39 Temperature(F): 97.9 Respiratory Rate(breaths/min): 16 [1:Photos:] [N/A:N/A] Head - frontal N/A N/A Wound Location: Trauma N/A N/A Wounding Event: Trauma, Other N/A N/A Primary Etiology: Arrhythmia, Hypertension, N/A N/A Comorbid History: Osteoarthritis 01/15/2022 N/A N/A Date Acquired: 0 N/A N/A Weeks of Treatment: Open N/A N/A Wound Status: No N/A N/A Wound Recurrence: 4.8x0.6x0.1 N/A N/A Measurements L x W x D (cm) 2.262 N/A N/A A (cm) : rea 0.226 N/A N/A Volume (cm) : Full Thickness Without Exposed N/A N/A Classification: Support Structures None Present (0%) N/A N/A Granulation Amount: Large (67-100%) N/A N/A Necrotic Amount: Eschar N/A N/A Necrotic Tissue: Fat Layer (Subcutaneous Tissue): Yes N/A N/A Exposed Structures: Fascia: No Tendon:  No Muscle: No Joint: No Bone: No None N/A N/A Epithelialization: Treatment Notes Electronic Signature(s) Signed: 01/24/2022 1:50:20 PM By: Gretta Cool, BSN, RN, CWS, Kim RN, BSN Entered By: Gretta Cool, BSN, RN, CWS, Kim on 01/24/2022 09:00:41 Alla German ([id number]) [id number] Page 5 of 8 -------------------------------------------------------------------------------- Pain Assessment Details Patient Name: Date of Service: LAKETIA, VICKNAIR 01/24/2022 8:15 A M Medical Record Number: [id number] Patient Account Number: [id number] Date of Birth/Sex: Treating RN: September 09, 1950 (71 y.o. [place] Primary Care Laneisha Mino: Einar Pheasant Other Clinician: Referring Brittannie Tawney: Treating Cj Edgell/Extender: Lavonia Dana Weeks in Treatment: 0 Active Problems Location of Pain Severity and Description of Pain Patient Has Paino No Site Locations Pain Management and Medication  Current Pain Management: Electronic Signature(s) Signed: 01/24/2022 1:50:20 PM By: Gretta Cool, BSN, RN, CWS, Kim RN, BSN Entered By: Gretta Cool, BSN, RN, CWS, Kim on 01/24/2022 94:49:67 -------------------------------------------------------------------------------- Patient/Caregiver Education Details Patient Name: Date of Service: Bonnie Lime 12/22/2023andnbsp8:15 A M Medical Record Number: [id number] Patient Account Number: [id number] Date of Birth/Gender: Treating RN: [birth year] (71 y.o. [place] Primary Care Physician: Einar Pheasant Other Clinician: Referring Physician: Treating Physician/Extender: Lavonia Dana Weeks in Treatment: 0 [place], [place] ([id number]) [id number] Page 6 of 8 Education Assessment Education Provided To: Patient Education Topics Provided Welcome T [facility]New [facility] Packet: o Wound/Skin Impairment: Handouts: Caring for Your Ulcer, Other: wound care as prescribed Methods: Demonstration,  Explain/Verbal Responses: State content correctly Electronic Signature(s) Signed: 01/24/2022 1:50:20 PM By: Gretta Cool, BSN, RN, CWS, Kim RN, BSN Entered By: Gretta Cool, BSN, RN, CWS, Kim on 01/24/2022 10:17:19 -------------------------------------------------------------------------------- Wound Assessment Details Patient Name: Date of Service: Bonnie Lime. 01/24/2022 8:15 A M Medical Record Number: [id number] Patient Account Number: [id number] Date of Birth/Sex: Treating RN: Jun 27, 1950 (71 y.o. Charolette Forward, Kim Primary Care Ming Kunka: Einar Pheasant Other Clinician: Referring Jelitza Manninen: Treating Keo Schirmer/Extender: Lavonia Dana Weeks in Treatment: 0 Wound Status Wound Number: 1 Primary Etiology: Trauma, Other Wound Location: Head - frontal Wound Status: Open Wounding Event: Trauma Comorbid History: Arrhythmia, Hypertension, Osteoarthritis Date Acquired: 01/15/2022 Weeks Of Treatment: 0 Clustered Wound: No Photos Wound Measurements Length: (cm) 4.8 Width: (cm) 0.6 Depth: (cm) 1.2 Area: (cm) 2.262 Volume: (cm) 2.714 % Reduction in Area: % Reduction in Volume: Epithelialization: None Wound Description Classification: Full Thickness Without Exposed Support ALYCEN, MACK ([id number]) Exudate Amount: Large Exudate Type: Sanguinous Exudate Color: red Structures Foul Odor After Cleansing: No [id number] Page 7 of 8 Slough/Fibrino Yes Wound Bed Granulation Amount: None Present (0%) Exposed Structure Necrotic Amount: Large (67-100%) Fascia Exposed: No Necrotic Quality: Eschar Fat Layer (Subcutaneous Tissue) Exposed: Yes Tendon Exposed: No Muscle Exposed: No Joint Exposed: No Bone Exposed: No Treatment Notes Wound #1 (Head - frontal) Cleanser Peri-Wound Care Topical Santyl Collagenase Ointment, 30 (gm), tube Discharge Instruction: apply nickel thick to wound bed only Primary Dressing Silvercel 4 1/4x 4 1/4 (in/in) Discharge  Instruction: Apply Silvercel 4 1/4x 4 1/4 (in/in) as instructed Secondary Dressing Gauze Discharge Instruction: As directed: dry, moistened with saline or moistened with Dakins Solution Secured With [employer] Sterile or Non-Sterile 6-ply 4.5x4 (yd/yd) Discharge Instruction: Apply Kerlix as directed Compression Wrap Compression Stockings Environmental education officer) Signed: 01/24/2022 10:11:27 AM By: Gretta Cool, BSN, RN, CWS, Kim RN, BSN Entered By: Gretta Cool, BSN, RN, CWS, Kim on 01/24/2022 10:11:27 -------------------------------------------------------------------------------- Vitals Details Patient Name: Date of Service: Mollie Germany W. 01/24/2022 8:15 A M Medical Record Number: [id number] Patient Account Number: [id number] Date of Birth/Sex: Treating RN: [birth year] (71 y.o. [place] Primary Care Nyomi Howser: Einar Pheasant Other Clinician: Referring Delinda Malan: Treating Donata Reddick/Extender: Lavonia Dana Weeks in Treatment: 0 Vital Signs Time Taken: 08:24 Temperature (F): 97.9 Height (in): 63 Pulse (bpm): 80 Weight (lbs): 220 Respiratory Rate (breaths/min): 16 Body Mass Index (BMI): 39 Blood Pressure (mmHg): 142/94 TARISHA, FADER ([id number]) [id number] Page 8 of 8 Reference Range: 80 - 120 mg / dl Notes PA notified of elevated BP. Electronic Signature(s) Signed: 01/24/2022 1:50:20 PM By: Gretta Cool, BSN, RN, CWS, Kim RN, BSN Entered By: Gretta Cool, BSN, RN, CWS, Kim on 01/24/2022 08:25:31

## 2022-01-24 NOTE — Progress Notes (Signed)
Bonnie Shaw, Bonnie Shaw ([id number]) [phone] [email or web address] Page 1 of 5 Visit Report for 01/24/2022 Abuse Risk Screen Details Patient Name: Date of Service: Bonnie Shaw, Bonnie Shaw 01/24/2022 8:15 A M Medical Record Number: [id number] Patient Account Number: [id number] Date of Birth/Sex: Treating RN: 01/02/1951 (71 y.o. Bonnie Shaw Primary Care Kamrin Spath: Einar Pheasant Other Clinician: Referring Azaiah Licciardi: Treating Rasean Joos/Extender: Lavonia Dana Weeks in Treatment: 0 Abuse Risk Screen Items Answer ABUSE RISK SCREEN: Has anyone close to you tried to hurt or harm you recentlyo No Do you feel uncomfortable with anyone in your familyo No Has anyone forced you do things that you didnt want to doo No Electronic Signature(s) Signed: 01/24/2022 1:50:20 PM By: Gretta Cool, BSN, RN, CWS, Kim RN, BSN Entered By: Gretta Cool, BSN, RN, CWS, Kim on 01/24/2022 08:35:17 -------------------------------------------------------------------------------- Activities of Daily Living Details Patient Name: Date of Service: Bonnie Lime. 01/24/2022 8:15 A M Medical Record Number: [id number] Patient Account Number: [id number] Date of Birth/Sex: Treating RN: 11/01/1950 (71 y.o. Bonnie Shaw Primary Care Jacksen Isip: Einar Pheasant Other Clinician: Referring Catheryne Deford: Treating Dimitri Dsouza/Extender: Lavonia Dana Weeks in Treatment: 0 Activities of Daily Living Items Answer Activities of Daily Living (Please select one for each item) Drive Automobile Completely Able T Medications ake Completely Able Use T elephone Completely Able Care for Appearance Completely Able Use T oilet Completely Able Bath / Shower Completely Able Dress Self Completely Able Feed Self Completely Able Walk Completely Able Get In / Out Bed Completely Able Housework Completely [place], [place] ([id number]) [phone] [email or web address] Page 2 of 5 Prepare Meals Completely  Able Handle Money Completely Able Shop for Self Completely Able Electronic Signature(s) Signed: 01/24/2022 1:50:20 PM By: Gretta Cool, BSN, RN, CWS, Kim RN, BSN Entered By: Gretta Cool, BSN, RN, CWS, Kim on 01/24/2022 08:35:32 -------------------------------------------------------------------------------- Education Screening Details Patient Name: Date of Service: Bonnie Lime. 01/24/2022 8:15 A M Medical Record Number: [id number] Patient Account Number: [id number] Date of Birth/Sex: Treating RN: 08/17/1950 (71 y.o. Bonnie Shaw Primary Care Annaleese Guier: Einar Pheasant Other Clinician: Referring Merion Grimaldo: Treating Mikela Senn/Extender: Lavonia Dana Weeks in Treatment: 0 Primary Learner Assessed: Patient Learning Preferences/Education Level/Primary Language Learning Preference: Explanation Highest Education Level: College or Above Preferred Language: English Cognitive Barrier Language Barrier: No Translator Needed: No Memory Deficit: No Emotional Barrier: No Cultural/Religious Beliefs Affecting Medical Care: No Physical Barrier Impaired Vision: No Impaired Hearing: No Decreased Hand dexterity: No Knowledge/Comprehension Knowledge Level: High Comprehension Level: High Ability to understand written instructions: High Ability to understand verbal instructions: High Motivation Anxiety Level: Calm Cooperation: Cooperative Education Importance: Acknowledges Need Interest in Health Problems: Asks Questions Perception: Coherent Willingness to Engage in Self-Management High Activities: Readiness to Engage in Self-Management High Activities: Engineer, maintenance) Signed: 01/24/2022 1:50:20 PM By: Gretta Cool, BSN, RN, CWS, Kim RN, BSN Entered By: Gretta Cool, BSN, RN, CWS, Kim on 01/24/2022 08:35:55 Bonnie Shaw ([id number]) [phone] [email or web address] Page 3 of 5 -------------------------------------------------------------------------------- Fall Risk  Assessment Details Patient Name: Date of Service: Bonnie Shaw, Bonnie Shaw 01/24/2022 8:15 A M Medical Record Number: [id number] Patient Account Number: [id number] Date of Birth/Sex: Treating RN: November 26, 1950 (71 y.o. Bonnie Shaw Primary Care Oswaldo Cueto: Einar Pheasant Other Clinician: Referring Arthurine Oleary: Treating Jaynia Fendley/Extender: Lavonia Dana Weeks in Treatment: 0 Fall Risk Assessment Items Have you had 2 or more falls in the last 12 monthso 0 Yes Have you had any fall that resulted in injury in the last 12 monthso 0 Yes FALLS RISK SCREEN History of  falling - immediate or within 3 months 25 Yes Secondary diagnosis (Do you have 2 or more medical diagnoseso) 0 No Ambulatory aid None/bed rest/wheelchair/nurse 0 Yes Crutches/cane/walker 0 No Furniture 0 No Intravenous therapy Access/Saline/Heparin Lock 0 No Gait/Transferring Normal/ bed rest/ wheelchair 0 Yes Weak (short steps with or without shuffle, stooped but able to lift head while walking, may seek 0 No support from furniture) Impaired (short steps with shuffle, may have difficulty arising from chair, head down, impaired 0 No balance) Mental Status Oriented to own ability 0 Yes Electronic Signature(s) Signed: 01/24/2022 1:50:20 PM By: Gretta Cool, BSN, RN, CWS, Kim RN, BSN Entered By: Gretta Cool, BSN, RN, CWS, Kim on 01/24/2022 08:36:13 -------------------------------------------------------------------------------- Foot Assessment Details Patient Name: Date of Service: Bonnie Lime. 01/24/2022 8:15 A M Medical Record Number: [id number] Patient Account Number: [id number] Date of Birth/Sex: Treating RN: 02-16-1950 (71 y.o. Bonnie Shaw Primary Care Marleen Moret: Einar Pheasant Other Clinician: Referring Shakima Nisley: Treating Sharlyn Odonnel/Extender: Lavonia Dana Weeks in Treatment: 0 Foot Assessment Items Site Locations [place], [place] ([id number]) [id number] Page 4 of 5 + =  Sensation present, - = Sensation absent, C = Callus, U = Ulcer R = Redness, W = Warmth, M = Maceration, PU = Pre-ulcerative lesion F = Fissure, S = Swelling, D = Dryness Assessment Right: Left: Other Deformity: No No Prior Foot Ulcer: No No Prior Amputation: No No Charcot Joint: No No Ambulatory Status: Ambulatory Without Help Gait: Steady Electronic Signature(s) Signed: 01/24/2022 1:50:20 PM By: Gretta Cool, BSN, RN, CWS, Kim RN, BSN Entered By: Gretta Cool, BSN, RN, CWS, Kim on 01/24/2022 08:36:49 -------------------------------------------------------------------------------- Nutrition Risk Screening Details Patient Name: Date of Service: Bonnie Lime. 01/24/2022 8:15 A M Medical Record Number: [id number] Patient Account Number: [id number] Date of Birth/Sex: Treating RN: 1950/02/16 (71 y.o. Bonnie Shaw Primary Care Gaelen Brager: Einar Pheasant Other Clinician: Referring Sorin Frimpong: Treating Shameca Landen/Extender: Lavonia Dana Weeks in Treatment: 0 Height (in): 63 Weight (lbs): 220 Body Mass Index (BMI): 39 Nutrition Risk Screening Items Score Screening NUTRITION RISK SCREEN: I have an illness or condition that made me change the kind and/or amount of food I eat 0 No I eat fewer than two meals per day 0 No I eat few fruits and vegetables, or milk products 0 No I have three or more drinks of beer, liquor or wine almost every day 0 No I have tooth or mouth problems that make it hard for me to eat 0 No I don't always have enough money to buy the food I need 0 No Bonnie Shaw, Bonnie Shaw ([id number]) [phone] [id number] Page 5 of 5 I eat alone most of the time 0 No I take three or more different prescribed or over-the-counter drugs a day 1 Yes Without wanting to, I have lost or gained 10 pounds in the last six months 0 No I am not always physically able to shop, cook and/or feed myself 0 No Nutrition Protocols Good Risk Protocol 0 No interventions  needed Moderate Risk Protocol High Risk Proctocol Risk Level: Good Risk Score: 1 Electronic Signature(s) Signed: 01/24/2022 1:50:20 PM By: Gretta Cool, BSN, RN, CWS, Kim RN, BSN Entered By: Gretta Cool, BSN, RN, CWS, Kim on 01/24/2022 08:36:30

## 2022-01-29 ENCOUNTER — Telehealth: Payer: Self-pay | Admitting: Internal Medicine

## 2022-01-29 ENCOUNTER — Encounter: Payer: Self-pay | Admitting: Internal Medicine

## 2022-01-29 NOTE — Telephone Encounter (Signed)
Pt would like to be called in regards to a wound on her head

## 2022-01-29 NOTE — Telephone Encounter (Addendum)
error 

## 2022-01-30 ENCOUNTER — Encounter: Payer: Self-pay | Admitting: Internal Medicine

## 2022-01-30 NOTE — Assessment & Plan Note (Signed)
Low carb diet and exercise.  Follow met b and a1c.  

## 2022-01-30 NOTE — Assessment & Plan Note (Signed)
Had episode of afib/rvr in ER.  Given IV metoprolol.  Denies any notice of increased heart rate since.  Rated controlled today on exam.  Has been on eliquis. Monitor for increased heart rate/palpitations.  F/u cardiology.

## 2022-01-30 NOTE — Telephone Encounter (Signed)
S/w pt on phone - pt stated when dressing changed today, no blood from over night.  Pt will continue to monitor through out the day into tomorrow morning, if no blood again, will restart eliquis. If bleeding re-occurs, will stop.  Pt sched w/ [facility] for eval re: wound check and diarrhea 1/2  Pt agreeable to visit and plan

## 2022-01-30 NOTE — Assessment & Plan Note (Signed)
On lipitor.  Low cholesterol diet and exercise.  Follow lipid panel and liver function tests.   

## 2022-01-30 NOTE — Assessment & Plan Note (Signed)
With afib.  On eliquis.  Follow.  

## 2022-01-30 NOTE — Telephone Encounter (Signed)
Per previous message, since no active bleeding, I would restart eliquis.  Confirm no active bleeding.  If any concerns, needs to be reevaluated.

## 2022-01-30 NOTE — Telephone Encounter (Signed)
Agree with need for reevaluation if wound open and concern regarding oozing.  Also, with diarrhea, need evaluation - to help confirm etiology of diarrhea and best treatment.

## 2022-01-30 NOTE — Assessment & Plan Note (Signed)
Continue zoloft.  Stable.  Follow.   

## 2022-01-30 NOTE — Assessment & Plan Note (Signed)
Blood pressure as outlined.  On hctz.  Follow pressures.  Follow metabolic panel.   

## 2022-01-30 NOTE — Assessment & Plan Note (Signed)
Recent fall and scalp laceration as outlined.  Staples in place.  Dressing in place.  Will need staples removed 10 days.  No headache.  No dizziness.  Call with update.  CT head - no acute intracranial process.

## 2022-01-30 NOTE — Telephone Encounter (Signed)
See other mychart from pt

## 2022-01-30 NOTE — Telephone Encounter (Signed)
Per pt is having active bleeding "Some" blood on dressing daily - not large amount, no need to change dressing due to bleeding. Wound is still open.  Ok to sched her w/ [facility] for eval?  Pt also having loose stools from abx - not using probiotic.  Pt advised bland foods and probiotic during abx use, and 2 weeks after.

## 2022-01-30 NOTE — Assessment & Plan Note (Signed)
On thyroid replacement.  Follow tsh.  

## 2022-02-04 ENCOUNTER — Encounter: Payer: Self-pay | Admitting: Nurse Practitioner

## 2022-02-04 ENCOUNTER — Ambulatory Visit (INDEPENDENT_AMBULATORY_CARE_PROVIDER_SITE_OTHER): Payer: Medicare Other | Admitting: Nurse Practitioner

## 2022-02-04 VITALS — BP 128/86 | HR 78 | Temp 98.6°F | Ht 63.0 in | Wt 229.6 lb

## 2022-02-04 DIAGNOSIS — R197 Diarrhea, unspecified: Secondary | ICD-10-CM | POA: Insufficient documentation

## 2022-02-04 DIAGNOSIS — S0003XD Contusion of scalp, subsequent encounter: Secondary | ICD-10-CM | POA: Diagnosis not present

## 2022-02-04 DIAGNOSIS — I4821 Permanent atrial fibrillation: Secondary | ICD-10-CM | POA: Diagnosis not present

## 2022-02-04 NOTE — Assessment & Plan Note (Signed)
Stable. Continue Eliquis '5mg'$  BID. Rate controlled today. Follow up with Cardiology.

## 2022-02-04 NOTE — Assessment & Plan Note (Signed)
Resolved since stopping antibiotics. Encouraged patient to continue daily probiotic and contact me if the diarrhea returns.

## 2022-02-04 NOTE — Progress Notes (Signed)
Bonnie Morrow, NP-C Phone: [phone]  Bonnie Shaw is a 72 y.o. female who presents today for diarrhea and wound check.   Scalp laceration- Patient was in ER 12/13 for large scalp laceration after a fall. Per wound care note review, 11 staples were removed on 12/22  She denies any headaches or dizziness today. She denies any bleeding from the wound. Denies any pain. She is currently seeing wound care daily, her son-in-law is a PA at the wound center [facility].  Diarrhea- Patient reports having diarrhea when on antibiotics last week for her laceration. She reports this has now resolved since stopping the antibiotics. She is taking a daily probiotic.   A-Fib- She is taking her Eliquis daily. Denies any palpitations. No chest pain. No shortness of breath.   Social History   Tobacco Use  Smoking Status Never  Smokeless Tobacco Never    Current Outpatient Medications on File Prior to Visit  Medication Sig Dispense Refill   apixaban (ELIQUIS) 5 MG TABS tablet Take 1 tablet (5 mg total) by mouth 2 (two) times daily. 180 tablet 3   atorvastatin (LIPITOR) 20 MG tablet Take by mouth.     cholecalciferol (VITAMIN D3) 25 MCG (1000 UT) tablet Take 1,000 Units by mouth daily. Taking 2,'000mg'$  daily     hydrochlorothiazide (HYDRODIURIL) 25 MG tablet TAKE 1 TABLET BY MOUTH DAILY 90 tablet 3   levothyroxine (SYNTHROID) 50 MCG tablet TAKE 1 TABLET BY MOUTH  DAILY 90 tablet 3   potassium chloride (KLOR-CON) 10 MEQ tablet TAKE 1 TABLET BY MOUTH TWICE  DAILY 180 tablet 3   sertraline (ZOLOFT) 50 MG tablet TAKE 1 TABLET BY MOUTH DAILY 90 tablet 3   No current facility-administered medications on file prior to visit.     ROS see history of present illness  Objective  Physical Exam Vitals:   02/04/22 0806  BP: 128/86  Pulse: 78  Temp: 98.6 F (37 C)  SpO2: 97%    BP Readings from Last 3 Encounters:  02/04/22 128/86  01/16/22 134/80  01/15/22 118/78   Wt Readings from Last 3 Encounters:   02/04/22 229 lb 9.6 oz (104.1 kg)  01/16/22 225 lb 6.4 oz (102.2 kg)  01/15/22 227 lb 1.2 oz (103 kg)    Physical Exam Constitutional:      General: She is not in acute distress.    Appearance: Normal appearance.  HENT:     Head: Normocephalic.     Comments: Dressing in place- front, right scalp.  Cardiovascular:     Rate and Rhythm: Normal rate and regular rhythm.  Pulmonary:     Effort: Pulmonary effort is normal.     Breath sounds: Normal breath sounds.  Skin:    Findings: Laceration (right, front scalp. Healing well. No bleeding. Minimal serous drainage noted.) present.  Neurological:     General: No focal deficit present.     Mental Status: She is alert.  Psychiatric:        Mood and Affect: Mood normal.        Behavior: Behavior normal.    Assessment/Plan: Please see individual problem list.  Hematoma of scalp, subsequent encounter Assessment & Plan: Staples removed 12/22. Dressing in place. Healing well. Minimal serous drainage noted. No bleeding present. Continue with wound care daily for dressing changes.    Diarrhea, unspecified type Assessment & Plan: Resolved since stopping antibiotics. Encouraged patient to continue daily probiotic and contact me if the diarrhea returns.    Permanent atrial fibrillation ([place])  Assessment & Plan: Stable. Continue Eliquis '5mg'$  BID. Rate controlled today. Follow up with Cardiology.     Return if symptoms worsen or fail to improve.   Bonnie Morrow, NP-C [place]

## 2022-02-04 NOTE — Assessment & Plan Note (Addendum)
Staples removed 12/22. Dressing in place. Healing well. Minimal serous drainage noted. No bleeding present. Continue with wound care daily for dressing changes.

## 2022-02-07 ENCOUNTER — Ambulatory Visit: Payer: Medicare Other | Admitting: Physician Assistant

## 2022-02-11 ENCOUNTER — Ambulatory Visit (INDEPENDENT_AMBULATORY_CARE_PROVIDER_SITE_OTHER): Payer: Medicare Other | Admitting: Podiatry

## 2022-02-11 ENCOUNTER — Ambulatory Visit (INDEPENDENT_AMBULATORY_CARE_PROVIDER_SITE_OTHER): Payer: Medicare Other

## 2022-02-11 VITALS — BP 142/83 | HR 84

## 2022-02-11 DIAGNOSIS — Z9889 Other specified postprocedural states: Secondary | ICD-10-CM | POA: Diagnosis not present

## 2022-02-11 NOTE — Progress Notes (Signed)
No chief complaint on file.   Subjective:  Patient presents today status post left great toe arthroplasty with implant. DOS: 12/05/2021.  Patient is doing well however she presents today to have the incision sites evaluated.  She says that the most proximal and distal portion of the incision site is irritated and there is possible suture within the incision site.  Presenting for further treatment and evaluation   Past Medical History:  Diagnosis Date   Anxiety    Arthritis    Cataract    Complication of anesthesia    Woke up during colonoscopy and during Knee surgery. Also says "nitrous makes me crazy"   Family history of adverse reaction to anesthesia    son woke up during TEE   Guillain-Barre ([place]) 2018   recovered   Hyperlipidemia    Hypertension    Hyperthyroidism    s/p RAI therapy   Hypothyroidism    s/p ablation   Left atrial enlargement    severely enlarged   Migraine headache    2x/yr   Motion sickness    roller coasters   Obesity    Panic disorder    Persistent atrial fibrillation ([place])    Snoring     Past Surgical History:  Procedure Laterality Date   ABDOMINAL HYSTERECTOMY     CARDIOVERSION N/A 09/11/2017   Procedure: CARDIOVERSION;  Surgeon: Skeet Latch, MD;  Location: [facility];  Service: Cardiovascular;  Laterality: N/A;   CATARACT EXTRACTION     COLONOSCOPY WITH PROPOFOL N/A 03/11/2019   Procedure: COLONOSCOPY WITH PROPOFOL;  Surgeon: Jonathon Bellows, MD;  Location: [facility];  Service: Endoscopy;  Laterality: N/A;  colon   KNEE SURGERY Bilateral    replacements   POLYPECTOMY  03/11/2019   Procedure: POLYPECTOMY;  Surgeon: Jonathon Bellows, MD;  Location: [facility];  Service: Endoscopy;;   THYROID ABLATION  2010    Allergies  Allergen Reactions   Ativan [Lorazepam] Other (See Comments)    High sensitivity, cause pt to pass out   Meloxicam Other (See Comments)    Hallucinations   Trimethoprim Other (See Comments)    Sulfamethoxazole Rash   Sulfonamide Derivatives Rash    Objective/Physical Exam Neurovascular status intact.  Skin incision nicely healed with exception of a small area to the most proximal and distal portion of the incision site which appears very superficial and stable with no drainage with visible absorbable Vicryl suture.  Clinically there is no indication of infection.  No edema or erythema  Radiographic Exam LT foot 02/11/2022:  No change since prior x-rays.  Good alignment of the first ray.  Implant appears stable however there is some space between the osteotomy site of the first metatarsal and implant approximately 2 mm.  Will observe for now.  I do not suspect this will create any issues for the patient.   Assessment: 1. s/p left great toe arthroplasty with implant. DOS: 12/05/2021   Plan of Care:  1. Patient was evaluated.  2.  Light debridement was performed today of the most proximal and distal incision site where the absorbable Vicryl sutures are.  The area was debrided and triple antibiotic and a Band-Aid was applied.  Recommend triple antibiotic and a Band-Aid x 1 week.  He should heal uneventfully 3.  Continue full activity no restrictions 4.  Return to clinic as needed  Edrick Kins, DPM [facility]  Dr. Edrick Kins, DPM    [address]  [place], [place] [address]                Office [phone]  Fax [phone]   

## 2022-03-13 ENCOUNTER — Encounter: Payer: Self-pay | Admitting: Internal Medicine

## 2022-03-13 ENCOUNTER — Ambulatory Visit (INDEPENDENT_AMBULATORY_CARE_PROVIDER_SITE_OTHER): Payer: Medicare Other | Admitting: Internal Medicine

## 2022-03-13 ENCOUNTER — Encounter (HOSPITAL_COMMUNITY): Payer: Self-pay | Admitting: *Deleted

## 2022-03-13 VITALS — BP 130/80 | HR 90 | Temp 97.8°F | Resp 16 | Ht 63.0 in | Wt 226.0 lb

## 2022-03-13 DIAGNOSIS — Z1211 Encounter for screening for malignant neoplasm of colon: Secondary | ICD-10-CM

## 2022-03-13 DIAGNOSIS — R3915 Urgency of urination: Secondary | ICD-10-CM

## 2022-03-13 DIAGNOSIS — R079 Chest pain, unspecified: Secondary | ICD-10-CM | POA: Insufficient documentation

## 2022-03-13 DIAGNOSIS — Z1231 Encounter for screening mammogram for malignant neoplasm of breast: Secondary | ICD-10-CM

## 2022-03-13 DIAGNOSIS — K862 Cyst of pancreas: Secondary | ICD-10-CM

## 2022-03-13 DIAGNOSIS — E785 Hyperlipidemia, unspecified: Secondary | ICD-10-CM | POA: Diagnosis not present

## 2022-03-13 DIAGNOSIS — I4821 Permanent atrial fibrillation: Secondary | ICD-10-CM

## 2022-03-13 DIAGNOSIS — I10 Essential (primary) hypertension: Secondary | ICD-10-CM | POA: Diagnosis not present

## 2022-03-13 DIAGNOSIS — R739 Hyperglycemia, unspecified: Secondary | ICD-10-CM

## 2022-03-13 DIAGNOSIS — D6869 Other thrombophilia: Secondary | ICD-10-CM

## 2022-03-13 DIAGNOSIS — R198 Other specified symptoms and signs involving the digestive system and abdomen: Secondary | ICD-10-CM

## 2022-03-13 DIAGNOSIS — E039 Hypothyroidism, unspecified: Secondary | ICD-10-CM

## 2022-03-13 DIAGNOSIS — F419 Anxiety disorder, unspecified: Secondary | ICD-10-CM

## 2022-03-13 LAB — URINALYSIS, ROUTINE W REFLEX MICROSCOPIC
Bilirubin Urine: NEGATIVE
Hgb urine dipstick: NEGATIVE
Ketones, ur: NEGATIVE
Leukocytes,Ua: NEGATIVE
Nitrite: NEGATIVE
Specific Gravity, Urine: 1.02 (ref 1.000–1.030)
Total Protein, Urine: NEGATIVE
Urine Glucose: NEGATIVE
Urobilinogen, UA: 0.2 (ref 0.0–1.0)
pH: 7.5 (ref 5.0–8.0)

## 2022-03-13 NOTE — Progress Notes (Signed)
Subjective:    Patient ID: Bonnie Shaw, female    DOB: 1950-10-24, 72 y.o.   MRN: [id number]  Patient here for  Chief Complaint  Patient presents with   Medical Management of Chronic Issues    HPI Here to follow up regarding blood pressure, afib, blood sugar and hypercholesterolemia.  Recent fall resulting in hematoma of scalp.  Has healed now.  Doing well.  No headache reported.  No chest pain or sob reported.  Tries to stay active.  No increased cough or congestion.  Recently treated with cipro - UTI.  Reports some urine urgency.  Discussed checking to urine to confirm no infection.  Hemorrhoids better.  Probiotics - diarrhea.  Soft stool now.  Appears to be doing well.    Past Medical History:  Diagnosis Date   Anxiety    Arthritis    Cataract    Complication of anesthesia    Woke up during colonoscopy and during Knee surgery. Also says "nitrous makes me crazy"   Family history of adverse reaction to anesthesia    son woke up during TEE   Guillain-Barre ([place]) 2018   recovered   Hyperlipidemia    Hypertension    Hyperthyroidism    s/p RAI therapy   Hypothyroidism    s/p ablation   Left atrial enlargement    severely enlarged   Migraine headache    2x/yr   Motion sickness    roller coasters   Obesity    Panic disorder    Persistent atrial fibrillation ([place])    Snoring    Past Surgical History:  Procedure Laterality Date   ABDOMINAL HYSTERECTOMY     CARDIOVERSION N/A 09/11/2017   Procedure: CARDIOVERSION;  Surgeon: Skeet Latch, MD;  Location: [place];  Service: Cardiovascular;  Laterality: N/A;   CATARACT EXTRACTION     COLONOSCOPY WITH PROPOFOL N/A 03/11/2019   Procedure: COLONOSCOPY WITH PROPOFOL;  Surgeon: Jonathon Bellows, MD;  Location: [place];  Service: Endoscopy;  Laterality: N/A;  colon   KNEE SURGERY Bilateral    replacements   POLYPECTOMY  03/11/2019   Procedure: POLYPECTOMY;  Surgeon: Jonathon Bellows, MD;  Location: [place];   Service: Endoscopy;;   THYROID ABLATION  2010   Family History  Problem Relation Age of Onset   Alzheimer's disease Father    Stroke Mother    Cancer Sister        ovarian    Ovarian cancer Sister 48   Breast cancer Neg Hx    Social History   Socioeconomic History   Marital status: Married    Spouse name: Not on file   Number of children: 4   Years of education: Not on file   Highest education level: Not on file  Occupational History    Employer: UNEMPLOYED  Tobacco Use   Smoking status: Never   Smokeless tobacco: Never  Vaping Use   Vaping Use: Never used  Substance and Sexual Activity   Alcohol use: No   Drug use: No   Sexual activity: Not on file  Other Topics Concern   Not on file  Social History Narrative   Lives in [place] with husband, has 4 children and 15 grandkids         Attends [employer], 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    Writes as a Radiation protection practitioner for the [employer]   Retired LPN   Social Determinants of Health   Financial Resource Strain: [place]  (05/02/2021)  Overall Financial Resource Strain (CARDIA)    Difficulty of Paying Living Expenses: Not hard at all  Food Insecurity: No Food Insecurity (05/02/2021)   Hunger Vital Sign    Worried About Running Out of Food in the Last Year: Never true    Ran Out of Food in the Last Year: Never true  Transportation Needs: No Transportation Needs (05/02/2021)   PRAPARE - Hydrologist (Medical): No    Lack of Transportation (Non-Medical): No  Physical Activity: Not on file  Stress: No Stress Concern Present (05/02/2021)   [place]    Feeling of Stress : Only a little  Social Connections: Unknown (05/02/2021)   Social Connection and Isolation Panel [NHANES]    Frequency of Communication with Friends and Family: Not on file    Frequency of Social Gatherings with Friends and Family: Not on file    Attends  Religious Services: Not on file    Active Member of Clubs or Organizations: Not on file    Attends Archivist Meetings: Not on file    Marital Status: Married     Review of Systems  Constitutional:  Negative for appetite change and unexpected weight change.  HENT:  Negative for congestion and sinus pressure.   Respiratory:  Negative for cough, chest tightness and shortness of breath.   Cardiovascular:  Negative for chest pain and palpitations.       No increased swelling.   Gastrointestinal:  Negative for abdominal pain, nausea and vomiting.       Soft stool as outlined.   Genitourinary:  Negative for difficulty urinating and dysuria.  Musculoskeletal:  Negative for joint swelling and myalgias.  Skin:  Negative for color change and rash.  Neurological:  Negative for dizziness and headaches.  Psychiatric/Behavioral:  Negative for agitation and dysphoric mood.        Objective:     BP 130/80   Pulse 90   Temp 97.8 F (36.6 C)   Resp 16   Ht 5' 3"$  (1.6 m)   Wt 226 lb (102.5 kg)   SpO2 99%   BMI 40.03 kg/m  Wt Readings from Last 3 Encounters:  03/13/22 226 lb (102.5 kg)  02/04/22 229 lb 9.6 oz (104.1 kg)  01/16/22 225 lb 6.4 oz (102.2 kg)    Physical Exam Vitals reviewed.  Constitutional:      General: She is not in acute distress.    Appearance: Normal appearance.  HENT:     Head: Normocephalic and atraumatic.     Right Ear: External ear normal.     Left Ear: External ear normal.  Eyes:     General: No scleral icterus.       Right eye: No discharge.        Left eye: No discharge.     Conjunctiva/sclera: Conjunctivae normal.  Neck:     Thyroid: No thyromegaly.  Cardiovascular:     Rate and Rhythm: Normal rate and regular rhythm.  Pulmonary:     Effort: No respiratory distress.     Breath sounds: Normal breath sounds. No wheezing.  Abdominal:     General: Bowel sounds are normal.     Palpations: Abdomen is soft.     Tenderness: There is no  abdominal tenderness.  Musculoskeletal:        General: No swelling or tenderness.     Cervical back: Neck supple. No tenderness.  Lymphadenopathy:  Cervical: No cervical adenopathy.  Skin:    Findings: No erythema or rash.  Neurological:     Mental Status: She is alert.  Psychiatric:        Mood and Affect: Mood normal.        Behavior: Behavior normal.      Outpatient Encounter Medications as of 03/13/2022  Medication Sig   apixaban (ELIQUIS) 5 MG TABS tablet Take 1 tablet (5 mg total) by mouth 2 (two) times daily.   atorvastatin (LIPITOR) 20 MG tablet Take by mouth.   cholecalciferol (VITAMIN D3) 25 MCG (1000 UT) tablet Take 1,000 Units by mouth daily. Taking 2,044m daily   hydrochlorothiazide (HYDRODIURIL) 25 MG tablet TAKE 1 TABLET BY MOUTH DAILY   levothyroxine (SYNTHROID) 50 MCG tablet TAKE 1 TABLET BY MOUTH  DAILY   potassium chloride (KLOR-CON) 10 MEQ tablet TAKE 1 TABLET BY MOUTH TWICE  DAILY   sertraline (ZOLOFT) 50 MG tablet TAKE 1 TABLET BY MOUTH DAILY   No facility-administered encounter medications on file as of 03/13/2022.     Lab Results  Component Value Date   WBC 10.8 (H) 01/16/2022   HGB 13.6 01/16/2022   HCT 39.6 01/16/2022   PLT 233.0 01/16/2022   GLUCOSE 123 (H) 01/16/2022   CHOL 155 01/16/2022   TRIG 133.0 01/16/2022   HDL 44.00 01/16/2022   LDLDIRECT 97.0 11/01/2018   LDLCALC 84 01/16/2022   ALT 10 01/16/2022   AST 11 01/16/2022   NA 140 01/16/2022   K 4.0 01/16/2022   CL 103 01/16/2022   CREATININE 0.75 01/16/2022   BUN 14 01/16/2022   CO2 29 01/16/2022   TSH 2.35 01/16/2022   INR 1.2 01/15/2022   HGBA1C 6.1 01/16/2022   MICROALBUR <0.7 06/11/2015    CT ANGIO NECK W OR WO CONTRAST  Result Date: 01/15/2022 CLINICAL DATA:  Fall, neck trauma. EXAM: CT ANGIOGRAPHY NECK TECHNIQUE: Multidetector CT imaging of the neck was performed using the standard protocol during bolus administration of intravenous contrast. Multiplanar CT image  reconstructions and MIPs were obtained to evaluate the vascular anatomy. Carotid stenosis measurements (when applicable) are obtained utilizing NASCET criteria, using the distal internal carotid diameter as the denominator. RADIATION DOSE REDUCTION: This exam was performed according to the departmental dose-optimization program which includes automated exposure control, adjustment of the mA and/or kV according to patient size and/or use of iterative reconstruction technique. CONTRAST:  74mOMNIPAQUE IOHEXOL 350 MG/ML SOLN COMPARISON:  None Available. FINDINGS: Aortic arch: The imaged aortic arch is unremarkable. The origins of the major branch vessels are patent. The subclavian arteries are patent to the level imaged. Right carotid system: The right common, internal, and external carotid arteries are patent, without hemodynamically significant stenosis or occlusion. There is no dissection or aneurysm. Left carotid system: The left common, internal, and external carotid arteries are patent, without hemodynamically significant stenosis or occlusion. There is no dissection or aneurysm. Vertebral arteries: The vertebral arteries are patent, without hemodynamically significant stenosis or occlusion. There is no dissection or aneurysm. Skeleton: There is no acute fracture or traumatic malalignment of the cervical spine. There is no visible canal hematoma. There is no suspicious osseous lesion. Other neck: The soft tissues of the neck are unremarkable. Upper chest: The imaged lung apices are clear. IMPRESSION: No evidence of traumatic injury to the vasculature of the neck. No hemodynamically significant stenosis or occlusion. Electronically Signed   By: PeValetta Mole.D.   On: 01/15/2022 18:00   CT C-SPINE NO CHARGE  Result Date: 01/15/2022 CLINICAL DATA:  [place], anticoagulated EXAM: CT CERVICAL SPINE WITHOUT CONTRAST TECHNIQUE: Multidetector CT imaging of the cervical spine was performed without intravenous contrast.  Multiplanar CT image reconstructions were also generated. RADIATION DOSE REDUCTION: This exam was performed according to the departmental dose-optimization program which includes automated exposure control, adjustment of the mA and/or kV according to patient size and/or use of iterative reconstruction technique. COMPARISON:  None Available. FINDINGS: Alignment: Alignment is grossly anatomic. Skull base and vertebrae: No acute fracture. No primary bone lesion or focal pathologic process. Soft tissues and spinal canal: No prevertebral fluid or swelling. No visible canal hematoma. Disc levels: C5-6 and C6-7 spondylosis without significant central canal or neural foraminal encroachment. Mild diffuse facet hypertrophy. Upper chest: Airway is patent.  Lung apices are clear. Other: Reconstructed images demonstrate no additional findings. Please refer to the separately reported CT angiography neck exam for description of vascular findings. IMPRESSION: 1. No acute cervical spine fracture. 2. Mild lower cervical spondylosis. Electronically Signed   By: Randa Ngo M.D.   On: 01/15/2022 17:56   CT HEAD WO CONTRAST  Result Date: 01/15/2022 CLINICAL DATA:  [place], anticoagulated EXAM: CT HEAD WITHOUT CONTRAST TECHNIQUE: Contiguous axial images were obtained from the base of the skull through the vertex without intravenous contrast. RADIATION DOSE REDUCTION: This exam was performed according to the departmental dose-optimization program which includes automated exposure control, adjustment of the mA and/or kV according to patient size and/or use of iterative reconstruction technique. COMPARISON:  07/09/2016 FINDINGS: Brain: Portions of the inferior cerebellum are excluded by slice selection but are included on the separately performed CT of the neck. No evidence of acute infarct or intracranial hemorrhage. The lateral ventricles and midline structures are unremarkable. No acute extra-axial fluid collections. No mass effect.  Vascular: No hyperdense vessel or unexpected calcification. Skull: There is a large right frontal scalp hematoma. Skin staples are identified. No underlying fracture. The remainder of the calvarium is unremarkable. Sinuses/Orbits: No acute finding. Other: None. IMPRESSION: 1. Large right frontal scalp hematoma.  No underlying fracture. 2. No acute intracranial process. Electronically Signed   By: Randa Ngo M.D.   On: 01/15/2022 17:53   DG Chest Port 1 View  Result Date: 01/15/2022 CLINICAL DATA:  Trauma. Post fall. EXAM: PORTABLE CHEST 1 VIEW COMPARISON:  09/27/2017 FINDINGS: Stable cardiomegaly. Unchanged mediastinal contours. Patient is rotated, rotation was also present on prior exam. No pneumothorax, large pleural effusion or focal airspace disease. Chronic peribronchial thickening on limited assessment, no acute osseous findings. IMPRESSION: No evidence of acute traumatic injury. Electronically Signed   By: Keith Rake M.D.   On: 01/15/2022 17:21       Assessment & Plan:  Hyperlipidemia, unspecified hyperlipidemia type Assessment & Plan: On lipitor.  Low cholesterol diet and exercise.  Follow lipid panel and liver function tests.    Orders: -     Lipid panel; Future -     Hepatic function panel; Future  Hyperglycemia Assessment & Plan: Low carb diet and exercise.  Follow met b and a1c.   Orders: -     Hemoglobin A1c; Future  Primary hypertension Assessment & Plan: Blood pressure as outlined.  On hctz.  Follow pressures.  Follow metabolic panel.    Orders: -     Basic metabolic panel; Future -     CBC with Differential/Platelet; Future  Visit for screening mammogram -     3D Screening Mammogram, Left and Right; Future  Urinary urgency Assessment & Plan:  Check urine to confirm no infection.   Orders: -     Urinalysis, Routine w reflex microscopic -     Urine Culture  Acquired thrombophilia ([place]) Assessment & Plan: With afib.  On eliquis.  Follow.     Anxiety Assessment & Plan: Continue zoloft.  Stable.  Follow.     Permanent atrial fibrillation [facility]) Assessment & Plan: Denies any notice of increased heart rate since.  Rated controlled today on exam.  Has been on eliquis. Monitor for increased heart rate/palpitations.    Change in bowel movement Assessment & Plan: Soft stool.  Consider benefiber.    Colon cancer screening Assessment & Plan: Colonoscopy 03/2019.  Recommended f/u in 3 years.  She request to follow up with Dr Alice Reichert.    Hypothyroidism, unspecified type Assessment & Plan: On thyroid replacement.  Follow tsh.    Pancreatic cyst Assessment & Plan: MRI 03/22/21 - 98m pancreatic cystic lesion - likely benign.  Recommended f/u in one year.     Severe obesity (BMI >= 40) (HCC) Assessment & Plan: Diet and exercise.  Follow.       CEinar Pheasant MD

## 2022-03-14 LAB — URINE CULTURE
MICRO NUMBER:: 14538938
SPECIMEN QUALITY:: ADEQUATE

## 2022-03-16 ENCOUNTER — Encounter: Payer: Self-pay | Admitting: Internal Medicine

## 2022-03-16 NOTE — Assessment & Plan Note (Signed)
Soft stool.  Consider benefiber.

## 2022-03-16 NOTE — Assessment & Plan Note (Signed)
On lipitor.  Low cholesterol diet and exercise.  Follow lipid panel and liver function tests.   

## 2022-03-16 NOTE — Assessment & Plan Note (Signed)
Diet and exercise.  Follow.  

## 2022-03-16 NOTE — Assessment & Plan Note (Signed)
On thyroid replacement.  Follow tsh.  

## 2022-03-16 NOTE — Assessment & Plan Note (Signed)
Check urine to confirm no infection.  

## 2022-03-16 NOTE — Assessment & Plan Note (Signed)
Low carb diet and exercise.  Follow met b and a1c.   

## 2022-03-16 NOTE — Assessment & Plan Note (Signed)
With afib.  On eliquis.  Follow.

## 2022-03-16 NOTE — Assessment & Plan Note (Signed)
Continue zoloft.  Stable.  Follow.

## 2022-03-16 NOTE — Assessment & Plan Note (Signed)
Denies any notice of increased heart rate since.  Rated controlled today on exam.  Has been on eliquis. Monitor for increased heart rate/palpitations.

## 2022-03-16 NOTE — Assessment & Plan Note (Signed)
MRI 03/22/21 - 31m pancreatic cystic lesion - likely benign.  Recommended f/u in one year.

## 2022-03-16 NOTE — Assessment & Plan Note (Signed)
Colonoscopy 03/2019.  Recommended f/u in 3 years.  She request to follow up with Dr Alice Reichert.

## 2022-03-16 NOTE — Assessment & Plan Note (Signed)
Blood pressure as outlined.  On hctz.  Follow pressures.  Follow metabolic panel.

## 2022-03-17 ENCOUNTER — Telehealth: Payer: Self-pay

## 2022-03-17 NOTE — Telephone Encounter (Signed)
-----   Message from Einar Pheasant, MD sent at 03/16/2022 12:08 PM EST ----- Notify Ms Jamerson that her urine culture was negative for infection.  Let us know if any problems.  Also, in reviewing chart, she had an MRI 03/2021 - found pancreatic cyst.  Appeared benign, but they recommended a f/u MRI in one year.  Is due.  Is she ok to schedule?

## 2022-03-17 NOTE — Telephone Encounter (Signed)
LMTCB

## 2022-03-17 NOTE — Telephone Encounter (Signed)
Patient states she is returning call from Roetta Sessions, LPN.  I read Dr. Randell Patient Scott's message to patient.  Patient states she would like to think about whether or not she would like to have a follow-up MRI.

## 2022-03-18 NOTE — Telephone Encounter (Signed)
Noted  

## 2022-04-09 ENCOUNTER — Other Ambulatory Visit: Payer: Self-pay | Admitting: Internal Medicine

## 2022-04-09 DIAGNOSIS — K862 Cyst of pancreas: Secondary | ICD-10-CM

## 2022-04-23 ENCOUNTER — Other Ambulatory Visit: Payer: Self-pay | Admitting: Internal Medicine

## 2022-04-23 DIAGNOSIS — K862 Cyst of pancreas: Secondary | ICD-10-CM

## 2022-04-29 ENCOUNTER — Telehealth: Payer: Self-pay | Admitting: Internal Medicine

## 2022-04-29 NOTE — Telephone Encounter (Signed)
Called patient to schedule Medicare Annual Wellness Visit (AWV). Left message for patient to call back and schedule Medicare Annual Wellness Visit (AWV).  Last date of AWV: 05/02/2021   Please schedule an AWVS appointment at any time with Harvey.  If any questions, please contact me at [phone].    Thank you,  [place] Direct dial  [phone]

## 2022-05-01 ENCOUNTER — Telehealth: Payer: Self-pay

## 2022-05-01 NOTE — Telephone Encounter (Signed)
We received a preoperative clearance form from [facility].  I sent a copy to Dr. Randell Patient Scott's folder on the S drive and hand-delivered a copy to Roetta Sessions, LPN.

## 2022-05-05 ENCOUNTER — Telehealth: Payer: Self-pay | Admitting: Internal Medicine

## 2022-05-05 NOTE — Telephone Encounter (Signed)
Contacted Alla German to schedule their annual wellness visit. Appointment made for 05/09/2022.  Thank you,  [place] Direct dial  [phone]

## 2022-05-06 ENCOUNTER — Ambulatory Visit
Admission: RE | Admit: 2022-05-06 | Discharge: 2022-05-06 | Disposition: A | Discharge status: Home / Self Care | Payer: Medicare Other | Source: Ambulatory Visit | Attending: Internal Medicine | Admitting: Internal Medicine

## 2022-05-06 DIAGNOSIS — K862 Cyst of pancreas: Secondary | ICD-10-CM | POA: Insufficient documentation

## 2022-05-06 MED ORDER — GADOBUTROL 1 MMOL/ML IV SOLN
10.0000 mL | Freq: Once | INTRAVENOUS | Status: AC | PRN
  Administered 2022-05-06: 10 mL via INTRAVENOUS

## 2022-05-09 ENCOUNTER — Ambulatory Visit (INDEPENDENT_AMBULATORY_CARE_PROVIDER_SITE_OTHER): Payer: Medicare Other

## 2022-05-09 VITALS — Ht 63.0 in | Wt 226.0 lb

## 2022-05-09 DIAGNOSIS — Z0000 Encounter for general adult medical examination without abnormal findings: Secondary | ICD-10-CM

## 2022-05-09 NOTE — Patient Instructions (Addendum)
Bonnie Shaw , Thank you for taking time to come for your Medicare Wellness Visit. I appreciate your ongoing commitment to your health goals. Please review the following plan we discussed and let me know if I can assist you in the future.   These are the goals we discussed:  Goals      Follow up with Primary Care Provider     As needed     Weight (lb) < 200 lb (90.7 kg)     Weight goal 175lb Reduce sugar intake Stay active        This is a list of the screening recommended for you and due dates:  Health Maintenance  Topic Date Due   Colon Cancer Screening  03/10/2022   COVID-19 Vaccine (4 - 2023-24 season) 05/25/2022*   Zoster (Shingles) Vaccine (1 of 2) 08/08/2022*   Flu Shot  09/04/2022   Mammogram  03/30/2023   Medicare Annual Wellness Visit  05/09/2023   Pneumonia Vaccine  Completed   DEXA scan (bone density measurement)  Completed   Hepatitis C Screening: USPSTF Recommendation to screen - Ages 63-79 yo.  Completed   HPV Vaccine  Aged Out   DTaP/Tdap/Td vaccine  Discontinued  *Topic was postponed. The date shown is not the original due date.    Advanced directives: not yet completed  Conditions/risks identified: none new  Next appointment: Follow up in one year for your annual wellness visit    Preventive Care 65 Years and Older, Female Preventive care refers to lifestyle choices and visits with your health care provider that can promote health and wellness. What does preventive care include? A yearly physical exam. This is also called an annual well check. Dental exams once or twice a year. Routine eye exams. Ask your health care provider how often you should have your eyes checked. Personal lifestyle choices, including: Daily care of your teeth and gums. Regular physical activity. Eating a healthy diet. Avoiding tobacco and drug use. Limiting alcohol use. Practicing safe sex. Taking low-dose aspirin every day. Taking vitamin and mineral supplements as  recommended by your health care provider. What happens during an annual well check? The services and screenings done by your health care provider during your annual well check will depend on your age, overall health, lifestyle risk factors, and family history of disease. Counseling  Your health care provider may ask you questions about your: Alcohol use. Tobacco use. Drug use. Emotional well-being. Home and relationship well-being. Sexual activity. Eating habits. History of falls. Memory and ability to understand (cognition). Work and work Astronomer. Reproductive health. Screening  You may have the following tests or measurements: Height, weight, and BMI. Blood pressure. Lipid and cholesterol levels. These may be checked every 5 years, or more frequently if you are over 74 years old. Skin check. Lung cancer screening. You may have this screening every year starting at age 14 if you have a 30-pack-year history of smoking and currently smoke or have quit within the past 15 years. Fecal occult blood test (FOBT) of the stool. You may have this test every year starting at age 108. Flexible sigmoidoscopy or colonoscopy. You may have a sigmoidoscopy every 5 years or a colonoscopy every 10 years starting at age 38. Hepatitis C blood test. Hepatitis B blood test. Sexually transmitted disease (STD) testing. Diabetes screening. This is done by checking your blood sugar (glucose) after you have not eaten for a while (fasting). You may have this done every 1-3 years. Bone density scan. This  is done to screen for osteoporosis. You may have this done starting at age 60. Mammogram. This may be done every 1-2 years. Talk to your health care provider about how often you should have regular mammograms. Talk with your health care provider about your test results, treatment options, and if necessary, the need for more tests. Vaccines  Your health care provider may recommend certain vaccines, such  as: Influenza vaccine. This is recommended every year. Tetanus, diphtheria, and acellular pertussis (Tdap, Td) vaccine. You may need a Td booster every 10 years. Zoster vaccine. You may need this after age 45. Pneumococcal 13-valent conjugate (PCV13) vaccine. One dose is recommended after age 67. Pneumococcal polysaccharide (PPSV23) vaccine. One dose is recommended after age 27. Talk to your health care provider about which screenings and vaccines you need and how often you need them. This information is not intended to replace advice given to you by your health care provider. Make sure you discuss any questions you have with your health care provider. Document Released: 02/16/2015 Document Revised: 10/10/2015 Document Reviewed: 11/21/2014 Elsevier Interactive Patient Education  2017 [employer] Prevention in the Home Falls can cause injuries. They can happen to people of all ages. There are many things you can do to make your home safe and to help prevent falls. What can I do on the outside of my home? Regularly fix the edges of walkways and driveways and fix any cracks. Remove anything that might make you trip as you walk through a door, such as a raised step or threshold. Trim any bushes or trees on the path to your home. Use bright outdoor lighting. Clear any walking paths of anything that might make someone trip, such as rocks or tools. Regularly check to see if handrails are loose or broken. Make sure that both sides of any steps have handrails. Any raised decks and porches should have guardrails on the edges. Have any leaves, snow, or ice cleared regularly. Use sand or salt on walking paths during winter. Clean up any spills in your garage right away. This includes oil or grease spills. What can I do in the bathroom? Use night lights. Install grab bars by the toilet and in the tub and shower. Do not use towel bars as grab bars. Use non-skid mats or decals in the tub or  shower. If you need to sit down in the shower, use a plastic, non-slip stool. Keep the floor dry. Clean up any water that spills on the floor as soon as it happens. Remove soap buildup in the tub or shower regularly. Attach bath mats securely with double-sided non-slip rug tape. Do not have throw rugs and other things on the floor that can make you trip. What can I do in the bedroom? Use night lights. Make sure that you have a light by your bed that is easy to reach. Do not use any sheets or blankets that are too big for your bed. They should not hang down onto the floor. Have a firm chair that has side arms. You can use this for support while you get dressed. Do not have throw rugs and other things on the floor that can make you trip. What can I do in the kitchen? Clean up any spills right away. Avoid walking on wet floors. Keep items that you use a lot in easy-to-reach places. If you need to reach something above you, use a strong step stool that has a grab bar. Keep electrical cords out  of the way. Do not use floor polish or wax that makes floors slippery. If you must use wax, use non-skid floor wax. Do not have throw rugs and other things on the floor that can make you trip. What can I do with my stairs? Do not leave any items on the stairs. Make sure that there are handrails on both sides of the stairs and use them. Fix handrails that are broken or loose. Make sure that handrails are as long as the stairways. Check any carpeting to make sure that it is firmly attached to the stairs. Fix any carpet that is loose or worn. Avoid having throw rugs at the top or bottom of the stairs. If you do have throw rugs, attach them to the floor with carpet tape. Make sure that you have a light switch at the top of the stairs and the bottom of the stairs. If you do not have them, ask someone to add them for you. What else can I do to help prevent falls? Wear shoes that: Do not have high heels. Have  rubber bottoms. Are comfortable and fit you well. Are closed at the toe. Do not wear sandals. If you use a stepladder: Make sure that it is fully opened. Do not climb a closed stepladder. Make sure that both sides of the stepladder are locked into place. Ask someone to hold it for you, if possible. Clearly mark and make sure that you can see: Any grab bars or handrails. First and last steps. Where the edge of each step is. Use tools that help you move around (mobility aids) if they are needed. These include: Canes. Walkers. Scooters. Crutches. Turn on the lights when you go into a dark area. Replace any light bulbs as soon as they burn out. Set up your furniture so you have a clear path. Avoid moving your furniture around. If any of your floors are uneven, fix them. If there are any pets around you, be aware of where they are. Review your medicines with your doctor. Some medicines can make you feel dizzy. This can increase your chance of falling. Ask your doctor what other things that you can do to help prevent falls. This information is not intended to replace advice given to you by your health care provider. Make sure you discuss any questions you have with your health care provider. Document Released: 11/16/2008 Document Revised: 06/28/2015 Document Reviewed: 02/24/2014 Elsevier Interactive Patient Education  2017 [employer].

## 2022-05-09 NOTE — Progress Notes (Signed)
Subjective:   Bonnie Shaw is a 72 y.o. female who presents for Medicare Annual (Subsequent) preventive examination.  Review of Systems    No ROS.  Medicare Wellness Virtual Visit.  Visual/audio telehealth visit, UTA vital signs.   See social history for additional risk factors.   Cardiac Risk Factors include: advanced age (>50men, >64 women);hypertension     Objective:    Today's Vitals   05/09/22 1406  Weight: 226 lb (102.5 kg)  Height: 5\' 3"  (1.6 m)   Body mass index is 40.03 kg/m.     05/09/2022    2:12 PM 01/15/2022    8:56 PM 05/02/2021    2:17 PM 05/01/2020    3:51 PM 03/11/2019    9:11 AM 09/27/2017   11:07 PM 09/11/2017    7:02 AM  Advanced Directives  Does Patient Have a Medical Advance Directive? No No No Yes Yes Yes Yes  Type of Agricultural consultant;Living will Healthcare Power of [place];Living will Healthcare Power of [place];Living will  Does patient want to make changes to medical advance directive?     No - Patient declined No - Patient declined   Copy of Healthcare Power of Attorney in Chart?     No - copy requested    Would patient like information on creating a medical advance directive? No - Patient declined  No - Patient declined        Current Medications (verified) Outpatient Encounter Medications as of 05/09/2022  Medication Sig   apixaban (ELIQUIS) 5 MG TABS tablet Take 1 tablet (5 mg total) by mouth 2 (two) times daily.   atorvastatin (LIPITOR) 20 MG tablet Take by mouth.   cholecalciferol (VITAMIN D3) 25 MCG (1000 UT) tablet Take 1,000 Units by mouth daily. Taking 2,000mg  daily   hydrochlorothiazide (HYDRODIURIL) 25 MG tablet TAKE 1 TABLET BY MOUTH DAILY   levothyroxine (SYNTHROID) 50 MCG tablet TAKE 1 TABLET BY MOUTH  DAILY   potassium chloride (KLOR-CON) 10 MEQ tablet TAKE 1 TABLET BY MOUTH TWICE  DAILY   sertraline (ZOLOFT) 50 MG tablet TAKE 1 TABLET BY MOUTH DAILY   No facility-administered encounter  medications on file as of 05/09/2022.    Allergies (verified) Ativan [lorazepam], Valium [diazepam], Meloxicam, Trimethoprim, Sulfamethoxazole, and Sulfonamide derivatives   History: Past Medical History:  Diagnosis Date   Anxiety    Arthritis    Cataract    Complication of anesthesia    Woke up during colonoscopy and during Knee surgery. Also says "nitrous makes me crazy"   Family history of adverse reaction to anesthesia    son woke up during TEE   Guillain-Barre 2018   recovered   Hyperlipidemia    Hypertension    Hyperthyroidism    s/p RAI therapy   Hypothyroidism    s/p ablation   Left atrial enlargement    severely enlarged   Migraine headache    2x/yr   Motion sickness    roller coasters   Obesity    Panic disorder    Persistent atrial fibrillation    Snoring    Past Surgical History:  Procedure Laterality Date   ABDOMINAL HYSTERECTOMY     CARDIOVERSION N/A 09/11/2017   Procedure: CARDIOVERSION;  Surgeon: Chilton Si, MD;  Location: [facility] ENDOSCOPY;  Service: Cardiovascular;  Laterality: N/A;   CATARACT EXTRACTION     COLONOSCOPY WITH PROPOFOL N/A 03/11/2019   Procedure: COLONOSCOPY WITH PROPOFOL;  Surgeon: Wyline Mood, MD;  Location: [facility];  Service: Endoscopy;  Laterality: N/A;  colon   KNEE SURGERY Bilateral    replacements   POLYPECTOMY  03/11/2019   Procedure: POLYPECTOMY;  Surgeon: Wyline MoodAnna, Kiran, MD;  Location[facility];  Service: Endoscopy;;   THYROID ABLATION  2010   Family History  Problem Relation Age of Onset   Alzheimer's disease Father    Stroke Mother    Cancer Sister        ovarian    Ovarian cancer Sister [id number]   Breast cancer Neg Hx    Social History   Socioeconomic History   Marital status: Married    Spouse name: Not on file   Number of children: 4   Years of education: Not on file   Highest education level: Not on file  Occupational History    Employer: UNEMPLOYED  Tobacco Use   Smoking status: Never    Smokeless tobacco: Never  Vaping Use   Vaping Use: Never used  Substance and Sexual Activity   Alcohol use: No   Drug use: No   Sexual activity: Not on file  Other Topics Concern   Not on file  Social History Narrative   [place], has 4 children and 15 grandkids         Attends [employer], 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    Writes as a TEFL teachercolumnist for the [employer]   Retired [employer]   Social Determinants of Corporate investment bankerHealth   Financial Resource Strain: Low Risk  (05/09/2022)   Overall Financial Resource Strain (CARDIA)    Difficulty of Paying Living Expenses: Not hard at all  Food Insecurity: No Food Insecurity (05/09/2022)   Hunger Vital Sign    Worried About Running Out of Food in the Last Year: Never true    Ran Out of Food in the Last Year: Never true  Transportation Needs: No Transportation Needs (05/09/2022)   PRAPARE - Administrator, Civil ServiceTransportation    Lack of Transportation (Medical): No    Lack of Transportation (Non-Medical): No  Physical Activity: Sufficiently Active (05/09/2022)   Exercise Vital Sign    Days of Exercise per Week: 5 days    Minutes of Exercise per Session: 30 min  Stress: No Stress Concern Present (05/09/2022)   [employer] - Occupational Stress Questionnaire    Feeling of Stress : Not at all  Social Connections: Unknown (05/09/2022)   Social Connection and Isolation Panel [NHANES]    Frequency of Communication with Friends and Family: Not on file    Frequency of Social Gatherings with Friends and Family: Not on file    Attends Religious Services: Not on file    Active Member of Clubs or Organizations: Not on file    Attends BankerClub or Organization Meetings: Not on file    Marital Status: Married    Tobacco Counseling Counseling given: Not Answered   Clinical Intake:  Pre-visit preparation completed: Yes        Diabetes: No  How often do you need to have someone help you when you read instructions, pamphlets, or other  written materials from your doctor or pharmacy?: 1 - Never    Interpreter Needed?: No      Activities of Daily Living    05/09/2022    2:15 PM  In your present state of health, do you have any difficulty performing the following activities:  Hearing? 0  Vision? 0  Difficulty concentrating or making decisions? 0  Walking or climbing stairs?  1  Comment Paces herself with activity when pain is present.  Dressing or bathing? 0  Doing errands, shopping? 0  Preparing Food and eating ? N  Using the Toilet? N  In the past six months, have you accidently leaked urine? Y  Comment Managed with daily pad  Do you have problems with loss of bowel control? N  Managing your Medications? N  Managing your Finances? N  Housekeeping or managing your Housekeeping? N    Patient Care Team: Dale DurhamScott, Charlene, MD as PCP - General (Internal Medicine) Hillis RangeAllred, James, MD (Inactive) as PCP - Electrophysiology (Cardiology) Hillis RangeAllred, James, MD (Inactive) as PCP - Cardiology (Cardiology)  Indicate any recent Medical Services you may have received from other than [facility] providers in the past year (date may be approximate).     Assessment:   This is a routine wellness examination for Bonnie Shaw.  I connected with  Bonnie Shaw on 05/09/22 by a audio enabled telemedicine application and verified that I am speaking with the correct person using two identifiers.  Patient Location: Home  Provider Location: Office/Clinic  I discussed the limitations of evaluation and management by telemedicine. The patient expressed understanding and agreed to proceed.   Hearing/Vision screen Hearing Screening - Comments:: Patient is able to hear conversational tones without difficulty.  No issues reported.   Vision Screening - Comments:: [facility], Dr. Adele Schilderingledine Cataract extraction, bilateral    Dietary issues and exercise activities discussed: Current Exercise Habits: Home exercise routine, Type of  exercise: stretching (Stretching @ 45min. Gardening.), Time (Minutes): 30, Frequency (Times/Week): 5, Weekly Exercise (Minutes/Week): 150, Intensity: Mild Healthy diet Good water intake   Goals Addressed             This Visit's Progress    Follow up with Primary Care Provider       As needed     Weight (lb) < 200 lb (90.7 kg)   226 lb (102.5 kg)    Weight goal 175lb Reduce sugar intake Stay active       Depression Screen    05/09/2022    2:11 PM 02/04/2022    8:08 AM 01/16/2022    8:14 AM 09/03/2021    1:21 PM 05/02/2021    2:14 PM 04/30/2021    1:14 PM 09/07/2020   11:21 AM  PHQ 2/9 Scores  PHQ - 2 Score 0 0 2 0 0 0 0  PHQ- 9 Score  1 4        Fall Risk    05/09/2022    2:13 PM 03/13/2022   11:42 AM 02/04/2022    8:07 AM 01/16/2022    8:14 AM 11/20/2021   11:07 AM  Fall Risk   Falls in the past year? 1 1 1 1  0  Number falls in past yr: 0 0 0 0 0  Injury with Fall? 1 1 1 1  0  Comment None since last reported to pcp      Risk for fall due to : History of fall(s) History of fall(s) History of fall(s) History of fall(s);Impaired balance/gait No Fall Risks  Follow up Falls evaluation completed;Falls prevention discussed Falls evaluation completed Falls evaluation completed Falls evaluation completed Falls evaluation completed    FALL RISK PREVENTION PERTAINING TO THE HOME: Home free of loose throw rugs in walkways, pet beds, electrical cords, etc? Yes  Adequate lighting in your home to reduce risk of falls? Yes   ASSISTIVE DEVICES UTILIZED TO PREVENT FALLS: Life alert? No  Use of a cane, walker or w/c? No  Grab bars in the bathroom? No  Shower chair or bench in shower? No  Elevated toilet seat or a handicapped toilet? No   TIMED UP AND GO: Was the test performed? No .    Cognitive Function:        05/09/2022    2:22 PM  6CIT Screen  What Year? 0 points  What month? 0 points  What time? 0 points  Count back from 20 0 points  Months in reverse 0 points  Repeat  phrase 0 points  Total Score 0 points    Immunizations Immunization History  Administered Date(s) Administered   Fluad Quad(high Dose 65+) 11/03/2018, 11/02/2019, 11/20/2021   Influenza Split 11/11/2011   Influenza, High Dose Seasonal PF 11/09/2013, 12/18/2014, 02/10/2017, 10/08/2017   Influenza,inj,Quad PF,6+ Mos 11/09/2013, 12/18/2014   Influenza-Unspecified 12/12/2015, 11/03/2020   PFIZER(Purple Top)SARS-COV-2 Vaccination 02/25/2019, 03/18/2019, 11/30/2019   Pneumococcal Conjugate-13 12/18/2014   Pneumococcal Polysaccharide-23 12/19/2015   Tdap 01/15/2022   Covid-19 vaccine status: Completed vaccines x3.   Shingrix Completed?: No.    Education has been provided regarding the importance of this vaccine. Patient has been advised to call insurance company to determine out of pocket expense if they have not yet received this vaccine. Advised may also receive vaccine at local pharmacy or Health Dept. Verbalized acceptance and understanding.  Screening Tests Health Maintenance  Topic Date Due   COLONOSCOPY (Pts 45-3yrs Insurance coverage will need to be confirmed)  03/10/2022   COVID-19 Vaccine (4 - 2023-24 season) 05/25/2022 (Originally 10/04/2021)   Zoster Vaccines- Shingrix (1 of 2) 08/08/2022 (Originally 09/23/1969)   INFLUENZA VACCINE  09/04/2022   MAMMOGRAM  03/30/2023   Medicare Annual Wellness (AWV)  05/09/2023   Pneumonia Vaccine 70+ Years old  Completed   DEXA SCAN  Completed   Hepatitis C Screening  Completed   HPV VACCINES  Aged Out   DTaP/Tdap/Td  Discontinued    Health Maintenance Health Maintenance Due  Topic Date Due   COLONOSCOPY (Pts 45-19yrs Insurance coverage will need to be confirmed)  03/10/2022   Colonoscopy- scheduled 07/2022, Dr. Norma Fredrickson.   Mammogram- deferred per patient preference.  Lung Cancer Screening: (Low Dose CT Chest recommended if Age 53-80 years, 30 pack-year currently smoking OR have quit w/in 15years.) does not qualify.   Hepatitis C  Screening: Completed 09/2017.  Vision Screening: Recommended annual ophthalmology exams for early detection of glaucoma and other disorders of the eye.  Dental Screening: Recommended annual dental exams for proper oral hygiene.  Community Resource Referral / Chronic Care Management: CRR required this visit?  No   CCM required this visit?  No      Plan:   Patient notes she wants to add to her chart: Spoke with Gastroenterology  I have personally reviewed and noted the following in the patient's chart:   Medical and social history Use of alcohol, tobacco or illicit drugs  Current medications and supplements including opioid prescriptions. Patient is not currently taking opioid prescriptions. Functional ability and status Nutritional status Physical activity Advanced directives List of other physicians Hospitalizations, surgeries, and ER visits in previous 12 months Vitals Screenings to include cognitive, depression, and falls Referrals and appointments  In addition, I have reviewed and discussed with patient certain preventive protocols, quality metrics, and best practice recommendations. A written personalized care plan for preventive services as well as general preventive health recommendations were provided to patient.     Cathey Endow, LPN  05/09/2022         

## 2022-07-10 ENCOUNTER — Other Ambulatory Visit: Payer: Medicare Other

## 2022-07-14 ENCOUNTER — Other Ambulatory Visit (INDEPENDENT_AMBULATORY_CARE_PROVIDER_SITE_OTHER): Payer: Medicare Other

## 2022-07-14 DIAGNOSIS — E785 Hyperlipidemia, unspecified: Secondary | ICD-10-CM | POA: Diagnosis not present

## 2022-07-14 DIAGNOSIS — I10 Essential (primary) hypertension: Secondary | ICD-10-CM | POA: Diagnosis not present

## 2022-07-14 DIAGNOSIS — R739 Hyperglycemia, unspecified: Secondary | ICD-10-CM

## 2022-07-14 LAB — BASIC METABOLIC PANEL
BUN: 14 mg/dL (ref 6–23)
CO2: 28 mEq/L (ref 19–32)
Calcium: 9.3 mg/dL (ref 8.4–10.5)
Chloride: 105 mEq/L (ref 96–112)
Creatinine, Ser: 0.81 mg/dL (ref 0.40–1.20)
GFR: 72.33 mL/min (ref >60.00)
Glucose, Bld: 111 mg/dL — ABNORMAL HIGH (ref 70–99)
Potassium: 3.8 mEq/L (ref 3.5–5.1)
Sodium: 142 mEq/L (ref 135–145)

## 2022-07-14 LAB — CBC WITH DIFFERENTIAL/PLATELET
Basophils Absolute: 0 10*3/uL (ref 0.0–0.1)
Basophils Relative: 0.4 % (ref 0.0–3.0)
Eosinophils Absolute: 0.1 10*3/uL (ref 0.0–0.7)
Eosinophils Relative: 1.6 % (ref 0.0–5.0)
HCT: 44 % (ref 36.0–46.0)
Hemoglobin: 14.8 g/dL (ref 12.0–15.0)
Lymphocytes Relative: 39.2 % (ref 12.0–46.0)
Lymphs Abs: 3.5 10*3/uL (ref 0.7–4.0)
MCHC: 33.6 g/dL (ref 30.0–36.0)
MCV: 93.8 fl (ref 78.0–100.0)
Monocytes Absolute: 0.7 10*3/uL (ref 0.1–1.0)
Monocytes Relative: 7.4 % (ref 3.0–12.0)
Neutro Abs: 4.6 10*3/uL (ref 1.4–7.7)
Neutrophils Relative %: 51.4 % (ref 43.0–77.0)
Platelets: 220 10*3/uL (ref 150.0–400.0)
RBC: 4.69 Mil/uL (ref 3.87–5.11)
RDW: 14.1 % (ref 11.5–15.5)
WBC: 9 10*3/uL (ref 4.0–10.5)

## 2022-07-14 LAB — HEPATIC FUNCTION PANEL
ALT: 12 U/L (ref 0–35)
AST: 13 U/L (ref 0–37)
Albumin: 4.1 g/dL (ref 3.5–5.2)
Alkaline Phosphatase: 58 U/L (ref 39–117)
Bilirubin, Direct: 0.2 mg/dL (ref 0.0–0.3)
Total Bilirubin: 1.3 mg/dL — ABNORMAL HIGH (ref 0.2–1.2)
Total Protein: 6.8 g/dL (ref 6.0–8.3)

## 2022-07-14 LAB — HEMOGLOBIN A1C: Hgb A1c MFr Bld: 5.9 % (ref 4.6–6.5)

## 2022-07-14 LAB — LIPID PANEL
Cholesterol: 166 mg/dL (ref 0–200)
HDL: 51.1 mg/dL (ref >39.00)
LDL Cholesterol: 79 mg/dL (ref 0–99)
NonHDL: 114.94
Total CHOL/HDL Ratio: 3
Triglycerides: 181 mg/dL — ABNORMAL HIGH (ref 0.0–149.0)
VLDL: 36.2 mg/dL (ref 0.0–40.0)

## 2022-07-15 ENCOUNTER — Encounter: Payer: Self-pay | Admitting: Internal Medicine

## 2022-07-15 ENCOUNTER — Ambulatory Visit (INDEPENDENT_AMBULATORY_CARE_PROVIDER_SITE_OTHER): Payer: Medicare Other | Admitting: Internal Medicine

## 2022-07-15 VITALS — BP 128/70 | HR 70 | Temp 98.0°F | Resp 16 | Ht 63.0 in | Wt 229.0 lb

## 2022-07-15 DIAGNOSIS — M79646 Pain in unspecified finger(s): Secondary | ICD-10-CM

## 2022-07-15 DIAGNOSIS — I10 Essential (primary) hypertension: Secondary | ICD-10-CM

## 2022-07-15 DIAGNOSIS — Z1211 Encounter for screening for malignant neoplasm of colon: Secondary | ICD-10-CM

## 2022-07-15 DIAGNOSIS — E039 Hypothyroidism, unspecified: Secondary | ICD-10-CM

## 2022-07-15 DIAGNOSIS — D6869 Other thrombophilia: Secondary | ICD-10-CM

## 2022-07-15 DIAGNOSIS — E785 Hyperlipidemia, unspecified: Secondary | ICD-10-CM | POA: Diagnosis not present

## 2022-07-15 DIAGNOSIS — K862 Cyst of pancreas: Secondary | ICD-10-CM

## 2022-07-15 DIAGNOSIS — I4821 Permanent atrial fibrillation: Secondary | ICD-10-CM

## 2022-07-15 DIAGNOSIS — N3946 Mixed incontinence: Secondary | ICD-10-CM

## 2022-07-15 DIAGNOSIS — R739 Hyperglycemia, unspecified: Secondary | ICD-10-CM | POA: Diagnosis not present

## 2022-07-15 DIAGNOSIS — F419 Anxiety disorder, unspecified: Secondary | ICD-10-CM

## 2022-07-15 NOTE — Progress Notes (Signed)
Subjective:    Patient ID: Bonnie Shaw, female    DOB: 08/27/1950, 72 y.o.   MRN: [id number]  Patient here for  Chief Complaint  Patient presents with   Medical Management of Chronic Issues    HPI Here to follow up regarding blood pressure, afib, blood sugar and hypercholesterolemia. Continues on eliquis.  Saw GI 04/08/22. Scheduled f/u MRI abdomen and MRCP - f/u pancreatic cyst. F/u MRI - no change in cyst.  Recommended 2 year f/u MRI.  Discussed MRI results. Also scheduled for f/u colonoscopy - 07/30/22. Discussed holding eliquis for two days.  No chest pain or sob reported.  Does report increased joint pain.  Pain base of thumbs.  Left shoulder pain and bilateral knee pain.  Limits her activity.  Discussed PT.  Wants to hold.  Did agreeable to referral to ortho for pain - base of thumbs.  Also reports issues with her bladder.  At night (mostly), notices after urinating - feels like has to urinate more.  Saw urology previously.  Discussed PFT.    Past Medical History:  Diagnosis Date   Anxiety    Arthritis    Cataract    Complication of anesthesia    Woke up during colonoscopy and during Knee surgery. 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   Hyperlipidemia    Hypertension    Hyperthyroidism    s/p RAI therapy   Hypothyroidism    s/p ablation   Left atrial enlargement    severely enlarged   Migraine headache    2x/yr   Motion sickness    roller coasters   Obesity    Panic disorder    Persistent atrial fibrillation (HCC)    Snoring    Past Surgical History:  Procedure Laterality Date   ABDOMINAL HYSTERECTOMY     CARDIOVERSION N/A 09/11/2017   Procedure: CARDIOVERSION;  Surgeon: Chilton Si, MD;  Location: [facility] ENDOSCOPY;  Service: Cardiovascular;  Laterality: N/A;   CATARACT EXTRACTION     COLONOSCOPY WITH PROPOFOL N/A 03/11/2019   Procedure: COLONOSCOPY  WITH PROPOFOL;  Surgeon: Wyline Mood, MD;  Location: [facility];  Service: Endoscopy;  Laterality: N/A;  colon   KNEE SURGERY Bilateral    replacements   POLYPECTOMY  03/11/2019   Procedure: POLYPECTOMY;  Surgeon: Wyline Mood, MD;  Location: [facility];  Service: Endoscopy;;   THYROID ABLATION  2010   Family History  Problem Relation Age of Onset   Alzheimer's disease Father    Stroke Mother    Cancer Sister        ovarian    Ovarian cancer Sister 22   Breast cancer Neg Hx    Social History   Socioeconomic History   Marital status: Married    Spouse name: Not on file   Number of children: 4   Years of education: Not on file   Highest education level: Not on file  Occupational History    Employer: UNEMPLOYED  Tobacco Use   Smoking status: Never   Smokeless tobacco: Never  Vaping Use   Vaping Use: Never used  Substance and Sexual Activity   Alcohol use: No   Drug use: No   Sexual activity: Not on file  Other Topics Concern   Not on file  Social History Narrative   Lives in [place] with husband, has 4 children  and 15 grandkids         Attends [employer], 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[employer]   Retired [employer]   Social Determinants of [employer]: Low Risk  (05/09/2022)   Overall Financial Resource Strain (CARDIA)    Difficulty of Paying Living Expenses: Not hard at all  Food Insecurity: No Food Insecurity (05/09/2022)   Hunger Vital Sign    Worried About Running Out of Food in the Last Year: Never true    Ran Out of Food in the Last Year: Never true  Transportation Needs: No Transportation Needs (05/09/2022)   PRAPARE - Administrator, Civil Service (Medical): No    Lack of Transportation (Non-Medical): No  Physical Activity: Sufficiently Active (05/09/2022)   Exercise Vital Sign    Days of Exercise per Week: 5 days    Minutes of Exercise per Session: 30 min  Stress: No Stress  Concern Present (05/09/2022)   [employer] of Occupational Health - Occupational Stress Questionnaire    Feeling of Stress : Not at all  Social Connections: Unknown (05/09/2022)   Social Connection and Isolation Panel [NHANES]    Frequency of Communication with Friends and Family: Not on file    Frequency of Social Gatherings with Friends and Family: Not on file    Attends Religious Services: Not on file    Active Member of Clubs or Organizations: Not on file    Attends Banker Meetings: Not on file    Marital Status: Married     Review of Systems  Constitutional:  Negative for appetite change and unexpected weight change.  HENT:  Negative for congestion and sinus pressure.   Respiratory:  Negative for cough, chest tightness and shortness of breath.   Cardiovascular:  Negative for chest pain, palpitations and leg swelling.  Gastrointestinal:  Negative for abdominal pain, diarrhea, nausea and vomiting.  Genitourinary:  Negative for dysuria.       Feels like not emptying bladder fully.   Musculoskeletal:        Knee pain.  Shoulder pain.  Thumb pain as outlined.   Skin:  Negative for color change and rash.  Neurological:  Negative for dizziness and headaches.  Psychiatric/Behavioral:  Negative for agitation and dysphoric mood.        Objective:     BP 128/70   Pulse 70   Temp 98 F (36.7 C)   Resp 16   Ht 5\' 3"  (1.6 m)   Wt 229 lb (103.9 kg)   SpO2 97%   BMI 40.57 kg/m  Wt Readings from Last 3 Encounters:  07/15/22 229 lb (103.9 kg)  05/09/22 226 lb (102.5 kg)  03/13/22 226 lb (102.5 kg)    Physical Exam Vitals reviewed.  Constitutional:      General: She is not in acute distress.    Appearance: Normal appearance.  HENT:     Head: Normocephalic and atraumatic.     Right Ear: External ear normal.     Left Ear: External ear normal.  Eyes:     General: No scleral icterus.       Right eye: No discharge.        Left eye: No discharge.      Conjunctiva/sclera: Conjunctivae normal.  Neck:     Thyroid: No thyromegaly.  Cardiovascular:     Rate and Rhythm: Normal rate and regular rhythm.  Pulmonary:  Effort: No respiratory distress.     Breath sounds: Normal breath sounds. No wheezing.  Abdominal:     General: Bowel sounds are normal.     Palpations: Abdomen is soft.     Tenderness: There is no abdominal tenderness.  Musculoskeletal:        General: No swelling or tenderness.     Cervical back: Neck supple. No tenderness.     Comments: Pain - base of thumbs.   Lymphadenopathy:     Cervical: No cervical adenopathy.  Skin:    Findings: No erythema or rash.  Neurological:     Mental Status: She is alert.  Psychiatric:        Mood and Affect: Mood normal.        Behavior: Behavior normal.      Outpatient Encounter Medications as of 07/15/2022  Medication Sig   apixaban (ELIQUIS) 5 MG TABS tablet Take 1 tablet (5 mg total) by mouth 2 (two) times daily.   atorvastatin (LIPITOR) 20 MG tablet Take by mouth.   cholecalciferol (VITAMIN D3) 25 MCG (1000 UT) tablet Take 1,000 Units by mouth daily. Taking 2,000mg  daily   hydrochlorothiazide (HYDRODIURIL) 25 MG tablet TAKE 1 TABLET BY MOUTH DAILY   levothyroxine (SYNTHROID) 50 MCG tablet TAKE 1 TABLET BY MOUTH  DAILY   potassium chloride (KLOR-CON) 10 MEQ tablet TAKE 1 TABLET BY MOUTH TWICE  DAILY   sertraline (ZOLOFT) 50 MG tablet TAKE 1 TABLET BY MOUTH DAILY   No facility-administered encounter medications on file as of 07/15/2022.     Lab Results  Component Value Date   WBC 9.0 07/14/2022   HGB 14.8 07/14/2022   HCT 44.0 07/14/2022   PLT 220.0 07/14/2022   GLUCOSE 111 (H) 07/14/2022   CHOL 166 07/14/2022   TRIG 181.0 (H) 07/14/2022   HDL 51.10 07/14/2022   LDLDIRECT 97.0 11/01/2018   LDLCALC 79 07/14/2022   ALT 12 07/14/2022   AST 13 07/14/2022   NA 142 07/14/2022   K 3.8 07/14/2022   CL 105 07/14/2022   CREATININE 0.81 07/14/2022   BUN 14 07/14/2022    CO2 28 07/14/2022   TSH 2.35 01/16/2022   INR 1.2 01/15/2022   HGBA1C 5.9 07/14/2022   MICROALBUR <0.7 06/11/2015    MR ABDOMEN MRCP W WO CONTAST  Result Date: 05/06/2022 CLINICAL DATA:  Pancreatic cystic lesion for further characterization EXAM: MRI ABDOMEN WITHOUT AND WITH CONTRAST (INCLUDING MRCP) TECHNIQUE: Multiplanar multisequence MR imaging of the abdomen was performed both before and after the administration of intravenous contrast. Heavily T2-weighted images of the biliary and pancreatic ducts were obtained, and three-dimensional MRCP images were rendered by post processing. CONTRAST:  10mL GADAVIST GADOBUTROL 1 MMOL/ML IV SOLN COMPARISON:  Abdominal ultrasound 02/27/2021 FINDINGS: Despite efforts by the technologist and patient, motion artifact is present on today's exam and could not be eliminated. This reduces exam sensitivity and specificity. Lower chest: Small hiatal hernia.  Mild cardiomegaly. Hepatobiliary: Unremarkable Pancreas: Several tiny "white dot" fluid signal intensity lesions are present along the pancreatic parenchyma. The largest of these is in the pancreatic body and measures 0.5 by 0.3 cm on image 41 series 16. No other pancreatic lesion is identified. The lesions seen on sonography is of uncertain etiology but may have represented a peripancreatic lymph node or similar benign lesion. Careful [place] cell of the arterial phase images demonstrates no compelling evidence of a hypoenhancing mass or other definite infiltrative process. Spleen:  Unremarkable Adrenals/Urinary Tract:  Unremarkable Stomach/Bowel: Unremarkable Vascular/Lymphatic:  Unremarkable Other:  No supplemental non-categorized findings. Musculoskeletal: Levoconvex lumbar scoliosis with lower lumbar spondylosis and degenerative disc disease. IMPRESSION: 1. Several tiny fluid signal intensity lesions in the pancreatic parenchyma, the largest of which measures 0.5 by 0.3 cm in the pancreatic body. Possibilities include  postinflammatory cystic lesions or very small intraductal papillary mucinous neoplasms. Follow up pancreatic protocol MRI is recommended in 2 years time. This recommendation follows ACR consensus guidelines: Management of Incidental Pancreatic Cysts: A White Paper of the ACR Incidental Findings Committee. J Am Coll Radiol 2017;14:911-923. 2. Small hiatal hernia. 3. Levoconvex lumbar scoliosis with lower lumbar spondylosis and degenerative disc disease. Electronically Signed   By: Gaylyn Rong M.D.   On: 05/06/2022 12:50   MR 3D Recon At Scanner  Result Date: 05/06/2022 CLINICAL DATA:  Pancreatic cystic lesion for further characterization EXAM: MRI ABDOMEN WITHOUT AND WITH CONTRAST (INCLUDING MRCP) TECHNIQUE: Multiplanar multisequence MR imaging of the abdomen was performed both before and after the administration of intravenous contrast. Heavily T2-weighted images of the biliary and pancreatic ducts were obtained, and three-dimensional MRCP images were rendered by post processing. CONTRAST:  10mL GADAVIST GADOBUTROL 1 MMOL/ML IV SOLN COMPARISON:  Abdominal ultrasound 02/27/2021 FINDINGS: Despite efforts by the technologist and patient, motion artifact is present on today's exam and could not be eliminated. This reduces exam sensitivity and specificity. Lower chest: Small hiatal hernia.  Mild cardiomegaly. Hepatobiliary: Unremarkable Pancreas: Several tiny "white dot" fluid signal intensity lesions are present along the pancreatic parenchyma. The largest of these is in the pancreatic body and measures 0.5 by 0.3 cm on image 41 series 16. No other pancreatic lesion is identified. The lesions seen on sonography is of uncertain etiology but may have represented a peripancreatic lymph node or similar benign lesion. Careful [place] cell of the arterial phase images demonstrates no compelling evidence of a hypoenhancing mass or other definite infiltrative process. Spleen:  Unremarkable Adrenals/Urinary Tract:   Unremarkable Stomach/Bowel: Unremarkable Vascular/Lymphatic:  Unremarkable Other:  No supplemental non-categorized findings. Musculoskeletal: Levoconvex lumbar scoliosis with lower lumbar spondylosis and degenerative disc disease. IMPRESSION: 1. Several tiny fluid signal intensity lesions in the pancreatic parenchyma, the largest of which measures 0.5 by 0.3 cm in the pancreatic body. Possibilities include postinflammatory cystic lesions or very small intraductal papillary mucinous neoplasms. Follow up pancreatic protocol MRI is recommended in 2 years time. This recommendation follows ACR consensus guidelines: Management of Incidental Pancreatic Cysts: A White Paper of the ACR Incidental Findings Committee. J Am Coll Radiol 2017;14:911-923. 2. Small hiatal hernia. 3. Levoconvex lumbar scoliosis with lower lumbar spondylosis and degenerative disc disease. Electronically Signed   By: Gaylyn Rong M.D.   On: 05/06/2022 12:50       Assessment & Plan:  Hyperglycemia Assessment & Plan: Low carb diet and exercise.  Follow met b and a1c.   Orders: -     Hemoglobin A1c; Future  Hyperlipidemia, unspecified hyperlipidemia type Assessment & Plan: On lipitor.  Low cholesterol diet and exercise.  Follow lipid panel and liver function tests.    Orders: -     Lipid panel; Future -     Hepatic function panel; Future -     Basic metabolic panel; Future  Thumb pain, unspecified laterality Assessment & Plan: Increased pain - base of thumbs.  Request referral to ortho.    Orders: -     Ambulatory referral to Orthopedic Surgery  Permanent atrial fibrillation [facility]) Assessment & Plan: Rate controlled today on exam.  Has been on eliquis. Monitor  for increased heart rate/palpitations.    Acquired thrombophilia (HCC) Assessment & Plan: With afib.  On eliquis.  Follow.    Anxiety Assessment & Plan: Continue zoloft.  Stable.  Follow.     Colon cancer screening Assessment & Plan: Colonoscopy  03/2019.  Recommended f/u in 3 years.  Saw GI. Planning for colonoscopy.  Discussed holding eliquis for two day prior to colonoscopy.  Understands risk of holding medication.  Follow.      Primary hypertension Assessment & Plan: Blood pressure as outlined.  On hctz.  Follow pressures.  Follow metabolic panel.     Hypothyroidism, unspecified type Assessment & Plan: On thyroid replacement.  Follow tsh.    Mixed stress and urge urinary incontinence Assessment & Plan: Urology 12/31/21 - UA today is negative, PVR is minimal, no evidence of underlying pathology. Urinalysis. Bladder Scan (Post Void Residual) in office.  Discussed urinary symptoms.  Discussed referral for PFT.  Will notify me if desires referral.    Pancreatic cyst Assessment & Plan: Scheduled f/u MRI abdomen and MRCP - f/u pancreatic cyst. F/u MRI - no change in cyst.  Recommended 2 year f/u MRI.  Discussed MRI results.       Dale [place], MD

## 2022-07-20 NOTE — Assessment & Plan Note (Signed)
Scheduled f/u MRI abdomen and MRCP - f/u pancreatic cyst. F/u MRI - no change in cyst.  Recommended 2 year f/u MRI.  Discussed MRI results.

## 2022-07-20 NOTE — Assessment & Plan Note (Signed)
Blood pressure as outlined.  On hctz.  Follow pressures.  Follow metabolic panel.   

## 2022-07-20 NOTE — Assessment & Plan Note (Signed)
Low carb diet and exercise.  Follow met b and a1c.   

## 2022-07-20 NOTE — Assessment & Plan Note (Signed)
Colonoscopy 03/2019.  Recommended f/u in 3 years.  Saw GI. Planning for colonoscopy.  Discussed holding eliquis for two day prior to colonoscopy.  Understands risk of holding medication.  Follow.

## 2022-07-20 NOTE — Assessment & Plan Note (Signed)
Increased pain - base of thumbs.  Request referral to ortho.

## 2022-07-20 NOTE — Assessment & Plan Note (Signed)
Rate controlled today on exam.  Has been on eliquis. Monitor for increased heart rate/palpitations.

## 2022-07-20 NOTE — Assessment & Plan Note (Signed)
Urology 12/31/21 - UA today is negative, PVR is minimal, no evidence of underlying pathology. Urinalysis. Bladder Scan (Post Void Residual) in office.  Discussed urinary symptoms.  Discussed referral for PFT.  Will notify me if desires referral.

## 2022-07-20 NOTE — Assessment & Plan Note (Signed)
On lipitor.  Low cholesterol diet and exercise.  Follow lipid panel and liver function tests.   

## 2022-07-20 NOTE — Assessment & Plan Note (Signed)
Continue zoloft.  Stable.  Follow.  

## 2022-07-20 NOTE — Assessment & Plan Note (Signed)
With afib.  On eliquis.  Follow.  

## 2022-07-20 NOTE — Assessment & Plan Note (Signed)
On thyroid replacement.  Follow tsh.  

## 2022-07-23 ENCOUNTER — Encounter: Payer: Self-pay | Admitting: Emergency Medicine

## 2022-07-23 ENCOUNTER — Encounter: Payer: Self-pay | Admitting: Internal Medicine

## 2022-07-23 ENCOUNTER — Other Ambulatory Visit: Payer: Self-pay

## 2022-07-23 ENCOUNTER — Ambulatory Visit
Admission: EM | Admit: 2022-07-23 | Discharge: 2022-07-23 | Disposition: A | Discharge status: Home / Self Care | Payer: Medicare Other | Attending: Emergency Medicine | Admitting: Emergency Medicine

## 2022-07-23 DIAGNOSIS — J029 Acute pharyngitis, unspecified: Secondary | ICD-10-CM | POA: Diagnosis not present

## 2022-07-23 LAB — POCT RAPID STREP A (OFFICE): Rapid Strep A Screen: NEGATIVE

## 2022-07-23 MED ORDER — PREDNISONE 10 MG PO TABS: 40.0000 mg | ORAL_TABLET | Freq: Every day | ORAL | 0 refills | Status: AC

## 2022-07-23 NOTE — ED Provider Notes (Signed)
[name]    CSN: [id number] Arrival date & time: 07/23/22  1255      History   Chief Complaint Chief Complaint  Patient presents with   Sore Throat    HPI Bonnie Shaw is a 72 y.o. female.  Patient presents with sensation of uvula swelling since yesterday but improved today.  She has sore throat, congestion, postnasal drip, and cough since last night.  Treatment attempted with gargling "Alkalol."  No fever, difficulty swallowing, voice change, shortness of breath, or other symptoms.  Patient reports 2 prior episodes of uvula swelling many years ago.  Her medical history includes hypertension, hyperlipidemia, hyperglycemia, acquired thrombophilia, atrial fibrillation, hypothyroidism, pancreatic cyst, obesity.  The history is provided by the patient and medical records.    Past Medical History:  Diagnosis Date   Anxiety    Arthritis    Cataract    Complication of anesthesia    Woke up during colonoscopy and during Knee surgery. 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   Hyperlipidemia    Hypertension    Hyperthyroidism    s/p RAI therapy   Hypothyroidism    s/p ablation   Left atrial enlargement    severely enlarged   Migraine headache    2x/yr   Motion sickness    roller coasters   Obesity    Panic disorder    Persistent atrial fibrillation (HCC)    Snoring     Patient Active Problem List   Diagnosis Date Noted   Thumb pain 07/15/2022   Chest pain 03/13/2022   Urinary urgency 03/13/2022   Diarrhea 02/04/2022   Scalp hematoma 01/16/2022   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/31/2021   Pre-op evaluation 11/20/2021   Bunion 04/30/2021   Pancreatic cyst 04/30/2021   Joint ache 01/29/2021   Acquired thrombophilia (HCC) 07/13/2020   Severe obesity (BMI >= 40) (HCC) 07/13/2020   Change in bowel movement 11/13/2019    Hyperbilirubinemia 11/13/2019   Colon cancer screening 02/20/2019   Near syncope 09/27/2017   Hyperglycemia 04/26/2017   History of Guillain-Barre syndrome 09/06/2016   Orthostatic hypotension 07/09/2016   Vitamin D deficiency 12/30/2015   Healthcare maintenance 09/12/2015   Atrial fibrillation (HCC) 12/02/2013   Anxiety 12/02/2013   Health care maintenance 04/28/2013   Hyperlipidemia 02/23/2012   Fatigue 08/22/2011   Hypothyroidism 02/20/2011   Hypertension 02/20/2011    Past Surgical History:  Procedure Laterality Date   ABDOMINAL HYSTERECTOMY     CARDIOVERSION N/A 09/11/2017   Procedure: CARDIOVERSION;  Surgeon: Chilton Si, MD;  Location: [facility] ENDOSCOPY;  Service: Cardiovascular;  Laterality: N/A;   CATARACT EXTRACTION     COLONOSCOPY WITH PROPOFOL N/A 03/11/2019   Procedure: COLONOSCOPY WITH PROPOFOL;  Surgeon: Wyline Mood, MD;  Location: [facility];  Service: Endoscopy;  Laterality: N/A;  colon   KNEE SURGERY Bilateral    replacements   POLYPECTOMY  03/11/2019   Procedure: POLYPECTOMY;  Surgeon: Wyline Mood, MD;  Location: [facility];  Service: Endoscopy;;   THYROID ABLATION  2010    OB History   No obstetric history on file.      Home Medications    Prior to Admission medications   Medication Sig Start Date End Date Taking? Authorizing Provider  predniSONE (DELTASONE) 10 MG tablet Take 4 tablets (40 mg total) by mouth daily for 3  days. 07/23/22 07/26/22 Yes Mickie Bail, NP  apixaban (ELIQUIS) 5 MG TABS tablet Take 1 tablet (5 mg total) by mouth 2 (two) times daily. 01/16/22   Dale [place], MD  atorvastatin (LIPITOR) 20 MG tablet Take by mouth. 01/02/14   [provider]  cholecalciferol (VITAMIN D3) 25 MCG (1000 UT) tablet Take 1,000 Units by mouth daily. Taking 2,000mg  daily    [provider]  hydrochlorothiazide (HYDRODIURIL) 25 MG tablet TAKE 1 TABLET BY MOUTH DAILY 12/30/21   Dale [place], MD  levothyroxine (SYNTHROID)  50 MCG tablet TAKE 1 TABLET BY MOUTH  DAILY 10/28/21   Dale [place], MD  potassium chloride (KLOR-CON) 10 MEQ tablet TAKE 1 TABLET BY MOUTH TWICE  DAILY 12/13/21   Dale [place], MD  sertraline (ZOLOFT) 50 MG tablet TAKE 1 TABLET BY MOUTH DAILY 12/30/21   Dale [place], MD    Family History Family History  Problem Relation Age of Onset   Alzheimer's disease Father    Stroke Mother    Cancer Sister        ovarian    Ovarian cancer Sister 86   Breast cancer Neg Hx     Social History Social History   Tobacco Use   Smoking status: Never   Smokeless tobacco: Never  Vaping Use   Vaping Use: Never used  Substance Use Topics   Alcohol use: No   Drug use: No     Allergies   Ativan [lorazepam], Valium [diazepam], Meloxicam, Trimethoprim, Sulfamethoxazole, and Sulfonamide derivatives   Review of Systems Review of Systems  Constitutional:  Negative for chills and fever.  HENT:  Positive for congestion, postnasal drip, rhinorrhea and sore throat. Negative for trouble swallowing and voice change.   Respiratory:  Positive for cough. Negative for shortness of breath.   Cardiovascular:  Negative for chest pain and palpitations.     Physical Exam Triage Vital Signs ED Triage Vitals  Enc Vitals Group     BP --      Pulse Rate 07/23/22 1304 84     Resp 07/23/22 1304 18     Temp 07/23/22 1304 97.7 F (36.5 C)     Temp src --      SpO2 07/23/22 1304 96 %     Weight --      Height --      Head Circumference --      Peak Flow --      Pain Score 07/23/22 1308 6     Pain Loc --      Pain Edu? --      Excl. in GC? --    No data found.  Updated Vital Signs BP 127/80 (BP Location: Left Arm) Comment (BP Location): large cuff  Pulse 84   Temp 97.7 F (36.5 C)   Resp 18   SpO2 96%   Visual Acuity Right Eye Distance:   Left Eye Distance:   Bilateral Distance:    Right Eye Near:   Left Eye Near:    Bilateral Near:     Physical Exam Vitals and nursing note  reviewed.  Constitutional:      General: She is not in acute distress.    Appearance: She is well-developed. She is obese. She is not ill-appearing.  HENT:     Head: Normocephalic and atraumatic.     Right Ear: Tympanic membrane normal.     Left Ear: Tympanic membrane normal.     Nose: Rhinorrhea present.     Mouth/Throat:  Mouth: Mucous membranes are moist.     Pharynx: Posterior oropharyngeal erythema present. No uvula swelling.  Cardiovascular:     Rate and Rhythm: Normal rate and regular rhythm.     Heart sounds: Normal heart sounds.  Pulmonary:     Effort: Pulmonary effort is normal. No respiratory distress.     Breath sounds: Normal breath sounds.  Musculoskeletal:     Cervical back: Neck supple.  Skin:    General: Skin is warm and dry.  Neurological:     Mental Status: She is alert.  Psychiatric:        Mood and Affect: Mood normal.        Behavior: Behavior normal.      UC Treatments / Results  Labs (all labs ordered are listed, but only abnormal results are displayed) Labs Reviewed  POCT RAPID STREP A (OFFICE)    EKG   Radiology No results found.  Procedures Procedures (including critical care time)  Medications Ordered in UC Medications - No data to display  Initial Impression / Assessment and Plan / UC Course  I have reviewed the triage vital signs and the nursing notes.  Pertinent labs & imaging results that were available during my care of the patient were reviewed by me and considered in my medical decision making (see chart for details).    Acute pharyngitis.  Afebrile, VSS.  Rapid strep negative.  Patient has sensation that her uvula is swollen but better today.  No obstruction of airway.  911 and ED precautions discussed.  Treating with prednisone x 3 days.  Instructed her to follow up with her PCP tomorrow.  Education provided on pharyngitis.  She agrees to plan of care.   Final Clinical Impressions(s) / UC Diagnoses   Final diagnoses:   Acute pharyngitis, unspecified etiology     Discharge Instructions      Call 911 and go to the emergency department if you feel like you are having difficulty swallowing or breathing.    Take the prednisone as directed.  Follow up with your primary care provider tomorrow.        ED Prescriptions     Medication Sig Dispense Auth. Provider   predniSONE (DELTASONE) 10 MG tablet Take 4 tablets (40 mg total) by mouth daily for 3 days. 12 tablet Mickie Bail, NP      PDMP not reviewed this encounter.   Mickie Bail, NP 07/23/22 1341

## 2022-07-23 NOTE — ED Triage Notes (Signed)
Complains of "uvula" hurting.  Has had a swollen uvula 2 times before.  Patient has had a croupy cough and sneezing, yellow mucus.    Patient has gargled with alkalol

## 2022-07-23 NOTE — Discharge Instructions (Addendum)
Call 911 and go to the emergency department if you feel like you are having difficulty swallowing or breathing.    Take the prednisone as directed.  Follow up with your primary care provider tomorrow.

## 2022-07-29 ENCOUNTER — Encounter: Payer: Self-pay | Admitting: Internal Medicine

## 2022-07-30 ENCOUNTER — Ambulatory Visit: Payer: Medicare Other | Admitting: Certified Registered"

## 2022-07-30 ENCOUNTER — Ambulatory Visit
Admission: RE | Admit: 2022-07-30 | Discharge: 2022-07-30 | Disposition: A | Discharge status: Home / Self Care | Payer: Medicare Other | Attending: Internal Medicine | Admitting: Internal Medicine

## 2022-07-30 ENCOUNTER — Encounter
Admission: RE | Disposition: A | Discharge status: Home / Self Care | Payer: Self-pay | Source: Home / Self Care | Attending: Internal Medicine

## 2022-07-30 DIAGNOSIS — G610 Guillain-Barre syndrome: Secondary | ICD-10-CM | POA: Insufficient documentation

## 2022-07-30 DIAGNOSIS — Z86010 Personal history of colonic polyps: Secondary | ICD-10-CM | POA: Insufficient documentation

## 2022-07-30 DIAGNOSIS — Z6839 Body mass index (BMI) 39.0-39.9, adult: Secondary | ICD-10-CM | POA: Diagnosis not present

## 2022-07-30 DIAGNOSIS — E039 Hypothyroidism, unspecified: Secondary | ICD-10-CM | POA: Diagnosis not present

## 2022-07-30 DIAGNOSIS — K641 Second degree hemorrhoids: Secondary | ICD-10-CM | POA: Insufficient documentation

## 2022-07-30 DIAGNOSIS — Z1211 Encounter for screening for malignant neoplasm of colon: Secondary | ICD-10-CM | POA: Insufficient documentation

## 2022-07-30 DIAGNOSIS — I10 Essential (primary) hypertension: Secondary | ICD-10-CM | POA: Insufficient documentation

## 2022-07-30 DIAGNOSIS — I4819 Other persistent atrial fibrillation: Secondary | ICD-10-CM | POA: Insufficient documentation

## 2022-07-30 DIAGNOSIS — F419 Anxiety disorder, unspecified: Secondary | ICD-10-CM | POA: Insufficient documentation

## 2022-07-30 HISTORY — PX: COLONOSCOPY WITH PROPOFOL: SHX5780

## 2022-07-30 SURGERY — COLONOSCOPY WITH PROPOFOL
Anesthesia: General

## 2022-07-30 MED ORDER — PROPOFOL 10 MG/ML IV BOLUS
INTRAVENOUS | Status: DC | PRN
  Administered 2022-07-30: 70 mg via INTRAVENOUS
  Administered 2022-07-30: 20 mg via INTRAVENOUS

## 2022-07-30 MED ORDER — SEVOFLURANE IN SOLN
RESPIRATORY_TRACT | Status: AC
  Filled 2022-07-30: qty 250

## 2022-07-30 MED ORDER — SODIUM CHLORIDE 0.9 % IV SOLN
INTRAVENOUS | Status: DC
  Administered 2022-07-30: 20 mL/h via INTRAVENOUS

## 2022-07-30 MED ORDER — LIDOCAINE HCL (CARDIAC) PF 100 MG/5ML IV SOSY
PREFILLED_SYRINGE | INTRAVENOUS | Status: DC | PRN
  Administered 2022-07-30: 60 mg via INTRAVENOUS

## 2022-07-30 MED ORDER — PROPOFOL 1000 MG/100ML IV EMUL
INTRAVENOUS | Status: AC
  Filled 2022-07-30: qty 200

## 2022-07-30 MED ORDER — PROPOFOL 500 MG/50ML IV EMUL
INTRAVENOUS | Status: DC | PRN
  Administered 2022-07-30: 100 ug/kg/min via INTRAVENOUS

## 2022-07-30 NOTE — Anesthesia Preprocedure Evaluation (Addendum)
Anesthesia Evaluation  Patient identified by MRN, date of birth, ID band Patient awake    Reviewed: Allergy & Precautions, NPO status , Patient's Chart, lab work & pertinent test results  History of Anesthesia Complications Negative for: history of anesthetic complications  Airway Mallampati: III  TM Distance: >3 FB Neck ROM: Full    Dental no notable dental hx.    Pulmonary    breath sounds clear to auscultation       Cardiovascular hypertension, (-) angina (-) DOE + dysrhythmias (On Eliquis) Atrial Fibrillation  Rhythm:Irregular Rate:Tachycardia   HLD   Neuro/Psych  Headaches PSYCHIATRIC DISORDERS Anxiety      Neuromuscular disease (H/o Guillan-Barre 2018)    GI/Hepatic ,GERD  Controlled,,  Endo/Other  Hypothyroidism  Morbid obesity  Renal/GU      Musculoskeletal  (+) Arthritis ,    Abdominal  (+) + obese (BMI 39)  Peds  Hematology   Anesthesia Other Findings   Reproductive/Obstetrics                             Anesthesia Physical Anesthesia Plan  ASA: III  Anesthesia Plan: General   Post-op Pain Management:    Induction: Intravenous  PONV Risk Score and Plan: 3 and Propofol infusion, TIVA and Treatment may vary due to age or medical condition  Airway Management Planned: Natural Airway and Nasal Cannula  Additional Equipment:   Intra-op Plan:   Post-operative Plan:   Informed Consent: I have reviewed the patients History and Physical, chart, labs and discussed the procedure including the risks, benefits and alternatives for the proposed anesthesia with the patient or authorized representative who has indicated his/her understanding and acceptance.     Dental advisory given  Plan Discussed with: CRNA and Anesthesiologist  Anesthesia Plan Comments:         Anesthesia Quick Evaluation

## 2022-07-30 NOTE — Transfer of Care (Signed)
Immediate Anesthesia Transfer of Care Note  Patient: Bonnie Shaw  Procedure(s) Performed: COLONOSCOPY WITH PROPOFOL  Patient Location: PACU  Anesthesia Type:General  Level of Consciousness: awake, alert , and oriented  Airway & Oxygen Therapy: Patient Spontanous Breathing and Patient connected to nasal cannula oxygen  Post-op Assessment: Report given to RN  Post vital signs: Reviewed  Last Vitals: Dr. Laural Benes in PACU aware of Afib elevated HR.   See PACU flow sheet for VS Vitals Value Taken Time  BP    Temp    Pulse    Resp    SpO2      Last Pain:  Vitals:   07/30/22 0735  TempSrc: Temporal  PainSc: 0-No pain         Complications: No notable events documented.

## 2022-07-30 NOTE — Interval H&P Note (Signed)
History and Physical Interval Note:  07/30/2022 8:14 AM  Bonnie Shaw  has presented today for surgery, with the diagnosis of 211.3 (ICD-9-CM) - D12.6 (ICD-10-CM) - Tubular adenoma of colon.  The various methods of treatment have been discussed with the patient and family. After consideration of risks, benefits and other options for treatment, the patient has consented to  Procedure(s): COLONOSCOPY WITH PROPOFOL (N/A) as a surgical intervention.  The patient's history has been reviewed, patient examined, no change in status, stable for surgery.  I have reviewed the patient's chart and labs.  Questions were answered to the patient's satisfaction.     [place], Nambe

## 2022-07-30 NOTE — H&P (Signed)
Outpatient short stay form Pre-procedure 07/30/2022 8:14 AM Analeigha Nauman K. Norma Fredrickson, M.D.  Primary Physician: Dale [place], M.D.  Reason for visit:  Personal history of adenomatous colon polyp  History of present illness:                            Patient presents for colonoscopy for a personal hx of colon polyps. The patient denies abdominal pain, abnormal weight loss or rectal bleeding.      Current Facility-Administered Medications:    0.9 %  sodium chloride infusion, , Intravenous, Continuous, [place], Boykin Nearing, MD, Last Rate: 20 mL/hr at 07/30/22 0747, 20 mL/hr at 07/30/22 0747  Medications Prior to Admission  Medication Sig Dispense Refill Last Dose   apixaban (ELIQUIS) 5 MG TABS tablet Take 1 tablet (5 mg total) by mouth 2 (two) times daily. 180 tablet 3 Past Week   atorvastatin (LIPITOR) 20 MG tablet Take by mouth.   Past Week   cholecalciferol (VITAMIN D3) 25 MCG (1000 UT) tablet Take 1,000 Units by mouth daily. Taking 2,000mg  daily   Past Week   levothyroxine (SYNTHROID) 50 MCG tablet TAKE 1 TABLET BY MOUTH  DAILY 90 tablet 3 07/30/2022 at 0600   potassium chloride (KLOR-CON) 10 MEQ tablet TAKE 1 TABLET BY MOUTH TWICE  DAILY 180 tablet 3 07/29/2022   sertraline (ZOLOFT) 50 MG tablet TAKE 1 TABLET BY MOUTH DAILY 90 tablet 3 Past Week   hydrochlorothiazide (HYDRODIURIL) 25 MG tablet TAKE 1 TABLET BY MOUTH DAILY 90 tablet 3      Allergies  Allergen Reactions   Ativan [Lorazepam] Other (See Comments)    High sensitivity, cause pt to pass out   Valium [Diazepam] Other (See Comments)    Hallucinations, tremors, slurred speech   Meloxicam Other (See Comments)    Hallucinations   Trimethoprim Other (See Comments)   Sulfamethoxazole Rash   Sulfonamide Derivatives Rash     Past Medical History:  Diagnosis Date   Anxiety    Arthritis    Cataract    Complication of anesthesia    Woke up during colonoscopy and during Knee surgery. Also says "nitrous makes me crazy"   Family  history of adverse reaction to anesthesia    son woke up during TEE   Guillain-Barre (HCC) 2018   recovered   Hyperlipidemia    Hypertension    Hyperthyroidism    s/p RAI therapy   Hypothyroidism    s/p ablation   Left atrial enlargement    severely enlarged   Migraine headache    2x/yr   Motion sickness    roller coasters   Obesity    Panic disorder    Persistent atrial fibrillation (HCC)    Snoring     Review of systems:  Otherwise negative.    Physical Exam  Gen: Alert, oriented. Appears stated age.  HEENT: [place]/AT. PERRLA. Lungs: CTA, no wheezes. CV: RR nl S1, S2. Abd: soft, benign, no masses. BS+ Ext: No edema. Pulses 2+    Planned procedures: Proceed with colonoscopy. The patient understands the nature of the planned procedure, indications, risks, alternatives and potential complications including but not limited to bleeding, infection, perforation, damage to internal organs and possible oversedation/side effects from anesthesia. The patient agrees and gives consent to proceed.  Please refer to procedure notes for findings, recommendations and patient disposition/instructions.     Nahla Lukin K. Norma Fredrickson, M.D. Gastroenterology 07/30/2022  8:14 AM

## 2022-07-30 NOTE — Op Note (Signed)
[facility] Gastroenterology Patient Name: Bonnie Shaw Procedure Date: 07/30/2022 8:16 AM MRN: [id number] Account #: [id number] Date of Birth: 1950-03-27 Admit Type: Outpatient Age: 72 Room: [facility] ENDO ROOM 4 Gender: Female Note Status: Finalized Instrument Name: Prentice Docker [id number] Procedure:             Colonoscopy Indications:           High risk colon cancer surveillance: Personal history                         of non-advanced adenoma Providers:             Boykin Nearing. Norma Fredrickson MD, MD Referring MD:          Dale Smackover, MD (Referring MD) Medicines:             Propofol per Anesthesia Complications:         No immediate complications. Procedure:             Pre-Anesthesia Assessment:                        - The risks and benefits of the procedure and the                         sedation options and risks were discussed with the                         patient. All questions were answered and informed                         consent was obtained.                        - Patient identification and proposed procedure were                         verified prior to the procedure by the nurse. The                         procedure was verified in the procedure room.                        - ASA Grade Assessment: III - A patient with severe                         systemic disease.                        - After reviewing the risks and benefits, the patient                         was deemed in satisfactory condition to undergo the                         procedure.                        After obtaining informed consent, the colonoscope was                         passed under direct  vision. Throughout the procedure,                         the patient's blood pressure, pulse, and oxygen                         saturations were monitored continuously. The                         Colonoscope was introduced through the anus and                         advanced to  the the cecum, identified by appendiceal                         orifice and ileocecal valve. The colonoscopy was                         performed without difficulty. The patient tolerated                         the procedure well. The quality of the bowel                         preparation was excellent. The ileocecal valve,                         appendiceal orifice, and rectum were photographed. Findings:      The perianal exam findings include internal hemorrhoids that prolapse       with straining, but spontaneously regress to the resting position (Grade       II).      Non-bleeding internal hemorrhoids were found during retroflexion. The       hemorrhoids were Grade II (internal hemorrhoids that prolapse but reduce       spontaneously).      The exam was otherwise without abnormality. Impression:            - Internal hemorrhoids that prolapse with straining,                         but spontaneously regress to the resting position                         (Grade II) found on perianal exam.                        - Non-bleeding internal hemorrhoids.                        - The examination was otherwise normal.                        - No specimens collected. Recommendation:        - Patient has a contact number available for                         emergencies. The signs and symptoms of potential  delayed complications were discussed with the patient.                         Return to normal activities tomorrow. Written                         discharge instructions were provided to the patient.                        - Resume previous diet.                        - Continue present medications.                        - No repeat colonoscopy due to current age (36 years                         or older) and the absence of colonic polyps.                        - You do NOT require further colon cancer screening                         measures (Annual  stool testing (i.e. hemoccult, FIT,                         cologuard), sigmoidoscopy, colonoscopy or CT                         colonography). You should share this recommendation                         with your Primary Care provider.                        - Return to GI office PRN.                        - The findings and recommendations were discussed with                         the patient. Procedure Code(s):     --- Professional ---                        A2130, Colorectal cancer screening; colonoscopy on                         individual at high risk Diagnosis Code(s):     --- Professional ---                        K64.1, Second degree hemorrhoids                        Z86.010, Personal history of colonic polyps CPT copyright 2022 American Medical Association. All rights reserved. The codes documented in this report are preliminary and upon coder review may  be revised to meet current compliance requirements. Stanton Kidney MD, MD 07/30/2022 8:34:22 AM This report has been signed electronically.  Number of Addenda: 0 Note Initiated On: 07/30/2022 8:16 AM Scope Withdrawal Time: 0 hours 6 minutes 46 seconds  Total Procedure Duration: 0 hours 10 minutes 54 seconds  Estimated Blood Loss:  Estimated blood loss: none.      [facility]

## 2022-07-31 ENCOUNTER — Encounter: Payer: Self-pay | Admitting: Internal Medicine

## 2022-07-31 NOTE — Anesthesia Postprocedure Evaluation (Signed)
Anesthesia Post Note  Patient: Bonnie Shaw  Procedure(s) Performed: COLONOSCOPY WITH PROPOFOL  Patient location during evaluation: Endoscopy Anesthesia Type: General Level of consciousness: awake and alert Pain management: pain level controlled Vital Signs Assessment: post-procedure vital signs reviewed and stable Respiratory status: spontaneous breathing, nonlabored ventilation and respiratory function stable Cardiovascular status: blood pressure returned to baseline and stable Postop Assessment: no apparent nausea or vomiting Anesthetic complications: no   No notable events documented.   Last Vitals:  Vitals:   07/30/22 0852 07/30/22 0903  BP: 105/80 107/72  Pulse: (!) 121 (!) 106  Resp: (!) 21 20  Temp:    SpO2: 94% 96%    Last Pain:  Vitals:   07/30/22 0903  TempSrc:   PainSc: 0-No pain                 Foye Deer

## 2022-08-01 ENCOUNTER — Ambulatory Visit: Payer: Self-pay

## 2022-08-01 ENCOUNTER — Encounter: Payer: Self-pay | Admitting: Emergency Medicine

## 2022-08-01 ENCOUNTER — Ambulatory Visit
Admission: EM | Admit: 2022-08-01 | Discharge: 2022-08-01 | Disposition: A | Discharge status: Home / Self Care | Payer: Medicare Other | Attending: Emergency Medicine | Admitting: Emergency Medicine

## 2022-08-01 DIAGNOSIS — R051 Acute cough: Secondary | ICD-10-CM

## 2022-08-01 DIAGNOSIS — J0100 Acute maxillary sinusitis, unspecified: Secondary | ICD-10-CM | POA: Diagnosis not present

## 2022-08-01 MED ORDER — AMOXICILLIN 875 MG PO TABS: 875.0000 mg | ORAL_TABLET | Freq: Two times a day (BID) | ORAL | 0 refills | Status: AC

## 2022-08-01 NOTE — Discharge Instructions (Addendum)
Take the amoxicillin as directed.  Follow up with your primary care provider.   ° °

## 2022-08-01 NOTE — ED Provider Notes (Signed)
[name]    CSN: [id number] Arrival date & time: 08/01/22  1508      History   Chief Complaint No chief complaint on file.   HPI Bonnie Shaw is a 72 y.o. female.  Patient presents with ear pain, sore throat, congestion, cough x 10 days.  No fever, shortness of breath, chest pain, or other symptoms.  Treatment attempted with cough syrup.  Patient was seen here on 07/23/2022; diagnosed with acute pharyngitis; treated with prednisone x 3 days due to her sensation of her uvula swelling.  Her medical history includes hypertension, atrial fibrillation, hypothyroidism, obesity.  The history is provided by the patient and medical records.    Past Medical History:  Diagnosis Date   Anxiety    Arthritis    Cataract    Complication of anesthesia    Woke up during colonoscopy and during Knee surgery. 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   Hyperlipidemia    Hypertension    Hyperthyroidism    s/p RAI therapy   Hypothyroidism    s/p ablation   Left atrial enlargement    severely enlarged   Migraine headache    2x/yr   Motion sickness    roller coasters   Obesity    Panic disorder    Persistent atrial fibrillation (HCC)    Snoring     Patient Active Problem List   Diagnosis Date Noted   Thumb pain 07/15/2022   Chest pain 03/13/2022   Urinary urgency 03/13/2022   Diarrhea 02/04/2022   Scalp hematoma 01/16/2022   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/31/2021   Pre-op evaluation 11/20/2021   Bunion 04/30/2021   Pancreatic cyst 04/30/2021   Joint ache 01/29/2021   Acquired thrombophilia (HCC) 07/13/2020   Severe obesity (BMI >= 40) (HCC) 07/13/2020   Change in bowel movement 11/13/2019   Hyperbilirubinemia 11/13/2019   Colon cancer screening 02/20/2019   Near syncope 09/27/2017   Hyperglycemia 04/26/2017   History of Guillain-Barre  syndrome 09/06/2016   Orthostatic hypotension 07/09/2016   Vitamin D deficiency 12/30/2015   Healthcare maintenance 09/12/2015   Atrial fibrillation (HCC) 12/02/2013   Anxiety 12/02/2013   Health care maintenance 04/28/2013   Hyperlipidemia 02/23/2012   Fatigue 08/22/2011   Hypothyroidism 02/20/2011   Hypertension 02/20/2011    Past Surgical History:  Procedure Laterality Date   ABDOMINAL HYSTERECTOMY     CARDIOVERSION N/A 09/11/2017   Procedure: CARDIOVERSION;  Surgeon: Chilton Si, MD;  Location: [facility] ENDOSCOPY;  Service: Cardiovascular;  Laterality: N/A;   CATARACT EXTRACTION     COLONOSCOPY WITH PROPOFOL N/A 03/11/2019   Procedure: COLONOSCOPY WITH PROPOFOL;  Surgeon: Wyline Mood, MD;  Location: [facility];  Service: Endoscopy;  Laterality: N/A;  colon   COLONOSCOPY WITH PROPOFOL N/A 07/30/2022   Procedure: COLONOSCOPY WITH PROPOFOL;  Surgeon: [place], Boykin Nearing, MD;  Location: [facility] ENDOSCOPY;  Service: Gastroenterology;  Laterality: N/A;   KNEE SURGERY Bilateral    replacements   POLYPECTOMY  03/11/2019   Procedure: POLYPECTOMY;  Surgeon: Wyline Mood, MD;  Location: [facility];  Service: Endoscopy;;   THYROID ABLATION  2010    OB History   No obstetric history on file.      Home Medications    Prior to Admission medications   Medication Sig Start Date End Date Taking? Authorizing Provider  amoxicillin (  AMOXIL) 875 MG tablet Take 1 tablet (875 mg total) by mouth 2 (two) times daily for 10 days. 08/01/22 08/11/22 Yes Mickie Bail, NP  apixaban (ELIQUIS) 5 MG TABS tablet Take 1 tablet (5 mg total) by mouth 2 (two) times daily. 01/16/22   Dale [place], MD  atorvastatin (LIPITOR) 20 MG tablet Take by mouth. 01/02/14   [provider]  cholecalciferol (VITAMIN D3) 25 MCG (1000 UT) tablet Take 1,000 Units by mouth daily. Taking 2,000mg  daily    [provider]  hydrochlorothiazide (HYDRODIURIL) 25 MG tablet TAKE 1 TABLET BY MOUTH DAILY  12/30/21   Dale [place], MD  levothyroxine (SYNTHROID) 50 MCG tablet TAKE 1 TABLET BY MOUTH  DAILY 10/28/21   Dale [place], MD  potassium chloride (KLOR-CON) 10 MEQ tablet TAKE 1 TABLET BY MOUTH TWICE  DAILY 12/13/21   Dale [place], MD  sertraline (ZOLOFT) 50 MG tablet TAKE 1 TABLET BY MOUTH DAILY 12/30/21   Dale Arthur, MD    Family History Family History  Problem Relation Age of Onset   Alzheimer's disease Father    Stroke Mother    Cancer Sister        ovarian    Ovarian cancer Sister 21   Breast cancer Neg Hx     Social History Social History   Tobacco Use   Smoking status: Never   Smokeless tobacco: Never  Vaping Use   Vaping Use: Never used  Substance Use Topics   Alcohol use: No   Drug use: No     Allergies   Ativan [lorazepam], Valium [diazepam], Meloxicam, Trimethoprim, Sulfamethoxazole, and Sulfonamide derivatives   Review of Systems Review of Systems  Constitutional:  Negative for chills and fever.  HENT:  Positive for congestion, ear pain and sore throat.   Respiratory:  Positive for cough. Negative for shortness of breath.   Cardiovascular:  Negative for chest pain and palpitations.  Skin:  Negative for color change and rash.  All other systems reviewed and are negative.    Physical Exam Triage Vital Signs ED Triage Vitals [08/01/22 1516]  Enc Vitals Group     BP (!) 141/83     Pulse Rate 99     Resp 18     Temp 98.2 F (36.8 C)     Temp src      SpO2 95 %     Weight      Height      Head Circumference      Peak Flow      Pain Score      Pain Loc      Pain Edu?      Excl. in GC?    No data found.  Updated Vital Signs BP (!) 141/83   Pulse 99   Temp 98.2 F (36.8 C)   Resp 18   SpO2 95%   Visual Acuity Right Eye Distance:   Left Eye Distance:   Bilateral Distance:    Right Eye Near:   Left Eye Near:    Bilateral Near:     Physical Exam Vitals and nursing note reviewed.  Constitutional:      General: She is  not in acute distress.    Appearance: She is well-developed.  HENT:     Right Ear: Tympanic membrane normal.     Left Ear: Tympanic membrane normal.     Nose: Congestion present.     Mouth/Throat:     Mouth: Mucous membranes are moist.  Pharynx: Oropharynx is clear.  Cardiovascular:     Rate and Rhythm: Normal rate and regular rhythm.     Heart sounds: Normal heart sounds.  Pulmonary:     Effort: Pulmonary effort is normal. No respiratory distress.     Breath sounds: Normal breath sounds.  Musculoskeletal:     Cervical back: Neck supple.  Skin:    General: Skin is warm and dry.  Neurological:     Mental Status: She is alert.  Psychiatric:        Mood and Affect: Mood normal.        Behavior: Behavior normal.      UC Treatments / Results  Labs (all labs ordered are listed, but only abnormal results are displayed) Labs Reviewed - No data to display  EKG   Radiology No results found.  Procedures Procedures (including critical care time)  Medications Ordered in UC Medications - No data to display  Initial Impression / Assessment and Plan / UC Course  I have reviewed the triage vital signs and the nursing notes.  Pertinent labs & imaging results that were available during my care of the patient were reviewed by me and considered in my medical decision making (see chart for details).    Acute sinusitis, cough.  Patient has been symptomatic for 10 days.  Treating today with amoxicillin.  Discussed symptomatic treatment including Tylenol.  Instructed patient to follow up with her PCP.  Education provided on sinus infection and cough.  She agrees to plan of care.   Final Clinical Impressions(s) / UC Diagnoses   Final diagnoses:  Acute non-recurrent maxillary sinusitis  Acute cough     Discharge Instructions      Take the amoxicillin as directed.  Follow up with your primary care provider.        ED Prescriptions     Medication Sig Dispense Auth.  Provider   amoxicillin (AMOXIL) 875 MG tablet Take 1 tablet (875 mg total) by mouth 2 (two) times daily for 10 days. 20 tablet Mickie Bail, NP      PDMP not reviewed this encounter.   Mickie Bail, NP 08/01/22 1536

## 2022-09-09 ENCOUNTER — Telehealth: Payer: Self-pay | Admitting: Internal Medicine

## 2022-09-09 DIAGNOSIS — N3946 Mixed incontinence: Secondary | ICD-10-CM

## 2022-09-23 ENCOUNTER — Other Ambulatory Visit: Payer: Self-pay | Admitting: Internal Medicine

## 2022-09-23 ENCOUNTER — Telehealth: Payer: Self-pay | Admitting: Internal Medicine

## 2022-09-23 NOTE — Telephone Encounter (Signed)
Medications filled.  

## 2022-09-23 NOTE — Telephone Encounter (Signed)
Please confirm pharmacy is wanting to change there thyroid medication to a different manufacturer.  No other change in dose.  If this is correct, please call pt and confirm she is ok with change in manufacturer.

## 2022-09-23 NOTE — Telephone Encounter (Signed)
Cindy from [facility] called stating they need approval for atorvastatin

## 2022-09-23 NOTE — Telephone Encounter (Signed)
The pharmacy just called Need doctors approval to myoam to [facility]og[facility]. They sent a fax to Dr. Lorin Picket. They are changing the name the prescription is levothyroxine (SYNTHROID) 50 MCG tablet.

## 2022-09-24 NOTE — Telephone Encounter (Signed)
See attached.  If they are just wanting to know if they can change manufacturer of her thyroid medication - notify - ok to change.

## 2022-09-24 NOTE — Telephone Encounter (Signed)
Pt stated she was fine with the change

## 2022-09-25 ENCOUNTER — Other Ambulatory Visit: Payer: Self-pay | Admitting: *Deleted

## 2022-09-25 MED ORDER — LEVOTHYROXINE SODIUM 50 MCG PO TABS: 50.0000 ug | ORAL_TABLET | Freq: Every day | ORAL | 3 refills | Status: DC

## 2022-09-25 NOTE — Telephone Encounter (Signed)
Resent prescription with note that manufacturer change is approved by provider and patient.

## 2022-10-08 ENCOUNTER — Ambulatory Visit
Admission: EM | Admit: 2022-10-08 | Discharge: 2022-10-08 | Disposition: A | Discharge status: Home / Self Care | Payer: Medicare Other | Attending: Emergency Medicine | Admitting: Emergency Medicine

## 2022-10-08 ENCOUNTER — Ambulatory Visit: Payer: Self-pay

## 2022-10-08 DIAGNOSIS — B349 Viral infection, unspecified: Secondary | ICD-10-CM

## 2022-10-08 DIAGNOSIS — Z20822 Contact with and (suspected) exposure to covid-19: Secondary | ICD-10-CM

## 2022-10-08 LAB — SARS CORONAVIRUS 2 (TAT 6-24 HRS): SARS Coronavirus 2: POSITIVE — AB

## 2022-10-08 MED ORDER — PROMETHAZINE-DM 6.25-15 MG/5ML PO SYRP: 5.0000 mL | ORAL_SOLUTION | Freq: Three times a day (TID) | ORAL | 0 refills | Status: DC | PRN

## 2022-10-08 NOTE — ED Triage Notes (Signed)
Patient to Urgent Care with complaints of cough/ headaches/ generalized body aches/ dizziness/ poor appetite.   Symptoms started Monday night. Husband sick w/ same symptoms.  Has an old promethazine-DM prescription that has been providing some relief.

## 2022-10-08 NOTE — Discharge Instructions (Addendum)
Push fluids. Check my chart for covid results.  The recommendations suggest returning to normal activities when, for at least 24 hours, symptoms are improving overall, and if a fever was present, it has been gone without use of a fever-reducing medication.  Once people resume normal activities, they are encouraged to take additional prevention strategies for the next 5 days to curb disease spread, such as taking more steps for cleaner air, enhancing hygiene practices, wearing a well-fitting mask, keeping a distance from others, and/or getting tested for respiratory viruses  If you have shortness of breath,chest pain,palpitations, got to ER for further evaluation.

## 2022-10-08 NOTE — ED Notes (Signed)
Patient requests that her husband's phone number- Bonnie Shaw [phone] be contacted if her Covid test results are positive.

## 2022-10-08 NOTE — ED Provider Notes (Signed)
Bonnie Shaw    CSN: [id number] Arrival date & time: 10/08/22  1030      History   Chief Complaint Chief Complaint  Patient presents with   Cough   Nasal Congestion   Headache    HPI Bonnie Shaw is a 72 y.o. female.   72 year old female, Bonnie Shaw, presents to urgent care for evaluation of cough,headaches, body aches x 2 days, husband has same symptoms. Taking old med for symptom relief: Pt requesting refill on phenergan DM for cough as it worked for symptoms and is out. Pt is getting ready for family wedding. +Covid exposure.  The history is provided by the patient. No language interpreter was used.    Past Medical History:  Diagnosis Date   Anxiety    Arthritis    Cataract    Complication of anesthesia    Woke up during colonoscopy and during Knee surgery. 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   Hyperlipidemia    Hypertension    Hyperthyroidism    s/p RAI therapy   Hypothyroidism    s/p ablation   Left atrial enlargement    severely enlarged   Migraine headache    2x/yr   Motion sickness    roller coasters   Obesity    Panic disorder    Persistent atrial fibrillation (HCC)    Snoring     Patient Active Problem List   Diagnosis Date Noted   Viral illness 10/08/2022   Close exposure to COVID-19 virus 10/08/2022   Thumb pain 07/15/2022   Chest pain 03/13/2022   Urinary urgency 03/13/2022   Diarrhea 02/04/2022   Scalp hematoma 01/16/2022   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/31/2021   Pre-op evaluation 11/20/2021   Bunion 04/30/2021   Pancreatic cyst 04/30/2021   Joint ache 01/29/2021   Acquired thrombophilia (HCC) 07/13/2020   Severe obesity (BMI >= 40) (HCC) 07/13/2020   Change in bowel movement 11/13/2019   Hyperbilirubinemia 11/13/2019   Colon cancer screening 02/20/2019   Near syncope 09/27/2017    Hyperglycemia 04/26/2017   History of Guillain-Barre syndrome 09/06/2016   Orthostatic hypotension 07/09/2016   Vitamin D deficiency 12/30/2015   Healthcare maintenance 09/12/2015   Atrial fibrillation (HCC) 12/02/2013   Anxiety 12/02/2013   Health care maintenance 04/28/2013   Hyperlipidemia 02/23/2012   Fatigue 08/22/2011   Hypothyroidism 02/20/2011   Hypertension 02/20/2011    Past Surgical History:  Procedure Laterality Date   ABDOMINAL HYSTERECTOMY     CARDIOVERSION N/A 09/11/2017   Procedure: CARDIOVERSION;  Surgeon: Chilton Si, MD;  Location: [facility] ENDOSCOPY;  Service: Cardiovascular;  Laterality: N/A;   CATARACT EXTRACTION     COLONOSCOPY WITH PROPOFOL N/A 03/11/2019   Procedure: COLONOSCOPY WITH PROPOFOL;  Surgeon: Wyline Mood, MD;  Location: [facility];  Service: Endoscopy;  Laterality: N/A;  colon   COLONOSCOPY WITH PROPOFOL N/A 07/30/2022   Procedure: COLONOSCOPY WITH PROPOFOL;  Surgeon: Toledo, Boykin Nearing, MD;  Location: [facility] ENDOSCOPY;  Service: Gastroenterology;  Laterality: N/A;   KNEE SURGERY Bilateral    replacements   POLYPECTOMY  03/11/2019   Procedure: POLYPECTOMY;  Surgeon: Wyline Mood, MD;  Location: [facility];  Service: Endoscopy;;   THYROID ABLATION  2010    OB History   No obstetric history on file.      Home Medications  Prior to Admission medications   Medication Sig Start Date End Date Taking? Authorizing Provider  promethazine-dextromethorphan (PROMETHAZINE-DM) 6.25-15 MG/5ML syrup Take 5 mLs by mouth every 8 (eight) hours as needed for cough. 10/08/22  Yes Lindzee Gouge, Para March, NP  apixaban (ELIQUIS) 5 MG TABS tablet Take 1 tablet (5 mg total) by mouth 2 (two) times daily. 01/16/22   Dale [place], MD  atorvastatin (LIPITOR) 20 MG tablet TAKE 1 TABLET BY MOUTH ONCE  DAILY 09/12/22   Dale [place], MD  cholecalciferol (VITAMIN D3) 25 MCG (1000 UT) tablet Take 1,000 Units by mouth daily. Taking 2,000mg  daily    [provider]  hydrochlorothiazide (HYDRODIURIL) 25 MG tablet TAKE 1 TABLET BY MOUTH DAILY 09/23/22   Dale Bertrand, MD  levothyroxine (SYNTHROID) 50 MCG tablet Take 1 tablet (50 mcg total) by mouth daily. 09/25/22   Dale [place], MD  potassium chloride (KLOR-CON) 10 MEQ tablet TAKE 1 TABLET BY MOUTH TWICE  DAILY 09/23/22   Dale [place], MD  sertraline (ZOLOFT) 50 MG tablet TAKE 1 TABLET BY MOUTH DAILY 09/23/22   Dale , MD    Family History Family History  Problem Relation Age of Onset   Alzheimer's disease Father    Stroke Mother    Cancer Sister        ovarian    Ovarian cancer Sister 75   Breast cancer Neg Hx     Social History Social History   Tobacco Use   Smoking status: Never   Smokeless tobacco: Never  Vaping Use   Vaping status: Never Used  Substance Use Topics   Alcohol use: No   Drug use: No     Allergies   Ativan [lorazepam], Valium [diazepam], Meloxicam, Trimethoprim, Sulfamethoxazole, and Sulfonamide derivatives   Review of Systems Review of Systems  Constitutional:  Positive for appetite change.  Respiratory:  Positive for cough. Negative for chest tightness.   Cardiovascular:  Negative for chest pain and palpitations.  Musculoskeletal:  Positive for myalgias.  Neurological:  Positive for light-headedness and headaches.  All other systems reviewed and are negative.    Physical Exam Triage Vital Signs ED Triage Vitals  Encounter Vitals Group     BP 10/08/22 1053 (!) 143/90     Systolic BP Percentile --      Diastolic BP Percentile --      Pulse Rate 10/08/22 1053 70     Resp 10/08/22 1053 19     Temp 10/08/22 1053 99.4 F (37.4 C)     Temp Source 10/08/22 1053 Oral     SpO2 10/08/22 1053 95 %     Weight --      Height 10/08/22 1058 5\' 3"  (1.6 m)     Head Circumference --      Peak Flow --      Pain Score 10/08/22 1054 4     Pain Loc --      Pain Education --      Exclude from Growth Chart --    No data found.  Updated  Vital Signs BP (!) 143/90 (BP Location: Left Arm)   Pulse 70   Temp 99.4 F (37.4 C) (Oral)   Resp 19   Ht 5\' 3"  (1.6 m)   SpO2 95%   BMI 40.14 kg/m   Visual Acuity Right Eye Distance:   Left Eye Distance:   Bilateral Distance:    Right Eye Near:   Left Eye Near:    Bilateral Near:     Physical Exam  Vitals and nursing note reviewed.  Cardiovascular:     Rate and Rhythm: Normal rate and regular rhythm.     Pulses: Normal pulses.     Heart sounds: Normal heart sounds.  Pulmonary:     Effort: Pulmonary effort is normal.     Breath sounds: Normal breath sounds and air entry.  Neurological:     General: No focal deficit present.     Mental Status: She is alert and oriented to person, place, and time.     GCS: GCS eye subscore is 4. GCS verbal subscore is 5. GCS motor subscore is 6.  Psychiatric:        Attention and Perception: Attention normal.        Mood and Affect: Mood normal.        Speech: Speech normal.        Behavior: Behavior normal.      UC Treatments / Results  Labs (all labs ordered are listed, but only abnormal results are displayed) Labs Reviewed  SARS CORONAVIRUS 2 (TAT 6-24 HRS)    EKG   Radiology No results found.  Procedures Procedures (including critical care time)  Medications Ordered in UC Medications - No data to display  Initial Impression / Assessment and Plan / UC Course  I have reviewed the triage vital signs and the nursing notes.  Pertinent labs & imaging results that were available during my care of the patient were reviewed by me and considered in my medical decision making (see chart for details).    Discussed exam findings and plan of care with patient, strict go to ER precautions given.   Patient verbalized understanding to this provider. Discussed with pt that due to eloquis and atorvastin contraindicated Paxlovid, pt does not wish to pursue additional antiviral medications at this time. Strict go to Er precautions  given.   Ddx: Covid 19, Viral illness,URI Final Clinical Impressions(s) / UC Diagnoses   Final diagnoses:  Viral illness  Close exposure to COVID-19 virus     Discharge Instructions      Push fluids. Check my chart for covid results.  The recommendations suggest returning to normal activities when, for at least 24 hours, symptoms are improving overall, and if a fever was present, it has been gone without use of a fever-reducing medication.  Once people resume normal activities, they are encouraged to take additional prevention strategies for the next 5 days to curb disease spread, such as taking more steps for cleaner air, enhancing hygiene practices, wearing a well-fitting mask, keeping a distance from others, and/or getting tested for respiratory viruses  If you have shortness of breath,chest pain,palpitations, got to ER for further evaluation.     ED Prescriptions     Medication Sig Dispense Auth. Provider   promethazine-dextromethorphan (PROMETHAZINE-DM) 6.25-15 MG/5ML syrup Take 5 mLs by mouth every 8 (eight) hours as needed for cough. 118 mL [place]hton Sabine, Para March, NP      PDMP not reviewed this encounter.   Clancy Gourd, NP 10/08/22 1753

## 2022-10-21 ENCOUNTER — Ambulatory Visit (INDEPENDENT_AMBULATORY_CARE_PROVIDER_SITE_OTHER): Payer: Medicare Other

## 2022-10-21 ENCOUNTER — Ambulatory Visit
Admission: RE | Admit: 2022-10-21 | Discharge: 2022-10-21 | Disposition: A | Discharge status: Home / Self Care | Payer: Medicare Other | Source: Ambulatory Visit | Attending: Emergency Medicine | Admitting: Emergency Medicine

## 2022-10-21 ENCOUNTER — Telehealth: Payer: Self-pay | Admitting: Internal Medicine

## 2022-10-21 VITALS — BP 159/98 | HR 95 | Temp 98.0°F | Resp 18

## 2022-10-21 DIAGNOSIS — R051 Acute cough: Secondary | ICD-10-CM

## 2022-10-21 DIAGNOSIS — R0602 Shortness of breath: Secondary | ICD-10-CM

## 2022-10-21 DIAGNOSIS — U071 COVID-19: Secondary | ICD-10-CM | POA: Diagnosis not present

## 2022-10-21 DIAGNOSIS — J189 Pneumonia, unspecified organism: Secondary | ICD-10-CM

## 2022-10-21 MED ORDER — AMOXICILLIN-POT CLAVULANATE 875-125 MG PO TABS: 1.0000 | ORAL_TABLET | Freq: Two times a day (BID) | ORAL | 0 refills | Status: DC

## 2022-10-21 MED ORDER — AZITHROMYCIN 250 MG PO TABS: 250.0000 mg | ORAL_TABLET | Freq: Every day | ORAL | 0 refills | Status: DC

## 2022-10-21 NOTE — ED Triage Notes (Addendum)
Patient to Urgent Care with complaints of worsening and productive cough. States she has to sleep sitting up. [facility] w/ exertion. Reports discolored mucus production.  Reports being Covid positive 9/4 and cough/ low energy levels have lingered since.   Symptoms worsened over the last three days ago. No otc meds.

## 2022-10-21 NOTE — Telephone Encounter (Signed)
Pt called stating she would like to make an appointment with Lorin Picket for a follow-up from the ED but there are no appointments available at the moment. I offered pt another appointment with another provider but she only wanted to see Lorin Picket because she knows her better

## 2022-10-21 NOTE — Discharge Instructions (Addendum)
Follow up with your primary care provider tomorrow.  Go to the emergency department if you have worsening symptoms.    Take the Augmentin and Zithromax as directed.

## 2022-10-21 NOTE — ED Provider Notes (Signed)
Bonnie Shaw    CSN: [id number] Arrival date & time: 10/21/22  1020      History   Chief Complaint Chief Complaint  Patient presents with   Cough    Cough wheezing - Entered by patient    HPI Bonnie Shaw is a 73 y.o. female.  Patient presents with productive cough and shortness of breath with exertion.  Her cough is productive of cream-colored phlegm.  She also reports chest congestion and postnasal drip.  She was diagnosed with COVID on 10/08/2022.  She denies fever, chest pain, or other symptoms.  Patient was seen at this urgent care on 10/08/2022; diagnosed with viral illness and exposure to COVID; treated with Promethazine DM; COVID test positive.  Patient was seen here on 08/01/2022; diagnosed with sinusitis and treated with amoxicillin.  The history is provided by the patient and medical records.    Past Medical History:  Diagnosis Date   Anxiety    Arthritis    Cataract    Complication of anesthesia    Woke up during colonoscopy and during Knee surgery. 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   Hyperlipidemia    Hypertension    Hyperthyroidism    s/p RAI therapy   Hypothyroidism    s/p ablation   Left atrial enlargement    severely enlarged   Migraine headache    2x/yr   Motion sickness    roller coasters   Obesity    Panic disorder    Persistent atrial fibrillation (HCC)    Snoring     Patient Active Problem List   Diagnosis Date Noted   Viral illness 10/08/2022   Close exposure to COVID-19 virus 10/08/2022   Thumb pain 07/15/2022   Chest pain 03/13/2022   Urinary urgency 03/13/2022   Diarrhea 02/04/2022   Scalp hematoma 01/16/2022   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/31/2021   Pre-op evaluation 11/20/2021   Bunion 04/30/2021   Pancreatic cyst 04/30/2021   Joint ache 01/29/2021   Acquired thrombophilia (HCC)  07/13/2020   Severe obesity (BMI >= 40) (HCC) 07/13/2020   Change in bowel movement 11/13/2019   Hyperbilirubinemia 11/13/2019   Colon cancer screening 02/20/2019   Near syncope 09/27/2017   Hyperglycemia 04/26/2017   History of Guillain-Barre syndrome 09/06/2016   Orthostatic hypotension 07/09/2016   Vitamin D deficiency 12/30/2015   Healthcare maintenance 09/12/2015   Atrial fibrillation (HCC) 12/02/2013   Anxiety 12/02/2013   Health care maintenance 04/28/2013   Hyperlipidemia 02/23/2012   Fatigue 08/22/2011   Hypothyroidism 02/20/2011   Hypertension 02/20/2011    Past Surgical History:  Procedure Laterality Date   ABDOMINAL HYSTERECTOMY     CARDIOVERSION N/A 09/11/2017   Procedure: CARDIOVERSION;  Surgeon: Chilton Si, MD;  Location: [facility] ENDOSCOPY;  Service: Cardiovascular;  Laterality: N/A;   CATARACT EXTRACTION     COLONOSCOPY WITH PROPOFOL N/A 03/11/2019   Procedure: COLONOSCOPY WITH PROPOFOL;  Surgeon: Wyline Mood, MD;  Location: [facility];  Service: Endoscopy;  Laterality: N/A;  colon   COLONOSCOPY WITH PROPOFOL N/A 07/30/2022   Procedure: COLONOSCOPY WITH PROPOFOL;  Surgeon: Toledo, Boykin Nearing, MD;  Location: [facility] ENDOSCOPY;  Service: Gastroenterology;  Laterality: N/A;   KNEE SURGERY Bilateral    replacements   POLYPECTOMY  03/11/2019   Procedure: POLYPECTOMY;  Surgeon: Wyline Mood, MD;  Location: [facility];  Service: Endoscopy;;   THYROID ABLATION  2010    OB History   No obstetric history on file.      Home Medications    Prior to Admission medications   Medication Sig Start Date End Date Taking? Authorizing Provider  amoxicillin-clavulanate (AUGMENTIN) 875-125 MG tablet Take 1 tablet by mouth every 12 (twelve) hours. 10/21/22  Yes Mickie Bail, NP  azithromycin (ZITHROMAX) 250 MG tablet Take 1 tablet (250 mg total) by mouth daily. Take first 2 tablets together, then 1 every day until finished. 10/21/22  Yes Mickie Bail, NP  apixaban  (ELIQUIS) 5 MG TABS tablet Take 1 tablet (5 mg total) by mouth 2 (two) times daily. 01/16/22   Dale [place], MD  atorvastatin (LIPITOR) 20 MG tablet TAKE 1 TABLET BY MOUTH ONCE  DAILY 09/12/22   Dale [place], MD  cholecalciferol (VITAMIN D3) 25 MCG (1000 UT) tablet Take 1,000 Units by mouth daily. Taking 2,000mg  daily    [provider]  hydrochlorothiazide (HYDRODIURIL) 25 MG tablet TAKE 1 TABLET BY MOUTH DAILY 09/23/22   Dale [place], MD  levothyroxine (SYNTHROID) 50 MCG tablet Take 1 tablet (50 mcg total) by mouth daily. 09/25/22   Dale [place], MD  potassium chloride (KLOR-CON) 10 MEQ tablet TAKE 1 TABLET BY MOUTH TWICE  DAILY 09/23/22   Dale [place], MD  promethazine-dextromethorphan (PROMETHAZINE-DM) 6.25-15 MG/5ML syrup Take 5 mLs by mouth every 8 (eight) hours as needed for cough. Patient not taking: Reported on 10/21/2022 10/08/22   Defelice, Para March, NP  sertraline (ZOLOFT) 50 MG tablet TAKE 1 TABLET BY MOUTH DAILY 09/23/22   Dale , MD    Family History Family History  Problem Relation Age of Onset   Alzheimer's disease Father    Stroke Mother    Cancer Sister        ovarian    Ovarian cancer Sister 66   Breast cancer Neg Hx     Social History Social History   Tobacco Use   Smoking status: Never   Smokeless tobacco: Never  Vaping Use   Vaping status: Never Used  Substance Use Topics   Alcohol use: No   Drug use: No     Allergies   Ativan [lorazepam], Valium [diazepam], Meloxicam, Trimethoprim, Sulfamethoxazole, and Sulfonamide derivatives   Review of Systems Review of Systems  Constitutional:  Negative for chills and fever.  HENT:  Positive for congestion and postnasal drip. Negative for ear pain and sore throat.   Respiratory:  Positive for cough and shortness of breath.   Cardiovascular:  Negative for chest pain and palpitations.  All other systems reviewed and are negative.    Physical Exam Triage Vital Signs ED Triage Vitals   Encounter Vitals Group     BP 10/21/22 1034 (!) 147/65     Systolic BP Percentile --      Diastolic BP Percentile --      Pulse Rate 10/21/22 1027 95     Resp 10/21/22 1027 18     Temp 10/21/22 1027 98 F (36.7 C)     Temp src --      SpO2 10/21/22 1027 96 %     Weight --      Height --      Head Circumference --      Peak Flow --      Pain Score 10/21/22 1029 0     Pain Loc --      Pain Education --      Exclude from  Growth Chart --    No data found.  Updated Vital Signs BP (!) 159/98   Pulse 95   Temp 98 F (36.7 C)   Resp 18   SpO2 96%   Visual Acuity Right Eye Distance:   Left Eye Distance:   Bilateral Distance:    Right Eye Near:   Left Eye Near:    Bilateral Near:     Physical Exam Constitutional:      General: She is not in acute distress.    Appearance: She is obese.  HENT:     Right Ear: Tympanic membrane normal.     Left Ear: Tympanic membrane normal.     Nose: Nose normal.     Mouth/Throat:     Mouth: Mucous membranes are moist.     Pharynx: Oropharynx is clear.  Cardiovascular:     Rate and Rhythm: Normal rate and regular rhythm.     Heart sounds: Normal heart sounds.  Pulmonary:     Effort: Pulmonary effort is normal. No respiratory distress.     Breath sounds: Rhonchi present.  Skin:    General: Skin is warm and dry.  Neurological:     Mental Status: She is alert.  Psychiatric:        Mood and Affect: Mood normal.        Behavior: Behavior normal.      UC Treatments / Results  Labs (all labs ordered are listed, but only abnormal results are displayed) Labs Reviewed - No data to display  EKG   Radiology DG Chest 2 View  Result Date: 10/21/2022 CLINICAL DATA:  Cough, shortness of breath. EXAM: CHEST - 2 VIEW COMPARISON:  Chest radiographs 01/15/2022. FINDINGS: Bilateral reticular opacities with more nodular appearance in the right mid lung are favored to be infectious or inflammatory in etiology. Stable cardiac and mediastinal  contours. No pleural effusion or pneumothorax. IMPRESSION: Bilateral reticular opacities with more nodular appearance in the right mid lung are favored to be infectious or inflammatory in etiology. Electronically Signed   By: Orvan Falconer M.D.   On: 10/21/2022 12:44    Procedures Procedures (including critical care time)  Medications Ordered in UC Medications - No data to display  Initial Impression / Assessment and Plan / UC Course  I have reviewed the triage vital signs and the nursing notes.  Pertinent labs & imaging results that were available during my care of the patient were reviewed by me and considered in my medical decision making (see chart for details).    COVID-19, cough, shortness of breath.  CXR shows right middle lobe pneumonia.  O2 sat 96% on room air.  No respiratory distress.  Treating with Augmentin and Zithromax.  Instructed patient to follow-up with her PCP tomorrow.  ED precautions given.  Education provided on COVID-19, shortness of breath, and pneumonia.  She agrees to plan of care.   Final Clinical Impressions(s) / UC Diagnoses   Final diagnoses:  COVID-19  Acute cough  Shortness of breath  Pneumonia of right middle lobe due to infectious organism     Discharge Instructions      Follow up with your primary care provider tomorrow.  Go to the emergency department if you have worsening symptoms.    Take the Augmentin and Zithromax as directed.        ED Prescriptions     Medication Sig Dispense Auth. Provider   amoxicillin-clavulanate (AUGMENTIN) 875-125 MG tablet Take 1 tablet by mouth every 12 (twelve) hours.  14 tablet Mickie Bail, NP   azithromycin (ZITHROMAX) 250 MG tablet Take 1 tablet (250 mg total) by mouth daily. Take first 2 tablets together, then 1 every day until finished. 6 tablet Mickie Bail, NP      PDMP not reviewed this encounter.   Mickie Bail, NP 10/21/22 1312

## 2022-10-22 NOTE — Telephone Encounter (Signed)
Called patient to follow up. She says she is feeling much better. She has a scheduled follow up beginning of October and would like to keep scheduled. Will Call if she feels she needs to be seen sooner.

## 2022-11-11 ENCOUNTER — Other Ambulatory Visit (INDEPENDENT_AMBULATORY_CARE_PROVIDER_SITE_OTHER): Payer: Medicare Other

## 2022-11-11 DIAGNOSIS — E785 Hyperlipidemia, unspecified: Secondary | ICD-10-CM | POA: Diagnosis not present

## 2022-11-11 DIAGNOSIS — R739 Hyperglycemia, unspecified: Secondary | ICD-10-CM

## 2022-11-11 LAB — BASIC METABOLIC PANEL
BUN: 12 mg/dL (ref 6–23)
CO2: 28 meq/L (ref 19–32)
Calcium: 9.3 mg/dL (ref 8.4–10.5)
Chloride: 103 meq/L (ref 96–112)
Creatinine, Ser: 0.79 mg/dL (ref 0.40–1.20)
GFR: 74.36 mL/min (ref >60.00)
Glucose, Bld: 120 mg/dL — ABNORMAL HIGH (ref 70–99)
Potassium: 3.4 meq/L — ABNORMAL LOW (ref 3.5–5.1)
Sodium: 141 meq/L (ref 135–145)

## 2022-11-11 LAB — LIPID PANEL
Cholesterol: 170 mg/dL (ref 0–200)
HDL: 51.3 mg/dL (ref >39.00)
LDL Cholesterol: 83 mg/dL (ref 0–99)
NonHDL: 119.02
Total CHOL/HDL Ratio: 3
Triglycerides: 181 mg/dL — ABNORMAL HIGH (ref 0.0–149.0)
VLDL: 36.2 mg/dL (ref 0.0–40.0)

## 2022-11-11 LAB — HEPATIC FUNCTION PANEL
ALT: 12 U/L (ref 0–35)
AST: 12 U/L (ref 0–37)
Albumin: 3.9 g/dL (ref 3.5–5.2)
Alkaline Phosphatase: 59 U/L (ref 39–117)
Bilirubin, Direct: 0.2 mg/dL (ref 0.0–0.3)
Total Bilirubin: 1.7 mg/dL — ABNORMAL HIGH (ref 0.2–1.2)
Total Protein: 6.5 g/dL (ref 6.0–8.3)

## 2022-11-11 LAB — HEMOGLOBIN A1C: Hgb A1c MFr Bld: 6.1 % (ref 4.6–6.5)

## 2022-11-14 ENCOUNTER — Encounter: Payer: Self-pay | Admitting: Internal Medicine

## 2022-11-14 ENCOUNTER — Ambulatory Visit (INDEPENDENT_AMBULATORY_CARE_PROVIDER_SITE_OTHER): Payer: Medicare Other | Admitting: Internal Medicine

## 2022-11-14 VITALS — BP 128/70 | HR 70 | Temp 98.0°F | Resp 16 | Ht 63.0 in | Wt 232.0 lb

## 2022-11-14 DIAGNOSIS — R739 Hyperglycemia, unspecified: Secondary | ICD-10-CM

## 2022-11-14 DIAGNOSIS — I10 Essential (primary) hypertension: Secondary | ICD-10-CM

## 2022-11-14 DIAGNOSIS — D6869 Other thrombophilia: Secondary | ICD-10-CM

## 2022-11-14 DIAGNOSIS — E785 Hyperlipidemia, unspecified: Secondary | ICD-10-CM

## 2022-11-14 DIAGNOSIS — J189 Pneumonia, unspecified organism: Secondary | ICD-10-CM

## 2022-11-14 DIAGNOSIS — E039 Hypothyroidism, unspecified: Secondary | ICD-10-CM

## 2022-11-14 DIAGNOSIS — F419 Anxiety disorder, unspecified: Secondary | ICD-10-CM

## 2022-11-14 DIAGNOSIS — I4821 Permanent atrial fibrillation: Secondary | ICD-10-CM

## 2022-11-14 DIAGNOSIS — K862 Cyst of pancreas: Secondary | ICD-10-CM

## 2022-11-14 DIAGNOSIS — Z0000 Encounter for general adult medical examination without abnormal findings: Secondary | ICD-10-CM | POA: Diagnosis not present

## 2022-11-14 DIAGNOSIS — E876 Hypokalemia: Secondary | ICD-10-CM | POA: Diagnosis not present

## 2022-11-14 MED ORDER — NYSTATIN 100000 UNIT/ML MT SUSP
5.0000 mL | Freq: Three times a day (TID) | OROMUCOSAL | 0 refills | Status: DC
Start: 2022-11-14 — End: 2022-12-30

## 2022-11-14 NOTE — Assessment & Plan Note (Addendum)
The 10-year ASCVD risk score (Arnett DK, et al., 2019) is: 16.8%   Values used to calculate the score:     Age: 72 years     Sex: Female     Is Non-Hispanic African American: No     Diabetic: No     Tobacco smoker: No     Systolic Blood Pressure: 128 mmHg     Is BP treated: Yes     HDL Cholesterol: 51.3 mg/dL     Total Cholesterol: 170 mg/dL  Low cholesterol diet and exercise.  Discussed calculated cholesterol risk.  Recommendation to start statin medication.  Follow lipid panel

## 2022-11-14 NOTE — Progress Notes (Signed)
Subjective:    Patient ID: Bonnie Shaw, female    DOB: [birth year], 72 y.o.   MRN: [id number]  Patient here for  Chief Complaint  Patient presents with   Medical Management of Chronic Issues    HPI Here to follow up regarding blood pressure, afib, blood sugar and hypercholesterolemia. Continues on eliquis.  Saw GI 04/08/22. Scheduled f/u MRI abdomen and MRCP - f/u pancreatic cyst. F/u MRI - no change in cyst.  Recommended 2 year f/u MRI. Was seen 07/23/22 - concern regarding swelling of uvula.  Treated with prednisone.  No swelling now.  Still notices sensation change when clearing throat. No trouble swallowing.  No swelling. No feeling of throat closing.  She does report increased sensitivity in her mouth.  Has been on abx recently.  Treated for sinus infection 08/01/22 - amoxicillin.  Covid positive 10/08/22. Reevaluation 10/21/22 - with cough (productive). CXR with RML pneumonia. Treated with zpak and augmentin. She reports she has been feeling better.  Over the last two days, noticed dry cough.  Some congestion - throat. Has noticed when vacuuming - increased sweating. No significant increased sob.  No increased acid reflux.  No abdominal pain reported.  Discussed labs.  Discussed taking potassium bid.    Past Medical History:  Diagnosis Date   Anxiety    Arthritis    Cataract    Complication of anesthesia    Woke up during colonoscopy and during Knee surgery. 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   Hyperlipidemia    Hypertension    Hyperthyroidism    s/p RAI therapy   Hypothyroidism    s/p ablation   Left atrial enlargement    severely enlarged   Migraine headache    2x/yr   Motion sickness    roller coasters   Obesity    Panic disorder    Persistent atrial fibrillation (HCC)    Snoring    Past Surgical History:  Procedure Laterality Date   ABDOMINAL  HYSTERECTOMY     CARDIOVERSION N/A 09/11/2017   Procedure: CARDIOVERSION;  Surgeon: Chilton Si, MD;  Location[facility] END[facility]COPY;  Service: Cardiovascular;  Laterality: N/A;   CATARACT EXTRACTION     COLONOSCOPY WITH PROPOFOL N/A 03/11/2019   Procedure: COLONOSCOPY WITH PROPOFOL;  Surgeon: Wyline Mood, MD;  Location: [facility];  Service: Endoscopy;  Laterality: N/A;  colon   COLONOSCOPY WITH PROPOFOL N/A 07/30/2022   Procedure: COLONOSCOPY WITH PROPOFOL;  Surgeon: Toledo, Boykin Nearing, MD;  Location: [facility] ENDOSCOPY;  Service: Gastroenterology;  Laterality: N/A;   KNEE SURGERY Bilateral    replacements   POLYPECTOMY  03/11/2019   Procedure: POLYPECTOMY;  Surgeon: Wyline Mood, MD;  Location: [facility];  Service: Endoscopy;;   THYROID ABLATION  2010   Family History  Problem Relation Age of Onset   Alzheimer's disease Father    Stroke Mother    Cancer Sister        ovarian    Ovarian cancer Sister 34   Breast cancer Neg Hx    Social History   Socioeconomic History   Marital status: Married    Spouse name: Not on file   Number of children: 4   Years of education: Not on file   Highest education level: Not on file  Occupational History    Employer: UNEMPLOYED  Tobacco Use  Smoking status: Never   Smokeless tobacco: Never  Vaping Use   Vaping status: Never Used  Substance and Sexual Activity   Alcohol use: No   Drug use: No   Sexual activity: Not on file  Other Topics Concern   Not on file  Social History Narrative   Lives in [place] with husband, has 4 children and 15 grandkids         Attends [employer], 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[employer]   Retired [employer]   Social Determinants of Corporate investment banker Strain: Low Risk  (05/09/2022)   Overall Financial Resource Strain (CARDIA)    Difficulty of Paying Living Expenses: Not hard at all  Food Insecurity: No Food Insecurity (05/09/2022)   Hunger Vital Sign     Worried About Running Out of Food in the Last Year: Never true    Ran Out of Food in the Last Year: Never true  Transportation Needs: No Transportation Needs (05/09/2022)   PRAPARE - Administrator, Civil Service (Medical): No    Lack of Transportation (Non-Medical): No  Physical Activity: Sufficiently Active (05/09/2022)   Exercise Vital Sign    Days of Exercise per Week: 5 days    Minutes of Exercise per Session: 30 min  Stress: No Stress Concern Present (05/09/2022)   [employer] of Occupational Health - Occupational Stress Questionnaire    Feeling of Stress : Not at all  Social Connections: Unknown (05/09/2022)   Social Connection and Isolation Panel [NHANES]    Frequency of Communication with Friends and Family: Not on file    Frequency of Social Gatherings with Friends and Family: Not on file    Attends Religious Services: Not on file    Active Member of Clubs or Organizations: Not on file    Attends Banker Meetings: Not on file    Marital Status: Married     Review of Systems  Constitutional:  Negative for appetite change, fever and unexpected weight change.  HENT:  Negative for sinus pressure and sore throat.        Some throat congestion.   Eyes:  Negative for pain and visual disturbance.  Respiratory:  Positive for cough. Negative for chest tightness.        No significant increased sob.   Cardiovascular:  Negative for chest pain and palpitations.  Gastrointestinal:  Negative for abdominal pain, diarrhea, nausea and vomiting.  Genitourinary:  Negative for difficulty urinating and dysuria.  Musculoskeletal:  Negative for joint swelling and myalgias.       Injection helped fingers/hand  Skin:  Negative for color change and rash.  Neurological:  Negative for dizziness and headaches.  Hematological:  Negative for adenopathy. Does not bruise/bleed easily.  Psychiatric/Behavioral:  Negative for agitation and dysphoric mood.        Objective:      BP 128/70   Pulse 70   Temp 98 F (36.7 C)   Resp 16   Ht 5\' 3"  (1.6 m)   Wt 232 lb (105.2 kg)   SpO2 98%   BMI 41.10 kg/m  Wt Readings from Last 3 Encounters:  11/14/22 232 lb (105.2 kg)  07/30/22 226 lb 9.6 oz (102.8 kg)  07/15/22 229 lb (103.9 kg)    Physical Exam Vitals reviewed.  Constitutional:      General: She is not in acute distress.    Appearance: Normal appearance.  HENT:  Head: Normocephalic and atraumatic.     Right Ear: External ear normal.     Left Ear: External ear normal.  Eyes:     General: No scleral icterus.       Right eye: No discharge.        Left eye: No discharge.     Conjunctiva/sclera: Conjunctivae normal.  Neck:     Thyroid: No thyromegaly.  Cardiovascular:     Rate and Rhythm: Normal rate and regular rhythm.  Pulmonary:     Effort: No respiratory distress.     Breath sounds: Normal breath sounds. No wheezing.  Abdominal:     General: Bowel sounds are normal.     Palpations: Abdomen is soft.     Tenderness: There is no abdominal tenderness.  Musculoskeletal:        General: No swelling or tenderness.     Cervical back: Neck supple. No tenderness.  Lymphadenopathy:     Cervical: No cervical adenopathy.  Skin:    Findings: No erythema or rash.  Neurological:     Mental Status: She is alert.  Psychiatric:        Mood and Affect: Mood normal.        Behavior: Behavior normal.      Outpatient Encounter Medications as of 11/14/2022  Medication Sig   nystatin (MYCOSTATIN) 100000 UNIT/ML suspension Take 5 mLs (500,000 Units total) by mouth in the morning, at noon, and at bedtime. Swish and spit tid.   apixaban (ELIQUIS) 5 MG TABS tablet Take 1 tablet (5 mg total) by mouth 2 (two) times daily.   atorvastatin (LIPITOR) 20 MG tablet TAKE 1 TABLET BY MOUTH ONCE  DAILY   cholecalciferol (VITAMIN D3) 25 MCG (1000 UT) tablet Take 1,000 Units by mouth daily. Taking 2,000mg  daily   hydrochlorothiazide (HYDRODIURIL) 25 MG tablet TAKE 1  TABLET BY MOUTH DAILY   levothyroxine (SYNTHROID) 50 MCG tablet Take 1 tablet (50 mcg total) by mouth daily.   potassium chloride (KLOR-CON) 10 MEQ tablet TAKE 1 TABLET BY MOUTH TWICE  DAILY   sertraline (ZOLOFT) 50 MG tablet TAKE 1 TABLET BY MOUTH DAILY   [DISCONTINUED] amoxicillin-clavulanate (AUGMENTIN) 875-125 MG tablet Take 1 tablet by mouth every 12 (twelve) hours.   [DISCONTINUED] azithromycin (ZITHROMAX) 250 MG tablet Take 1 tablet (250 mg total) by mouth daily. Take first 2 tablets together, then 1 every day until finished.   [DISCONTINUED] promethazine-dextromethorphan (PROMETHAZINE-DM) 6.25-15 MG/5ML syrup Take 5 mLs by mouth every 8 (eight) hours as needed for cough. (Patient not taking: Reported on 10/21/2022)   No facility-administered encounter medications on file as of 11/14/2022.     Lab Results  Component Value Date   WBC 9.0 07/14/2022   HGB 14.8 07/14/2022   HCT 44.0 07/14/2022   PLT 220.0 07/14/2022   GLUCOSE 120 (H) 11/11/2022   CHOL 170 11/11/2022   TRIG 181.0 (H) 11/11/2022   HDL 51.30 11/11/2022   LDLDIRECT 97.0 11/01/2018   LDLCALC 83 11/11/2022   ALT 12 11/11/2022   AST 12 11/11/2022   NA 141 11/11/2022   K 3.4 (L) 11/11/2022   CL 103 11/11/2022   CREATININE 0.79 11/11/2022   BUN 12 11/11/2022   CO2 28 11/11/2022   TSH 2.35 01/16/2022   INR 1.2 01/15/2022   HGBA1C 6.1 11/11/2022   MICROALBUR <0.7 06/11/2015    DG Chest 2 View  Result Date: 10/21/2022 CLINICAL DATA:  Cough, shortness of breath. EXAM: CHEST - 2 VIEW COMPARISON:  Chest radiographs 01/15/2022.  FINDINGS: Bilateral reticular opacities with more nodular appearance in the right mid lung are favored to be infectious or inflammatory in etiology. Stable cardiac and mediastinal contours. No pleural effusion or pneumothorax. IMPRESSION: Bilateral reticular opacities with more nodular appearance in the right mid lung are favored to be infectious or inflammatory in etiology. Electronically Signed    By: Orvan Falconer M.D.   On: 10/21/2022 12:44       Assessment & Plan:  Routine general medical examination at a health care facility  Pneumonia due to infectious organism, unspecified laterality, unspecified part of lung Assessment & Plan: Recent diagnosis as outlined.  Some dry cough recent.  Throat congestion.  Mouth more sensitive.  Nystatin suspension to swish and spit.  Saline nasal spray and steroid nasal spray.  Follow. Plan for f/u cxr in the next several weeks.  Call with update.   Orders: -     DG Chest 2 View; Future  Hypokalemia -     Potassium; Future  Acquired thrombophilia (HCC) Assessment & Plan: With afib.  On eliquis.  Follow.    Anxiety Assessment & Plan: Continue zoloft.  Stable.  Follow.     Permanent atrial fibrillation (HCC) Assessment & Plan: Rate controlled today on exam.  Has been on eliquis. Monitor for increased heart rate/palpitations.    Healthcare maintenance Assessment & Plan: Physical today 11/14/22.  Mammogram 03/29/21 - Birads I. Discussed.  Wants to hold on mammogram until 2025. Colonoscopy 07/30/22  - internal hemorrhoids.    Hyperglycemia Assessment & Plan: Low carb diet and exercise.  Follow met b and a1c.    Hyperlipidemia, unspecified hyperlipidemia type Assessment & Plan: The 10-year ASCVD risk score (Arnett DK, et al., 2019) is: 16.8%   Values used to calculate the score:     Age: 84 years     Sex: Female     Is Non-Hispanic African American: No     Diabetic: No     Tobacco smoker: No     Systolic Blood Pressure: 128 mmHg     Is BP treated: Yes     HDL Cholesterol: 51.3 mg/dL     Total Cholesterol: 170 mg/dL  Low cholesterol diet and exercise.  Discussed calculated cholesterol risk.  Recommendation to start statin medication.  Follow lipid panel    Primary hypertension Assessment & Plan: Blood pressure as outlined.  On hctz.  Follow pressures.  Follow metabolic panel.     Hypothyroidism, unspecified  type Assessment & Plan: On thyroid replacement.  Follow tsh.    Pancreatic cyst Assessment & Plan: Scheduled f/u MRI abdomen and MRCP - f/u pancreatic cyst. F/u MRI - no change in cyst.  Recommended 2 year f/u MRI.  Discussed MRI results.    Other orders -     Nystatin; Take 5 mLs (500,000 Units total) by mouth in the morning, at noon, and at bedtime. Swish and spit tid.  Dispense: 60 mL; Refill: 0     [place], MD

## 2022-11-14 NOTE — Patient Instructions (Signed)
Continue saline nasal spray  Nasacort nasal spray - 2 sprays each nostril one time per day.

## 2022-11-16 ENCOUNTER — Encounter: Payer: Self-pay | Admitting: Internal Medicine

## 2022-11-16 NOTE — Assessment & Plan Note (Signed)
Blood pressure as outlined.  On hctz.  Follow pressures.  Follow metabolic panel.

## 2022-11-16 NOTE — Assessment & Plan Note (Signed)
Recent diagnosis as outlined.  Some dry cough recent.  Throat congestion.  Mouth more sensitive.  Nystatin suspension to swish and spit.  Saline nasal spray and steroid nasal spray.  Follow. Plan for f/u cxr in the next several weeks.  Call with update.

## 2022-11-16 NOTE — Assessment & Plan Note (Signed)
Scheduled f/u MRI abdomen and MRCP - f/u pancreatic cyst. F/u MRI - no change in cyst.  Recommended 2 year f/u MRI.  Discussed MRI results.

## 2022-11-16 NOTE — Assessment & Plan Note (Signed)
Continue zoloft.  Stable.  Follow.

## 2022-11-16 NOTE — Assessment & Plan Note (Signed)
With afib.  On eliquis.  Follow.

## 2022-11-16 NOTE — Assessment & Plan Note (Signed)
Physical today 11/14/22.  Mammogram 03/29/21 - Birads I. Discussed.  Wants to hold on mammogram until 2025. Colonoscopy 07/30/22  - internal hemorrhoids.

## 2022-11-16 NOTE — Assessment & Plan Note (Signed)
Rate controlled today on exam.  Has been on eliquis. Monitor for increased heart rate/palpitations.

## 2022-11-16 NOTE — Assessment & Plan Note (Signed)
Low carb diet and exercise.  Follow met b and a1c.   

## 2022-11-16 NOTE — Assessment & Plan Note (Signed)
On thyroid replacement.  Follow tsh.  

## 2022-11-20 ENCOUNTER — Other Ambulatory Visit: Payer: Self-pay | Admitting: Internal Medicine

## 2022-11-28 ENCOUNTER — Other Ambulatory Visit (INDEPENDENT_AMBULATORY_CARE_PROVIDER_SITE_OTHER): Payer: Medicare Other

## 2022-11-28 DIAGNOSIS — E876 Hypokalemia: Secondary | ICD-10-CM

## 2022-11-28 LAB — POTASSIUM: Potassium: 3.6 meq/L (ref 3.5–5.1)

## 2022-12-02 ENCOUNTER — Other Ambulatory Visit: Payer: Self-pay | Admitting: Internal Medicine

## 2022-12-02 DIAGNOSIS — E876 Hypokalemia: Secondary | ICD-10-CM

## 2022-12-02 NOTE — Progress Notes (Signed)
Order placed for f/u potassium check.  

## 2022-12-12 ENCOUNTER — Ambulatory Visit (INDEPENDENT_AMBULATORY_CARE_PROVIDER_SITE_OTHER): Payer: Medicare Other

## 2022-12-12 ENCOUNTER — Other Ambulatory Visit (INDEPENDENT_AMBULATORY_CARE_PROVIDER_SITE_OTHER): Payer: Medicare Other

## 2022-12-12 DIAGNOSIS — E876 Hypokalemia: Secondary | ICD-10-CM | POA: Diagnosis not present

## 2022-12-12 DIAGNOSIS — J189 Pneumonia, unspecified organism: Secondary | ICD-10-CM

## 2022-12-12 LAB — POTASSIUM: Potassium: 3.9 meq/L (ref 3.5–5.1)

## 2022-12-15 ENCOUNTER — Telehealth: Payer: Self-pay

## 2022-12-15 NOTE — Telephone Encounter (Signed)
Patient just called back. I read her the message. She said she had a few questions concerning her chest Xray. Her number is [phone]. She would like for someone to go over that with her.

## 2022-12-15 NOTE — Telephone Encounter (Signed)
-----   Message from [place] sent at 12/15/2022 12:51 PM EST ----- Please clarify what information she is needing.  This was just a potassium recheck.  Potassium was the only lab drawn.

## 2022-12-15 NOTE — Telephone Encounter (Signed)
See cxr

## 2022-12-16 NOTE — Telephone Encounter (Signed)
LMTCB

## 2022-12-16 NOTE — Telephone Encounter (Signed)
Please notify her lungs are clear. No acute change on her xray.

## 2022-12-17 NOTE — Telephone Encounter (Signed)
Patient called about about the following note:  Pt is asking about her "aorta calcified" informed pt that the results was unchanged . Pt assistant on wanting to know why her report mentions aorta is calcified  Patient was advised that Dr Lorin Picket is seeing patients and has note had a chance to see note. Will call patient when Dr Lorin Picket replies.

## 2022-12-17 NOTE — Telephone Encounter (Signed)
See result note.  

## 2022-12-18 ENCOUNTER — Emergency Department: Payer: Medicare Other

## 2022-12-18 ENCOUNTER — Inpatient Hospital Stay
Admission: EM | Admit: 2022-12-18 | Discharge: 2022-12-21 | DRG: 871 | Disposition: A | Discharge status: Home / Self Care | Payer: Medicare Other | Attending: Internal Medicine | Admitting: Internal Medicine

## 2022-12-18 ENCOUNTER — Other Ambulatory Visit: Payer: Self-pay

## 2022-12-18 DIAGNOSIS — F32A Depression, unspecified: Secondary | ICD-10-CM | POA: Diagnosis present

## 2022-12-18 DIAGNOSIS — Z7901 Long term (current) use of anticoagulants: Secondary | ICD-10-CM

## 2022-12-18 DIAGNOSIS — A419 Sepsis, unspecified organism: Secondary | ICD-10-CM | POA: Diagnosis not present

## 2022-12-18 DIAGNOSIS — Z820 Family history of epilepsy and other diseases of the nervous system: Secondary | ICD-10-CM

## 2022-12-18 DIAGNOSIS — Z90710 Acquired absence of both cervix and uterus: Secondary | ICD-10-CM

## 2022-12-18 DIAGNOSIS — I482 Chronic atrial fibrillation, unspecified: Secondary | ICD-10-CM | POA: Insufficient documentation

## 2022-12-18 DIAGNOSIS — Z1152 Encounter for screening for COVID-19: Secondary | ICD-10-CM

## 2022-12-18 DIAGNOSIS — M1990 Unspecified osteoarthritis, unspecified site: Secondary | ICD-10-CM | POA: Diagnosis present

## 2022-12-18 DIAGNOSIS — T502X5A Adverse effect of carbonic-anhydrase inhibitors, benzothiadiazides and other diuretics, initial encounter: Secondary | ICD-10-CM | POA: Diagnosis present

## 2022-12-18 DIAGNOSIS — E890 Postprocedural hypothyroidism: Secondary | ICD-10-CM | POA: Diagnosis present

## 2022-12-18 DIAGNOSIS — Z886 Allergy status to analgesic agent status: Secondary | ICD-10-CM

## 2022-12-18 DIAGNOSIS — R531 Weakness: Secondary | ICD-10-CM | POA: Diagnosis not present

## 2022-12-18 DIAGNOSIS — E785 Hyperlipidemia, unspecified: Secondary | ICD-10-CM | POA: Diagnosis present

## 2022-12-18 DIAGNOSIS — Z888 Allergy status to other drugs, medicaments and biological substances status: Secondary | ICD-10-CM

## 2022-12-18 DIAGNOSIS — J189 Pneumonia, unspecified organism: Secondary | ICD-10-CM | POA: Diagnosis present

## 2022-12-18 DIAGNOSIS — Z6841 Body Mass Index (BMI) 40.0 and over, adult: Secondary | ICD-10-CM

## 2022-12-18 DIAGNOSIS — E039 Hypothyroidism, unspecified: Secondary | ICD-10-CM | POA: Diagnosis present

## 2022-12-18 DIAGNOSIS — R0902 Hypoxemia: Secondary | ICD-10-CM | POA: Diagnosis present

## 2022-12-18 DIAGNOSIS — N393 Stress incontinence (female) (male): Secondary | ICD-10-CM | POA: Diagnosis present

## 2022-12-18 DIAGNOSIS — I10 Essential (primary) hypertension: Secondary | ICD-10-CM | POA: Diagnosis present

## 2022-12-18 DIAGNOSIS — Z8669 Personal history of other diseases of the nervous system and sense organs: Secondary | ICD-10-CM

## 2022-12-18 DIAGNOSIS — E669 Obesity, unspecified: Secondary | ICD-10-CM | POA: Diagnosis present

## 2022-12-18 DIAGNOSIS — Z8616 Personal history of COVID-19: Secondary | ICD-10-CM

## 2022-12-18 DIAGNOSIS — Z823 Family history of stroke: Secondary | ICD-10-CM

## 2022-12-18 DIAGNOSIS — Z79899 Other long term (current) drug therapy: Secondary | ICD-10-CM

## 2022-12-18 DIAGNOSIS — Z79890 Hormone replacement therapy (postmenopausal): Secondary | ICD-10-CM

## 2022-12-18 DIAGNOSIS — R350 Frequency of micturition: Secondary | ICD-10-CM | POA: Diagnosis present

## 2022-12-18 DIAGNOSIS — Z8701 Personal history of pneumonia (recurrent): Secondary | ICD-10-CM

## 2022-12-18 DIAGNOSIS — Z882 Allergy status to sulfonamides status: Secondary | ICD-10-CM

## 2022-12-18 DIAGNOSIS — Z8041 Family history of malignant neoplasm of ovary: Secondary | ICD-10-CM

## 2022-12-18 DIAGNOSIS — I4891 Unspecified atrial fibrillation: Secondary | ICD-10-CM | POA: Diagnosis present

## 2022-12-18 DIAGNOSIS — I4821 Permanent atrial fibrillation: Secondary | ICD-10-CM | POA: Diagnosis present

## 2022-12-18 DIAGNOSIS — E876 Hypokalemia: Secondary | ICD-10-CM | POA: Diagnosis present

## 2022-12-18 DIAGNOSIS — Z883 Allergy status to other anti-infective agents status: Secondary | ICD-10-CM

## 2022-12-18 DIAGNOSIS — E871 Hypo-osmolality and hyponatremia: Secondary | ICD-10-CM | POA: Diagnosis present

## 2022-12-18 DIAGNOSIS — F419 Anxiety disorder, unspecified: Secondary | ICD-10-CM | POA: Diagnosis present

## 2022-12-18 LAB — COMPREHENSIVE METABOLIC PANEL
ALT: 16 U/L (ref 0–44)
AST: 19 U/L (ref 15–41)
Albumin: 3.7 g/dL (ref 3.5–5.0)
Alkaline Phosphatase: 44 U/L (ref 38–126)
Anion gap: 11 (ref 5–15)
BUN: 12 mg/dL (ref 8–23)
CO2: 21 mmol/L — ABNORMAL LOW (ref 22–32)
Calcium: 8.7 mg/dL — ABNORMAL LOW (ref 8.9–10.3)
Chloride: 100 mmol/L (ref 98–111)
Creatinine, Ser: 0.77 mg/dL (ref 0.44–1.00)
GFR, Estimated: >60 mL/min (ref >60)
Glucose, Bld: 187 mg/dL — ABNORMAL HIGH (ref 70–99)
Potassium: 3 mmol/L — ABNORMAL LOW (ref 3.5–5.1)
Sodium: 132 mmol/L — ABNORMAL LOW (ref 135–145)
Total Bilirubin: 2.4 mg/dL — ABNORMAL HIGH (ref <1.2)
Total Protein: 6.4 g/dL — ABNORMAL LOW (ref 6.5–8.1)

## 2022-12-18 LAB — URINALYSIS, W/ REFLEX TO CULTURE (INFECTION SUSPECTED)
Bilirubin Urine: NEGATIVE
Glucose, UA: NEGATIVE mg/dL
Hgb urine dipstick: NEGATIVE
Ketones, ur: NEGATIVE mg/dL
Leukocytes,Ua: NEGATIVE
Nitrite: NEGATIVE
Protein, ur: 30 mg/dL — AB
Specific Gravity, Urine: 1.024 (ref 1.005–1.030)
pH: 6 (ref 5.0–8.0)

## 2022-12-18 LAB — RESP PANEL BY RT-PCR (RSV, FLU A&B, COVID)  RVPGX2
Influenza A by PCR: NEGATIVE
Influenza B by PCR: NEGATIVE
Resp Syncytial Virus by PCR: NEGATIVE
SARS Coronavirus 2 by RT PCR: NEGATIVE

## 2022-12-18 LAB — CBC WITH DIFFERENTIAL/PLATELET
Abs Immature Granulocytes: 0.03 10*3/uL (ref 0.00–0.07)
Basophils Absolute: 0 10*3/uL (ref 0.0–0.1)
Basophils Relative: 0 %
Eosinophils Absolute: 0 10*3/uL (ref 0.0–0.5)
Eosinophils Relative: 0 %
HCT: 39.5 % (ref 36.0–46.0)
Hemoglobin: 13.8 g/dL (ref 12.0–15.0)
Immature Granulocytes: 0 %
Lymphocytes Relative: 15 %
Lymphs Abs: 1.6 10*3/uL (ref 0.7–4.0)
MCH: 32.9 pg (ref 26.0–34.0)
MCHC: 34.9 g/dL (ref 30.0–36.0)
MCV: 94 fL (ref 80.0–100.0)
Monocytes Absolute: 1.2 10*3/uL — ABNORMAL HIGH (ref 0.1–1.0)
Monocytes Relative: 12 %
Neutro Abs: 7.4 10*3/uL (ref 1.7–7.7)
Neutrophils Relative %: 73 %
Platelets: 173 10*3/uL (ref 150–400)
RBC: 4.2 MIL/uL (ref 3.87–5.11)
RDW: 13.1 % (ref 11.5–15.5)
WBC: 10.3 10*3/uL (ref 4.0–10.5)
nRBC: 0 % (ref 0.0–0.2)

## 2022-12-18 LAB — PROTIME-INR
INR: 1.3 — ABNORMAL HIGH (ref 0.8–1.2)
Prothrombin Time: 16.1 s — ABNORMAL HIGH (ref 11.4–15.2)

## 2022-12-18 LAB — LACTIC ACID, PLASMA: Lactic Acid, Venous: 2.1 mmol/L (ref 0.5–1.9)

## 2022-12-18 MED ORDER — LACTATED RINGERS IV BOLUS (SEPSIS)
1000.0000 mL | Freq: Once | INTRAVENOUS | Status: AC
  Administered 2022-12-19: 1000 mL via INTRAVENOUS

## 2022-12-18 MED ORDER — POTASSIUM CHLORIDE 10 MEQ/100ML IV SOLN
10.0000 meq | Freq: Once | INTRAVENOUS | Status: AC
  Administered 2022-12-19: 10 meq via INTRAVENOUS
  Filled 2022-12-18: qty 100

## 2022-12-18 MED ORDER — LACTATED RINGERS IV BOLUS (SEPSIS): 1000.0000 mL | Freq: Once | INTRAVENOUS | Status: DC

## 2022-12-18 MED ORDER — LACTATED RINGERS IV BOLUS (SEPSIS)
1000.0000 mL | Freq: Once | INTRAVENOUS | Status: AC
Start: 2022-12-19 — End: 2022-12-19
  Administered 2022-12-19: 1000 mL via INTRAVENOUS

## 2022-12-18 MED ORDER — HYDROCOD POLI-CHLORPHE POLI ER 10-8 MG/5ML PO SUER
5.0000 mL | Freq: Once | ORAL | Status: AC
  Administered 2022-12-18: 5 mL via ORAL
  Filled 2022-12-18: qty 5

## 2022-12-18 MED ORDER — SODIUM CHLORIDE 0.9 % IV SOLN
2.0000 g | INTRAVENOUS | Status: DC
  Administered 2022-12-18 – 2022-12-20 (×3): 2 g via INTRAVENOUS
  Filled 2022-12-18 (×3): qty 20

## 2022-12-18 MED ORDER — DEXTROSE 5 % IV SOLN
500.0000 mg | INTRAVENOUS | Status: DC
  Administered 2022-12-19: 500 mg via INTRAVENOUS
  Filled 2022-12-18: qty 5

## 2022-12-18 NOTE — ED Provider Notes (Signed)
[facility] Provider Note    Event Date/Time   First MD Initiated Contact with Patient 12/18/22 2312     (approximate)   History   Weakness   HPI  Bonnie Shaw is a 72 y.o. female who presents to the ED from home with a chief complaint of generalized weakness, confusion, nonproductive cough/congestion and urinary incontinence.  Patient states she had COVID in September.  Diagnosed with pneumonia shortly thereafter.  Reports nonproductive cough and congestion x 2 weeks.  Saw PCP 1 week ago with negative chest x-ray.  Denies history of lung disease such as COPD or CHF.  Does have a history of atrial fibrillation for which she takes Eliquis.  Denies fever/chills, chest pain, abdominal pain, nausea, vomiting or dizziness.     Past Medical History   Past Medical History:  Diagnosis Date   Anxiety    Arthritis    Cataract    Complication of anesthesia    Woke up during colonoscopy and during Knee surgery. 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   Hyperlipidemia    Hypertension    Hyperthyroidism    s/p RAI therapy   Hypothyroidism    s/p ablation   Left atrial enlargement    severely enlarged   Migraine headache    2x/yr   Motion sickness    roller coasters   Obesity    Panic disorder    Persistent atrial fibrillation (HCC)    Snoring      Active Problem List   Patient Active Problem List   Diagnosis Date Noted   Pneumonia 11/14/2022   Viral illness 10/08/2022   Thumb pain 07/15/2022   Chest pain 03/13/2022   Urinary urgency 03/13/2022   Diarrhea 02/04/2022   Scalp hematoma 01/16/2022   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/31/2021   Pre-op evaluation 11/20/2021   Bunion 04/30/2021   Pancreatic cyst 04/30/2021   Joint ache 01/29/2021   Acquired thrombophilia (HCC) 07/13/2020   Severe obesity (BMI >= 40) (HCC)  07/13/2020   Change in bowel movement 11/13/2019   Hyperbilirubinemia 11/13/2019   Colon cancer screening 02/20/2019   Near syncope 09/27/2017   Hyperglycemia 04/26/2017   History of Guillain-Barre syndrome 09/06/2016   Orthostatic hypotension 07/09/2016   Vitamin D deficiency 12/30/2015   Healthcare maintenance 09/12/2015   Atrial fibrillation (HCC) 12/02/2013   Anxiety 12/02/2013   Health care maintenance 04/28/2013   Hyperlipidemia 02/23/2012   Fatigue 08/22/2011   Hypothyroidism 02/20/2011   Hypertension 02/20/2011     Past Surgical History   Past Surgical History:  Procedure Laterality Date   ABDOMINAL HYSTERECTOMY     CARDIOVERSION N/A 09/11/2017   Procedure: CARDIOVERSION;  Surgeon: Chilton Si, MD;  Location: [facility] ENDOSCOPY;  Service: Cardiovascular;  Laterality: N/A;   CATARACT EXTRACTION     COLONOSCOPY WITH PROPOFOL N/A 03/11/2019   Procedure: COLONOSCOPY WITH PROPOFOL;  Surgeon: Wyline Mood, MD;  Location: [facility];  Service: Endoscopy;  Laterality: N/A;  colon   COLONOSCOPY WITH PROPOFOL N/A 07/30/2022   Procedure: COLONOSCOPY WITH PROPOFOL;  Surgeon: Toledo, Boykin Nearing, MD;  Location: [facility] ENDOSCOPY;  Service: Gastroenterology;  Laterality: N/A;   KNEE SURGERY Bilateral    replacements   POLYPECTOMY  03/11/2019   Procedure: POLYPECTOMY;  Surgeon: Wyline Mood, MD;  Location: [facility];  Service: Endoscopy;;  THYROID ABLATION  2010     Home Medications   Prior to Admission medications   Medication Sig Start Date End Date Taking? Authorizing Provider  atorvastatin (LIPITOR) 20 MG tablet TAKE 1 TABLET BY MOUTH ONCE  DAILY 09/12/22   Dale [place], MD  cholecalciferol (VITAMIN D3) 25 MCG (1000 UT) tablet Take 1,000 Units by mouth daily. Taking 2,000mg  daily    [provider]  ELIQUIS 5 MG TABS tablet TAKE 1 TABLET BY MOUTH TWICE  DAILY 11/21/22   Dale [place], MD  hydrochlorothiazide (HYDRODIURIL) 25 MG tablet TAKE 1 TABLET BY  MOUTH DAILY 09/23/22   Dale [place], MD  levothyroxine (SYNTHROID) 50 MCG tablet Take 1 tablet (50 mcg total) by mouth daily. 09/25/22   Dale [place], MD  nystatin (MYCOSTATIN) 100000 UNIT/ML suspension Take 5 mLs (500,000 Units total) by mouth in the morning, at noon, and at bedtime. Swish and spit tid. 11/14/22   Dale [place], MD  potassium chloride (KLOR-CON) 10 MEQ tablet TAKE 1 TABLET BY MOUTH TWICE  DAILY 09/23/22   Dale [place], MD  sertraline (ZOLOFT) 50 MG tablet TAKE 1 TABLET BY MOUTH DAILY 09/23/22   Dale [place], MD     Allergies  Ativan [lorazepam], Valium [diazepam], Meloxicam, Trimethoprim, Sulfamethoxazole, and Sulfonamide derivatives   Family History   Family History  Problem Relation Age of Onset   Alzheimer's disease Father    Stroke Mother    Cancer Sister        ovarian    Ovarian cancer Sister 36   Breast cancer Neg Hx      Physical Exam  Triage Vital Signs: ED Triage Vitals  Encounter Vitals Group     BP 12/18/22 2213 (!) 138/112     Systolic BP Percentile --      Diastolic BP Percentile --      Pulse Rate 12/18/22 2213 (!) 148     Resp 12/18/22 2213 20     Temp 12/18/22 2213 98.4 F (36.9 C)     Temp Source 12/18/22 2213 Oral     SpO2 12/18/22 2213 93 %     Weight 12/18/22 2211 270 lb (122.5 kg)     Height 12/18/22 2211 5\' 3"  (1.6 m)     Head Circumference --      Peak Flow --      Pain Score --      Pain Loc --      Pain Education --      Exclude from Growth Chart --     Updated Vital Signs: BP 139/88   Pulse (!) 101   Temp 98.4 F (36.9 C) (Oral)   Resp (!) 24   Ht 5\' 3"  (1.6 m)   Wt 122.5 kg   SpO2 94%   BMI 47.83 kg/m    General: Awake, mild distress.  CV:  Tachycardic, irregularly irregular rhythm.  Good peripheral perfusion.  Resp:  Increased effort.  Diminished aeration with scattered rhonchi.  Loose, rattly cough noted. Abd:  Nontender.  No distention.  Other:  Bilateral calves are supple and  nontender.   ED Results / Procedures / Treatments  Labs (all labs ordered are listed, but only abnormal results are displayed) Labs Reviewed  COMPREHENSIVE METABOLIC PANEL - Abnormal; Notable for the following components:      Result Value   Sodium 132 (*)    Potassium 3.0 (*)    CO2 21 (*)    Glucose, Bld 187 (*)    Calcium 8.7 (*)  Total Protein 6.4 (*)    Total Bilirubin 2.4 (*)    All other components within normal limits  LACTIC ACID, PLASMA - Abnormal; Notable for the following components:   Lactic Acid, Venous 2.1 (*)    All other components within normal limits  CBC WITH DIFFERENTIAL/PLATELET - Abnormal; Notable for the following components:   Monocytes Absolute 1.2 (*)    All other components within normal limits  PROTIME-INR - Abnormal; Notable for the following components:   Prothrombin Time 16.1 (*)    INR 1.3 (*)    All other components within normal limits  URINALYSIS, W/ REFLEX TO CULTURE (INFECTION SUSPECTED) - Abnormal; Notable for the following components:   Color, Urine AMBER (*)    APPearance HAZY (*)    Protein, ur 30 (*)    Bacteria, UA RARE (*)    All other components within normal limits  RESP PANEL BY RT-PCR (RSV, FLU A&B, COVID)  [id number]  CULTURE, BLOOD (ROUTINE X 2)  CULTURE, BLOOD (ROUTINE X 2)  LACTIC ACID, PLASMA     EKG  ED ECG REPORT I, Delania Ferg J, the attending physician, personally viewed and interpreted this ECG.   Date: 12/19/2022  EKG Time: 2217  Rate: 157  Rhythm: atrial fibrillation, rate 157  Axis: Normal  Intervals:none  ST&T Change: Nonspecific    RADIOLOGY I have independently visualized and interpreted patient's imaging study as well as noted the radiology interpretation:  X-ray: Mild cardiomegaly  Official radiology report(s): DG Chest Port 1 View  Result Date: 12/18/2022 CLINICAL DATA:  Sepsis EXAM: PORTABLE CHEST 1 VIEW COMPARISON:  12/12/2022 FINDINGS: Mild cardiomegaly. No focal opacity or pleural  effusion. No pneumothorax IMPRESSION: No active disease. Mild cardiomegaly. Electronically Signed   By: Jasmine Pang M.D.   On: 12/18/2022 23:50     PROCEDURES:  Critical Care performed: Yes, see critical care procedure note(s)  CRITICAL CARE Performed by: Irean Hong   Total critical care time: 30 minutes  Critical care time was exclusive of separately billable procedures and treating other patients.  Critical care was necessary to treat or prevent imminent or life-threatening deterioration.  Critical care was time spent personally by me on the following activities: development of treatment plan with patient and/or surrogate as well as nursing, discussions with consultants, evaluation of patient's response to treatment, examination of patient, obtaining history from patient or surrogate, ordering and performing treatments and interventions, ordering and review of laboratory studies, ordering and review of radiographic studies, pulse oximetry and re-evaluation of patient's condition.   Marland Kitchen1-3 Lead EKG Interpretation  Performed by: Irean Hong, MD Authorized by: Irean Hong, MD     Interpretation: abnormal     ECG rate:  101   ECG rate assessment: tachycardic     Rhythm: atrial fibrillation     Ectopy: none     Conduction: normal   Comments:     Patient placed on cardiac monitor to evaluate for arrhythmias    MEDICATIONS ORDERED IN ED: Medications  lactated ringers bolus 1,000 mL (1,000 mLs Intravenous New Bag/Given 12/19/22 0015)    And  lactated ringers bolus 1,000 mL (has no administration in time range)  cefTRIAXone (ROCEPHIN) 2 g in sodium chloride 0.9 % 100 mL IVPB (2 g Intravenous New Bag/Given 12/18/22 2348)  azithromycin (ZITHROMAX) 500 mg in dextrose 5 % 250 mL IVPB (has no administration in time range)  potassium chloride 10 mEq in 100 mL IVPB (has no administration in time range)  potassium chloride  10 mEq in 100 mL IVPB (has no administration in time range)   chlorpheniramine-HYDROcodone (TUSSIONEX) 10-8 MG/5ML suspension 5 mL (5 mLs Oral Given 12/18/22 2357)     IMPRESSION / MDM / ASSESSMENT AND PLAN / ED COURSE  I reviewed the triage vital signs and the nursing notes.                             72 year old female presenting with generalized weakness, cough/congestion. Differential includes, but is not limited to, viral syndrome, bronchitis including COPD exacerbation, pneumonia, reactive airway disease including asthma, CHF including exacerbation with or without pulmonary/interstitial edema, pneumothorax, ACS, thoracic trauma, and pulmonary embolism. I have personally reviewed patient's records and note an orthopedic office visit on 12/05/2022.  Patient's presentation is most consistent with acute presentation with potential threat to life or bodily function.  The patient is on the cardiac monitor to evaluate for evidence of arrhythmia and/or significant heart rate changes.  Patient's room air saturation 90% on my arrival.  Placed her on 2 L nasal cannula oxygen which brought her up to 94%.  Laboratory results demonstrate normal WBC 10.3, hypokalemia with potassium of 3, lactic acid is 2.1.  I have ordered less than 30 cc/kilo of IV fluids given patient's increased BMI and only mildly elevated lactic acid.  Broad-spectrum IV antibiotics ordered.  Will consult hospitalist services for evaluation and admission.      FINAL CLINICAL IMPRESSION(S) / ED DIAGNOSES   Final diagnoses:  Generalized weakness  Hypokalemia  Community acquired pneumonia, unspecified laterality  Atrial fibrillation with rapid ventricular response (HCC)  Sepsis, due to unspecified organism, unspecified whether acute organ dysfunction present (HCC)  Hypoxia     Rx / DC Orders   ED Discharge Orders     None        Note:  This document was prepared using Dragon voice recognition software and may include unintentional dictation errors.   Irean Hong,  MD 12/19/22 [phone]

## 2022-12-18 NOTE — ED Triage Notes (Signed)
Pt and son report weakness and confusion. Pt states " I know what's going on, but I feel slow." Reports concern for pneumonia and/or UTI. Pt has had cough and congestion x 2 weeks. Pt had a negative CXR at pcp ~1 wk ago. Pt reports 2-4 episodes of urinary incontinence at home without hx of same. Pt also reports fatigue, SOB, nausea, decreased appetite, decreased fluid intake. Denies hx of asthma or COPD. Pt oriented and answering all questions appropriately. Breathing unlabored. Dry cough noted. Symmetric chest rise and fall noted. Pt to triage via wheelchair.  Reports difficulty walking today due to feeling weak. Pt has hx of afib. Triage HR 148-150.

## 2022-12-18 NOTE — Sepsis Progress Note (Signed)
Elink monitoring for the code sepsis protocol.  

## 2022-12-18 NOTE — Progress Notes (Signed)
CODE SEPSIS - PHARMACY COMMUNICATION  **Broad Spectrum Antibiotics should be administered within 1 hour of Sepsis diagnosis**  Time Code Sepsis Called/Page Received: 2323  Antibiotics Ordered: Azithromycin & Ceftriaxone  Time of 1st antibiotic administration: 2348  Otelia Sergeant, PharmD, [facility] 12/18/2022 11:23 PM

## 2022-12-18 NOTE — Telephone Encounter (Signed)
See results note. Dr Lorin Picket discussed with pt/

## 2022-12-19 ENCOUNTER — Encounter: Payer: Self-pay | Admitting: Family Medicine

## 2022-12-19 DIAGNOSIS — R0902 Hypoxemia: Secondary | ICD-10-CM | POA: Diagnosis present

## 2022-12-19 DIAGNOSIS — A419 Sepsis, unspecified organism: Secondary | ICD-10-CM | POA: Diagnosis present

## 2022-12-19 DIAGNOSIS — E669 Obesity, unspecified: Secondary | ICD-10-CM | POA: Diagnosis present

## 2022-12-19 DIAGNOSIS — E876 Hypokalemia: Secondary | ICD-10-CM

## 2022-12-19 DIAGNOSIS — J189 Pneumonia, unspecified organism: Secondary | ICD-10-CM | POA: Diagnosis present

## 2022-12-19 DIAGNOSIS — E871 Hypo-osmolality and hyponatremia: Secondary | ICD-10-CM | POA: Diagnosis present

## 2022-12-19 DIAGNOSIS — Z79899 Other long term (current) drug therapy: Secondary | ICD-10-CM | POA: Diagnosis not present

## 2022-12-19 DIAGNOSIS — E039 Hypothyroidism, unspecified: Secondary | ICD-10-CM

## 2022-12-19 DIAGNOSIS — F32A Depression, unspecified: Secondary | ICD-10-CM | POA: Diagnosis present

## 2022-12-19 DIAGNOSIS — T502X5A Adverse effect of carbonic-anhydrase inhibitors, benzothiadiazides and other diuretics, initial encounter: Secondary | ICD-10-CM | POA: Diagnosis present

## 2022-12-19 DIAGNOSIS — E785 Hyperlipidemia, unspecified: Secondary | ICD-10-CM | POA: Diagnosis present

## 2022-12-19 DIAGNOSIS — A021 Salmonella sepsis: Secondary | ICD-10-CM | POA: Diagnosis not present

## 2022-12-19 DIAGNOSIS — I4891 Unspecified atrial fibrillation: Secondary | ICD-10-CM | POA: Diagnosis not present

## 2022-12-19 DIAGNOSIS — Z888 Allergy status to other drugs, medicaments and biological substances status: Secondary | ICD-10-CM | POA: Diagnosis not present

## 2022-12-19 DIAGNOSIS — Z7901 Long term (current) use of anticoagulants: Secondary | ICD-10-CM | POA: Diagnosis not present

## 2022-12-19 DIAGNOSIS — Z8701 Personal history of pneumonia (recurrent): Secondary | ICD-10-CM | POA: Diagnosis not present

## 2022-12-19 DIAGNOSIS — F419 Anxiety disorder, unspecified: Secondary | ICD-10-CM | POA: Insufficient documentation

## 2022-12-19 DIAGNOSIS — Z8669 Personal history of other diseases of the nervous system and sense organs: Secondary | ICD-10-CM | POA: Diagnosis not present

## 2022-12-19 DIAGNOSIS — I4821 Permanent atrial fibrillation: Secondary | ICD-10-CM | POA: Diagnosis present

## 2022-12-19 DIAGNOSIS — Z886 Allergy status to analgesic agent status: Secondary | ICD-10-CM | POA: Diagnosis not present

## 2022-12-19 DIAGNOSIS — Z882 Allergy status to sulfonamides status: Secondary | ICD-10-CM | POA: Diagnosis not present

## 2022-12-19 DIAGNOSIS — R531 Weakness: Secondary | ICD-10-CM | POA: Diagnosis present

## 2022-12-19 DIAGNOSIS — Z79890 Hormone replacement therapy (postmenopausal): Secondary | ICD-10-CM | POA: Diagnosis not present

## 2022-12-19 DIAGNOSIS — I10 Essential (primary) hypertension: Secondary | ICD-10-CM | POA: Diagnosis present

## 2022-12-19 DIAGNOSIS — Z6841 Body Mass Index (BMI) 40.0 and over, adult: Secondary | ICD-10-CM | POA: Diagnosis not present

## 2022-12-19 DIAGNOSIS — I482 Chronic atrial fibrillation, unspecified: Secondary | ICD-10-CM | POA: Insufficient documentation

## 2022-12-19 DIAGNOSIS — E890 Postprocedural hypothyroidism: Secondary | ICD-10-CM | POA: Diagnosis present

## 2022-12-19 DIAGNOSIS — Z8616 Personal history of COVID-19: Secondary | ICD-10-CM | POA: Diagnosis not present

## 2022-12-19 DIAGNOSIS — Z1152 Encounter for screening for COVID-19: Secondary | ICD-10-CM | POA: Diagnosis not present

## 2022-12-19 LAB — CBC
HCT: 34.9 % — ABNORMAL LOW (ref 36.0–46.0)
Hemoglobin: 11.9 g/dL — ABNORMAL LOW (ref 12.0–15.0)
MCH: 32.9 pg (ref 26.0–34.0)
MCHC: 34.1 g/dL (ref 30.0–36.0)
MCV: 96.4 fL (ref 80.0–100.0)
Platelets: 120 10*3/uL — ABNORMAL LOW (ref 150–400)
RBC: 3.62 MIL/uL — ABNORMAL LOW (ref 3.87–5.11)
RDW: 13.2 % (ref 11.5–15.5)
WBC: 7.5 10*3/uL (ref 4.0–10.5)
nRBC: 0 % (ref 0.0–0.2)

## 2022-12-19 LAB — BASIC METABOLIC PANEL
Anion gap: 9 (ref 5–15)
BUN: 10 mg/dL (ref 8–23)
CO2: 25 mmol/L (ref 22–32)
Calcium: 8.1 mg/dL — ABNORMAL LOW (ref 8.9–10.3)
Chloride: 104 mmol/L (ref 98–111)
Creatinine, Ser: 0.59 mg/dL (ref 0.44–1.00)
GFR, Estimated: >60 mL/min (ref >60)
Glucose, Bld: 103 mg/dL — ABNORMAL HIGH (ref 70–99)
Potassium: 3.4 mmol/L — ABNORMAL LOW (ref 3.5–5.1)
Sodium: 138 mmol/L (ref 135–145)

## 2022-12-19 LAB — CORTISOL-AM, BLOOD: Cortisol - AM: 9.3 ug/dL (ref 6.7–22.6)

## 2022-12-19 LAB — BRAIN NATRIURETIC PEPTIDE: B Natriuretic Peptide: 192.3 pg/mL — ABNORMAL HIGH (ref 0.0–100.0)

## 2022-12-19 LAB — PHOSPHORUS: Phosphorus: 2.9 mg/dL (ref 2.5–4.6)

## 2022-12-19 LAB — PROCALCITONIN: Procalcitonin: 0.18 ng/mL

## 2022-12-19 LAB — LACTIC ACID, PLASMA: Lactic Acid, Venous: 1.7 mmol/L (ref 0.5–1.9)

## 2022-12-19 LAB — MAGNESIUM: Magnesium: 1.6 mg/dL — ABNORMAL LOW (ref 1.7–2.4)

## 2022-12-19 LAB — PROTIME-INR
INR: 1.3 — ABNORMAL HIGH (ref 0.8–1.2)
Prothrombin Time: 15.9 s — ABNORMAL HIGH (ref 11.4–15.2)

## 2022-12-19 MED ORDER — SODIUM CHLORIDE 0.9 % IV SOLN
150.0000 mL/h | INTRAVENOUS | Status: AC
  Administered 2022-12-19 (×2): 150 mL/h via INTRAVENOUS

## 2022-12-19 MED ORDER — SERTRALINE HCL 50 MG PO TABS
50.0000 mg | ORAL_TABLET | Freq: Every day | ORAL | Status: DC
  Administered 2022-12-19 – 2022-12-21 (×3): 50 mg via ORAL
  Filled 2022-12-19 (×3): qty 1

## 2022-12-19 MED ORDER — MAGNESIUM SULFATE 2 GM/50ML IV SOLN
2.0000 g | Freq: Once | INTRAVENOUS | Status: AC
  Administered 2022-12-19: 2 g via INTRAVENOUS
  Filled 2022-12-19: qty 50

## 2022-12-19 MED ORDER — ATORVASTATIN CALCIUM 20 MG PO TABS
20.0000 mg | ORAL_TABLET | Freq: Every day | ORAL | Status: DC
  Administered 2022-12-19 – 2022-12-21 (×3): 20 mg via ORAL
  Filled 2022-12-19 (×3): qty 1

## 2022-12-19 MED ORDER — ALPRAZOLAM 0.5 MG PO TABS: 1.0000 mg | ORAL_TABLET | Freq: Every evening | ORAL | Status: DC | PRN

## 2022-12-19 MED ORDER — SODIUM CHLORIDE 0.9 % IV SOLN: 2.0000 g | INTRAVENOUS | Status: DC

## 2022-12-19 MED ORDER — SENNOSIDES-DOCUSATE SODIUM 8.6-50 MG PO TABS: 1.0000 | ORAL_TABLET | Freq: Every evening | ORAL | Status: DC | PRN

## 2022-12-19 MED ORDER — TRAZODONE HCL 50 MG PO TABS: 25.0000 mg | ORAL_TABLET | Freq: Every evening | ORAL | Status: DC | PRN

## 2022-12-19 MED ORDER — AZITHROMYCIN 250 MG PO TABS
500.0000 mg | ORAL_TABLET | Freq: Every day | ORAL | Status: DC
  Administered 2022-12-19 – 2022-12-20 (×2): 500 mg via ORAL
  Filled 2022-12-19 (×2): qty 2

## 2022-12-19 MED ORDER — ONDANSETRON HCL 4 MG/2ML IJ SOLN: 4.0000 mg | Freq: Four times a day (QID) | INTRAMUSCULAR | Status: DC | PRN

## 2022-12-19 MED ORDER — POTASSIUM CHLORIDE CRYS ER 20 MEQ PO TBCR
40.0000 meq | EXTENDED_RELEASE_TABLET | Freq: Once | ORAL | Status: AC
  Administered 2022-12-19: 40 meq via ORAL
  Filled 2022-12-19: qty 2

## 2022-12-19 MED ORDER — METOPROLOL TARTRATE 5 MG/5ML IV SOLN
5.0000 mg | INTRAVENOUS | Status: DC | PRN
Start: 2022-12-19 — End: 2022-12-21
  Administered 2022-12-20: 5 mg via INTRAVENOUS
  Filled 2022-12-19: qty 5

## 2022-12-19 MED ORDER — HYDRALAZINE HCL 20 MG/ML IJ SOLN
10.0000 mg | INTRAMUSCULAR | Status: DC | PRN
Start: 2022-12-19 — End: 2022-12-21

## 2022-12-19 MED ORDER — MAGNESIUM HYDROXIDE 400 MG/5ML PO SUSP: 30.0000 mL | Freq: Every day | ORAL | Status: DC | PRN

## 2022-12-19 MED ORDER — HYDROCOD POLI-CHLORPHE POLI ER 10-8 MG/5ML PO SUER
5.0000 mL | Freq: Two times a day (BID) | ORAL | Status: DC | PRN
  Administered 2022-12-19 – 2022-12-20 (×2): 5 mL via ORAL
  Filled 2022-12-19 (×2): qty 5

## 2022-12-19 MED ORDER — APIXABAN 5 MG PO TABS
5.0000 mg | ORAL_TABLET | Freq: Two times a day (BID) | ORAL | Status: DC
  Administered 2022-12-19 – 2022-12-21 (×5): 5 mg via ORAL
  Filled 2022-12-19 (×5): qty 1

## 2022-12-19 MED ORDER — ACETAMINOPHEN 650 MG RE SUPP: 650.0000 mg | Freq: Four times a day (QID) | RECTAL | Status: DC | PRN

## 2022-12-19 MED ORDER — LEVOTHYROXINE SODIUM 50 MCG PO TABS
50.0000 ug | ORAL_TABLET | Freq: Every day | ORAL | Status: DC
  Administered 2022-12-19 – 2022-12-21 (×3): 50 ug via ORAL
  Filled 2022-12-19 (×3): qty 1

## 2022-12-19 MED ORDER — IPRATROPIUM-ALBUTEROL 0.5-2.5 (3) MG/3ML IN SOLN: 3.0000 mL | RESPIRATORY_TRACT | Status: DC | PRN

## 2022-12-19 MED ORDER — DEXTROSE 5 % IV SOLN: 500.0000 mg | INTRAVENOUS | Status: DC

## 2022-12-19 MED ORDER — ONDANSETRON HCL 4 MG PO TABS: 4.0000 mg | ORAL_TABLET | Freq: Four times a day (QID) | ORAL | Status: DC | PRN

## 2022-12-19 MED ORDER — ACETAMINOPHEN 325 MG PO TABS
650.0000 mg | ORAL_TABLET | Freq: Four times a day (QID) | ORAL | Status: DC | PRN
  Administered 2022-12-19: 650 mg via ORAL
  Filled 2022-12-19: qty 2

## 2022-12-19 MED ORDER — POTASSIUM CHLORIDE CRYS ER 10 MEQ PO TBCR
10.0000 meq | EXTENDED_RELEASE_TABLET | Freq: Two times a day (BID) | ORAL | Status: DC
  Filled 2022-12-19: qty 1

## 2022-12-19 MED ORDER — VITAMIN D 25 MCG (1000 UNIT) PO TABS
2000.0000 [IU] | ORAL_TABLET | Freq: Every day | ORAL | Status: DC
  Administered 2022-12-19 – 2022-12-21 (×3): 2000 [IU] via ORAL
  Filled 2022-12-19 (×3): qty 2

## 2022-12-19 NOTE — ED Notes (Signed)
Patient eating bagel brought by granddaughter at this time

## 2022-12-19 NOTE — ED Notes (Signed)
Patient up to restroom.

## 2022-12-19 NOTE — Assessment & Plan Note (Addendum)
-   This likely secondary to clinical pneumonia. - The patient will be admitted to a progressive unit bed. - Will continue antibiotic therapy with IV Rocephin and Zithromax. - Mucolytic therapy be provided as well as duo nebs q.i.d. and q.4 hours p.r.n. - We will follow blood cultures. - Sepsis manifested by tachycardia and tachypnea. - She will be hydrated with IV lactated Ringer.

## 2022-12-19 NOTE — Assessment & Plan Note (Signed)
-   Potassium will be replaced and magnesium level will be checked. ?

## 2022-12-19 NOTE — Consult Note (Signed)
PHARMACIST - PHYSICIAN COMMUNICATION CONCERNING: Antibiotic IV to Oral Route Change Policy  RECOMMENDATION: This patient is receiving azithromycin intravenously. Based on criteria approved by the Pharmacy and Therapeutics Committee, the antibiotic(s) is/are being converted to the equivalent dose of an oral formulation.   DESCRIPTION: These criteria include: Patient being treated for a respiratory tract infection, urinary tract infection, cellulitis or Clostridioides difficile-associated diarrhea if on metronidazole. The patient is not neutropenic and does not exhibit a malabsorptive GI state. The patient is eating (either orally or via tube) and/or has been taking other orally administered medications for at least 24 hours. The patient is improving clinically and has a 24-hour Tmax of <100.5 F.  If you have questions about this conversion, please contact the Pharmacy Department:  [x]   [phone] )  [facility] []   [phone] )  [facility] []   [phone] )  [facility]  []   [phone] )  [facility]   Will M. Dareen Piano, PharmD Clinical Pharmacist 12/19/2022 6:16 PM

## 2022-12-19 NOTE — Consult Note (Signed)
PHARMACY CONSULT NOTE - ELECTROLYTES  Pharmacy Consult for Electrolyte Monitoring and Replacement   Recent Labs: Height: 5\' 3"  (160 cm) Weight: 122.5 kg (270 lb) IBW/kg (Calculated) : 52.4 Estimated Creatinine Clearance: 79.5 mL/min (by C-G formula based on SCr of 0.59 mg/dL). Potassium (mmol/L)  Date Value  12/19/2022 3.4 (L)  12/01/2013 3.5   Magnesium (mg/dL)  Date Value  16/11/9602 1.8   Calcium (mg/dL)  Date Value  54/10/8117 8.1 (L)   Calcium, Total (mg/dL)  Date Value  14/78/2956 8.7   Albumin (g/dL)  Date Value  21/30/8657 3.7   Sodium (mmol/L)  Date Value  12/19/2022 138  12/01/2013 142   Assessment  Bonnie Shaw is a 72 y.o. female presenting with sepsis. PMH significant for HTN, HLD, hypothyroidism. Pharmacy has been consulted to monitor and replace electrolytes.  Diet: Cardiac MIVF: NS @ 150 mL/hr Pertinent medications: n/a  Goal of Therapy: Electrolytes WNL  Plan:  K 3.4 >> potassium chloride 40 mEq x 1 ordered by primary team No other electrolyte replacement currently warranted Check BMP, Mg, Phos with AM labs  Thank you for allowing pharmacy to be a part of this patient's care.  Will M. Dareen Piano, PharmD Clinical Pharmacist 12/19/2022 10:10 AM

## 2022-12-19 NOTE — Sepsis Progress Note (Signed)
Notified bedside nurse of need to draw 2nd lactic acid.

## 2022-12-19 NOTE — ED Notes (Signed)
MD Nelson Chimes, Ankit at bedside

## 2022-12-19 NOTE — Progress Notes (Signed)
PROGRESS NOTE    Bonnie Shaw  YQM:[id number] DOB: May 08, 1950 DOA: 12/18/2022 PCP: Dale Lisle, MD    Brief Narrative:  72 year old with history of OA, HTN, HLD, hypothyroidism, panic disorder comes to the ED with generalized weakness.  Diagnosis of COVID-19 in September later had pneumonia associated with dyspnea and wheezing.  During this time she has had fevers and chills at home.  Upon admission found to hypokalemia, possible pneumonia and was in atrial fibrillation with RVR.   Assessment & Plan:  Principal Problem:   Sepsis due to undetermined organism [facility]) Active Problems:   Hypothyroidism   Hypokalemia   Chronic atrial fibrillation with RVR (HCC)   Anxiety and depression   Dyslipidemia    Sepsis due to undetermined organism (HCC) Suspicion for pneumonia.  Currently on Rocephin and azithromycin.  Will check  BNP.  ProCal 0.18. Bronchodilators, I-S/flutter valve    Hypokalemia Replete as needed   Hypothyroidism Synthroid   Dyslipidemia Lipitor   Anxiety and depression Zoloft, as needed Xanax   Chronic atrial fibrillation with RVR (HCC) Responded to IV fluids.  Continue Eliquis    DVT prophylaxis: apixaban (ELIQUIS) tablet 5 mg  Code Status: Full code Family Communication: Family at bedside Status is: Inpatient Remains inpatient appropriate because: Abnormal BS cont hosp stay.  Hopefully home tomorrow    Subjective: Feeling better, no new complaints this morning. Overall still feels slightly weak.  Examination:  General exam: Appears calm and comfortable  Respiratory system: Very mild basilar rhonchi Cardiovascular system: S1 & S2 heard, RRR. No JVD, murmurs, rubs, gallops or clicks. No pedal edema. Gastrointestinal system: Abdomen is nondistended, soft and nontender. No organomegaly or masses felt. Normal bowel sounds heard. Central nervous system: Alert and oriented. No focal neurological deficits. Extremities: Symmetric 5 x 5 power. Skin: No  rashes, lesions or ulcers Psychiatry: Judgement and insight appear normal. Mood & affect appropriate.                 Diet Orders (From admission, onward)     Start     Ordered   12/19/22 0314  Diet Heart Room service appropriate? Yes; Fluid consistency: Thin  Diet effective now       Question Answer Comment  Room service appropriate? Yes   Fluid consistency: Thin      12/19/22 0314            Objective: Vitals:   12/19/22 1000 12/19/22 1030 12/19/22 1100 12/19/22 1124  BP: 116/60 110/64 110/73   Pulse: 100 (!) 104 97 (!) 101  Resp: (!) 21 16 (!) 23 (!) 24  Temp:      TempSrc:      SpO2: 100% 98% 97% 95%  Weight:      Height:        Intake/Output Summary (Last 24 hours) at 12/19/2022 1327 Last data filed at 12/19/2022 0225 Gross per 24 hour  Intake 2507.39 ml  Output --  Net 2507.39 ml   Filed Weights   12/18/22 2211  Weight: 122.5 kg    Scheduled Meds:  apixaban  5 mg Oral BID   atorvastatin  20 mg Oral Daily   cholecalciferol  2,000 Units Oral Daily   levothyroxine  50 mcg Oral Q0600   sertraline  50 mg Oral Daily   Continuous Infusions:  sodium chloride Stopped (12/19/22 1112)   azithromycin Stopped (12/19/22 0121)   cefTRIAXone (ROCEPHIN)  IV Stopped (12/19/22 0018)   magnesium sulfate bolus IVPB  Nutritional status     Body mass index is 47.83 kg/m.  Data Reviewed:   CBC: Recent Labs  Lab 12/18/22 2222 12/19/22 0602  WBC 10.3 7.5  NEUTROABS 7.4  --   HGB 13.8 11.9*  HCT 39.5 34.9*  MCV 94.0 96.4  PLT 173 120*   Basic Metabolic Panel: Recent Labs  Lab 12/18/22 2222 12/19/22 0602  NA 132* 138  K 3.0* 3.4*  CL 100 104  CO2 21* 25  GLUCOSE 187* 103*  BUN 12 10  CREATININE 0.77 0.59  CALCIUM 8.7* 8.1*  MG  --  1.6*  PHOS  --  2.9   GFR: Estimated Creatinine Clearance: 79.5 mL/min (by C-G formula based on SCr of 0.59 mg/dL). Liver Function Tests: Recent Labs  Lab 12/18/22 2222  AST 19  ALT 16   ALKPHOS 44  BILITOT 2.4*  PROT 6.4*  ALBUMIN 3.7   No results for input(s): "LIPASE", "AMYLASE" in the last 168 hours. No results for input(s): "AMMONIA" in the last 168 hours. Coagulation Profile: Recent Labs  Lab 12/18/22 2222 12/19/22 0602  INR 1.3* 1.3*   Cardiac Enzymes: No results for input(s): "CKTOTAL", "CKMB", "CKMBINDEX", "TROPONINI" in the last 168 hours. BNP (last 3 results) No results for input(s): "PROBNP" in the last 8760 hours. HbA1C: No results for input(s): "HGBA1C" in the last 72 hours. CBG: No results for input(s): "GLUCAP" in the last 168 hours. Lipid Profile: No results for input(s): "CHOL", "HDL", "LDLCALC", "TRIG", "CHOLHDL", "LDLDIRECT" in the last 72 hours. Thyroid Function Tests: No results for input(s): "TSH", "T4TOTAL", "FREET4", "T3FREE", "THYROIDAB" in the last 72 hours. Anemia Panel: No results for input(s): "VITAMINB12", "FOLATE", "FERRITIN", "TIBC", "IRON", "RETICCTPCT" in the last 72 hours. Sepsis Labs: Recent Labs  Lab 12/18/22 2222 12/19/22 0136 12/19/22 0602  PROCALCITON  --   --  0.18  LATICACIDVEN 2.1* 1.7  --     Recent Results (from the past 240 hour(s))  Culture, blood (Routine x 2)     Status: None (Preliminary result)   Collection Time: 12/18/22 10:20 PM   Specimen: BLOOD  Result Value Ref Range Status   Specimen Description BLOOD BLOOD RIGHT FOREARM  Final   Special Requests   Final    BOTTLES DRAWN AEROBIC AND ANAEROBIC Blood Culture adequate volume   Culture   Final    NO GROWTH < 12 HOURS Performed at [facility], [address], [place], [place] [address]    Report Status PENDING  Incomplete  Culture, blood (Routine x 2)     Status: None (Preliminary result)   Collection Time: 12/18/22 10:46 PM   Specimen: BLOOD  Result Value Ref Range Status   Specimen Description BLOOD BLOOD LEFT ARM  Final   Special Requests   Final    BOTTLES DRAWN AEROBIC AND ANAEROBIC Blood Culture results may not be optimal  due to an excessive volume of blood received in culture bottles   Culture   Final    NO GROWTH < 12 HOURS Performed at [facility], [address] [place], [place] [address]    Report Status PENDING  Incomplete  Resp panel by RT-PCR (RSV, Flu A&B, Covid) Urine, Catheterized     Status: None   Collection Time: 12/18/22 10:46 PM   Specimen: Urine, Catheterized; Nasal Swab  Result Value Ref Range Status   SARS Coronavirus 2 by RT PCR NEGATIVE NEGATIVE Final    Comment: (NOTE) SARS-CoV-2 target nucleic acids are NOT DETECTED.  The SARS-CoV-2 RNA  is generally detectable in upper respiratory specimens during the acute phase of infection. The lowest concentration of SARS-CoV-2 viral copies this assay can detect is 138 copies/mL. A negative result does not preclude SARS-Cov-2 infection and should not be used as the sole basis for treatment or other patient management decisions. A negative result may occur with  improper specimen collection/handling, submission of specimen other than nasopharyngeal swab, presence of viral mutation(s) within the areas targeted by this assay, and inadequate number of viral copies(<138 copies/mL). A negative result must be combined with clinical observations, patient history, and epidemiological information. The expected result is Negative.  Fact Sheet for Patients:  BloggerCourse.com  Fact Sheet for Healthcare Providers:  SeriousBroker.it  This test is no t yet approved or cleared by the [place] FDA and  has been authorized for detection and/or diagnosis of SARS-CoV-2 by FDA under an Emergency Use Authorization (EUA). This EUA will remain  in effect (meaning this test can be used) for the duration of the COVID-19 declaration under Section 564(b)(1) of the Act, 21 U.S.C.section 360bbb-3(b)(1), unless the authorization is terminated  or revoked sooner.       Influenza A by PCR NEGATIVE  NEGATIVE Final   Influenza B by PCR NEGATIVE NEGATIVE Final    Comment: (NOTE) The Xpert Xpress SARS-CoV-2/FLU/RSV plus assay is intended as an aid in the diagnosis of influenza from Nasopharyngeal swab specimens and should not be used as a sole basis for treatment. Nasal washings and aspirates are unacceptable for Xpert Xpress SARS-CoV-2/FLU/RSV testing.  Fact Sheet for Patients: BloggerCourse.com  Fact Sheet for Healthcare Providers: SeriousBroker.it  This test is not yet approved or cleared by the [place] FDA and has been authorized for detection and/or diagnosis of SARS-CoV-2 by FDA under an Emergency Use Authorization (EUA). This EUA will remain in effect (meaning this test can be used) for the duration of the COVID-19 declaration under Section 564(b)(1) of the Act, 21 U.S.C. section 360bbb-3(b)(1), unless the authorization is terminated or revoked.     Resp Syncytial Virus by PCR NEGATIVE NEGATIVE Final    Comment: (NOTE) Fact Sheet for Patients: BloggerCourse.com  Fact Sheet for Healthcare Providers: SeriousBroker.it  This test is not yet approved or cleared by the [place] FDA and has been authorized for detection and/or diagnosis of SARS-CoV-2 by FDA under an Emergency Use Authorization (EUA). This EUA will remain in effect (meaning this test can be used) for the duration of the COVID-19 declaration under Section 564(b)(1) of the Act, 21 U.S.C. section 360bbb-3(b)(1), unless the authorization is terminated or revoked.  Performed at [facility], [address] [place], [place] [address]          Radiology Studies: DG Chest [place] 1 View  Result Date: 12/18/2022 CLINICAL DATA:  Sepsis EXAM: PORTABLE CHEST 1 VIEW COMPARISON:  12/12/2022 FINDINGS: Mild cardiomegaly. No focal opacity or pleural effusion. No pneumothorax IMPRESSION: No active  disease. Mild cardiomegaly. Electronically Signed   By: Jasmine Pang M.D.   On: 12/18/2022 23:50           LOS: 0 days   Time spent= 35 mins    Miguel Rota, MD [facility]  If 7PM-7AM, please contact night-coverage  12/19/2022, 1:27 PM

## 2022-12-19 NOTE — H&P (Signed)
[place]   PATIENT NAME: Bonnie Shaw    MR#:  [id number]  DATE OF BIRTH:  March 28, 1950  DATE OF ADMISSION:  12/18/2022  PRIMARY CARE PHYSICIAN: Dale [place], MD   Patient is coming from: Home  REQUESTING/REFERRING PHYSICIAN: Chiquita Loth, MD  CHIEF COMPLAINT:   Chief Complaint  Patient presents with   Weakness    HISTORY OF PRESENT ILLNESS:  Bonnie Shaw is a 72 y.o. female with medical history significant for osteoarthritis, hypertension, dyslipidemia, hypothyroidism and panic disorder, who presented to the emergency room with acute onset of generalized weakness and feeling sick.  She was diagnosed with COVID-19 in September and later had pneumonia.  During the last few days she has been having croupy cough with no expectoration with associated dyspnea with occasional wheezing.  She denied any nausea or vomiting or abdominal pain.  She had fever and chills at home with a Tmax of 103.  Approximately was 90% on room air here and 94% on 2 L of O2 by nasal cannula.  She admits to urinary frequency without dysuria or hematuria or flank pain.  She has been having diminished appetite.  Due to significant generalized weakness it was difficult for her to ambulate.  ED Course: When she came to the ER, BP was 138/112 with a heart rate of 148 and otherwise normal vital signs.  Tension was normal and later 99.9 and heart rate has come down to 93 with hydration.  Labs revealed hypokalemia and hyponatremia with calcium of 8.7 total bili of 6.2 and total bili of 2.4.  Lactic acid was 2.1 and later 1.7.  CBC was normal.  INR was 1.3 with PT of 16.1 and respiratory panel came back negative.  UA showed 0-5 WBCs with rare bacteria and 30 protein.  Blood cultures were sent. EKG as reviewed by me :  EKG showed atrial fibrillation with RVR 157 with PVCs. Imaging: 4 chest x-ray showed no acute cardiopulmonary disease.  The patient was given IV Rocephin and Zithromax, Tussionex, 1 L bolus of IV  normal saline and 20 mill [place] IV potassium chloride.  She will be admitted to a progressive unit bed for further evaluation and management. PAST MEDICAL HISTORY:   Past Medical History:  Diagnosis Date   Anxiety    Arthritis    Cataract    Complication of anesthesia    Woke up during colonoscopy and during Knee surgery. 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   Hyperlipidemia    Hypertension    Hyperthyroidism    s/p RAI therapy   Hypothyroidism    s/p ablation   Left atrial enlargement    severely enlarged   Migraine headache    2x/yr   Motion sickness    roller coasters   Obesity    Panic disorder    Persistent atrial fibrillation (HCC)    Snoring     PAST SURGICAL HISTORY:   Past Surgical History:  Procedure Laterality Date   ABDOMINAL HYSTERECTOMY     CARDIOVERSION N/A 09/11/2017   Procedure: CARDIOVERSION;  Surgeon: Chilton Si, MD;  Location: [facility] ENDOSCOPY;  Service: Cardiovascular;  Laterality: N/A;   CATARACT EXTRACTION     COLONOSCOPY WITH PROPOFOL N/A 03/11/2019   Procedure: COLONOSCOPY WITH PROPOFOL;  Surgeon: Wyline Mood, MD;  Location: [facility];  Service: Endoscopy;  Laterality: N/A;  colon   COLONOSCOPY WITH PROPOFOL N/A 07/30/2022   Procedure: COLONOSCOPY WITH PROPOFOL;  Surgeon: Toledo, Boykin Nearing, MD;  Location: [facility] ENDOSCOPY;  Service: Gastroenterology;  Laterality: N/A;   KNEE SURGERY Bilateral    replacements   POLYPECTOMY  03/11/2019   Procedure: POLYPECTOMY;  Surgeon: Wyline Mood, MD;  Location: [facility];  Service: Endoscopy;;   THYROID ABLATION  2010    SOCIAL HISTORY:   Social History   Tobacco Use   Smoking status: Never   Smokeless tobacco: Never  Substance Use Topics   Alcohol use: No    FAMILY HISTORY:   Family History  Problem Relation Age of Onset   Alzheimer's disease Father    Stroke  Mother    Cancer Sister        ovarian    Ovarian cancer Sister 60   Breast cancer Neg Hx     DRUG ALLERGIES:   Allergies  Allergen Reactions   Ativan [Lorazepam] Other (See Comments)    High sensitivity, cause pt to pass out   Valium [Diazepam] Other (See Comments)    Hallucinations, tremors, slurred speech   Meloxicam Other (See Comments)    Hallucinations   Trimethoprim Other (See Comments)   Sulfamethoxazole Rash   Sulfonamide Derivatives Rash    REVIEW OF SYSTEMS:   ROS As per history of present illness. All pertinent systems were reviewed above. Constitutional, HEENT, cardiovascular, respiratory, GI, GU, musculoskeletal, neuro, psychiatric, endocrine, integumentary and hematologic systems were reviewed and are otherwise negative/unremarkable except for positive findings mentioned above in the HPI.   MEDICATIONS AT HOME:   Prior to Admission medications   Medication Sig Start Date End Date Taking? Authorizing Provider  cholecalciferol (VITAMIN D3) 25 MCG (1000 UT) tablet Take 1,000 Units by mouth daily. Taking 2,000mg  daily   Yes [provider]  ELIQUIS 5 MG TABS tablet TAKE 1 TABLET BY MOUTH TWICE  DAILY 11/21/22  Yes Dale Barnwell, MD  hydrochlorothiazide (HYDRODIURIL) 25 MG tablet TAKE 1 TABLET BY MOUTH DAILY 09/23/22  Yes Dale [place], MD  levothyroxine (SYNTHROID) 50 MCG tablet Take 1 tablet (50 mcg total) by mouth daily. 09/25/22  Yes Dale [place], MD  nystatin (MYCOSTATIN) 100000 UNIT/ML suspension Take 5 mLs (500,000 Units total) by mouth in the morning, at noon, and at bedtime. Swish and spit tid. 11/14/22  Yes Dale Borden, MD  potassium chloride (KLOR-CON) 10 MEQ tablet TAKE 1 TABLET BY MOUTH TWICE  DAILY 09/23/22  Yes Dale [place], MD  ALPRAZolam Prudy Feeler) 1 MG tablet Take 1 mg by mouth at bedtime as needed.    [provider]  atorvastatin (LIPITOR) 20 MG tablet TAKE 1 TABLET BY MOUTH ONCE  DAILY Patient not taking: Reported on  12/19/2022 09/12/22   Dale [place], MD  sertraline (ZOLOFT) 50 MG tablet TAKE 1 TABLET BY MOUTH DAILY 09/23/22   Dale Andrews, MD      VITAL SIGNS:  Blood pressure 108/68, pulse 100, temperature 99.9 F (37.7 C), temperature source Oral, resp. rate 18, height 5\' 3"  (1.6 m), weight 122.5 kg, SpO2 100%.  PHYSICAL EXAMINATION:  Physical Exam  GENERAL:  72 y.o.-year-old patient lying in the bed with no acute distress.  EYES: Pupils equal, round, reactive to light and accommodation. No scleral icterus. Extraocular muscles intact.  HEENT: Head atraumatic, normocephalic. Oropharynx and nasopharynx clear.  NECK:  Supple, no jugular venous distention. No thyroid enlargement, no tenderness.  LUNGS: Slightly diminished bibasal breath sounds with bibasal crackles.. No use  of accessory muscles of respiration.  CARDIOVASCULAR: Regular rate and rhythm, S1, S2 normal. No murmurs, rubs, or gallops.  ABDOMEN: Soft, nondistended, nontender. Bowel sounds present. No organomegaly or mass.  EXTREMITIES: No pedal edema, cyanosis, or clubbing.  NEUROLOGIC: Cranial nerves II through XII are intact. Muscle strength 5/5 in all extremities. Sensation intact. Gait not checked.  PSYCHIATRIC: The patient is alert and oriented x 3.  Normal affect and good eye contact. SKIN: No obvious rash, lesion, or ulcer.   LABORATORY PANEL:   CBC Recent Labs  Lab 12/19/22 0602  WBC 7.5  HGB 11.9*  HCT 34.9*  PLT 120*   ------------------------------------------------------------------------------------------------------------------  Chemistries  Recent Labs  Lab 12/18/22 2222 12/19/22 0602  NA 132* 138  K 3.0* 3.4*  CL 100 104  CO2 21* 25  GLUCOSE 187* 103*  BUN 12 10  CREATININE 0.77 0.59  CALCIUM 8.7* 8.1*  AST 19  --   ALT 16  --   ALKPHOS 44  --   BILITOT 2.4*  --    ------------------------------------------------------------------------------------------------------------------  Cardiac  Enzymes No results for input(s): "TROPONINI" in the last 168 hours. ------------------------------------------------------------------------------------------------------------------  RADIOLOGY:  DG Chest Port 1 View  Result Date: 12/18/2022 CLINICAL DATA:  Sepsis EXAM: PORTABLE CHEST 1 VIEW COMPARISON:  12/12/2022 FINDINGS: Mild cardiomegaly. No focal opacity or pleural effusion. No pneumothorax IMPRESSION: No active disease. Mild cardiomegaly. Electronically Signed   By: Jasmine Pang M.D.   On: 12/18/2022 23:50      IMPRESSION AND PLAN:  Assessment and Plan: * Sepsis due to undetermined organism [facility]) - This likely secondary to clinical pneumonia. - The patient will be admitted to a progressive unit bed. - Will continue antibiotic therapy with IV Rocephin and Zithromax. - Mucolytic therapy be provided as well as duo nebs q.i.d. and q.4 hours p.r.n. - We will follow blood cultures. - Sepsis manifested by tachycardia and tachypnea. - She will be hydrated with IV lactated Ringer.   Hypokalemia - Potassium will be replaced and magnesium level will be checked.  Hypothyroidism - We will continue Synthroid.  Dyslipidemia - We will continue statin therapy.  Anxiety and depression - We will continue Xanax and Zoloft  Chronic atrial fibrillation with RVR (HCC) - This is significantly responded to IV fluids. - We will continue Eliquis and optimize potassium replacement.       DVT prophylaxis: Lovenox.  Advanced Care Planning:  Code Status: full code.  Family Communication:  The plan of care was discussed in details with the patient (and family). I answered all questions. The patient agreed to proceed with the above mentioned plan. Further management will depend upon hospital course. Disposition Plan: Back to previous home environment Consults called: none.  All the records are reviewed and case discussed with ED provider.  Status is: Inpatient    At the time of the  admission, it appears that the appropriate admission status for this patient is inpatient.  This is judged to be reasonable and necessary in order to provide the required intensity of service to ensure the patient's safety given the presenting symptoms, physical exam findings and initial radiographic and laboratory data in the context of comorbid conditions.  The patient requires inpatient status due to high intensity of service, high risk of further deterioration and high frequency of surveillance required.  I certify that at the time of admission, it is my clinical judgment that the patient will require inpatient hospital care extending more than 2 midnights.  Dispo: The patient is from: Home              Anticipated d/c is to: Home              Patient currently is not medically stable to d/c.              Difficult to place patient: No  Hannah Beat M.D on 12/19/2022 at 7:17 AM  [facility]   From 7 PM-7 AM, contact night-coverage [email or web address]  CC: Primary care physician; Dale Litchfield, MD

## 2022-12-19 NOTE — Assessment & Plan Note (Signed)
-   This is significantly responded to IV fluids. - We will continue Eliquis and optimize potassium replacement.

## 2022-12-19 NOTE — Assessment & Plan Note (Signed)
-   We will continue Xanax and Zoloft.

## 2022-12-19 NOTE — Assessment & Plan Note (Signed)
-   We will continue statin therapy. 

## 2022-12-19 NOTE — Assessment & Plan Note (Signed)
-   We will continue Synthroid. 

## 2022-12-19 NOTE — Hospital Course (Addendum)
Brief Narrative:  72 year old with history of OA, HTN, HLD, hypothyroidism, panic disorder comes to the ED with generalized weakness.  Diagnosis of COVID-19 in September later had pneumonia associated with dyspnea and wheezing.  During this time she has had fevers and chills at home.  Upon admission found to hypokalemia, possible pneumonia and was in atrial fibrillation with RVR.   Assessment & Plan:  Principal Problem:   Sepsis due to undetermined organism [facility]) Active Problems:   Hypothyroidism   Hypokalemia   Chronic atrial fibrillation with RVR (HCC)   Anxiety and depression   Dyslipidemia    Sepsis due to undetermined organism (HCC) Suspicion for pneumonia.  Currently on Rocephin and azithromycin.  Will check  BNP.  ProCal 0.18. Bronchodilators, I-S/flutter valve    Hypokalemia Replete as needed   Hypothyroidism Synthroid   Dyslipidemia Lipitor   Anxiety and depression Zoloft, as needed Xanax   Chronic atrial fibrillation with RVR (HCC) Responded to IV fluids.  Continue Eliquis    DVT prophylaxis: apixaban (ELIQUIS) tablet 5 mg  Code Status: Full code Family Communication: Family at bedside Status is: Inpatient Remains inpatient appropriate because: Abnormal BS cont hosp stay.  Hopefully home tomorrow    Subjective: Feeling better, no new complaints this morning. Overall still feels slightly weak.  Examination:  General exam: Appears calm and comfortable  Respiratory system: Very mild basilar rhonchi Cardiovascular system: S1 & S2 heard, RRR. No JVD, murmurs, rubs, gallops or clicks. No pedal edema. Gastrointestinal system: Abdomen is nondistended, soft and nontender. No organomegaly or masses felt. Normal bowel sounds heard. Central nervous system: Alert and oriented. No focal neurological deficits. Extremities: Symmetric 5 x 5 power. Skin: No rashes, lesions or ulcers Psychiatry: Judgement and insight appear normal. Mood & affect appropriate.

## 2022-12-20 DIAGNOSIS — A419 Sepsis, unspecified organism: Secondary | ICD-10-CM | POA: Diagnosis not present

## 2022-12-20 DIAGNOSIS — R0902 Hypoxemia: Secondary | ICD-10-CM

## 2022-12-20 DIAGNOSIS — I4891 Unspecified atrial fibrillation: Secondary | ICD-10-CM

## 2022-12-20 LAB — CBC
HCT: 34.3 % — ABNORMAL LOW (ref 36.0–46.0)
Hemoglobin: 11.7 g/dL — ABNORMAL LOW (ref 12.0–15.0)
MCH: 32.9 pg (ref 26.0–34.0)
MCHC: 34.1 g/dL (ref 30.0–36.0)
MCV: 96.3 fL (ref 80.0–100.0)
Platelets: 123 10*3/uL — ABNORMAL LOW (ref 150–400)
RBC: 3.56 MIL/uL — ABNORMAL LOW (ref 3.87–5.11)
RDW: 13.2 % (ref 11.5–15.5)
WBC: 8 10*3/uL (ref 4.0–10.5)
nRBC: 0 % (ref 0.0–0.2)

## 2022-12-20 LAB — BASIC METABOLIC PANEL
Anion gap: 6 (ref 5–15)
BUN: 7 mg/dL — ABNORMAL LOW (ref 8–23)
CO2: 27 mmol/L (ref 22–32)
Calcium: 8.1 mg/dL — ABNORMAL LOW (ref 8.9–10.3)
Chloride: 106 mmol/L (ref 98–111)
Creatinine, Ser: 0.62 mg/dL (ref 0.44–1.00)
GFR, Estimated: >60 mL/min (ref >60)
Glucose, Bld: 113 mg/dL — ABNORMAL HIGH (ref 70–99)
Potassium: 3.6 mmol/L (ref 3.5–5.1)
Sodium: 139 mmol/L (ref 135–145)

## 2022-12-20 LAB — PHOSPHORUS: Phosphorus: 2.5 mg/dL (ref 2.5–4.6)

## 2022-12-20 LAB — MAGNESIUM: Magnesium: 2.2 mg/dL (ref 1.7–2.4)

## 2022-12-20 MED ORDER — METOPROLOL TARTRATE 25 MG PO TABS: 12.5000 mg | ORAL_TABLET | Freq: Once | ORAL | Status: DC

## 2022-12-20 MED ORDER — METOPROLOL TARTRATE 25 MG PO TABS
12.5000 mg | ORAL_TABLET | Freq: Two times a day (BID) | ORAL | Status: DC
  Administered 2022-12-20: 12.5 mg via ORAL
  Filled 2022-12-20: qty 1

## 2022-12-20 MED ORDER — METOPROLOL TARTRATE 25 MG PO TABS: 25.0000 mg | ORAL_TABLET | Freq: Two times a day (BID) | ORAL | Status: DC

## 2022-12-20 MED ORDER — METOPROLOL TARTRATE 25 MG PO TABS
12.5000 mg | ORAL_TABLET | Freq: Two times a day (BID) | ORAL | Status: DC
  Administered 2022-12-21: 12.5 mg via ORAL
  Filled 2022-12-20: qty 1

## 2022-12-20 NOTE — Plan of Care (Signed)

## 2022-12-20 NOTE — Consult Note (Signed)
Cardiology Consultation   Patient ID: ERNISHA CALABAZA MRN: [id number]; DOB: 1950/10/21  Admit date: 12/18/2022 Date of Consult: 12/20/2022  PCP:  Dale Edmonds, MD   [facility] Providers Cardiologist: new to [facility] Reason for consult: Atrial fibrillation with RVR Physician requesting consult; Dr. Nelson Chimes   Patient Profile:   Bonnie Shaw is a 72 y.o. female with a hx of permanent atrial fibrillation, obesity, anxiety/panic disorder presenting to the ER for persistent weakness, shortness of breath  History of Present Illness:   Bonnie Shaw reports long history of atrial fibrillation previously managed by A-fib clinic in [place], Dr. Johney Frame and Rudi Coco.  4 years or so ago medications were held, as she was asymptomatic with atrial fibrillation.  Reports that she came off her beta-blockers as rate was relatively well-controlled She was previously on metoprolol  Presenting to the emergency room December 18, 2022 with weakness, confusion, cough, continued chest congestion Symptoms worse after taking a Xanax Reports having COVID 2 months ago, then was treated for pneumonia Reports episodes of urinary incontinence in the setting of coughing which bothered her As she felt persistently weak she presented to the emergency room In the ER noted to have heart rates up to 140s A-fib  Chest x-ray nonacute Potassium 3.0, lactic acid 2.1, Blood cultures obtained Started on broad-spectrum antibiotics Noted to have saturations 90% on room air, placed on nasal cannula oxygen up to 94%  Overnight on telemetry rate relatively well-controlled at rest, though rapid rate on exertion to the bathroom rates in the 140 range    Past Medical History:  Diagnosis Date   Anxiety    Arthritis    Cataract    Complication of anesthesia    Woke up during colonoscopy and during Knee surgery. Also says "nitrous makes me crazy"   Family history of adverse reaction to anesthesia    son  woke up during TEE   Guillain-Barre (HCC) 2018   recovered   Hyperlipidemia    Hypertension    Hyperthyroidism    s/p RAI therapy   Hypothyroidism    s/p ablation   Left atrial enlargement    severely enlarged   Migraine headache    2x/yr   Motion sickness    roller coasters   Obesity    Panic disorder    Persistent atrial fibrillation (HCC)    Snoring     Past Surgical History:  Procedure Laterality Date   ABDOMINAL HYSTERECTOMY     CARDIOVERSION N/A 09/11/2017   Procedure: CARDIOVERSION;  Surgeon: Chilton Si, MD;  Location: [facility] ENDOSCOPY;  Service: Cardiovascular;  Laterality: N/A;   CATARACT EXTRACTION     COLONOSCOPY WITH PROPOFOL N/A 03/11/2019   Procedure: COLONOSCOPY WITH PROPOFOL;  Surgeon: Wyline Mood, MD;  Location: [facility];  Service: Endoscopy;  Laterality: N/A;  colon   COLONOSCOPY WITH PROPOFOL N/A 07/30/2022   Procedure: COLONOSCOPY WITH PROPOFOL;  Surgeon: Toledo, Boykin Nearing, MD;  Location: [facility] ENDOSCOPY;  Service: Gastroenterology;  Laterality: N/A;   KNEE SURGERY Bilateral    replacements   POLYPECTOMY  03/11/2019   Procedure: POLYPECTOMY;  Surgeon: Wyline Mood, MD;  Location: [facility];  Service: Endoscopy;;   THYROID ABLATION  2010     Home Medications:  Prior to Admission medications   Medication Sig Start Date End Date Taking? Authorizing Provider  cholecalciferol (VITAMIN D3) 25 MCG (1000 UT) tablet Take 1,000 Units by mouth daily. Taking 2,000mg  daily   Yes [provider]  Everlene Balls  5 MG TABS tablet TAKE 1 TABLET BY MOUTH TWICE  DAILY 11/21/22  Yes Dale Sachse, MD  hydrochlorothiazide (HYDRODIURIL) 25 MG tablet TAKE 1 TABLET BY MOUTH DAILY 09/23/22  Yes Dale [place], MD  levothyroxine (SYNTHROID) 50 MCG tablet Take 1 tablet (50 mcg total) by mouth daily. 09/25/22  Yes Dale [place], MD  nystatin (MYCOSTATIN) 100000 UNIT/ML suspension Take 5 mLs (500,000 Units total) by mouth in the morning, at noon, and at  bedtime. Swish and spit tid. 11/14/22  Yes Dale Jessie, MD  potassium chloride (KLOR-CON) 10 MEQ tablet TAKE 1 TABLET BY MOUTH TWICE  DAILY 09/23/22  Yes Dale [place], MD  ALPRAZolam Prudy Feeler) 1 MG tablet Take 1 mg by mouth at bedtime as needed.    [provider]  atorvastatin (LIPITOR) 20 MG tablet TAKE 1 TABLET BY MOUTH ONCE  DAILY Patient not taking: Reported on 12/19/2022 09/12/22   Dale [place], MD  sertraline (ZOLOFT) 50 MG tablet TAKE 1 TABLET BY MOUTH DAILY 09/23/22   Dale [place], MD    Inpatient Medications: Scheduled Meds:  apixaban  5 mg Oral BID   atorvastatin  20 mg Oral Daily   azithromycin  500 mg Oral Daily   cholecalciferol  2,000 Units Oral Daily   levothyroxine  50 mcg Oral Q0600   metoprolol tartrate  12.5 mg Oral BID   sertraline  50 mg Oral Daily   Continuous Infusions:  cefTRIAXone (ROCEPHIN)  IV Stopped (12/19/22 2341)   PRN Meds: acetaminophen **OR** acetaminophen, ALPRAZolam, chlorpheniramine-HYDROcodone, hydrALAZINE, ipratropium-albuterol, magnesium hydroxide, metoprolol tartrate, ondansetron **OR** ondansetron (ZOFRAN) IV, senna-docusate, traZODone  Allergies:    Allergies  Allergen Reactions   Ativan [Lorazepam] Other (See Comments)    High sensitivity, cause pt to pass out   Valium [Diazepam] Other (See Comments)    Hallucinations, tremors, slurred speech   Meloxicam Other (See Comments)    Hallucinations   Trimethoprim Other (See Comments)   Sulfamethoxazole Rash   Sulfonamide Derivatives Rash    Social History:   Social History   Socioeconomic History   Marital status: Married    Spouse name: Not on file   Number of children: 4   Years of education: Not on file   Highest education level: Not on file  Occupational History    Employer: UNEMPLOYED  Tobacco Use   Smoking status: Never   Smokeless tobacco: Never  Vaping Use   Vaping status: Never Used  Substance and Sexual Activity   Alcohol use: No   Drug use: No    Sexual activity: Not on file  Other Topics Concern   Not on file  Social History Narrative   Lives in [place] with husband, has 4 children and 15 grandkids         Attends [employer], 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[employer]   Retired [employer]   Social Determinants of Corporate investment banker Strain: Low Risk  (05/09/2022)   Overall Financial Resource Strain (CARDIA)    Difficulty of Paying Living Expenses: Not hard at all  Food Insecurity: No Food Insecurity (12/19/2022)   Hunger Vital Sign    Worried About Running Out of Food in the Last Year: Never true    Ran Out of Food in the Last Year: Never true  Transportation Needs: No Transportation Needs (12/19/2022)   PRAPARE - Administrator, Civil Service (Medical): No    Lack of Transportation (Non-Medical): No  Physical  Activity: Sufficiently Active (05/09/2022)   Exercise Vital Sign    Days of Exercise per Week: 5 days    Minutes of Exercise per Session: 30 min  Stress: No Stress Concern Present (05/09/2022)   [employer] of Occupational Health - Occupational Stress Questionnaire    Feeling of Stress : Not at all  Social Connections: Unknown (05/09/2022)   Social Connection and Isolation Panel [NHANES]    Frequency of Communication with Friends and Family: Not on file    Frequency of Social Gatherings with Friends and Family: Not on file    Attends Religious Services: Not on file    Active Member of Clubs or Organizations: Not on file    Attends Banker Meetings: Not on file    Marital Status: Married  Intimate Partner Violence: Not At Risk (12/19/2022)   Humiliation, Afraid, Rape, and Kick questionnaire    Fear of Current or Ex-Partner: No    Emotionally Abused: No    Physically Abused: No    Sexually Abused: No    Family History:    Family History  Problem Relation Age of Onset   Alzheimer's disease Father    Stroke Mother    Cancer Sister         ovarian    Ovarian cancer Sister 63   Breast cancer Neg Hx      ROS:  Please see the history of present illness.  Review of Systems  Constitutional:  Positive for malaise/fatigue.  HENT: Negative.    Respiratory:  Positive for cough and shortness of breath.   Cardiovascular: Negative.   Gastrointestinal: Negative.   Musculoskeletal: Negative.   Neurological: Negative.   Psychiatric/Behavioral: Negative.    All other systems reviewed and are negative.   All other ROS reviewed and negative.     Physical Exam/Data:   Vitals:   12/20/22 0000 12/20/22 0437 12/20/22 0916 12/20/22 1321  BP: 128/76 115/76 129/78 (!) 114/59  Pulse: 100 96 (!) 103 (!) 102  Resp: 20  (!) 22 20  Temp: 99.4 F (37.4 C) 97.8 F (36.6 C) (!) 97.5 F (36.4 C) 99 F (37.2 C)  TempSrc: Oral Oral    SpO2: 93% 96% 94% 95%  Weight:      Height:       No intake or output data in the 24 hours ending 12/20/22 1902    12/18/2022   10:11 PM 11/14/2022    1:15 PM 07/30/2022    7:35 AM  Last 3 Weights  Weight (lbs) 270 lb 232 lb 226 lb 9.6 oz  Weight (kg) 122.471 kg 105.235 kg 102.785 kg     Body mass index is 47.83 kg/m.  General:  Well nourished, well developed, in no acute distress HEENT: normal Neck: no JVD Vascular: No carotid bruits; Distal pulses 2+ bilaterally Cardiac: Irregularly irregular no murmur  Lungs:  clear to auscultation bilaterally, no wheezing, rhonchi or rales  Abd: soft, nontender, no hepatomegaly  Ext: no edema Musculoskeletal:  No deformities, BUE and BLE strength normal and equal Skin: warm and dry  Neuro:  CNs 2-12 intact, no focal abnormalities noted Psych:  Normal affect   EKG:  The EKG was personally reviewed and demonstrates:   Atrial fibrillation ventricular rate 157 bpm, PVCs, nonspecific ST abnormality likely rate related  Telemetry:  Telemetry was personally reviewed and demonstrates:   Atrial fibrillation rate 90-100  Relevant CV Studies: Echocardiogram  pending  Laboratory Data:  High Sensitivity Troponin:  No results for input(s): "  TROPONINIHS" in the last 720 hours.   Chemistry Recent Labs  Lab 12/18/22 2222 12/19/22 0602 12/20/22 0441  NA 132* 138 139  K 3.0* 3.4* 3.6  CL 100 104 106  CO2 21* 25 27  GLUCOSE 187* 103* 113*  BUN 12 10 7*  CREATININE 0.77 0.59 0.62  CALCIUM 8.7* 8.1* 8.1*  MG  --  1.6* 2.2  GFRNONAA >60 >60 >60  ANIONGAP 11 9 6     Recent Labs  Lab 12/18/22 2222  PROT 6.4*  ALBUMIN 3.7  AST 19  ALT 16  ALKPHOS 44  BILITOT 2.4*   Lipids No results for input(s): "CHOL", "TRIG", "HDL", "LABVLDL", "LDLCALC", "CHOLHDL" in the last 168 hours.  Hematology Recent Labs  Lab 12/18/22 2222 12/19/22 0602 12/20/22 0441  WBC 10.3 7.5 8.0  RBC 4.20 3.62* 3.56*  HGB 13.8 11.9* 11.7*  HCT 39.5 34.9* 34.3*  MCV 94.0 96.4 96.3  MCH 32.9 32.9 32.9  MCHC 34.9 34.1 34.1  RDW 13.1 13.2 13.2  PLT 173 120* 123*   Thyroid No results for input(s): "TSH", "FREET4" in the last 168 hours.  BNP Recent Labs  Lab 12/19/22 0602  BNP 192.3*    DDimer No results for input(s): "DDIMER" in the last 168 hours.   Radiology/Studies:  DG Chest Port 1 View  Result Date: 12/18/2022 CLINICAL DATA:  Sepsis EXAM: PORTABLE CHEST 1 VIEW COMPARISON:  12/12/2022 FINDINGS: Mild cardiomegaly. No focal opacity or pleural effusion. No pneumothorax IMPRESSION: No active disease. Mild cardiomegaly. Electronically Signed   By: Jasmine Pang M.D.   On: 12/18/2022 23:50     Assessment and Plan:   Atrial fibrillation with RVR Longstanding history of atrial fibrillation, medications for rate control previously held On review of notes she had near syncopal episode in 2019 and beta-blocker was held at that time -Will start metoprolol to tartrate 12.5 twice daily -If blood pressure runs low, may need to start low-dose digoxin for rate control -Not a candidate for rhythm control at this time -Respiratory status may be contributing to  elevated rate Getting over COVID then pneumonia, still with bronchitis symptoms, on broad-spectrum antibiotics -Continue Eliquis 5 twice daily -Echocardiogram ordered to reevaluate ejection fraction in the setting of longstanding atrial fibrillation  Hypokalemia -On HCTZ as an outpatient, takes potassium 20 mill equivalents daily -Will recommend she either increase potassium up to 30 mill equivalents daily or we could decrease dose of hydrochlorothiazide  Anxiety/panic Takes Xanax Managed by primary care  Hyperlipidemia On Lipitor 20 daily   For questions or updates, please contact [facility] Please consult [email or web address] for contact info under    Signed, Julien Nordmann, MD  12/20/2022 7:02 PM

## 2022-12-20 NOTE — Progress Notes (Signed)
PROGRESS NOTE    Bonnie Shaw  WGN:[id number] DOB: December 10, 1950 DOA: 12/18/2022 PCP: Dale [place], MD    Brief Narrative:  72 year old with history of OA, HTN, HLD, hypothyroidism, panic disorder comes to the ED with generalized weakness.  Diagnosis of COVID-19 in September later had pneumonia associated with dyspnea and wheezing.  During this time she has had fevers and chills at home.  Upon admission found to hypokalemia, possible pneumonia and was in atrial fibrillation with RVR.  Cardiology team consulted.   Assessment & Plan:  Principal Problem:   Sepsis due to undetermined organism [facility]) Active Problems:   Hypothyroidism   Hypokalemia   Chronic atrial fibrillation with RVR (HCC)   Anxiety and depression   Dyslipidemia    Sepsis due to undetermined organism (HCC) Concerns of pneumonia, procalcitonin negative.  Will discontinue Rocephin, transition to p.o. azithromycin and complete total 5 days.  ProCal 0.18. Bronchodilators, I-S/flutter valve  Chronic atrial fibrillation with intermittent RVR Continue Eliquis.  For now declining beta-blocker or other AV nodal blocker.  Agreeable to discuss with cardiology regarding further options.    Hypokalemia Replete as needed   Hypothyroidism Synthroid   Dyslipidemia Lipitor   Anxiety and depression Zoloft, as needed Xanax       DVT prophylaxis: apixaban (ELIQUIS) tablet 5 mg  Code Status: Full code Family Communication: Family at bedside Status is: Inpatient Remains inpatient appropriate because: Abnormal BS and heart rate    Subjective: Seen and examined at bedside.  At that time patient's telemetry showed her heart rate was in atrial fibrillation in the 150s.  Patient denied any complaints besides slight fatigue.  Examination:  General exam: Appears calm and comfortable  Respiratory system: Very mild basilar rhonchi Cardiovascular system: S1 & S2 heard, RRR. No JVD, murmurs, rubs, gallops or clicks. No pedal  edema. Gastrointestinal system: Abdomen is nondistended, soft and nontender. No organomegaly or masses felt. Normal bowel sounds heard. Central nervous system: Alert and oriented. No focal neurological deficits. Extremities: Symmetric 5 x 5 power. Skin: No rashes, lesions or ulcers Psychiatry: Judgement and insight appear normal. Mood & affect appropriate.                 Diet Orders (From admission, onward)     Start     Ordered   12/19/22 0314  Diet Heart Room service appropriate? Yes; Fluid consistency: Thin  Diet effective now       Question Answer Comment  Room service appropriate? Yes   Fluid consistency: Thin      12/19/22 0314            Objective: Vitals:   12/20/22 0000 12/20/22 0437 12/20/22 0916 12/20/22 1321  BP: 128/76 115/76 129/78 (!) 114/59  Pulse: 100 96 (!) 103 (!) 102  Resp: 20  (!) 22 20  Temp: 99.4 F (37.4 C) 97.8 F (36.6 C) (!) 97.5 F (36.4 C) 99 F (37.2 C)  TempSrc: Oral Oral    SpO2: 93% 96% 94% 95%  Weight:      Height:       No intake or output data in the 24 hours ending 12/20/22 1340 Filed Weights   12/18/22 2211  Weight: 122.5 kg    Scheduled Meds:  apixaban  5 mg Oral BID   atorvastatin  20 mg Oral Daily   azithromycin  500 mg Oral Daily   cholecalciferol  2,000 Units Oral Daily   levothyroxine  50 mcg Oral Q0600   sertraline  50 mg  Oral Daily   Continuous Infusions:  cefTRIAXone (ROCEPHIN)  IV Stopped (12/19/22 2341)    Nutritional status     Body mass index is 47.83 kg/m.  Data Reviewed:   CBC: Recent Labs  Lab 12/18/22 2222 12/19/22 0602 12/20/22 0441  WBC 10.3 7.5 8.0  NEUTROABS 7.4  --   --   HGB 13.8 11.9* 11.7*  HCT 39.5 34.9* 34.3*  MCV 94.0 96.4 96.3  PLT 173 120* 123*   Basic Metabolic Panel: Recent Labs  Lab 12/18/22 2222 12/19/22 0602 12/20/22 0441  NA 132* 138 139  K 3.0* 3.4* 3.6  CL 100 104 106  CO2 21* 25 27  GLUCOSE 187* 103* 113*  BUN 12 10 7*  CREATININE 0.77  0.59 0.62  CALCIUM 8.7* 8.1* 8.1*  MG  --  1.6* 2.2  PHOS  --  2.9 2.5   GFR: Estimated Creatinine Clearance: 79.5 mL/min (by C-G formula based on SCr of 0.62 mg/dL). Liver Function Tests: Recent Labs  Lab 12/18/22 2222  AST 19  ALT 16  ALKPHOS 44  BILITOT 2.4*  PROT 6.4*  ALBUMIN 3.7   No results for input(s): "LIPASE", "AMYLASE" in the last 168 hours. No results for input(s): "AMMONIA" in the last 168 hours. Coagulation Profile: Recent Labs  Lab 12/18/22 2222 12/19/22 0602  INR 1.3* 1.3*   Cardiac Enzymes: No results for input(s): "CKTOTAL", "CKMB", "CKMBINDEX", "TROPONINI" in the last 168 hours. BNP (last 3 results) No results for input(s): "PROBNP" in the last 8760 hours. HbA1C: No results for input(s): "HGBA1C" in the last 72 hours. CBG: No results for input(s): "GLUCAP" in the last 168 hours. Lipid Profile: No results for input(s): "CHOL", "HDL", "LDLCALC", "TRIG", "CHOLHDL", "LDLDIRECT" in the last 72 hours. Thyroid Function Tests: No results for input(s): "TSH", "T4TOTAL", "FREET4", "T3FREE", "THYROIDAB" in the last 72 hours. Anemia Panel: No results for input(s): "VITAMINB12", "FOLATE", "FERRITIN", "TIBC", "IRON", "RETICCTPCT" in the last 72 hours. Sepsis Labs: Recent Labs  Lab 12/18/22 2222 12/19/22 0136 12/19/22 0602  PROCALCITON  --   --  0.18  LATICACIDVEN 2.1* 1.7  --     Recent Results (from the past 240 hour(s))  Culture, blood (Routine x 2)     Status: None (Preliminary result)   Collection Time: 12/18/22 10:20 PM   Specimen: BLOOD  Result Value Ref Range Status   Specimen Description BLOOD BLOOD RIGHT FOREARM  Final   Special Requests   Final    BOTTLES DRAWN AEROBIC AND ANAEROBIC Blood Culture adequate volume   Culture   Final    NO GROWTH 2 DAYS Performed at [facility], [address] [place], [place] [address]    Report Status PENDING  Incomplete  Culture, blood (Routine x 2)     Status: None (Preliminary result)    Collection Time: 12/18/22 10:46 PM   Specimen: BLOOD  Result Value Ref Range Status   Specimen Description BLOOD BLOOD LEFT ARM  Final   Special Requests   Final    BOTTLES DRAWN AEROBIC AND ANAEROBIC Blood Culture results may not be optimal due to an excessive volume of blood received in culture bottles   Culture   Final    NO GROWTH 2 DAYS Performed at [facility], [address] [place], [place] [address]    Report Status PENDING  Incomplete  Resp panel by RT-PCR (RSV, Flu A&B, Covid) Urine, Catheterized     Status: None   Collection Time: 12/18/22 10:46 PM   Specimen:  Urine, Catheterized; Nasal Swab  Result Value Ref Range Status   SARS Coronavirus 2 by RT PCR NEGATIVE NEGATIVE Final    Comment: (NOTE) SARS-CoV-2 target nucleic acids are NOT DETECTED.  The SARS-CoV-2 RNA is generally detectable in upper respiratory specimens during the acute phase of infection. The lowest concentration of SARS-CoV-2 viral copies this assay can detect is 138 copies/mL. A negative result does not preclude SARS-Cov-2 infection and should not be used as the sole basis for treatment or other patient management decisions. A negative result may occur with  improper specimen collection/handling, submission of specimen other than nasopharyngeal swab, presence of viral mutation(s) within the areas targeted by this assay, and inadequate number of viral copies(<138 copies/mL). A negative result must be combined with clinical observations, patient history, and epidemiological information. The expected result is Negative.  Fact Sheet for Patients:  BloggerCourse.com  Fact Sheet for Healthcare Providers:  SeriousBroker.it  This test is no t yet approved or cleared by the [place] FDA and  has been authorized for detection and/or diagnosis of SARS-CoV-2 by FDA under an Emergency Use Authorization (EUA). This EUA will remain  in effect  (meaning this test can be used) for the duration of the COVID-19 declaration under Section 564(b)(1) of the Act, 21 U.S.C.section 360bbb-3(b)(1), unless the authorization is terminated  or revoked sooner.       Influenza A by PCR NEGATIVE NEGATIVE Final   Influenza B by PCR NEGATIVE NEGATIVE Final    Comment: (NOTE) The Xpert Xpress SARS-CoV-2/FLU/RSV plus assay is intended as an aid in the diagnosis of influenza from Nasopharyngeal swab specimens and should not be used as a sole basis for treatment. Nasal washings and aspirates are unacceptable for Xpert Xpress SARS-CoV-2/FLU/RSV testing.  Fact Sheet for Patients: BloggerCourse.com  Fact Sheet for Healthcare Providers: SeriousBroker.it  This test is not yet approved or cleared by the [place] FDA and has been authorized for detection and/or diagnosis of SARS-CoV-2 by FDA under an Emergency Use Authorization (EUA). This EUA will remain in effect (meaning this test can be used) for the duration of the COVID-19 declaration under Section 564(b)(1) of the Act, 21 U.S.C. section 360bbb-3(b)(1), unless the authorization is terminated or revoked.     Resp Syncytial Virus by PCR NEGATIVE NEGATIVE Final    Comment: (NOTE) Fact Sheet for Patients: BloggerCourse.com  Fact Sheet for Healthcare Providers: SeriousBroker.it  This test is not yet approved or cleared by the [place] FDA and has been authorized for detection and/or diagnosis of SARS-CoV-2 by FDA under an Emergency Use Authorization (EUA). This EUA will remain in effect (meaning this test can be used) for the duration of the COVID-19 declaration under Section 564(b)(1) of the Act, 21 U.S.C. section 360bbb-3(b)(1), unless the authorization is terminated or revoked.  Performed at [facility], [address] [place], [place] [address]           Radiology Studies: DG Chest [place] 1 View  Result Date: 12/18/2022 CLINICAL DATA:  Sepsis EXAM: PORTABLE CHEST 1 VIEW COMPARISON:  12/12/2022 FINDINGS: Mild cardiomegaly. No focal opacity or pleural effusion. No pneumothorax IMPRESSION: No active disease. Mild cardiomegaly. Electronically Signed   By: Jasmine Pang M.D.   On: 12/18/2022 23:50           LOS: 1 day   Time spent= 35 mins    Miguel Rota, MD [facility]  If 7PM-7AM, please contact night-coverage  12/20/2022, 1:40 PM

## 2022-12-20 NOTE — Consult Note (Signed)
PHARMACY CONSULT NOTE - ELECTROLYTES  Pharmacy Consult for Electrolyte Monitoring and Replacement   Recent Labs: Height: 5\' 3"  (160 cm) Weight: 122.5 kg (270 lb) IBW/kg (Calculated) : 52.4 Estimated Creatinine Clearance: 79.5 mL/min (by C-G formula based on SCr of 0.62 mg/dL). Potassium (mmol/L)  Date Value  12/20/2022 3.6  12/01/2013 3.5   Magnesium (mg/dL)  Date Value  60/11/9321 2.2   Calcium (mg/dL)  Date Value  55/73/2202 8.1 (L)   Calcium, Total (mg/dL)  Date Value  54/27/0623 8.7   Albumin (g/dL)  Date Value  76/28/3151 3.7   Phosphorus (mg/dL)  Date Value  76/16/0737 2.5   Sodium (mmol/L)  Date Value  12/20/2022 139  12/01/2013 142   Assessment  Bonnie Shaw is a 72 y.o. female presenting with sepsis. PMH significant for HTN, HLD, hypothyroidism. Pharmacy has been consulted to monitor and replace electrolytes.  Diet: Cardiac MIVF:  Pertinent medications: n/a  Goal of Therapy: Electrolytes WNL  Plan:  No electrolyte replacement currently warranted Check BMP, Mg,  with AM labs  Thank you for allowing pharmacy to be a part of this patient's care.  Bari Mantis PharmD Clinical Pharmacist 12/20/2022

## 2022-12-21 ENCOUNTER — Inpatient Hospital Stay (HOSPITAL_COMMUNITY)
Admit: 2022-12-21 | Discharge: 2022-12-21 | Disposition: A | Discharge status: Home / Self Care | Payer: Medicare Other | Attending: Cardiovascular Disease | Admitting: Cardiovascular Disease

## 2022-12-21 DIAGNOSIS — I4891 Unspecified atrial fibrillation: Secondary | ICD-10-CM

## 2022-12-21 DIAGNOSIS — A021 Salmonella sepsis: Secondary | ICD-10-CM

## 2022-12-21 LAB — BASIC METABOLIC PANEL
Anion gap: 7 (ref 5–15)
BUN: 7 mg/dL — ABNORMAL LOW (ref 8–23)
CO2: 26 mmol/L (ref 22–32)
Calcium: 8 mg/dL — ABNORMAL LOW (ref 8.9–10.3)
Chloride: 103 mmol/L (ref 98–111)
Creatinine, Ser: 0.57 mg/dL (ref 0.44–1.00)
GFR, Estimated: >60 mL/min (ref >60)
Glucose, Bld: 103 mg/dL — ABNORMAL HIGH (ref 70–99)
Potassium: 3.6 mmol/L (ref 3.5–5.1)
Sodium: 136 mmol/L (ref 135–145)

## 2022-12-21 LAB — ECHOCARDIOGRAM COMPLETE
AR max vel: 2.69 cm2
AV Peak grad: 5.6 mm[Hg]
Ao pk vel: 1.18 m/s
Area-P 1/2: 3.91 cm2
Height: 63 in
S' Lateral: 3.1 cm
Weight: 4320 [oz_av]

## 2022-12-21 LAB — CBC
HCT: 32.7 % — ABNORMAL LOW (ref 36.0–46.0)
Hemoglobin: 11.3 g/dL — ABNORMAL LOW (ref 12.0–15.0)
MCH: 32.3 pg (ref 26.0–34.0)
MCHC: 34.6 g/dL (ref 30.0–36.0)
MCV: 93.4 fL (ref 80.0–100.0)
Platelets: 140 10*3/uL — ABNORMAL LOW (ref 150–400)
RBC: 3.5 MIL/uL — ABNORMAL LOW (ref 3.87–5.11)
RDW: 13.4 % (ref 11.5–15.5)
WBC: 8.2 10*3/uL (ref 4.0–10.5)
nRBC: 0 % (ref 0.0–0.2)

## 2022-12-21 LAB — MAGNESIUM: Magnesium: 2 mg/dL (ref 1.7–2.4)

## 2022-12-21 MED ORDER — AZITHROMYCIN 250 MG PO TABS: 250.0000 mg | ORAL_TABLET | Freq: Every day | ORAL | 0 refills | Status: AC

## 2022-12-21 MED ORDER — POTASSIUM CHLORIDE ER 10 MEQ PO TBCR: 20.0000 meq | EXTENDED_RELEASE_TABLET | Freq: Every day | ORAL | 0 refills | Status: DC

## 2022-12-21 MED ORDER — ALBUTEROL SULFATE HFA 108 (90 BASE) MCG/ACT IN AERS: 2.0000 | INHALATION_SPRAY | Freq: Four times a day (QID) | RESPIRATORY_TRACT | 0 refills | Status: DC | PRN

## 2022-12-21 MED ORDER — METOPROLOL TARTRATE 25 MG PO TABS: 25.0000 mg | ORAL_TABLET | Freq: Two times a day (BID) | ORAL | 0 refills | Status: DC

## 2022-12-21 MED ORDER — METOPROLOL TARTRATE 25 MG PO TABS: 25.0000 mg | ORAL_TABLET | Freq: Two times a day (BID) | ORAL | Status: DC

## 2022-12-21 NOTE — Progress Notes (Signed)
Rounding Note    Patient Name: Bonnie Shaw Date of Encounter: 12/21/2022  [facility] Cardiologist: [facility]  Subjective   Reports feeling well, denies tachypalpitations, no shortness of breath Tolerated metoprolol to tartrate 12.5 twice daily Feels less anxious Attributes her symptoms to taking Xanax for her MRI  Inpatient Medications    Scheduled Meds:  apixaban  5 mg Oral BID   atorvastatin  20 mg Oral Daily   azithromycin  500 mg Oral Daily   cholecalciferol  2,000 Units Oral Daily   levothyroxine  50 mcg Oral Q0600   metoprolol tartrate  25 mg Oral BID   sertraline  50 mg Oral Daily   Continuous Infusions:  cefTRIAXone (ROCEPHIN)  IV Stopped (12/21/22 0015)   PRN Meds: acetaminophen **OR** acetaminophen, ALPRAZolam, chlorpheniramine-HYDROcodone, hydrALAZINE, ipratropium-albuterol, magnesium hydroxide, metoprolol tartrate, ondansetron **OR** ondansetron (ZOFRAN) IV, senna-docusate, traZODone   Vital Signs    Vitals:   12/21/22 0024 12/21/22 0554 12/21/22 0600 12/21/22 0909  BP: 123/76 121/78  (!) 127/94  Pulse: (!) 101 (!) 111 (!) 111 (!) 102  Resp: 18 18  16   Temp: 98.8 F (37.1 C) 99.1 F (37.3 C)  98.6 F (37 C)  TempSrc: Oral Oral    SpO2: 93% 94%  93%  Weight:      Height:        Intake/Output Summary (Last 24 hours) at 12/21/2022 1635 Last data filed at 12/21/2022 1141 Gross per 24 hour  Intake 220 ml  Output --  Net 220 ml      12/18/2022   10:11 PM 11/14/2022    1:15 PM 07/30/2022    7:35 AM  Last 3 Weights  Weight (lbs) 270 lb 232 lb 226 lb 9.6 oz  Weight (kg) 122.471 kg 105.235 kg 102.785 kg      Telemetry    Atrial fibrillation with rate 80-100- Personally Reviewed  ECG     - Personally Reviewed  Physical Exam   GEN: No acute distress.   Neck: No JVD Cardiac: Irregularly irregular no murmurs, rubs, or gallops.  Respiratory: Clear to auscultation bilaterally. GI: Soft, nontender, non-distended  MS: No edema;  No deformity. Neuro:  Nonfocal  Psych: Normal affect   Labs    High Sensitivity Troponin:  No results for input(s): "TROPONINIHS" in the last 720 hours.   Chemistry Recent Labs  Lab 12/18/22 2222 12/19/22 0602 12/20/22 0441 12/21/22 0355  NA 132* 138 139 136  K 3.0* 3.4* 3.6 3.6  CL 100 104 106 103  CO2 21* 25 27 26   GLUCOSE 187* 103* 113* 103*  BUN 12 10 7* 7*  CREATININE 0.77 0.59 0.62 0.57  CALCIUM 8.7* 8.1* 8.1* 8.0*  MG  --  1.6* 2.2 2.0  PROT 6.4*  --   --   --   ALBUMIN 3.7  --   --   --   AST 19  --   --   --   ALT 16  --   --   --   ALKPHOS 44  --   --   --   BILITOT 2.4*  --   --   --   GFRNONAA >60 >60 >60 >60  ANIONGAP 11 9 6 7     Lipids No results for input(s): "CHOL", "TRIG", "HDL", "LABVLDL", "LDLCALC", "CHOLHDL" in the last 168 hours.  Hematology Recent Labs  Lab 12/19/22 0602 12/20/22 0441 12/21/22 0355  WBC 7.5 8.0 8.2  RBC 3.62* 3.56* 3.50*  HGB 11.9* 11.7*  11.3*  HCT 34.9* 34.3* 32.7*  MCV 96.4 96.3 93.4  MCH 32.9 32.9 32.3  MCHC 34.1 34.1 34.6  RDW 13.2 13.2 13.4  PLT 120* 123* 140*   Thyroid No results for input(s): "TSH", "FREET4" in the last 168 hours.  BNP Recent Labs  Lab 12/19/22 0602  BNP 192.3*    DDimer No results for input(s): "DDIMER" in the last 168 hours.   Radiology    ECHOCARDIOGRAM COMPLETE  Result Date: 12/21/2022    ECHOCARDIOGRAM REPORT   Patient Name:   Bonnie Shaw Date of Exam: 12/21/2022 Medical Rec #:  [id number]      Height:       63.0 in Accession #:    [id number]     Weight:       270.0 lb Date of Birth:  [birth year]      BSA:          2.197 m Patient Age:    72 years       BP:           121/78 mmHg Patient Gender: F              HR:           98 bpm. Exam Location:  [facility] Procedure: 2D Echo Indications:     Atrial Fibrillation I48.91  History:         Patient has prior history of Echocardiogram examinations, most                  recent 09/21/2017.  Sonographer:     Overton Mam RDCS, FASE Referring  Phys:  [id number] Iba Diagnosing Phys: Julien Nordmann MD IMPRESSIONS  1. Left ventricular ejection fraction, by estimation, is 55 to 60%. The left ventricle has normal function. The left ventricle has no regional wall motion abnormalities. Left ventricular diastolic parameters are indeterminate.  2. Right ventricular systolic function is normal. The right ventricular size is normal. There is mildly elevated pulmonary artery systolic pressure. The estimated right ventricular systolic pressure is 39.1 mmHg.  3. Left atrial size was mildly dilated.  4. The mitral valve is normal in structure. Mild to moderate mitral valve regurgitation. No evidence of mitral stenosis.  5. The aortic valve is tricuspid. Aortic valve regurgitation is not visualized. No aortic stenosis is present.  6. The inferior vena cava is normal in size with greater than 50% respiratory variability, suggesting right atrial pressure of 3 mmHg. FINDINGS  Left Ventricle: Left ventricular ejection fraction, by estimation, is 55 to 60%. The left ventricle has normal function. The left ventricle has no regional wall motion abnormalities. The left ventricular internal cavity size was normal in size. There is  no left ventricular hypertrophy. Left ventricular diastolic parameters are indeterminate. Right Ventricle: The right ventricular size is normal. No increase in right ventricular wall thickness. Right ventricular systolic function is normal. There is mildly elevated pulmonary artery systolic pressure. The tricuspid regurgitant velocity is 2.92  m/s, and with an assumed right atrial pressure of 5 mmHg, the estimated right ventricular systolic pressure is 39.1 mmHg. Left Atrium: Left atrial size was mildly dilated. Right Atrium: Right atrial size was normal in size. Pericardium: There is no evidence of pericardial effusion. Mitral Valve: The mitral valve is normal in structure. Mild to moderate mitral valve regurgitation. No evidence of mitral  valve stenosis. Tricuspid Valve: The tricuspid valve is normal in structure. Tricuspid valve regurgitation is mild . No evidence of tricuspid stenosis. Aortic  Valve: The aortic valve is tricuspid. Aortic valve regurgitation is not visualized. No aortic stenosis is present. Aortic valve peak gradient measures 5.6 mmHg. Pulmonic Valve: The pulmonic valve was normal in structure. Pulmonic valve regurgitation is not visualized. No evidence of pulmonic stenosis. Aorta: The aortic root is normal in size and structure. Venous: The inferior vena cava is normal in size with greater than 50% respiratory variability, suggesting right atrial pressure of 3 mmHg. IAS/Shunts: No atrial level shunt detected by color flow Doppler.  LEFT VENTRICLE PLAX 2D LVIDd:         4.40 cm   Diastology LVIDs:         3.10 cm   LV e' medial:    10.20 cm/s LV PW:         1.10 cm   LV E/e' medial:  11.7 LV IVS:        1.10 cm   LV e' lateral:   11.60 cm/s LVOT diam:     1.90 cm   LV E/e' lateral: 10.3 LV SV:         53 LV SV Index:   24 LVOT Area:     2.84 cm  RIGHT VENTRICLE RV Basal diam:  3.30 cm RV S prime:     12.80 cm/s TAPSE (M-mode): 1.6 cm LEFT ATRIUM             Index        RIGHT ATRIUM           Index LA diam:        5.10 cm 2.32 cm/m   RA Area:     32.70 cm LA Vol (A2C):   73.7 ml 33.55 ml/m  RA Volume:   123.00 ml 55.99 ml/m LA Vol (A4C):   76.1 ml 34.64 ml/m LA Biplane Vol: 78.0 ml 35.51 ml/m  AORTIC VALVE                 PULMONIC VALVE AV Area (Vmax): 2.69 cm     PV Vmax:        0.80 m/s AV Vmax:        118.00 cm/s  PV Peak grad:   2.6 mmHg AV Peak Grad:   5.6 mmHg     RVOT Peak grad: 2 mmHg LVOT Vmax:      112.00 cm/s LVOT Vmean:     72.600 cm/s LVOT VTI:       0.188 m  AORTA Ao Root diam: 3.50 cm Ao Asc diam:  3.10 cm MITRAL VALVE                TRICUSPID VALVE MV Area (PHT): 3.91 cm     TR Peak grad:   34.1 mmHg MV Decel Time: 194 msec     TR Vmax:        292.00 cm/s MV E velocity: 119.00 cm/s                              SHUNTS                             Systemic VTI:  0.19 m                             Systemic Diam: 1.90 cm Julien Nordmann MD Electronically signed by Julien Nordmann MD Signature Date/Time:  12/21/2022/2:40:09 PM    Final     Cardiac Studies     Patient Profile     Bonnie Shaw is a 72 y.o. female with a hx of permanent atrial fibrillation, obesity, anxiety/panic disorder presenting to the ER for persistent weakness, shortness of breath   Assessment & Plan    Atrial fibrillation with RVR Longstanding history of atrial fibrillation, past several years, medications for rate control/beta-blocker previously held after episode of near syncope 2019 Given poorly controlled rate most notably with exertion, would continue metoprolol tartrate up to 25 twice daily -Respiratory status /bronchitis may be contributing to elevated rate Getting over COVID then pneumonia, still with bronchitis symptoms, on broad-spectrum antibiotics -Continue Eliquis 5 twice daily -Echocardiogram with normal ejection fraction  Hypokalemia -On HCTZ as an outpatient, likely contributing to hypokalemia Will recommend we stop HCTZ and potassium supplement, follow-up BMP -If HCTZ needed for leg swelling, would increase potassium up to 30 mill equivalents daily   Anxiety/panic -Reports having side effects from Xanax Managed by primary care   Hyperlipidemia On Lipitor 20 daily      For questions or updates, please contact [facility] Please consult [email or web address] for contact info under        Signed, Julien Nordmann, MD  12/21/2022, 4:35 PM

## 2022-12-21 NOTE — Progress Notes (Signed)
  Echocardiogram 2D Echocardiogram has been performed.  Bonnie Shaw 12/21/2022, 9:00 AM

## 2022-12-21 NOTE — Discharge Summary (Signed)
Physician Discharge Summary  SARATH TALAVERA RUE:[id number] DOB: August 17, 1950 DOA: 12/18/2022  PCP: Dale Butters, MD  Admit date: 12/18/2022 Discharge date: 12/21/2022  Admitted From: Home Disposition: Home  Recommendations for Outpatient Follow-up:  Follow up with PCP in 1-2 weeks Please obtain BMP/CBC in one week your next doctors visit.  P.o. azithromycin to complete 5-day course Metoprolol 25 mg twice daily.  Continue Eliquis Continue hydrochlorothiazide.  Potassium supplements increased.  Repeat in 3- 5 days with PCP   Discharge Condition: Stable CODE STATUS: Full code Diet recommendation: Heart healthy   Brief Narrative:  72 year old with history of OA, HTN, HLD, hypothyroidism, panic disorder comes to the ED with generalized weakness.  Diagnosis of COVID-19 in September later had pneumonia associated with dyspnea and wheezing.  During this time she has had fevers and chills at home.  Upon admission found to hypokalemia, possible pneumonia and was in atrial fibrillation with RVR.  Cardiology team consulted.  Echo was overall unremarkable, cardiology uptitrated beta-blocker.  Today medically stable for discharge.   Assessment & Plan:  Principal Problem:   Sepsis due to undetermined organism [facility]) Active Problems:   Hypothyroidism   Hypokalemia   Chronic atrial fibrillation with RVR (HCC)   Anxiety and depression   Dyslipidemia    Sepsis due to undetermined organism (HCC), significantly improved Concerns of pneumonia, procalcitonin negative.  Will discontinue Rocephin, transition to p.o. azithromycin and complete total 5 days.  ProCal 0.18.  Rescue inhaler prescribed  Chronic atrial fibrillation with intermittent RVR Continue Eliquis.  Echo overall unremarkable per cardiology Seen by cardiology, beta-blocker uptitrated 25 mg twice daily  Essential HTN Hydrochlorothiazide at home.  Continue potassium supplements.  Repeat blood work outpatient in 3-5 days     Hypokalemia Replete as needed   Hypothyroidism Synthroid   Dyslipidemia Lipitor   Anxiety and depression Zoloft, as needed Xanax     DVT prophylaxis: apixaban (ELIQUIS) tablet 5 mg  Code Status: Full code Family Communication: Family at bedside Status is: Inpatient Remains inpatient appropriate because discharged today    Subjective: Doing better, no complaints.  Heart rate is better controlled  Examination:  General exam: Appears calm and comfortable  Respiratory system: Minimal expiratory wheezing Cardiovascular system: S1 & S2 heard, RRR. No JVD, murmurs, rubs, gallops or clicks. No pedal edema. Gastrointestinal system: Abdomen is nondistended, soft and nontender. No organomegaly or masses felt. Normal bowel sounds heard. Central nervous system: Alert and oriented. No focal neurological deficits. Extremities: Symmetric 5 x 5 power. Skin: No rashes, lesions or ulcers Psychiatry: Judgement and insight appear normal. Mood & affect appropriate.     Discharge Diagnoses:  Principal Problem:   Sepsis (HCC) Active Problems:   Hypothyroidism   Hypokalemia   Generalized weakness   Atrial fibrillation with rapid ventricular response (HCC)   Community acquired pneumonia   Chronic atrial fibrillation with RVR (HCC)   Anxiety and depression   Dyslipidemia   Hypoxia      Discharge Exam: Vitals:   12/21/22 0600 12/21/22 0909  BP:  (!) 127/94  Pulse: (!) 111 (!) 102  Resp:  16  Temp:  98.6 F (37 C)  SpO2:  93%   Vitals:   12/21/22 0024 12/21/22 0554 12/21/22 0600 12/21/22 0909  BP: 123/76 121/78  (!) 127/94  Pulse: (!) 101 (!) 111 (!) 111 (!) 102  Resp: 18 18  16   Temp: 98.8 F (37.1 C) 99.1 F (37.3 C)  98.6 F (37 C)  TempSrc: Oral Oral  SpO2: 93% 94%  93%  Weight:      Height:        Discharge Instructions   Allergies as of 12/21/2022       Reactions   Ativan [lorazepam] Other (See Comments)   High sensitivity, cause pt to pass out    Valium [diazepam] Other (See Comments)   Hallucinations, tremors, slurred speech   Meloxicam Other (See Comments)   Hallucinations   Trimethoprim Other (See Comments)   Sulfamethoxazole Rash   Sulfonamide Derivatives Rash        Medication List     TAKE these medications    albuterol 108 (90 Base) MCG/ACT inhaler Commonly known as: VENTOLIN HFA Inhale 2 puffs into the lungs every 6 (six) hours as needed for wheezing or shortness of breath.   ALPRAZolam 1 MG tablet Commonly known as: XANAX Take 1 mg by mouth at bedtime as needed.   atorvastatin 20 MG tablet Commonly known as: LIPITOR TAKE 1 TABLET BY MOUTH ONCE  DAILY   azithromycin 250 MG tablet Commonly known as: ZITHROMAX Take 1 tablet (250 mg total) by mouth daily for 3 days.   cholecalciferol 25 MCG (1000 UNIT) tablet Commonly known as: VITAMIN D3 Take 1,000 Units by mouth daily. Taking 2,000mg  daily   Eliquis 5 MG Tabs tablet Generic drug: apixaban TAKE 1 TABLET BY MOUTH TWICE  DAILY   hydrochlorothiazide 25 MG tablet Commonly known as: HYDRODIURIL TAKE 1 TABLET BY MOUTH DAILY   levothyroxine 50 MCG tablet Commonly known as: SYNTHROID Take 1 tablet (50 mcg total) by mouth daily.   metoprolol tartrate 25 MG tablet Commonly known as: LOPRESSOR Take 1 tablet (25 mg total) by mouth 2 (two) times daily.   nystatin 100000 UNIT/ML suspension Commonly known as: MYCOSTATIN Take 5 mLs (500,000 Units total) by mouth in the morning, at noon, and at bedtime. Swish and spit tid.   potassium chloride 10 MEQ tablet Commonly known as: KLOR-CON Take 2 tablets (20 mEq total) by mouth daily. TAKE 1 TABLET BY MOUTH  TWICE DAILY What changed:  how much to take when to take this additional instructions   sertraline 50 MG tablet Commonly known as: ZOLOFT TAKE 1 TABLET BY MOUTH DAILY        Allergies  Allergen Reactions   Ativan [Lorazepam] Other (See Comments)    High sensitivity, cause pt to pass out   Valium  [Diazepam] Other (See Comments)    Hallucinations, tremors, slurred speech   Meloxicam Other (See Comments)    Hallucinations   Trimethoprim Other (See Comments)   Sulfamethoxazole Rash   Sulfonamide Derivatives Rash    You were cared for by a hospitalist during your hospital stay. If you have any questions about your discharge medications or the care you received while you were in the hospital after you are discharged, you can call the unit and asked to speak with the hospitalist on call if the hospitalist that took care of you is not available. Once you are discharged, your primary care physician will handle any further medical issues. Please note that no refills for any discharge medications will be authorized once you are discharged, as it is imperative that you return to your primary care physician (or establish a relationship with a primary care physician if you do not have one) for your aftercare needs so that they can reassess your need for medications and monitor your lab values.  You were cared for by a hospitalist during your hospital stay. If  you have any questions about your discharge medications or the care you received while you were in the hospital after you are discharged, you can call the unit and asked to speak with the hospitalist on call if the hospitalist that took care of you is not available. Once you are discharged, your primary care physician will handle any further medical issues. Please note that NO REFILLS for any discharge medications will be authorized once you are discharged, as it is imperative that you return to your primary care physician (or establish a relationship with a primary care physician if you do not have one) for your aftercare needs so that they can reassess your need for medications and monitor your lab values.  Please request your Prim.MD to go over all Hospital Tests and Procedure/Radiological results at the follow up, please get all Hospital records  sent to your Prim MD by signing hospital release before you go home.  Get CBC, CMP, 2 view Chest X ray checked  by Primary MD during your next visit or SNF MD in 5-7 days ( we routinely change or add medications that can affect your baseline labs and fluid status, therefore we recommend that you get the mentioned basic workup next visit with your PCP, your PCP may decide not to get them or add new tests based on their clinical decision)  On your next visit with your primary care physician please Get Medicines reviewed and adjusted.  If you experience worsening of your admission symptoms, develop shortness of breath, life threatening emergency, suicidal or homicidal thoughts you must seek medical attention immediately by calling 911 or calling your MD immediately  if symptoms less severe.  You Must read complete instructions/literature along with all the possible adverse reactions/side effects for all the Medicines you take and that have been prescribed to you. Take any new Medicines after you have completely understood and accpet all the possible adverse reactions/side effects.   Do not drive, operate heavy machinery, perform activities at heights, swimming or participation in water activities or provide baby sitting services if your were admitted for syncope or siezures until you have seen by Primary MD or a Neurologist and advised to do so again.  Do not drive when taking Pain medications.   Procedures/Studies: DG Chest Port 1 View  Result Date: 12/18/2022 CLINICAL DATA:  Sepsis EXAM: PORTABLE CHEST 1 VIEW COMPARISON:  12/12/2022 FINDINGS: Mild cardiomegaly. No focal opacity or pleural effusion. No pneumothorax IMPRESSION: No active disease. Mild cardiomegaly. Electronically Signed   By: Jasmine Pang M.D.   On: 12/18/2022 23:50   DG Chest 2 View  Addendum Date: 12/16/2022   ADDENDUM REPORT: 12/16/2022 19:00 ADDENDUM: Cardiac silhouette is unchanged compared to the prior study.  Cardiothoracic ratio is 17/31. Electronically Signed   By: Layla Maw M.D.   On: 12/16/2022 19:00   Result Date: 12/16/2022 CLINICAL DATA:  Follow up pneumonia EXAM: CHEST - 2 VIEW COMPARISON:  10/21/2022 FINDINGS: Cardiac silhouette enlarged. No evidence of pneumothorax or pleural effusion. No evidence of pulmonary edema. Aorta is calcified. There are thoracic degenerative changes. IMPRESSION: Enlarged cardiac silhouette.  Lungs are clear. Electronically Signed: By: Layla Maw M.D. On: 12/13/2022 22:00     The results of significant diagnostics from this hospitalization (including imaging, microbiology, ancillary and laboratory) are listed below for reference.     Microbiology: Recent Results (from the past 240 hour(s))  Culture, blood (Routine x 2)     Status: None (Preliminary result)   Collection Time:  12/18/22 10:20 PM   Specimen: BLOOD  Result Value Ref Range Status   Specimen Description BLOOD BLOOD RIGHT FOREARM  Final   Special Requests   Final    BOTTLES DRAWN AEROBIC AND ANAEROBIC Blood Culture adequate volume   Culture   Final    NO GROWTH 3 DAYS Performed at [facility], [address], [place], [place] [address]    Report Status PENDING  Incomplete  Culture, blood (Routine x 2)     Status: None (Preliminary result)   Collection Time: 12/18/22 10:46 PM   Specimen: BLOOD  Result Value Ref Range Status   Specimen Description BLOOD BLOOD LEFT ARM  Final   Special Requests   Final    BOTTLES DRAWN AEROBIC AND ANAEROBIC Blood Culture results may not be optimal due to an excessive volume of blood received in culture bottles   Culture   Final    NO GROWTH 3 DAYS Performed at [facility], [address] [place], [place] [address]    Report Status PENDING  Incomplete  Resp panel by RT-PCR (RSV, Flu A&B, Covid) Urine, Catheterized     Status: None   Collection Time: 12/18/22 10:46 PM   Specimen: Urine, Catheterized; Nasal Swab  Result Value  Ref Range Status   SARS Coronavirus 2 by RT PCR NEGATIVE NEGATIVE Final    Comment: (NOTE) SARS-CoV-2 target nucleic acids are NOT DETECTED.  The SARS-CoV-2 RNA is generally detectable in upper respiratory specimens during the acute phase of infection. The lowest concentration of SARS-CoV-2 viral copies this assay can detect is 138 copies/mL. A negative result does not preclude SARS-Cov-2 infection and should not be used as the sole basis for treatment or other patient management decisions. A negative result may occur with  improper specimen collection/handling, submission of specimen other than nasopharyngeal swab, presence of viral mutation(s) within the areas targeted by this assay, and inadequate number of viral copies(<138 copies/mL). A negative result must be combined with clinical observations, patient history, and epidemiological information. The expected result is Negative.  Fact Sheet for Patients:  BloggerCourse.com  Fact Sheet for Healthcare Providers:  SeriousBroker.it  This test is no t yet approved or cleared by the [place] FDA and  has been authorized for detection and/or diagnosis of SARS-CoV-2 by FDA under an Emergency Use Authorization (EUA). This EUA will remain  in effect (meaning this test can be used) for the duration of the COVID-19 declaration under Section 564(b)(1) of the Act, 21 U.S.C.section 360bbb-3(b)(1), unless the authorization is terminated  or revoked sooner.       Influenza A by PCR NEGATIVE NEGATIVE Final   Influenza B by PCR NEGATIVE NEGATIVE Final    Comment: (NOTE) The Xpert Xpress SARS-CoV-2/FLU/RSV plus assay is intended as an aid in the diagnosis of influenza from Nasopharyngeal swab specimens and should not be used as a sole basis for treatment. Nasal washings and aspirates are unacceptable for Xpert Xpress SARS-CoV-2/FLU/RSV testing.  Fact Sheet for  Patients: BloggerCourse.com  Fact Sheet for Healthcare Providers: SeriousBroker.it  This test is not yet approved or cleared by the [place] FDA and has been authorized for detection and/or diagnosis of SARS-CoV-2 by FDA under an Emergency Use Authorization (EUA). This EUA will remain in effect (meaning this test can be used) for the duration of the COVID-19 declaration under Section 564(b)(1) of the Act, 21 U.S.C. section 360bbb-3(b)(1), unless the authorization is terminated or revoked.     Resp Syncytial Virus by PCR NEGATIVE  NEGATIVE Final    Comment: (NOTE) Fact Sheet for Patients: BloggerCourse.com  Fact Sheet for Healthcare Providers: SeriousBroker.it  This test is not yet approved or cleared by the [place] FDA and has been authorized for detection and/or diagnosis of SARS-CoV-2 by FDA under an Emergency Use Authorization (EUA). This EUA will remain in effect (meaning this test can be used) for the duration of the COVID-19 declaration under Section 564(b)(1) of the Act, 21 U.S.C. section 360bbb-3(b)(1), unless the authorization is terminated or revoked.  Performed at [facility], [address] [place], [place] [address]      Labs: BNP (last 3 results) Recent Labs    12/19/22 0602  BNP 192.3*   Basic Metabolic Panel: Recent Labs  Lab 12/18/22 2222 12/19/22 0602 12/20/22 0441 12/21/22 0355  NA 132* 138 139 136  K 3.0* 3.4* 3.6 3.6  CL 100 104 106 103  CO2 21* 25 27 26   GLUCOSE 187* 103* 113* 103*  BUN 12 10 7* 7*  CREATININE 0.77 0.59 0.62 0.57  CALCIUM 8.7* 8.1* 8.1* 8.0*  MG  --  1.6* 2.2 2.0  PHOS  --  2.9 2.5  --    Liver Function Tests: Recent Labs  Lab 12/18/22 2222  AST 19  ALT 16  ALKPHOS 44  BILITOT 2.4*  PROT 6.4*  ALBUMIN 3.7   No results for input(s): "LIPASE", "AMYLASE" in the last 168 hours. No results for  input(s): "AMMONIA" in the last 168 hours. CBC: Recent Labs  Lab 12/18/22 2222 12/19/22 0602 12/20/22 0441 12/21/22 0355  WBC 10.3 7.5 8.0 8.2  NEUTROABS 7.4  --   --   --   HGB 13.8 11.9* 11.7* 11.3*  HCT 39.5 34.9* 34.3* 32.7*  MCV 94.0 96.4 96.3 93.4  PLT 173 120* 123* 140*   Cardiac Enzymes: No results for input(s): "CKTOTAL", "CKMB", "CKMBINDEX", "TROPONINI" in the last 168 hours. BNP: Invalid input(s): "POCBNP" CBG: No results for input(s): "GLUCAP" in the last 168 hours. D-Dimer No results for input(s): "DDIMER" in the last 72 hours. Hgb A1c No results for input(s): "HGBA1C" in the last 72 hours. Lipid Profile No results for input(s): "CHOL", "HDL", "LDLCALC", "TRIG", "CHOLHDL", "LDLDIRECT" in the last 72 hours. Thyroid function studies No results for input(s): "TSH", "T4TOTAL", "T3FREE", "THYROIDAB" in the last 72 hours.  Invalid input(s): "FREET3" Anemia work up No results for input(s): "VITAMINB12", "FOLATE", "FERRITIN", "TIBC", "IRON", "RETICCTPCT" in the last 72 hours. Urinalysis    Component Value Date/Time   COLORURINE AMBER (A) 12/18/2022 2256   APPEARANCEUR HAZY (A) 12/18/2022 2256   APPEARANCEUR Hazy (A) 12/31/2021 0830   LABSPEC 1.024 12/18/2022 2256   PHURINE 6.0 12/18/2022 2256   GLUCOSEU NEGATIVE 12/18/2022 2256   GLUCOSEU NEGATIVE 03/13/2022 1402   HGBUR NEGATIVE 12/18/2022 2256   BILIRUBINUR NEGATIVE 12/18/2022 2256   BILIRUBINUR Negative 12/31/2021 0830   KETONESUR NEGATIVE 12/18/2022 2256   PROTEINUR 30 (A) 12/18/2022 2256   UROBILINOGEN 0.2 03/13/2022 1402   NITRITE NEGATIVE 12/18/2022 2256   LEUKOCYTESUR NEGATIVE 12/18/2022 2256   Sepsis Labs Recent Labs  Lab 12/18/22 2222 12/19/22 0602 12/20/22 0441 12/21/22 0355  WBC 10.3 7.5 8.0 8.2   Microbiology Recent Results (from the past 240 hour(s))  Culture, blood (Routine x 2)     Status: None (Preliminary result)   Collection Time: 12/18/22 10:20 PM   Specimen: BLOOD  Result  Value Ref Range Status   Specimen Description BLOOD BLOOD RIGHT FOREARM  Final   Special Requests  Final    BOTTLES DRAWN AEROBIC AND ANAEROBIC Blood Culture adequate volume   Culture   Final    NO GROWTH 3 DAYS Performed at [facility], [address] [place], [place] [address]    Report Status PENDING  Incomplete  Culture, blood (Routine x 2)     Status: None (Preliminary result)   Collection Time: 12/18/22 10:46 PM   Specimen: BLOOD  Result Value Ref Range Status   Specimen Description BLOOD BLOOD LEFT ARM  Final   Special Requests   Final    BOTTLES DRAWN AEROBIC AND ANAEROBIC Blood Culture results may not be optimal due to an excessive volume of blood received in culture bottles   Culture   Final    NO GROWTH 3 DAYS Performed at [facility], [address] [place], [place] [address]    Report Status PENDING  Incomplete  Resp panel by RT-PCR (RSV, Flu A&B, Covid) Urine, Catheterized     Status: None   Collection Time: 12/18/22 10:46 PM   Specimen: Urine, Catheterized; Nasal Swab  Result Value Ref Range Status   SARS Coronavirus 2 by RT PCR NEGATIVE NEGATIVE Final    Comment: (NOTE) SARS-CoV-2 target nucleic acids are NOT DETECTED.  The SARS-CoV-2 RNA is generally detectable in upper respiratory specimens during the acute phase of infection. The lowest concentration of SARS-CoV-2 viral copies this assay can detect is 138 copies/mL. A negative result does not preclude SARS-Cov-2 infection and should not be used as the sole basis for treatment or other patient management decisions. A negative result may occur with  improper specimen collection/handling, submission of specimen other than nasopharyngeal swab, presence of viral mutation(s) within the areas targeted by this assay, and inadequate number of viral copies(<138 copies/mL). A negative result must be combined with clinical observations, patient history, and epidemiological information. The  expected result is Negative.  Fact Sheet for Patients:  BloggerCourse.com  Fact Sheet for Healthcare Providers:  SeriousBroker.it  This test is no t yet approved or cleared by the [place] FDA and  has been authorized for detection and/or diagnosis of SARS-CoV-2 by FDA under an Emergency Use Authorization (EUA). This EUA will remain  in effect (meaning this test can be used) for the duration of the COVID-19 declaration under Section 564(b)(1) of the Act, 21 U.S.C.section 360bbb-3(b)(1), unless the authorization is terminated  or revoked sooner.       Influenza A by PCR NEGATIVE NEGATIVE Final   Influenza B by PCR NEGATIVE NEGATIVE Final    Comment: (NOTE) The Xpert Xpress SARS-CoV-2/FLU/RSV plus assay is intended as an aid in the diagnosis of influenza from Nasopharyngeal swab specimens and should not be used as a sole basis for treatment. Nasal washings and aspirates are unacceptable for Xpert Xpress SARS-CoV-2/FLU/RSV testing.  Fact Sheet for Patients: BloggerCourse.com  Fact Sheet for Healthcare Providers: SeriousBroker.it  This test is not yet approved or cleared by the [place] FDA and has been authorized for detection and/or diagnosis of SARS-CoV-2 by FDA under an Emergency Use Authorization (EUA). This EUA will remain in effect (meaning this test can be used) for the duration of the COVID-19 declaration under Section 564(b)(1) of the Act, 21 U.S.C. section 360bbb-3(b)(1), unless the authorization is terminated or revoked.     Resp Syncytial Virus by PCR NEGATIVE NEGATIVE Final    Comment: (NOTE) Fact Sheet for Patients: BloggerCourse.com  Fact Sheet for Healthcare Providers: SeriousBroker.it  This test is not yet approved or cleared by the  [place] [employer] and has been authorized for detection and/or  diagnosis of SARS-CoV-2 by FDA under an TEFL teacher (EUA). This EUA will remain in effect (meaning this test can be used) for the duration of the COVID-19 declaration under Section 564(b)(1) of the Act, 21 U.S.C. section 360bbb-3(b)(1), unless the authorization is terminated or revoked.  Performed at [facility], [address] [place], [place] [address]      Time coordinating discharge:  I have spent 35 minutes face to face with the patient and on the ward discussing the patients care, assessment, plan and disposition with other care givers. >50% of the time was devoted counseling the patient about the risks and benefits of treatment/Discharge disposition and coordinating care.   SIGNED:   Miguel Rota, MD  [facility] 12/21/2022, 12:41 PM   If 7PM-7AM, please contact night-coverage

## 2022-12-21 NOTE — Plan of Care (Signed)
Care plan complete

## 2022-12-22 ENCOUNTER — Telehealth: Payer: Self-pay

## 2022-12-22 NOTE — Transitions of Care (Post Inpatient/ED Visit) (Signed)
   12/22/2022  Name: Bonnie Shaw MRN: [id number] DOB: 1951-01-29  Today's TOC FU Call Status: Today's TOC FU Call Status:: Unsuccessful Call (1st Attempt) Unsuccessful Call (1st Attempt) Date: 12/22/22  Attempted to reach the patient regarding the most recent Inpatient/ED visit.  Follow Up Plan: Additional outreach attempts will be made to reach the patient to complete the Transitions of Care (Post Inpatient/ED visit) call.  Lanette Hamilton called 11/18  Deidre Ala, RN RN Care Manager [facility] [phone]

## 2022-12-22 NOTE — Transitions of Care (Post Inpatient/ED Visit) (Signed)
12/22/2022  Name: Bonnie Shaw MRN: [id number] DOB: [birth year]  Today's TOC FU Call Status: Today's TOC FU Call Status:: Successful TOC FU Call Completed TOC FU Call Complete Date: 12/22/22 Patient's Name and Date of Birth confirmed.  Transition Care Management Follow-up Telephone Call Date of Discharge: 12/21/22 Discharge Facility: [facility]) Type of Discharge: Emergency Department Reason for ED Visit: Other: (weakness) How have you been since you were released from the hospital?: Same Any questions or concerns?: No  Items Reviewed: Did you receive and understand the discharge instructions provided?: No Medications obtained,verified, and reconciled?: Yes (Medications Reviewed) Any new allergies since your discharge?: No Dietary orders reviewed?: Yes Do you have support at home?: Yes People in Home: spouse  Medications Reviewed Today: Medications Reviewed Today     Reviewed by Karena Addison, LPN (Licensed Practical Nurse) on 12/22/22 at 0915  Med List Status: <None>   Medication Order Taking? Sig Documenting Provider Last Dose Status Informant  albuterol (VENTOLIN HFA) 108 (90 Base) MCG/ACT inhaler [id number]  Inhale 2 puffs into the lungs every 6 (six) hours as needed for wheezing or shortness of breath. Miguel Rota, MD  Active   ALPRAZolam Prudy Feeler) 1 MG tablet [id number] No Take 1 mg by mouth at bedtime as needed. [provider] 12/17/2022 Active Self           Med Note Katrinka Blazing, Alona Bene   Fri Dec 19, 2022  2:22 AM) Contraindication alert  atorvastatin (LIPITOR) 20 MG tablet [id number] No TAKE 1 TABLET BY MOUTH ONCE  DAILY  Patient not taking: Reported on 12/19/2022   Dale [place], MD Not Taking Active Self  azithromycin (ZITHROMAX) 250 MG tablet [id number]  Take 1 tablet (250 mg total) by mouth daily for 3 days. Amin, Ankit C, MD  Active   cholecalciferol (VITAMIN D3) 25 MCG (1000 UT) tablet [id number] No Take 1,000 Units by mouth  daily. Taking 2,000mg  daily [provider] 12/18/2022 Active Self  ELIQUIS 5 MG TABS tablet [id number]  TAKE 1 TABLET BY MOUTH TWICE  DAILY Dale [place], MD  Active Self  hydrochlorothiazide (HYDRODIURIL) 25 MG tablet [id number] No TAKE 1 TABLET BY MOUTH DAILY Dale [place], MD unk Active Self  levothyroxine (SYNTHROID) 50 MCG tablet [id number] No Take 1 tablet (50 mcg total) by mouth daily. Dale [place], MD unk Active Self  metoprolol tartrate (LOPRESSOR) 25 MG tablet [id number]  Take 1 tablet (25 mg total) by mouth 2 (two) times daily. Miguel Rota, MD  Active   nystatin (MYCOSTATIN) 100000 UNIT/ML suspension [id number] No Take 5 mLs (500,000 Units total) by mouth in the morning, at noon, and at bedtime. Swish and spit tid. Dale Moffat, MD unk Active Self  potassium chloride (KLOR-CON) 10 MEQ tablet [id number]  Take 2 tablets (20 mEq total) by mouth daily. TAKE 1 TABLET BY MOUTH  TWICE DAILY Amin, Ankit C, MD  Active   sertraline (ZOLOFT) 50 MG tablet [id number] No TAKE 1 TABLET BY MOUTH DAILY Dale [place], MD 12/17/2022 Active Self            Home Care and Equipment/Supplies: Were Home Health Services Ordered?: NA Any new equipment or medical supplies ordered?: NA  Functional Questionnaire: Do you need assistance with bathing/showering or dressing?: No Do you need assistance with meal preparation?: No Do you need assistance with eating?: No Do you have difficulty maintaining continence: No Do you need assistance with getting out of bed/getting out of a chair/moving?: No Do you have difficulty managing  or taking your medications?: No  Follow up appointments reviewed: PCP Follow-up appointment confirmed?: Yes Date of PCP follow-up appointment?: 12/30/22 Follow-up Provider: [facility] Follow-up appointment confirmed?: NA Do you need transportation to your follow-up appointment?: No Do you understand care options if your condition(s) worsen?:  Yes-patient verbalized understanding    SIGNATURE Karena Addison, LPN [facility] Nurse Health Advisor Direct Dial [phone]

## 2022-12-23 ENCOUNTER — Telehealth: Payer: Self-pay | Admitting: *Deleted

## 2022-12-23 LAB — CULTURE, BLOOD (ROUTINE X 2)
Culture: NO GROWTH
Culture: NO GROWTH
Special Requests: ADEQUATE

## 2022-12-23 NOTE — Transitions of Care (Post Inpatient/ED Visit) (Signed)
12/23/2022  Name: Bonnie Shaw MRN: [id number] DOB: 10-05-1950  Today's TOC FU Call Status: Today's TOC FU Call Status:: Successful TOC FU Call Completed TOC FU Call Complete Date: 12/23/22 Patient's Name and Date of Birth confirmed.  Transition Care Management Follow-up Telephone Call Date of Discharge: 12/21/22 Discharge Facility: [facility]) Type of Discharge: Inpatient Admission Primary Inpatient Discharge Diagnosis:: Sepsis How have you been since you were released from the hospital?: Same (not sure. I still have the terrible cough, low energy but I am not having any pain) Any questions or concerns?: Yes Patient Questions/Concerns:: Patient stated she was not taking the albuterol inhaler because she heard it makes you nervous and anxious. She stated she was already that so she would talk with her Dr first Patient Questions/Concerns Addressed: Other: (Patient will talk with Dr on her 12/30/2022 visit)  Items Reviewed: Did you receive and understand the discharge instructions provided?: Yes Medications obtained,verified, and reconciled?: Yes (Medications Reviewed) Any new allergies since your discharge?: No Dietary orders reviewed?: Yes Type of Diet Ordered:: vegeterian Do you have support at home?: Yes People in Home: spouse Name of Support/Comfort Primary Source: [name]  Medications Reviewed Today: Medications Reviewed Today     Reviewed by Luella Cook, RN (Case Manager) on 12/23/22 at 1653  Med List Status: <None>   Medication Order Taking? Sig Documenting Provider Last Dose Status Informant  albuterol (VENTOLIN HFA) 108 (90 Base) MCG/ACT inhaler [id number] No Inhale 2 puffs into the lungs every 6 (six) hours as needed for wheezing or shortness of breath.  Patient not taking: Reported on 12/23/2022   Miguel Rota, MD Not Taking Active            Med Note Luella Cook   Tue Dec 23, 2022  4:53 PM) Patient does not want to take  until she talks with her Dr  ALPRAZolam Prudy Feeler) 1 MG tablet [id number]  Take 1 mg by mouth at bedtime as needed. [provider]  Active Self           Med Note Rosalio Macadamia   Fri Dec 19, 2022  2:22 AM) Contraindication alert  atorvastatin (LIPITOR) 20 MG tablet [id number] No TAKE 1 TABLET BY MOUTH ONCE  DAILY  Patient not taking: Reported on 12/19/2022   Dale [place], MD Not Taking Active Self  azithromycin (ZITHROMAX) 250 MG tablet [id number] Yes Take 1 tablet (250 mg total) by mouth daily for 3 days. Miguel Rota, MD Taking Active   cholecalciferol (VITAMIN D3) 25 MCG (1000 UT) tablet [id number] Yes Take 1,000 Units by mouth daily. Taking 2,000mg  daily [provider] Taking Active Self  ELIQUIS 5 MG TABS tablet [id number] Yes TAKE 1 TABLET BY MOUTH TWICE  DAILY Dale [place], MD Taking Active Self  hydrochlorothiazide (HYDRODIURIL) 25 MG tablet [id number] Yes TAKE 1 TABLET BY MOUTH DAILY Dale [place], MD Taking Active Self  levothyroxine (SYNTHROID) 50 MCG tablet [id number] Yes Take 1 tablet (50 mcg total) by mouth daily. Dale [place], MD Taking Active Self  metoprolol tartrate (LOPRESSOR) 25 MG tablet [id number] Yes Take 1 tablet (25 mg total) by mouth 2 (two) times daily. Miguel Rota, MD Taking Active   nystatin (MYCOSTATIN) 100000 UNIT/ML suspension [id number] Yes Take 5 mLs (500,000 Units total) by mouth in the morning, at noon, and at bedtime. Swish and spit tid. Dale [place], MD Taking Active Self  potassium chloride (KLOR-CON) 10 MEQ tablet [id number] Yes Take 2 tablets (20 mEq total)  by mouth daily. TAKE 1 TABLET BY MOUTH  TWICE DAILY Amin, Ankit C, MD Taking Active   sertraline (ZOLOFT) 50 MG tablet [id number] Yes TAKE 1 TABLET BY MOUTH DAILY Dale [place], MD Taking Active Self            Home Care and Equipment/Supplies: Were Home Health Services Ordered?: NA Any new equipment or medical supplies ordered?: NA  Functional Questionnaire: Do you need  assistance with bathing/showering or dressing?: No Do you need assistance with meal preparation?: No Do you need assistance with eating?: No Do you have difficulty maintaining continence: No Do you need assistance with getting out of bed/getting out of a chair/moving?: No Do you have difficulty managing or taking your medications?: No  Follow up appointments reviewed: PCP Follow-up appointment confirmed?: Yes Date of PCP follow-up appointment?: 12/30/22 Follow-up Provider: Dr Lorin Picket [facility] Follow-up appointment confirmed?: NA Do you need transportation to your follow-up appointment?: No Do you understand care options if your condition(s) worsen?: Yes-patient verbalized understanding  SDOH Interventions Today    Flowsheet Row Most Recent Value  SDOH Interventions   Food Insecurity Interventions Intervention Not Indicated  Housing Interventions Intervention Not Indicated  Transportation Interventions Intervention Not Indicated  Utilities Interventions Intervention Not Indicated     Patient declined further outreach calls.  RN informed patient about the [facility] meal plan . Patient declined the benefit  Gean Maidens BSN RN Population Health- Transition of Care Team.  [facility] [phone]

## 2022-12-23 NOTE — Transitions of Care (Post Inpatient/ED Visit) (Signed)
   12/23/2022  Name: Bonnie Shaw MRN: [id number] DOB: 1950/12/23  Today's TOC FU Call Status: Today's TOC FU Call Status:: Unsuccessful Call (2nd Attempt) Unsuccessful Call (2nd Attempt) Date: 12/23/22  Attempted to reach the patient regarding the most recent Inpatient/ED visit.  Follow Up Plan: Additional outreach attempts will be made to reach the patient to complete the Transitions of Care (Post Inpatient/ED visit) call.   Gean Maidens BSN RN Population Health- Transition of Care Team.  [facility] [phone]

## 2022-12-30 ENCOUNTER — Ambulatory Visit: Payer: Medicare Other | Admitting: Internal Medicine

## 2022-12-30 ENCOUNTER — Other Ambulatory Visit (HOSPITAL_COMMUNITY)
Admission: RE | Admit: 2022-12-30 | Discharge: 2022-12-30 | Disposition: A | Discharge status: Home / Self Care | Payer: Medicare Other | Source: Ambulatory Visit | Attending: Cardiology | Admitting: Cardiology

## 2022-12-30 VITALS — BP 134/76 | HR 87 | Temp 98.0°F | Resp 16 | Ht 63.0 in | Wt 226.0 lb

## 2022-12-30 DIAGNOSIS — N898 Other specified noninflammatory disorders of vagina: Secondary | ICD-10-CM

## 2022-12-30 DIAGNOSIS — E039 Hypothyroidism, unspecified: Secondary | ICD-10-CM | POA: Diagnosis not present

## 2022-12-30 DIAGNOSIS — D6869 Other thrombophilia: Secondary | ICD-10-CM

## 2022-12-30 DIAGNOSIS — D649 Anemia, unspecified: Secondary | ICD-10-CM

## 2022-12-30 DIAGNOSIS — N760 Acute vaginitis: Secondary | ICD-10-CM | POA: Insufficient documentation

## 2022-12-30 DIAGNOSIS — E876 Hypokalemia: Secondary | ICD-10-CM

## 2022-12-30 DIAGNOSIS — R300 Dysuria: Secondary | ICD-10-CM | POA: Diagnosis not present

## 2022-12-30 DIAGNOSIS — F419 Anxiety disorder, unspecified: Secondary | ICD-10-CM

## 2022-12-30 DIAGNOSIS — I10 Essential (primary) hypertension: Secondary | ICD-10-CM

## 2022-12-30 DIAGNOSIS — E785 Hyperlipidemia, unspecified: Secondary | ICD-10-CM

## 2022-12-30 DIAGNOSIS — R739 Hyperglycemia, unspecified: Secondary | ICD-10-CM

## 2022-12-30 MED ORDER — NYSTATIN 100000 UNIT/GM EX CREA: 1.0000 | TOPICAL_CREAM | Freq: Two times a day (BID) | CUTANEOUS | 0 refills | Status: DC

## 2022-12-30 MED ORDER — NYSTATIN 100000 UNIT/ML MT SUSP: OROMUCOSAL | 0 refills | Status: DC

## 2022-12-30 NOTE — Progress Notes (Signed)
Subjective:    Patient ID: Bonnie Shaw, female    DOB: 1950/11/24, 72 y.o.   MRN: [id number]  Patient here for  Chief Complaint  Patient presents with   Hospitalization Follow-up    HPI Here for hospital follow up - admitted 12/18/22 - 12/21/22 - after presenting with generalized weakness.prior to admission, had MRI.  Took xanax prior to MRI. Also increased family stress. To ER - Diagnosed with covid in 10/2022. Later had pneumonia. On admission was in afib with RVR and hypokalemia. Concerns of pneumonia.  Procalcitonin negative. Rocephin discontinued.  Discharged to complete 5 days of azithromycin. Regarding the afib, recommended continuing eliquis.  Echo overall unremarkable.  Beta blocker uptitrated to 25mg  bid. Potassium repleted. Since being on the abx, has noticed some increased issues inside her mouth (thrush symptoms). Also reported increased pressure and urgency.  Took AZO.  Reported some vaginal itching. Still with increased stress/anxiety regarding above.    Past Medical History:  Diagnosis Date   Anxiety    Arthritis    Cataract    Complication of anesthesia    Woke up during colonoscopy and during Knee surgery. 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   Hyperlipidemia    Hypertension    Hyperthyroidism    s/p RAI therapy   Hypothyroidism    s/p ablation   Left atrial enlargement    severely enlarged   Migraine headache    2x/yr   Motion sickness    roller coasters   Obesity    Panic disorder    Persistent atrial fibrillation (HCC)    Snoring    Past Surgical History:  Procedure Laterality Date   ABDOMINAL HYSTERECTOMY     CARDIOVERSION N/A 09/11/2017   Procedure: CARDIOVERSION;  Surgeon: Chilton Si, MD;  Location: [facility] ENDOSCOPY;  Service: Cardiovascular;  Laterality: N/A;   CATARACT EXTRACTION     COLONOSCOPY WITH PROPOFOL N/A 03/11/2019    Procedure: COLONOSCOPY WITH PROPOFOL;  Surgeon: Wyline Mood, MD;  Location: [facility];  Service: Endoscopy;  Laterality: N/A;  colon   COLONOSCOPY WITH PROPOFOL N/A 07/30/2022   Procedure: COLONOSCOPY WITH PROPOFOL;  Surgeon: Toledo, Boykin Nearing, MD;  Location: [facility] ENDOSCOPY;  Service: Gastroenterology;  Laterality: N/A;   KNEE SURGERY Bilateral    replacements   POLYPECTOMY  03/11/2019   Procedure: POLYPECTOMY;  Surgeon: Wyline Mood, MD;  Location: [facility];  Service: Endoscopy;;   THYROID ABLATION  2010   Family History  Problem Relation Age of Onset   Alzheimer's disease Father    Stroke Mother    Cancer Sister        ovarian    Ovarian cancer Sister 36   Breast cancer Neg Hx    Social History   Socioeconomic History   Marital status: Married    Spouse name: Not on file   Number of children: 4   Years of education: Not on file   Highest education level: Not on file  Occupational History    Employer: UNEMPLOYED  Tobacco Use   Smoking status: Never   Smokeless tobacco: Never  Vaping Use   Vaping status: Never Used  Substance and Sexual Activity   Alcohol use: No   Drug use: No   Sexual activity: Not on file  Other Topics Concern   Not on file  Social History Narrative  Lives in [place] with husband, has 4 children and 15 grandkids         Attends [employer], 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[employer]   Retired [employer]   Social Determinants of [employer]: Low Risk  (05/09/2022)   Overall Financial Resource Strain (CARDIA)    Difficulty of Paying Living Expenses: Not hard at all  Food Insecurity: No Food Insecurity (12/23/2022)   Hunger Vital Sign    Worried About Running Out of Food in the Last Year: Never true    Ran Out of Food in the Last Year: Never true  Transportation Needs: No Transportation Needs (12/23/2022)   PRAPARE - Administrator, Civil Service (Medical): No     Lack of Transportation (Non-Medical): No  Physical Activity: Sufficiently Active (05/09/2022)   Exercise Vital Sign    Days of Exercise per Week: 5 days    Minutes of Exercise per Session: 30 min  Stress: No Stress Concern Present (05/09/2022)   [employer] of Occupational Health - Occupational Stress Questionnaire    Feeling of Stress : Not at all  Social Connections: Unknown (05/09/2022)   Social Connection and Isolation Panel [NHANES]    Frequency of Communication with Friends and Family: Not on file    Frequency of Social Gatherings with Friends and Family: Not on file    Attends Religious Services: Not on file    Active Member of Clubs or Organizations: Not on file    Attends Banker Meetings: Not on file    Marital Status: Married     Review of Systems  Constitutional:  Negative for unexpected weight change.       Some decreased appetite.   HENT:  Negative for sinus pressure.        No increased congestion.  No increased sinus pressure.   Respiratory:  Negative for chest tightness.        Breathing overall stable.   Cardiovascular:  Negative for chest pain and palpitations.  Gastrointestinal:  Negative for abdominal pain, diarrhea and vomiting.  Genitourinary:        Some urgency as outlined.  Vaginal itching.   Musculoskeletal:  Negative for joint swelling and myalgias.  Skin:  Negative for color change and rash.  Neurological:  Negative for dizziness and headaches.  Psychiatric/Behavioral:  Negative for dysphoric mood.        Increased stress/anxiety as outlined.        Objective:     BP 134/76   Pulse 87   Temp 98 F (36.7 C)   Resp 16   Ht 5\' 3"  (1.6 m)   Wt 226 lb (102.5 kg)   SpO2 98%   BMI 40.03 kg/m  Wt Readings from Last 3 Encounters:  12/30/22 226 lb (102.5 kg)  12/18/22 270 lb (122.5 kg)  11/14/22 232 lb (105.2 kg)    Physical Exam Vitals reviewed.  Constitutional:      General: She is not in acute distress.    Appearance:  Normal appearance.  HENT:     Head: Normocephalic and atraumatic.     Right Ear: External ear normal.     Left Ear: External ear normal.  Eyes:     General: No scleral icterus.       Right eye: No discharge.        Left eye: No discharge.     Conjunctiva/sclera: Conjunctivae normal.  Neck:     Thyroid: No thyromegaly.  Cardiovascular:     Rate and Rhythm: Normal rate and regular rhythm.  Pulmonary:     Effort: No respiratory distress.     Breath sounds: Normal breath sounds. No wheezing.  Abdominal:     General: Bowel sounds are normal.     Palpations: Abdomen is soft.     Tenderness: There is no abdominal tenderness.  Genitourinary:    Comments: Normal external genitalia.  Some perivaginal erythema. Vaginal vault without lesions.  KOH/wet prep obtained. Could not appreciate any adnexal masses or tenderness.   Musculoskeletal:        General: No swelling or tenderness.     Cervical back: Neck supple. No tenderness.  Lymphadenopathy:     Cervical: No cervical adenopathy.  Skin:    Findings: No erythema or rash.  Neurological:     Mental Status: She is alert.  Psychiatric:        Mood and Affect: Mood normal.        Behavior: Behavior normal.      Outpatient Encounter Medications as of 12/30/2022  Medication Sig   nystatin cream (MYCOSTATIN) Apply 1 Application topically 2 (two) times daily.   atorvastatin (LIPITOR) 20 MG tablet TAKE 1 TABLET BY MOUTH ONCE  DAILY (Patient not taking: Reported on 12/19/2022)   cholecalciferol (VITAMIN D3) 25 MCG (1000 UT) tablet Take 1,000 Units by mouth daily. Taking 2,000mg  daily   ELIQUIS 5 MG TABS tablet TAKE 1 TABLET BY MOUTH TWICE  DAILY   hydrochlorothiazide (HYDRODIURIL) 25 MG tablet TAKE 1 TABLET BY MOUTH DAILY   levothyroxine (SYNTHROID) 50 MCG tablet Take 1 tablet (50 mcg total) by mouth daily.   metoprolol tartrate (LOPRESSOR) 25 MG tablet Take 1 tablet (25 mg total) by mouth 2 (two) times daily.   nystatin (MYCOSTATIN) 100000  UNIT/ML suspension 5cc's swish and spit tid.   potassium chloride (KLOR-CON) 10 MEQ tablet Take 2 tablets (20 mEq total) by mouth daily. TAKE 1 TABLET BY MOUTH  TWICE DAILY   sertraline (ZOLOFT) 50 MG tablet TAKE 1 TABLET BY MOUTH DAILY   [DISCONTINUED] albuterol (VENTOLIN HFA) 108 (90 Base) MCG/ACT inhaler Inhale 2 puffs into the lungs every 6 (six) hours as needed for wheezing or shortness of breath. (Patient not taking: Reported on 12/23/2022)   [DISCONTINUED] ALPRAZolam (XANAX) 1 MG tablet Take 1 mg by mouth at bedtime as needed.   [DISCONTINUED] nystatin (MYCOSTATIN) 100000 UNIT/ML suspension Take 5 mLs (500,000 Units total) by mouth in the morning, at noon, and at bedtime. Swish and spit tid.   No facility-administered encounter medications on file as of 12/30/2022.    Medications reconciled with pt.   Lab Results  Component Value Date   WBC 8.2 12/21/2022   HGB 11.3 (L) 12/21/2022   HCT 32.7 (L) 12/21/2022   PLT 140 (L) 12/21/2022   GLUCOSE 103 (H) 12/21/2022   CHOL 170 11/11/2022   TRIG 181.0 (H) 11/11/2022   HDL 51.30 11/11/2022   LDLDIRECT 97.0 11/01/2018   LDLCALC 83 11/11/2022   ALT 16 12/18/2022   AST 19 12/18/2022   NA 136 12/21/2022   K 3.6 12/21/2022   CL 103 12/21/2022   CREATININE 0.57 12/21/2022   BUN 7 (L) 12/21/2022   CO2 26 12/21/2022   TSH 2.35 01/16/2022   INR 1.3 (H) 12/19/2022   HGBA1C 6.1 11/11/2022   MICROALBUR <0.7 06/11/2015    ECHOCARDIOGRAM COMPLETE  Result Date: 12/21/2022    ECHOCARDIOGRAM REPORT  Patient Name:   KATIE RUFF Date of Exam: 12/21/2022 Medical Rec #:  [id number]      Height:       63.0 in Accession #:    [id number]     Weight:       270.0 lb Date of Birth:  09-May-1950      BSA:          2.197 m Patient Age:    72 years       BP:           121/78 mmHg Patient Gender: F              HR:           98 bpm. Exam Location:  [facility] Procedure: 2D Echo Indications:     Atrial Fibrillation I48.91  History:         Patient has prior  history of Echocardiogram examinations, most                  recent 09/21/2017.  Sonographer:     Overton Mam RDCS, FASE Referring Phys:  [id number] Antonieta Iba Diagnosing Phys: Julien Nordmann MD IMPRESSIONS  1. Left ventricular ejection fraction, by estimation, is 55 to 60%. The left ventricle has normal function. The left ventricle has no regional wall motion abnormalities. Left ventricular diastolic parameters are indeterminate.  2. Right ventricular systolic function is normal. The right ventricular size is normal. There is mildly elevated pulmonary artery systolic pressure. The estimated right ventricular systolic pressure is 39.1 mmHg.  3. Left atrial size was mildly dilated.  4. The mitral valve is normal in structure. Mild to moderate mitral valve regurgitation. No evidence of mitral stenosis.  5. The aortic valve is tricuspid. Aortic valve regurgitation is not visualized. No aortic stenosis is present.  6. The inferior vena cava is normal in size with greater than 50% respiratory variability, suggesting right atrial pressure of 3 mmHg. FINDINGS  Left Ventricle: Left ventricular ejection fraction, by estimation, is 55 to 60%. The left ventricle has normal function. The left ventricle has no regional wall motion abnormalities. The left ventricular internal cavity size was normal in size. There is  no left ventricular hypertrophy. Left ventricular diastolic parameters are indeterminate. Right Ventricle: The right ventricular size is normal. No increase in right ventricular wall thickness. Right ventricular systolic function is normal. There is mildly elevated pulmonary artery systolic pressure. The tricuspid regurgitant velocity is 2.92  m/s, and with an assumed right atrial pressure of 5 mmHg, the estimated right ventricular systolic pressure is 39.1 mmHg. Left Atrium: Left atrial size was mildly dilated. Right Atrium: Right atrial size was normal in size. Pericardium: There is no evidence of pericardial  effusion. Mitral Valve: The mitral valve is normal in structure. Mild to moderate mitral valve regurgitation. No evidence of mitral valve stenosis. Tricuspid Valve: The tricuspid valve is normal in structure. Tricuspid valve regurgitation is mild . No evidence of tricuspid stenosis. Aortic Valve: The aortic valve is tricuspid. Aortic valve regurgitation is not visualized. No aortic stenosis is present. Aortic valve peak gradient measures 5.6 mmHg. Pulmonic Valve: The pulmonic valve was normal in structure. Pulmonic valve regurgitation is not visualized. No evidence of pulmonic stenosis. Aorta: The aortic root is normal in size and structure. Venous: The inferior vena cava is normal in size with greater than 50% respiratory variability, suggesting right atrial pressure of 3 mmHg. IAS/Shunts: No atrial level shunt detected by color flow Doppler.  LEFT VENTRICLE  PLAX 2D LVIDd:         4.40 cm   Diastology LVIDs:         3.10 cm   LV e' medial:    10.20 cm/s LV PW:         1.10 cm   LV E/e' medial:  11.7 LV IVS:        1.10 cm   LV e' lateral:   11.60 cm/s LVOT diam:     1.90 cm   LV E/e' lateral: 10.3 LV SV:         53 LV SV Index:   24 LVOT Area:     2.84 cm  RIGHT VENTRICLE RV Basal diam:  3.30 cm RV S prime:     12.80 cm/s TAPSE (M-mode): 1.6 cm LEFT ATRIUM             Index        RIGHT ATRIUM           Index LA diam:        5.10 cm 2.32 cm/m   RA Area:     32.70 cm LA Vol (A2C):   73.7 ml 33.55 ml/m  RA Volume:   123.00 ml 55.99 ml/m LA Vol (A4C):   76.1 ml 34.64 ml/m LA Biplane Vol: 78.0 ml 35.51 ml/m  AORTIC VALVE                 PULMONIC VALVE AV Area (Vmax): 2.69 cm     PV Vmax:        0.80 m/s AV Vmax:        118.00 cm/s  PV Peak grad:   2.6 mmHg AV Peak Grad:   5.6 mmHg     RVOT Peak grad: 2 mmHg LVOT Vmax:      112.00 cm/s LVOT Vmean:     72.600 cm/s LVOT VTI:       0.188 m  AORTA Ao Root diam: 3.50 cm Ao Asc diam:  3.10 cm MITRAL VALVE                TRICUSPID VALVE MV Area (PHT): 3.91 cm     TR  Peak grad:   34.1 mmHg MV Decel Time: 194 msec     TR Vmax:        292.00 cm/s MV E velocity: 119.00 cm/s                             SHUNTS                             Systemic VTI:  0.19 m                             Systemic Diam: 1.90 cm Julien Nordmann MD Electronically signed by Julien Nordmann MD Signature Date/Time: 12/21/2022/2:40:09 PM    Final        Assessment & Plan:  Hyperlipidemia, unspecified hyperlipidemia type Assessment & Plan: The 10-year ASCVD risk score (Arnett DK, et al., 2019) is: 18.3%   Values used to calculate the score:     Age: 63 years     Sex: Female     Is Non-Hispanic African American: No     Diabetic: No     Tobacco smoker: No     Systolic Blood Pressure: 134 mmHg  Is BP treated: Yes     HDL Cholesterol: 51.3 mg/dL     Total Cholesterol: 170 mg/dL  Low cholesterol diet and exercise. Lipitor. Follow lipid panel    Primary hypertension Assessment & Plan: Blood pressure as outlined.  On hctz.  Started on metoprolol in hospital. Follow pressures.  Follow metabolic panel.    Orders: -     Basic metabolic panel  Hypokalemia Assessment & Plan: Potassium replaced in hospital.  Recheck potassium today.   Orders: -     Basic metabolic panel  Hypothyroidism, unspecified type Assessment & Plan: On thyroid replacement.  Follow tsh.    Anemia, unspecified type Assessment & Plan: Hgb decreased recent check.  Recheck cbc, B12 and iron studies.   Orders: -     CBC with Differential/Platelet -     Vitamin B12 -     IBC + Ferritin  Dysuria Assessment & Plan: Check urine and culture to confirm no infection.   Orders: -     Urinalysis, Routine w reflex microscopic -     Urine Culture  Acute vaginitis -     Cervicovaginal ancillary only  Acquired thrombophilia (HCC) Assessment & Plan: With afib.  On eliquis.  Follow.    Anxiety Assessment & Plan: Increased stress/anxiety as outlined. Continue zoloft. Follow.      Hyperglycemia Assessment & Plan: Low carb diet and exercise.  Follow met b and a1c.    Vaginal irritation Assessment & Plan: Perivaginal erythema - nystatin cream as directed.  KOH/wet prep obtained.  Await results.    Other orders -     Nystatin; 5cc's swish and spit tid.  Dispense: 60 mL; Refill: 0 -     Nystatin; Apply 1 Application topically 2 (two) times daily.  Dispense: 30 g; Refill: 0     Dale Damon, MD

## 2022-12-31 ENCOUNTER — Telehealth: Payer: Self-pay | Admitting: Internal Medicine

## 2022-12-31 LAB — URINE CULTURE
MICRO NUMBER:: 15781995
SPECIMEN QUALITY:: ADEQUATE

## 2022-12-31 LAB — URINALYSIS, ROUTINE W REFLEX MICROSCOPIC
Bilirubin Urine: NEGATIVE
Nitrite: POSITIVE — AB
Specific Gravity, Urine: 1.02 (ref 1.000–1.030)
Urine Glucose: NEGATIVE
Urobilinogen, UA: 0.2 (ref 0.0–1.0)
pH: 6.5 (ref 5.0–8.0)

## 2022-12-31 LAB — CERVICOVAGINAL ANCILLARY ONLY
Bacterial Vaginitis (gardnerella): NEGATIVE
Candida Glabrata: POSITIVE — AB
Candida Vaginitis: NEGATIVE
Comment: NEGATIVE
Comment: NEGATIVE
Comment: NEGATIVE

## 2022-12-31 NOTE — Telephone Encounter (Signed)
Bonnie Shaw, we discussed.  Will plan to check labs at her 01/13/23 appt.  See result note.

## 2022-12-31 NOTE — Telephone Encounter (Signed)
Pt called stating she came for an appointment on yesterday and when she looked at her AVS it stated she had labs done but she did not. Pt only had urine done

## 2022-12-31 NOTE — Telephone Encounter (Signed)
Was pt supposed to have lab work done after her appt yesterday?

## 2022-12-31 NOTE — Telephone Encounter (Signed)
Pt aware.

## 2023-01-03 ENCOUNTER — Encounter: Payer: Self-pay | Admitting: Internal Medicine

## 2023-01-03 DIAGNOSIS — N898 Other specified noninflammatory disorders of vagina: Secondary | ICD-10-CM | POA: Insufficient documentation

## 2023-01-03 DIAGNOSIS — R300 Dysuria: Secondary | ICD-10-CM | POA: Insufficient documentation

## 2023-01-03 NOTE — Assessment & Plan Note (Signed)
Perivaginal erythema - nystatin cream as directed.  KOH/wet prep obtained.  Await results.

## 2023-01-03 NOTE — Assessment & Plan Note (Signed)
On thyroid replacement.  Follow tsh.  

## 2023-01-03 NOTE — Assessment & Plan Note (Signed)
Increased stress/anxiety as outlined. Continue zoloft. Follow.

## 2023-01-03 NOTE — Assessment & Plan Note (Signed)
Hgb decreased recent check.  Recheck cbc, B12 and iron studies.

## 2023-01-03 NOTE — Assessment & Plan Note (Signed)
Low carb diet and exercise.  Follow met b and a1c.   

## 2023-01-03 NOTE — Assessment & Plan Note (Signed)
Potassium replaced in hospital.  Recheck potassium today.

## 2023-01-03 NOTE — Assessment & Plan Note (Signed)
With afib.  On eliquis.  Follow.

## 2023-01-03 NOTE — Assessment & Plan Note (Signed)
Blood pressure as outlined.  On hctz.  Started on metoprolol in hospital. Follow pressures.  Follow metabolic panel.

## 2023-01-03 NOTE — Assessment & Plan Note (Signed)
Check urine and culture to confirm no infection.  

## 2023-01-03 NOTE — Assessment & Plan Note (Signed)
The 10-year ASCVD risk score (Arnett DK, et al., 2019) is: 18.3%   Values used to calculate the score:     Age: 72 years     Sex: Female     Is Non-Hispanic African American: No     Diabetic: No     Tobacco smoker: No     Systolic Blood Pressure: 134 mmHg     Is BP treated: Yes     HDL Cholesterol: 51.3 mg/dL     Total Cholesterol: 170 mg/dL  Low cholesterol diet and exercise. Lipitor. Follow lipid panel

## 2023-01-13 ENCOUNTER — Ambulatory Visit: Payer: Medicare Other | Admitting: Internal Medicine

## 2023-01-13 VITALS — BP 114/74 | HR 91 | Temp 98.0°F | Resp 16 | Ht 63.0 in | Wt 228.4 lb

## 2023-01-13 DIAGNOSIS — F419 Anxiety disorder, unspecified: Secondary | ICD-10-CM

## 2023-01-13 DIAGNOSIS — E876 Hypokalemia: Secondary | ICD-10-CM | POA: Diagnosis not present

## 2023-01-13 DIAGNOSIS — I10 Essential (primary) hypertension: Secondary | ICD-10-CM

## 2023-01-13 DIAGNOSIS — D649 Anemia, unspecified: Secondary | ICD-10-CM

## 2023-01-13 DIAGNOSIS — E785 Hyperlipidemia, unspecified: Secondary | ICD-10-CM

## 2023-01-13 DIAGNOSIS — R739 Hyperglycemia, unspecified: Secondary | ICD-10-CM

## 2023-01-13 DIAGNOSIS — D6869 Other thrombophilia: Secondary | ICD-10-CM

## 2023-01-13 DIAGNOSIS — E039 Hypothyroidism, unspecified: Secondary | ICD-10-CM

## 2023-01-13 DIAGNOSIS — I4821 Permanent atrial fibrillation: Secondary | ICD-10-CM

## 2023-01-13 NOTE — Progress Notes (Signed)
Subjective:    Patient ID: Bonnie Shaw, female    DOB: November 09, 1950, 72 y.o.   MRN: [id number]  Patient here for  Chief Complaint  Patient presents with   Medical Management of Chronic Issues    HPI Here for a scheduled follow up - admitted 12/18/22 - 12/21/22 - after presenting with generalized weakness. On admission was in afib with RVR and hypokalemia. Concerns of pneumonia. Procalcitonin negative. Rocephin discontinued. Discharged to complete 5 days of azithromycin. Regarding the afib, recommended continuing eliquis. Echo overall unremarkable. Beta blocker uptitrated to 25mg  bid. Potassium repleted. Comes in today for follow up regarding above. She is doing better. Feeling better. Handling stress. Stays active. No chest pain or sob reported. No increased heart rate or palpitations. Main concern is her back. Planning for ESI. Discussed stopping eliquis for procedure. Discussed risk of stopping. Her back is limiting her activity.    Past Medical History:  Diagnosis Date   Anxiety    Arthritis    Cataract    Complication of anesthesia    Woke up during colonoscopy and during Knee surgery. 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   Hyperlipidemia    Hypertension    Hyperthyroidism    s/p RAI therapy   Hypothyroidism    s/p ablation   Left atrial enlargement    severely enlarged   Migraine headache    2x/yr   Motion sickness    roller coasters   Obesity    Panic disorder    Persistent atrial fibrillation (HCC)    Snoring    Past Surgical History:  Procedure Laterality Date   ABDOMINAL HYSTERECTOMY     CARDIOVERSION N/A 09/11/2017   Procedure: CARDIOVERSION;  Surgeon: Chilton Si, MD;  Location: [facility] ENDOSCOPY;  Service: Cardiovascular;  Laterality: N/A;   CATARACT EXTRACTION     COLONOSCOPY WITH PROPOFOL N/A 03/11/2019   Procedure: COLONOSCOPY WITH  PROPOFOL;  Surgeon: Wyline Mood, MD;  Location: [facility];  Service: Endoscopy;  Laterality: N/A;  colon   COLONOSCOPY WITH PROPOFOL N/A 07/30/2022   Procedure: COLONOSCOPY WITH PROPOFOL;  Surgeon: Toledo, Boykin Nearing, MD;  Location: [facility] ENDOSCOPY;  Service: Gastroenterology;  Laterality: N/A;   KNEE SURGERY Bilateral    replacements   POLYPECTOMY  03/11/2019   Procedure: POLYPECTOMY;  Surgeon: Wyline Mood, MD;  Location: [facility];  Service: Endoscopy;;   THYROID ABLATION  2010   Family History  Problem Relation Age of Onset   Alzheimer's disease Father    Stroke Mother    Cancer Sister        ovarian    Ovarian cancer Sister 18   Breast cancer Neg Hx    Social History   Socioeconomic History   Marital status: Married    Spouse name: Not on file   Number of children: 4   Years of education: Not on file   Highest education level: Not on file  Occupational History    Employer: UNEMPLOYED  Tobacco Use   Smoking status: Never   Smokeless tobacco: Never  Vaping Use   Vaping status: Never Used  Substance and Sexual Activity   Alcohol use: No   Drug use: No   Sexual activity: Not on file  Other Topics Concern   Not on file  Social History Narrative   Lives in [place] with husband, has  4 children and 15 grandkids         Attends [employer], 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[employer]   Retired [employer]   Social Drivers of [employer]: Low Risk  (05/09/2022)   Overall Financial Resource Strain (CARDIA)    Difficulty of Paying Living Expenses: Not hard at all  Food Insecurity: No Food Insecurity (12/23/2022)   Hunger Vital Sign    Worried About Running Out of Food in the Last Year: Never true    Ran Out of Food in the Last Year: Never true  Transportation Needs: No Transportation Needs (12/23/2022)   PRAPARE - Administrator, Civil Service (Medical): No    Lack of Transportation  (Non-Medical): No  Physical Activity: Sufficiently Active (05/09/2022)   Exercise Vital Sign    Days of Exercise per Week: 5 days    Minutes of Exercise per Session: 30 min  Stress: No Stress Concern Present (05/09/2022)   [employer] of Occupational Health - Occupational Stress Questionnaire    Feeling of Stress : Not at all  Social Connections: Unknown (05/09/2022)   Social Connection and Isolation Panel [NHANES]    Frequency of Communication with Friends and Family: Not on file    Frequency of Social Gatherings with Friends and Family: Not on file    Attends Religious Services: Not on file    Active Member of Clubs or Organizations: Not on file    Attends Banker Meetings: Not on file    Marital Status: Married     Review of Systems  Constitutional:  Negative for appetite change and unexpected weight change.  HENT:  Negative for congestion and sinus pressure.   Respiratory:  Negative for cough and chest tightness.        Breathing stable. No increased sob.   Cardiovascular:  Negative for chest pain and palpitations.  Gastrointestinal:  Negative for abdominal pain, diarrhea, nausea and vomiting.  Genitourinary:  Negative for difficulty urinating and dysuria.  Musculoskeletal:  Positive for back pain. Negative for joint swelling and myalgias.  Skin:  Negative for color change and rash.  Neurological:  Negative for dizziness and headaches.  Psychiatric/Behavioral:  Negative for agitation and dysphoric mood.        Objective:     BP 114/74   Pulse 91   Temp 98 F (36.7 C)   Resp 16   Ht 5\' 3"  (1.6 m)   Wt 228 lb 6.4 oz (103.6 kg)   SpO2 98%   BMI 40.46 kg/m  Wt Readings from Last 3 Encounters:  01/13/23 228 lb 6.4 oz (103.6 kg)  12/30/22 226 lb (102.5 kg)  12/18/22 270 lb (122.5 kg)    Physical Exam Vitals reviewed.  Constitutional:      General: She is not in acute distress.    Appearance: Normal appearance.  HENT:     Head: Normocephalic and  atraumatic.     Right Ear: External ear normal.     Left Ear: External ear normal.     Mouth/Throat:     Pharynx: No oropharyngeal exudate or posterior oropharyngeal erythema.  Eyes:     General: No scleral icterus.       Right eye: No discharge.        Left eye: No discharge.     Conjunctiva/sclera: Conjunctivae normal.  Neck:     Thyroid: No thyromegaly.  Cardiovascular:  Rate and Rhythm: Normal rate and regular rhythm.  Pulmonary:     Effort: No respiratory distress.     Breath sounds: Normal breath sounds. No wheezing.  Abdominal:     General: Bowel sounds are normal.     Palpations: Abdomen is soft.     Tenderness: There is no abdominal tenderness.  Musculoskeletal:        General: No swelling or tenderness.     Cervical back: Neck supple. No tenderness.  Lymphadenopathy:     Cervical: No cervical adenopathy.  Skin:    Findings: No erythema or rash.  Neurological:     Mental Status: She is alert.  Psychiatric:        Mood and Affect: Mood normal.        Behavior: Behavior normal.      Outpatient Encounter Medications as of 01/13/2023  Medication Sig   atorvastatin (LIPITOR) 20 MG tablet TAKE 1 TABLET BY MOUTH ONCE  DAILY (Patient not taking: Reported on 12/19/2022)   cholecalciferol (VITAMIN D3) 25 MCG (1000 UT) tablet Take 1,000 Units by mouth daily. Taking 2,000mg  daily   ELIQUIS 5 MG TABS tablet TAKE 1 TABLET BY MOUTH TWICE  DAILY   hydrochlorothiazide (HYDRODIURIL) 25 MG tablet TAKE 1 TABLET BY MOUTH DAILY   levothyroxine (SYNTHROID) 50 MCG tablet Take 1 tablet (50 mcg total) by mouth daily.   metoprolol tartrate (LOPRESSOR) 25 MG tablet Take 1 tablet (25 mg total) by mouth 2 (two) times daily.   potassium chloride (KLOR-CON) 10 MEQ tablet Take 2 tablets (20 mEq total) by mouth daily. TAKE 1 TABLET BY MOUTH  TWICE DAILY   sertraline (ZOLOFT) 50 MG tablet TAKE 1 TABLET BY MOUTH DAILY   [DISCONTINUED] nystatin (MYCOSTATIN) 100000 UNIT/ML suspension 5cc's swish  and spit tid.   [DISCONTINUED] nystatin cream (MYCOSTATIN) Apply 1 Application topically 2 (two) times daily.   No facility-administered encounter medications on file as of 01/13/2023.     Lab Results  Component Value Date   WBC 7.4 01/13/2023   HGB 14.3 01/13/2023   HCT 42.3 01/13/2023   PLT 252.0 01/13/2023   GLUCOSE 112 (H) 01/13/2023   CHOL 170 11/11/2022   TRIG 181.0 (H) 11/11/2022   HDL 51.30 11/11/2022   LDLDIRECT 97.0 11/01/2018   LDLCALC 83 11/11/2022   ALT 16 12/18/2022   AST 19 12/18/2022   NA 141 01/13/2023   K 3.9 01/13/2023   CL 105 01/13/2023   CREATININE 0.73 01/13/2023   BUN 15 01/13/2023   CO2 27 01/13/2023   TSH 2.35 01/16/2022   INR 1.3 (H) 12/19/2022   HGBA1C 6.1 11/11/2022   MICROALBUR <0.7 06/11/2015       Assessment & Plan:  Anemia, unspecified type Assessment & Plan: Recheck cbc.   Orders: -     CBC with Differential/Platelet -     IBC + Ferritin -     Vitamin B12  Hypokalemia -     Basic metabolic panel  Acquired thrombophilia (HCC) Assessment & Plan: With afib.  On eliquis.  Follow.    Anxiety Assessment & Plan: Increased stress/anxiety as outlined. Continue zoloft. Doing better. Follow.     Hyperglycemia Assessment & Plan: Low carb diet and exercise.  Follow met b and a1c.    Hyperlipidemia, unspecified hyperlipidemia type Assessment & Plan: The 10-year ASCVD risk score (Arnett DK, et al., 2019) is: 13.6%   Values used to calculate the score:     Age: 46 years     Sex:  Female     Is Non-Hispanic African American: No     Diabetic: No     Tobacco smoker: No     Systolic Blood Pressure: 114 mmHg     Is BP treated: Yes     HDL Cholesterol: 51.3 mg/dL     Total Cholesterol: 170 mg/dL  Low cholesterol diet and exercise. Lipitor. Follow lipid panel    Primary hypertension Assessment & Plan: Blood pressure as outlined.  On hctz.  Started on metoprolol in hospital. Follow pressures.  Follow metabolic panel.  Discussed  continuing metoprolol.  Agreeable.    Hypothyroidism, unspecified type Assessment & Plan: On thyroid replacement.  Follow tsh.    Permanent atrial fibrillation [facility]) Assessment & Plan: Appears to be in SR. On eliquis. No chest pain.  Breathing stable. Discussed upcoming procedure. Back limiting her activity. Needs the ESI to help with mobility. Discussed stopping eliquis prior to procedure. Discussed risk of stopping. Understands risk.        Dale [place], MD

## 2023-01-14 LAB — CBC WITH DIFFERENTIAL/PLATELET
Basophils Absolute: 0.1 10*3/uL (ref 0.0–0.1)
Basophils Relative: 1.2 % (ref 0.0–3.0)
Eosinophils Absolute: 0.1 10*3/uL (ref 0.0–0.7)
Eosinophils Relative: 1.3 % (ref 0.0–5.0)
HCT: 42.3 % (ref 36.0–46.0)
Hemoglobin: 14.3 g/dL (ref 12.0–15.0)
Lymphocytes Relative: 39.8 % (ref 12.0–46.0)
Lymphs Abs: 3 10*3/uL (ref 0.7–4.0)
MCHC: 33.8 g/dL (ref 30.0–36.0)
MCV: 96.3 fL (ref 78.0–100.0)
Monocytes Absolute: 0.6 10*3/uL (ref 0.1–1.0)
Monocytes Relative: 8.6 % (ref 3.0–12.0)
Neutro Abs: 3.7 10*3/uL (ref 1.4–7.7)
Neutrophils Relative %: 49.1 % (ref 43.0–77.0)
Platelets: 252 10*3/uL (ref 150.0–400.0)
RBC: 4.4 Mil/uL (ref 3.87–5.11)
RDW: 14.3 % (ref 11.5–15.5)
WBC: 7.4 10*3/uL (ref 4.0–10.5)

## 2023-01-14 LAB — IBC + FERRITIN
Ferritin: 46.5 ng/mL (ref 10.0–291.0)
Iron: 113 ug/dL (ref 42–145)
Saturation Ratios: 28.8 % (ref 20.0–50.0)
TIBC: 392 ug/dL (ref 250.0–450.0)
Transferrin: 280 mg/dL (ref 212.0–360.0)

## 2023-01-14 LAB — BASIC METABOLIC PANEL
BUN: 15 mg/dL (ref 6–23)
CO2: 27 meq/L (ref 19–32)
Calcium: 9.3 mg/dL (ref 8.4–10.5)
Chloride: 105 meq/L (ref 96–112)
Creatinine, Ser: 0.73 mg/dL (ref 0.40–1.20)
GFR: 81.65 mL/min (ref >60.00)
Glucose, Bld: 112 mg/dL — ABNORMAL HIGH (ref 70–99)
Potassium: 3.9 meq/L (ref 3.5–5.1)
Sodium: 141 meq/L (ref 135–145)

## 2023-01-14 LAB — VITAMIN B12: Vitamin B-12: 114 pg/mL — ABNORMAL LOW (ref 211–911)

## 2023-01-16 ENCOUNTER — Ambulatory Visit (INDEPENDENT_AMBULATORY_CARE_PROVIDER_SITE_OTHER): Payer: Medicare Other

## 2023-01-16 DIAGNOSIS — E538 Deficiency of other specified B group vitamins: Secondary | ICD-10-CM | POA: Diagnosis not present

## 2023-01-16 MED ORDER — CYANOCOBALAMIN 1000 MCG/ML IJ SOLN
1000.0000 ug | Freq: Once | INTRAMUSCULAR | Status: AC
  Administered 2023-01-16: 1000 ug via INTRAMUSCULAR

## 2023-01-16 NOTE — Progress Notes (Signed)
Patient presented for 1ST  B 12 injection to left deltoid, patient voiced no concerns nor showed any signs of distress during injection

## 2023-01-18 ENCOUNTER — Encounter: Payer: Self-pay | Admitting: Internal Medicine

## 2023-01-18 NOTE — Assessment & Plan Note (Signed)
With afib.  On eliquis.  Follow.

## 2023-01-18 NOTE — Assessment & Plan Note (Signed)
Appears to be in SR. On eliquis. No chest pain.  Breathing stable. Discussed upcoming procedure. Back limiting her activity. Needs the ESI to help with mobility. Discussed stopping eliquis prior to procedure. Discussed risk of stopping. Understands risk.

## 2023-01-18 NOTE — Assessment & Plan Note (Signed)
Low carb diet and exercise.  Follow met b and a1c.   

## 2023-01-18 NOTE — Assessment & Plan Note (Signed)
Blood pressure as outlined.  On hctz.  Started on metoprolol in hospital. Follow pressures.  Follow metabolic panel.  Discussed continuing metoprolol.  Agreeable.

## 2023-01-18 NOTE — Assessment & Plan Note (Signed)
Recheck cbc.  

## 2023-01-18 NOTE — Assessment & Plan Note (Signed)
On thyroid replacement.  Follow tsh.  

## 2023-01-18 NOTE — Assessment & Plan Note (Signed)
Increased stress/anxiety as outlined. Continue zoloft. Doing better. Follow.

## 2023-01-18 NOTE — Assessment & Plan Note (Signed)
The 10-year ASCVD risk score (Arnett DK, et al., 2019) is: 13.6%   Values used to calculate the score:     Age: 72 years     Sex: Female     Is Non-Hispanic African American: No     Diabetic: No     Tobacco smoker: No     Systolic Blood Pressure: 114 mmHg     Is BP treated: Yes     HDL Cholesterol: 51.3 mg/dL     Total Cholesterol: 170 mg/dL  Low cholesterol diet and exercise. Lipitor. Follow lipid panel

## 2023-01-19 ENCOUNTER — Other Ambulatory Visit: Payer: Self-pay | Admitting: Internal Medicine

## 2023-01-19 ENCOUNTER — Telehealth: Payer: Self-pay

## 2023-01-19 NOTE — Telephone Encounter (Signed)
Prescription Request  01/19/2023  LOV: Visit date not found  What is the name of the medication or equipment? metoprolol tartrate (LOPRESSOR) 25 MG tablet (90-day supply)  Have you contacted your pharmacy to request a refill? No   Which pharmacy would you like this sent to?  [facility]) [place], [place] - [address] [place] [address] [place] [address] Phone: [phone] Fax: [phone]  [facility] - [place], [place] - [address] [place] [address] Phone: [phone] Fax: [phone]    Patient notified that their request is being sent to the clinical staff for review and that they should receive a response within 2 business days.   Please advise at [facility] [phone]  Patient states she would like this medication to go to her home delivery, but she is not sure which one they are using.  Patient states she was given this medication in the hospital and she needs a refill.  Patient states she has several days left of this medication.  Patient states she has a sore mouth that she thinks may be related to her B12 deficiency.  Patient states she just had a B12 shot this past Friday and she has another one scheduled for this coming Friday.  Patient states we may use [facility] on the [address] of [address] if we need to call something in for her mouth.

## 2023-01-20 NOTE — Telephone Encounter (Signed)
Ok to send in metoprolol, but her message states wants rx to go to mail order, but then states only has a few days of medication left.  Need to clarify where to send rx.

## 2023-01-20 NOTE — Telephone Encounter (Signed)
Patient wanting to know if you will fill her metoprolol? She was put back on this medication at the ED and only given a 30 day supply. Patient stated that her mouth is ok- just sensitive but she has been reading and thinks that this is because her b12 level was so low. I can send in metoprolol rx if you are ok with this

## 2023-01-21 ENCOUNTER — Other Ambulatory Visit: Payer: Self-pay

## 2023-01-21 ENCOUNTER — Other Ambulatory Visit: Payer: Self-pay | Admitting: Internal Medicine

## 2023-01-21 MED ORDER — METOPROLOL TARTRATE 25 MG PO TABS: 25.0000 mg | ORAL_TABLET | Freq: Two times a day (BID) | ORAL | 1 refills | Status: DC

## 2023-01-21 NOTE — Telephone Encounter (Signed)
Rx sent to local per patient request.

## 2023-01-23 ENCOUNTER — Ambulatory Visit (INDEPENDENT_AMBULATORY_CARE_PROVIDER_SITE_OTHER): Payer: Medicare Other

## 2023-01-23 DIAGNOSIS — E538 Deficiency of other specified B group vitamins: Secondary | ICD-10-CM

## 2023-01-23 MED ORDER — CYANOCOBALAMIN 1000 MCG/ML IJ SOLN
1000.0000 ug | Freq: Once | INTRAMUSCULAR | Status: AC
  Administered 2023-01-23: 1000 ug via INTRAMUSCULAR

## 2023-01-23 NOTE — Progress Notes (Signed)
Patient presented for B 12 injection to left deltoid, patient voiced no concerns nor showed any signs of distress during injection. 

## 2023-02-02 ENCOUNTER — Ambulatory Visit (INDEPENDENT_AMBULATORY_CARE_PROVIDER_SITE_OTHER): Payer: Medicare Other

## 2023-02-02 DIAGNOSIS — E538 Deficiency of other specified B group vitamins: Secondary | ICD-10-CM

## 2023-02-02 MED ORDER — CYANOCOBALAMIN 1000 MCG/ML IJ SOLN: 1000.0000 ug | Freq: Once | INTRAMUSCULAR | Status: DC

## 2023-02-02 MED ORDER — CYANOCOBALAMIN 1000 MCG/ML IJ SOLN
1000.0000 ug | Freq: Once | INTRAMUSCULAR | Status: AC
  Administered 2023-02-02: 1000 ug via INTRAMUSCULAR

## 2023-02-02 NOTE — Progress Notes (Signed)
Patient presented for B 12 injection to left deltoid, patient voiced no concerns nor showed any signs of distress during injection. Pt refused injection in her right arm.

## 2023-02-09 ENCOUNTER — Ambulatory Visit (INDEPENDENT_AMBULATORY_CARE_PROVIDER_SITE_OTHER): Payer: Medicare Other

## 2023-02-09 DIAGNOSIS — E538 Deficiency of other specified B group vitamins: Secondary | ICD-10-CM

## 2023-02-09 MED ORDER — CYANOCOBALAMIN 1000 MCG/ML IJ SOLN
1000.0000 ug | Freq: Once | INTRAMUSCULAR | Status: AC
  Administered 2023-02-09: 1000 ug via INTRAMUSCULAR

## 2023-02-09 NOTE — Progress Notes (Signed)
 Pt presented for their vitamin B12 injection. Pt was identified through two identifiers. Pt tolerated shot well in their right deltoid.

## 2023-02-19 ENCOUNTER — Ambulatory Visit (INDEPENDENT_AMBULATORY_CARE_PROVIDER_SITE_OTHER): Payer: Medicare Other | Admitting: Internal Medicine

## 2023-02-19 VITALS — BP 122/70 | HR 90 | Temp 98.0°F | Resp 16 | Ht 63.0 in | Wt 228.0 lb

## 2023-02-19 DIAGNOSIS — D6869 Other thrombophilia: Secondary | ICD-10-CM

## 2023-02-19 DIAGNOSIS — K862 Cyst of pancreas: Secondary | ICD-10-CM

## 2023-02-19 DIAGNOSIS — E785 Hyperlipidemia, unspecified: Secondary | ICD-10-CM | POA: Diagnosis not present

## 2023-02-19 DIAGNOSIS — E559 Vitamin D deficiency, unspecified: Secondary | ICD-10-CM

## 2023-02-19 DIAGNOSIS — I4821 Permanent atrial fibrillation: Secondary | ICD-10-CM

## 2023-02-19 DIAGNOSIS — R739 Hyperglycemia, unspecified: Secondary | ICD-10-CM

## 2023-02-19 DIAGNOSIS — E039 Hypothyroidism, unspecified: Secondary | ICD-10-CM | POA: Diagnosis not present

## 2023-02-19 DIAGNOSIS — I10 Essential (primary) hypertension: Secondary | ICD-10-CM | POA: Diagnosis not present

## 2023-02-19 DIAGNOSIS — E538 Deficiency of other specified B group vitamins: Secondary | ICD-10-CM | POA: Insufficient documentation

## 2023-02-19 NOTE — Progress Notes (Signed)
Subjective:    Patient ID: Bonnie Shaw, female    DOB: [birth year], 73 y.o.   MRN: [id number]  Patient here for  Chief Complaint  Patient presents with   Medical Management of Chronic Issues    HPI Here for a scheduled follow up - follow up regarding afib, hypertension and hypercholesterolemia. She stays active. No chest pain reported.  Breathing overall stable. Continues on eliquis. Continues on metoprolol. Started B12 injections. Feels better since starting. Some increased mucus - coughing. Discussed nasacort. Feeling better overall. Eating. No vomiting.    Past Medical History:  Diagnosis Date   Anxiety    Arthritis    Cataract    Complication of anesthesia    Woke up during colonoscopy and during Knee surgery. 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   Hyperlipidemia    Hypertension    Hyperthyroidism    s/p RAI therapy   Hypothyroidism    s/p ablation   Left atrial enlargement    severely enlarged   Migraine headache    2x/yr   Motion sickness    roller coasters   Obesity    Panic disorder    Persistent atrial fibrillation (HCC)    Snoring    Past Surgical History:  Procedure Laterality Date   ABDOMINAL HYSTERECTOMY     CARDIOVERSION N/A 09/11/2017   Procedure: CARDIOVERSION;  Surgeon: Chilton Si, MD;  Location: [facility] ENDOSCOPY;  Service: Cardiovascular;  Laterality: N/A;   CATARACT EXTRACTION     COLONOSCOPY WITH PROPOFOL N/A 03/11/2019   Procedure: COLONOSCOPY WITH PROPOFOL;  Surgeon: Wyline Mood, MD;  Location: [facility];  Service: Endoscopy;  Laterality: N/A;  colon   COLONOSCOPY WITH PROPOFOL N/A 07/30/2022   Procedure: COLONOSCOPY WITH PROPOFOL;  Surgeon: Toledo, Boykin Nearing, MD;  Location: [facility] ENDOSCOPY;  Service: Gastroenterology;  Laterality: N/A;   KNEE SURGERY Bilateral    replacements   POLYPECTOMY  03/11/2019   Procedure:  POLYPECTOMY;  Surgeon: Wyline Mood, MD;  Location: [facility];  Service: Endoscopy;;   THYROID ABLATION  2010   Family History  Problem Relation Age of Onset   Alzheimer's disease Father    Stroke Mother    Cancer Sister        ovarian    Ovarian cancer Sister 73   Breast cancer Neg Hx    Social History   Socioeconomic History   Marital status: Married    Spouse name: Not on file   Number of children: 4   Years of education: Not on file   Highest education level: Associate degree: occupational, Scientist, product/process development, or vocational program  Occupational History    Employer: UNEMPLOYED  Tobacco Use   Smoking status: Never   Smokeless tobacco: Never  Vaping Use   Vaping status: Never Used  Substance and Sexual Activity   Alcohol use: No   Drug use: No   Sexual activity: Not on file  Other Topics Concern   Not on file  Social History Narrative   Lives in [place] with husband, has 4 children and 15 grandkids         Attends [employer], 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[employer]   Retired [employer]   Social Drivers of Health   Financial Resource Strain: Low Risk  (02/18/2023)   Overall Financial  Resource Strain (CARDIA)    Difficulty of Paying Living Expenses: Not hard at all  Food Insecurity: No Food Insecurity (02/18/2023)   Hunger Vital Sign    Worried About Running Out of Food in the Last Year: Never true    Ran Out of Food in the Last Year: Never true  Transportation Needs: No Transportation Needs (02/18/2023)   PRAPARE - Administrator, Civil Service (Medical): No    Lack of Transportation (Non-Medical): No  Physical Activity: Sufficiently Active (02/18/2023)   Exercise Vital Sign    Days of Exercise per Week: 6 days    Minutes of Exercise per Session: 30 min  Stress: No Stress Concern Present (02/18/2023)   [employer] of Occupational Health - Occupational Stress Questionnaire    Feeling of Stress : Not at all   Social Connections: Socially Integrated (02/18/2023)   Social Connection and Isolation Panel [NHANES]    Frequency of Communication with Friends and Family: More than three times a week    Frequency of Social Gatherings with Friends and Family: More than three times a week    Attends Religious Services: More than 4 times per year    Active Member of [employer] or Organizations: Yes    Attends Engineer, structural: More than 4 times per year    Marital Status: Married     Review of Systems  Constitutional:  Negative for appetite change and unexpected weight change.  HENT:  Negative for sinus pressure.        Some mucus as outlined.   Respiratory:  Positive for cough. Negative for chest tightness and shortness of breath.   Cardiovascular:  Negative for chest pain and palpitations.  Gastrointestinal:  Negative for abdominal pain, diarrhea, nausea and vomiting.  Genitourinary:  Negative for difficulty urinating and dysuria.  Musculoskeletal:  Negative for joint swelling and myalgias.  Skin:  Negative for color change and rash.  Neurological:  Negative for dizziness and headaches.  Psychiatric/Behavioral:  Negative for agitation and dysphoric mood.        Objective:     BP 122/70   Pulse 90   Temp 98 F (36.7 C)   Resp 16   Ht 5\' 3"  (1.6 m)   Wt 228 lb (103.4 kg)   SpO2 97%   BMI 40.39 kg/m  Wt Readings from Last 3 Encounters:  02/19/23 228 lb (103.4 kg)  01/13/23 228 lb 6.4 oz (103.6 kg)  12/30/22 226 lb (102.5 kg)    Physical Exam Vitals reviewed.  Constitutional:      General: She is not in acute distress.    Appearance: Normal appearance.  HENT:     Head: Normocephalic and atraumatic.     Right Ear: External ear normal.     Left Ear: External ear normal.  Eyes:     General: No scleral icterus.       Right eye: No discharge.        Left eye: No discharge.     Conjunctiva/sclera: Conjunctivae normal.  Neck:     Thyroid: No thyromegaly.  Cardiovascular:      Rate and Rhythm: Normal rate and regular rhythm.  Pulmonary:     Effort: No respiratory distress.     Breath sounds: Normal breath sounds. No wheezing.  Abdominal:     General: Bowel sounds are normal.     Palpations: Abdomen is soft.     Tenderness: There is no abdominal tenderness.  Musculoskeletal:  General: No swelling or tenderness.     Cervical back: Neck supple. No tenderness.  Lymphadenopathy:     Cervical: No cervical adenopathy.  Skin:    Findings: No erythema or rash.  Neurological:     Mental Status: She is alert.  Psychiatric:        Mood and Affect: Mood normal.        Behavior: Behavior normal.         Outpatient Encounter Medications as of 02/19/2023  Medication Sig   atorvastatin (LIPITOR) 20 MG tablet TAKE 1 TABLET BY MOUTH ONCE  DAILY   cholecalciferol (VITAMIN D3) 25 MCG (1000 UT) tablet Take 1,000 Units by mouth daily. Taking 2,000mg  daily   ELIQUIS 5 MG TABS tablet TAKE 1 TABLET BY MOUTH TWICE  DAILY   hydrochlorothiazide (HYDRODIURIL) 25 MG tablet TAKE 1 TABLET BY MOUTH DAILY   levothyroxine (SYNTHROID) 50 MCG tablet Take 1 tablet (50 mcg total) by mouth daily.   metoprolol tartrate (LOPRESSOR) 25 MG tablet Take 1 tablet (25 mg total) by mouth 2 (two) times daily.   potassium chloride (KLOR-CON) 10 MEQ tablet Take 2 tablets (20 mEq total) by mouth daily. TAKE 1 TABLET BY MOUTH  TWICE DAILY   sertraline (ZOLOFT) 50 MG tablet TAKE 1 TABLET BY MOUTH DAILY   No facility-administered encounter medications on file as of 02/19/2023.     Lab Results  Component Value Date   WBC 7.4 01/13/2023   HGB 14.3 01/13/2023   HCT 42.3 01/13/2023   PLT 252.0 01/13/2023   GLUCOSE 109 (H) 02/19/2023   CHOL 176 02/19/2023   TRIG 185.0 (H) 02/19/2023   HDL 44.20 02/19/2023   LDLDIRECT 97.0 11/01/2018   LDLCALC 95 02/19/2023   ALT 9 02/19/2023   AST 11 02/19/2023   NA 144 02/19/2023   K 4.3 02/19/2023   CL 103 02/19/2023   CREATININE 0.74 02/19/2023    BUN 15 02/19/2023   CO2 30 02/19/2023   TSH 2.04 02/19/2023   INR 1.3 (H) 12/19/2022   HGBA1C 6.5 02/19/2023   MICROALBUR <0.7 06/11/2015       Assessment & Plan:  Hyperlipidemia, unspecified hyperlipidemia type Assessment & Plan: The 10-year ASCVD risk score (Arnett DK, et al., 2019) is: 15.6%   Values used to calculate the score:     Age: 17 years     Sex: Female     Is Non-Hispanic African American: No     Diabetic: No     Tobacco smoker: No     Systolic Blood Pressure: 122 mmHg     Is BP treated: Yes     HDL Cholesterol: 44.2 mg/dL     Total Cholesterol: 176 mg/dL  Low cholesterol diet and exercise. Lipitor. Follow lipid panel   Orders: -     Hepatic function panel -     Lipid panel  Hypothyroidism, unspecified type Assessment & Plan: On thyroid replacement.  Follow tsh.   Orders: -     TSH  Hyperglycemia Assessment & Plan: Low carb diet and exercise.  Follow met b and a1c.   Orders: -     Hemoglobin A1c  Primary hypertension Assessment & Plan: Blood pressure as outlined.  On hctz.  Started on metoprolol in hospital. Follow pressures.  Follow metabolic panel. Continue metoprolol.   Orders: -     Basic metabolic panel  Permanent atrial fibrillation (HCC) Assessment & Plan: Continue eliquis and metoprolol.    B12 deficiency Assessment & Plan: Felt better after  starting B12 injections. Was questioning if some of her symptoms were related to B12. Request recheck B12 level today.   Orders: -     Vitamin B12  Vitamin D deficiency Assessment & Plan: Check vitamin D level.   Orders: -     VITAMIN D 25 Hydroxy (Vit-D Deficiency, Fractures)  Pancreatic cyst Assessment & Plan: Scheduled f/u MRI abdomen and MRCP - f/u pancreatic cyst. F/u MRI - no change in cyst.  Recommended 2 year f/u MRI.  Last MRI 05/2022.    Acquired thrombophilia (HCC) Assessment & Plan: With afib.  On eliquis.  Follow.       Dale [place], MD

## 2023-02-20 LAB — BASIC METABOLIC PANEL
BUN: 15 mg/dL (ref 6–23)
CO2: 30 meq/L (ref 19–32)
Calcium: 9.7 mg/dL (ref 8.4–10.5)
Chloride: 103 meq/L (ref 96–112)
Creatinine, Ser: 0.74 mg/dL (ref 0.40–1.20)
GFR: 80.27 mL/min (ref >60.00)
Glucose, Bld: 109 mg/dL — ABNORMAL HIGH (ref 70–99)
Potassium: 4.3 meq/L (ref 3.5–5.1)
Sodium: 144 meq/L (ref 135–145)

## 2023-02-20 LAB — VITAMIN D 25 HYDROXY (VIT D DEFICIENCY, FRACTURES): VITD: 41.77 ng/mL (ref 30.00–100.00)

## 2023-02-20 LAB — HEPATIC FUNCTION PANEL
ALT: 9 U/L (ref 0–35)
AST: 11 U/L (ref 0–37)
Albumin: 4.3 g/dL (ref 3.5–5.2)
Alkaline Phosphatase: 66 U/L (ref 39–117)
Bilirubin, Direct: 0.2 mg/dL (ref 0.0–0.3)
Total Bilirubin: 1.2 mg/dL (ref 0.2–1.2)
Total Protein: 6.8 g/dL (ref 6.0–8.3)

## 2023-02-20 LAB — TSH: TSH: 2.04 u[IU]/mL (ref 0.35–5.50)

## 2023-02-20 LAB — LIPID PANEL
Cholesterol: 176 mg/dL (ref 0–200)
HDL: 44.2 mg/dL (ref >39.00)
LDL Cholesterol: 95 mg/dL (ref 0–99)
NonHDL: 132.11
Total CHOL/HDL Ratio: 4
Triglycerides: 185 mg/dL — ABNORMAL HIGH (ref 0.0–149.0)
VLDL: 37 mg/dL (ref 0.0–40.0)

## 2023-02-20 LAB — HEMOGLOBIN A1C: Hgb A1c MFr Bld: 6.5 % (ref 4.6–6.5)

## 2023-02-20 LAB — VITAMIN B12: Vitamin B-12: 354 pg/mL (ref 211–911)

## 2023-02-22 ENCOUNTER — Encounter: Payer: Self-pay | Admitting: Internal Medicine

## 2023-02-22 NOTE — Assessment & Plan Note (Signed)
Blood pressure as outlined.  On hctz.  Started on metoprolol in hospital. Follow pressures.  Follow metabolic panel. Continue metoprolol.

## 2023-02-22 NOTE — Assessment & Plan Note (Signed)
The 10-year ASCVD risk score (Arnett DK, et al., 2019) is: 15.6%   Values used to calculate the score:     Age: 73 years     Sex: Female     Is Non-Hispanic African American: No     Diabetic: No     Tobacco smoker: No     Systolic Blood Pressure: 122 mmHg     Is BP treated: Yes     HDL Cholesterol: 44.2 mg/dL     Total Cholesterol: 176 mg/dL  Low cholesterol diet and exercise. Lipitor. Follow lipid panel

## 2023-02-22 NOTE — Assessment & Plan Note (Signed)
Felt better after starting B12 injections. Was questioning if some of her symptoms were related to B12. Request recheck B12 level today.

## 2023-02-22 NOTE — Assessment & Plan Note (Signed)
On thyroid replacement.  Follow tsh.  

## 2023-02-22 NOTE — Assessment & Plan Note (Signed)
Low carb diet and exercise.  Follow met b and a1c.   

## 2023-02-22 NOTE — Assessment & Plan Note (Signed)
With afib.  On eliquis.  Follow.

## 2023-02-22 NOTE — Assessment & Plan Note (Signed)
Check vitamin D level 

## 2023-02-22 NOTE — Assessment & Plan Note (Signed)
Scheduled f/u MRI abdomen and MRCP - f/u pancreatic cyst. F/u MRI - no change in cyst.  Recommended 2 year f/u MRI.  Last MRI 05/2022.

## 2023-02-22 NOTE — Assessment & Plan Note (Signed)
Continue eliquis and metoprolol. 

## 2023-02-23 ENCOUNTER — Ambulatory Visit: Payer: Self-pay | Admitting: Internal Medicine

## 2023-02-23 ENCOUNTER — Ambulatory Visit (INDEPENDENT_AMBULATORY_CARE_PROVIDER_SITE_OTHER): Payer: Medicare Other | Admitting: Nurse Practitioner

## 2023-02-23 ENCOUNTER — Encounter: Payer: Self-pay | Admitting: Nurse Practitioner

## 2023-02-23 ENCOUNTER — Telehealth: Payer: Self-pay | Admitting: Internal Medicine

## 2023-02-23 VITALS — BP 138/70 | HR 93 | Temp 98.1°F | Ht 63.0 in | Wt 228.6 lb

## 2023-02-23 DIAGNOSIS — J0140 Acute pansinusitis, unspecified: Secondary | ICD-10-CM | POA: Insufficient documentation

## 2023-02-23 MED ORDER — PROMETHAZINE-DM 6.25-15 MG/5ML PO SYRP: 5.0000 mL | ORAL_SOLUTION | Freq: Four times a day (QID) | ORAL | 0 refills | Status: DC | PRN

## 2023-02-23 MED ORDER — AMOXICILLIN-POT CLAVULANATE 875-125 MG PO TABS: 1.0000 | ORAL_TABLET | Freq: Two times a day (BID) | ORAL | 0 refills | Status: DC

## 2023-02-23 NOTE — Telephone Encounter (Signed)
Patient seeing Arville Lime now.

## 2023-02-23 NOTE — Assessment & Plan Note (Signed)
Persistent cough, sinus pressure, and postnasal drainage have been present for about two weeks without fever, body aches, or shortness of breath. Over-the-counter treatments, including Robitussin DM, Nasacort, and normal saline nasal spray, have not provided relief. We will start Augmentin 875mg  twice daily for 10 days. Discontinue Robitussin DM as Phenergan-DM will be provided. Continue using Nasacort and normal saline nasal spray as needed. Increase fluid intake and consider probiotics or yogurt during and after the antibiotic course to prevent diarrhea. Return if symptoms persist or worsen.

## 2023-02-23 NOTE — Telephone Encounter (Signed)
Pt returned call for lab results. This RN advised pt of lab results/notes per provider in chart. Pt verbalized understanding, agrees to plan. No additional questions or concerns.

## 2023-02-23 NOTE — Telephone Encounter (Signed)
Copied from CRM [id number]. Topic: Clinical - Medical Advice >> Feb 23, 2023  9:16 AM Josefa Half C wrote: Reason for CRM: Patient was seen in the office on 02/19/2023, the provider informed the patient to notify her if she did not start feeling better. Patient called to inform the provider she is feeling worse and the mucus she is coughing up and blowing out is thicker and it is a greenish brownish color. Patient has stated she is having sinus infection like symptoms and has requested a call back from the provider. Please follow up with patient [phone]  Triage Nurse Attempted to call pt at requested number #[phone] twice but each time received message, "You have reached a number that has been disconnected or is no longer in service." Called pt's spouse/emergency contact reporting that [phone] number seemed out of service, requesting call back from pt. Placing in call back.

## 2023-02-23 NOTE — Telephone Encounter (Signed)
Noted  

## 2023-02-23 NOTE — Telephone Encounter (Signed)
  Chief Complaint: Triage  Additional Notes: Call placed to patient and emergency contact. No answer. Left voicemail with emergency contact. Patient's number is missing/invalid. Callback queued.

## 2023-02-23 NOTE — Progress Notes (Signed)
Bethanie Dicker, NP-C Phone: [phone]  Bonnie Shaw is a 73 y.o. female who presents today for cough and congestion.   Discussed the use of AI scribe software for clinical note transcription with the patient, who gave verbal consent to proceed.  History of Present Illness   The patient, with a history of hypertension, A-fib and hypothyroid, presents with a persistent cough and sinus congestion for approximately two weeks. They report that the cough is worse in the evenings, often disrupting sleep. This morning, they noticed thick mucus expectoration for the first time. They also report a headache and facial pressure, particularly under the eyes and around the nose. The patient denies chest congestion, shortness of breath, fevers, body aches, and fatigue. They have been managing symptoms with over-the-counter Robitussin DM, Nasacort, and normal saline nasal spray.  The patient was seen by her PCP last week, at which time they were advised to continue the current regimen and return if symptoms did not improve. The patient denies any known exposure to COVID or flu, but notes that their large family has been experiencing a common cold. They report slight left ear pain but deny significant discomfort.  The patient has a history of anxiety, managed with Zoloft, and reports a significant improvement in their anxiety symptoms over the years. They also have hypertension, managed with hydrochlorothiazide and Lopressor. The patient is allergic to Bactrim or sulfa antibiotics.      Social History   Tobacco Use  Smoking Status Never  Smokeless Tobacco Never    Current Outpatient Medications on File Prior to Visit  Medication Sig Dispense Refill   atorvastatin (LIPITOR) 20 MG tablet TAKE 1 TABLET BY MOUTH ONCE  DAILY 90 tablet 3   cholecalciferol (VITAMIN D3) 25 MCG (1000 UT) tablet Take 1,000 Units by mouth daily. Taking 2,000mg  daily     ELIQUIS 5 MG TABS tablet TAKE 1 TABLET BY MOUTH TWICE  DAILY  180 tablet 3   hydrochlorothiazide (HYDRODIURIL) 25 MG tablet TAKE 1 TABLET BY MOUTH DAILY 90 tablet 3   levothyroxine (SYNTHROID) 50 MCG tablet Take 1 tablet (50 mcg total) by mouth daily. 90 tablet 3   metoprolol tartrate (LOPRESSOR) 25 MG tablet Take 1 tablet (25 mg total) by mouth 2 (two) times daily. 60 tablet 1   potassium chloride (KLOR-CON) 10 MEQ tablet Take 2 tablets (20 mEq total) by mouth daily. TAKE 1 TABLET BY MOUTH  TWICE DAILY 60 tablet 0   sertraline (ZOLOFT) 50 MG tablet TAKE 1 TABLET BY MOUTH DAILY 90 tablet 3   No current facility-administered medications on file prior to visit.     ROS see history of present illness  Objective  Physical Exam Vitals:   02/23/23 1402  BP: 138/70  Pulse: 93  Temp: 98.1 F (36.7 C)  SpO2: 96%    BP Readings from Last 3 Encounters:  02/23/23 138/70  02/19/23 122/70  01/13/23 114/74   Wt Readings from Last 3 Encounters:  02/23/23 228 lb 9.6 oz (103.7 kg)  02/19/23 228 lb (103.4 kg)  01/13/23 228 lb 6.4 oz (103.6 kg)    Physical Exam Constitutional:      General: She is not in acute distress.    Appearance: Normal appearance.  HENT:     Head: Normocephalic.     Right Ear: Tympanic membrane normal.     Left Ear: Tympanic membrane normal.     Nose: Congestion present.     Right Sinus: Maxillary sinus tenderness present.  Left Sinus: Maxillary sinus tenderness present.     Mouth/Throat:     Mouth: Mucous membranes are moist.     Pharynx: Oropharynx is clear.  Eyes:     Conjunctiva/sclera: Conjunctivae normal.     Pupils: Pupils are equal, round, and reactive to light.  Cardiovascular:     Rate and Rhythm: Normal rate and regular rhythm.     Heart sounds: Normal heart sounds.  Pulmonary:     Effort: Pulmonary effort is normal.     Breath sounds: Normal breath sounds.  Lymphadenopathy:     Cervical: No cervical adenopathy.  Skin:    General: Skin is warm and dry.  Neurological:     General: No focal deficit  present.     Mental Status: She is alert.  Psychiatric:        Mood and Affect: Mood normal.        Behavior: Behavior normal.    Assessment/Plan: Please see individual problem list.  Acute non-recurrent pansinusitis Assessment & Plan: Persistent cough, sinus pressure, and postnasal drainage have been present for about two weeks without fever, body aches, or shortness of breath. Over-the-counter treatments, including Robitussin DM, Nasacort, and normal saline nasal spray, have not provided relief. We will start Augmentin 875mg  twice daily for 10 days. Discontinue Robitussin DM as Phenergan-DM will be provided. Continue using Nasacort and normal saline nasal spray as needed. Increase fluid intake and consider probiotics or yogurt during and after the antibiotic course to prevent diarrhea. Return if symptoms persist or worsen.   Orders: -     Amoxicillin-Pot Clavulanate; Take 1 tablet by mouth 2 (two) times daily.  Dispense: 20 tablet; Refill: 0 -     Promethazine-DM; Take 5 mLs by mouth every 6 (six) hours as needed for cough.  Dispense: 118 mL; Refill: 0   Return if symptoms worsen or fail to improve.   Bethanie Dicker, NP-C [facility]

## 2023-02-23 NOTE — Telephone Encounter (Signed)
  Chief Complaint: sinus pressure, productive cough Symptoms: productive cough, sinus pressure, L ear pain Frequency: 1 week, approximate Pertinent Negatives: Patient denies fever, nausea, vomiting diarrhea Disposition: [] ED /[] Urgent Care (no appt availability in office) / [x] Appointment(In office/virtual)/ []  [facility]/ [] Home Care/ [] Refused Recommended Disposition /[] [facility]/ []  Follow-up with PCP Additional Notes: Patient called reporting sinus pressure, productive cough with dark green brown phlegm, L ear pain x approx 1 week. ... States she was seen in office for another concern on 1/16 and given OTC care advice, but symptoms are not improving. Denies fever, nausea, vomiting, diarrhea. Per protocol, patient to be evaluated within 24 hours. Patient scheduled for today at 1400 with next available provider at patient request. Care advice reviewed with patient, understanding verbalized. Denies further questions at this time. Alerting PCP for review.   Reason for Disposition  Earache  Answer Assessment - Initial Assessment Questions 1. LOCATION: "Where does it hurt?"      Headache, sinus pressure around 2. ONSET: "When did the sinus pain start?"  (e.g., hours, days)      1 week 3. SEVERITY: "How bad is the pain?"   (Scale 1-10; mild, moderate or severe)   - MILD (1-3): doesn't interfere with normal activities    - MODERATE (4-7): interferes with normal activities (e.g., work or school) or awakens from sleep   - SEVERE (8-10): excruciating pain and patient unable to do any normal activities        3/10 4. RECURRENT SYMPTOM: "Have you ever had sinus problems before?" If Yes, ask: "When was the last time?" and "What happened that time?"      History of sinus infections- within last year 5. NASAL CONGESTION: "Is the nose blocked?" If Yes, ask: "Can you open it or must you breathe through your mouth?"     Yes, breathing through mouth 6. NASAL DISCHARGE: "Do you  have discharge from your nose?" If so ask, "What color?"     Clear, thinner 7. FEVER: "Do you have a fever?" If Yes, ask: "What is it, how was it measured, and when did it start?"      Denies 8. OTHER SYMPTOMS: "Do you have any other symptoms?" (e.g., sore throat, cough, earache, difficulty breathing)     Coughing globs of thick green brown mucus, states it is very dark.  Protocols used: Sinus Pain or Congestion-A-AH

## 2023-03-09 ENCOUNTER — Telehealth: Payer: Self-pay | Admitting: Internal Medicine

## 2023-03-09 NOTE — Telephone Encounter (Signed)
Received notification she is overdue a mammogram.  Need to schedule if agreeable.   Dr Lorin Picket

## 2023-03-11 ENCOUNTER — Other Ambulatory Visit: Payer: Self-pay | Admitting: Internal Medicine

## 2023-03-11 DIAGNOSIS — Z1231 Encounter for screening mammogram for malignant neoplasm of breast: Secondary | ICD-10-CM

## 2023-03-18 ENCOUNTER — Other Ambulatory Visit: Payer: Self-pay | Admitting: Internal Medicine

## 2023-03-23 ENCOUNTER — Other Ambulatory Visit: Payer: Self-pay | Admitting: Internal Medicine

## 2023-03-26 ENCOUNTER — Other Ambulatory Visit: Payer: Self-pay | Admitting: Internal Medicine

## 2023-03-26 NOTE — Telephone Encounter (Signed)
Patient called about her metoprolol tartrate (LOPRESSOR) 25 MG tablet, please call patient.

## 2023-03-26 NOTE — Telephone Encounter (Signed)
Copied from CRM [id number]. Topic: Clinical - Medication Refill >> Mar 26, 2023 12:43 PM Truddie Crumble wrote: Most Recent Primary Care Visit:  Provider: Bethanie Dicker  Department: [facility]  Visit Type: ACUTE  Date: 02/23/2023  Medication: metoprolol tartrate  Has the patient contacted their pharmacy? Yes (Agent: If no, request that the patient contact the pharmacy for the refill. If patient does not wish to contact the pharmacy document the reason why and proceed with request.) (Agent: If yes, when and what did the pharmacy advise?)  Is this the correct pharmacy for this prescription? Yes If no, delete pharmacy and type the correct one.  This is the patient's preferred pharmacy:   [id number] [place], [place] - [address] AT [facility] [address] [place] [address] Phone: [phone] Fax: [phone]  Has the prescription been filled recently? No  Is the patient out of the medication? Yes  Has the patient been seen for an appointment in the last year OR does the patient have an upcoming appointment? Yes  Can we respond through MyChart? Yes  Agent: Please be advised that Rx refills may take up to 3 business days. We ask that you follow-up with your pharmacy.

## 2023-03-27 ENCOUNTER — Telehealth: Payer: Self-pay | Admitting: Internal Medicine

## 2023-03-27 ENCOUNTER — Telehealth: Payer: Self-pay

## 2023-03-27 ENCOUNTER — Other Ambulatory Visit: Payer: Self-pay | Admitting: Internal Medicine

## 2023-03-27 NOTE — Telephone Encounter (Signed)
Called patient to see what is needed. Patient stated her husband is on the way to pick up her prescription. Nothing further needed.

## 2023-03-27 NOTE — Telephone Encounter (Signed)
 See other note,

## 2023-03-27 NOTE — Telephone Encounter (Signed)
Copied from CRM [id number]. Topic: Clinical - Prescription Issue >> Mar 27, 2023  9:52 AM Theodis Sato wrote: Reason for CRM: Patient states she has been waiting for her metoprolol tartrate (LOPRESSOR) 25 MG tablet to be filled since 2/12 and has been advised this medication has been sent multiple times but pharmacy states they don't have the order. Patient states she is frustrated and that she is out of this medication.

## 2023-03-27 NOTE — Telephone Encounter (Signed)
Copied from CRM [id number]. Topic: Clinical - Prescription Issue >> Mar 27, 2023  9:37 AM Corin V wrote: Reason for CRM: Patient called to check on her metoprolol tartrate (LOPRESSOR) 25 MG tablet refill request. Agent advised that it was sent to the pharmacy on 03/18/23. She states the pharmacy never received this. Agent stated that there were 2 messages in this week already regarding the medication still needing to be refilled. Agent stated they would call the clinic to ask about the delay and patient stated she had been told that already and wanted to speak directly to someone in the clinic.  Agent called [facility] and was told Dr. Roby Lofts nurse was in with a patient and to send a CRM. Call dropped at this point. Agent attempted to call patient back and was unable to get a hold of patient. Please call patient back about this refill issue

## 2023-03-27 NOTE — Telephone Encounter (Signed)
Rx already addressed for metoprolol.

## 2023-03-27 NOTE — Telephone Encounter (Signed)
E2C2 called stating pt is very adamant wanting to speak Dr. Lorin Picket nurse . Please advise

## 2023-04-06 ENCOUNTER — Telehealth: Payer: Self-pay | Admitting: Internal Medicine

## 2023-04-06 NOTE — Telephone Encounter (Signed)
 Patient need lab orders.

## 2023-04-08 NOTE — Telephone Encounter (Signed)
 Ok to hold on labs since had labs in 02/2023

## 2023-04-08 NOTE — Telephone Encounter (Signed)
 Lab appointment cancelled. Patient is aware.

## 2023-04-08 NOTE — Telephone Encounter (Signed)
 She had fasting labs done 1/16. Does she need the lab appt on 3/7?

## 2023-04-10 ENCOUNTER — Other Ambulatory Visit: Payer: Medicare Other

## 2023-04-14 ENCOUNTER — Ambulatory Visit (INDEPENDENT_AMBULATORY_CARE_PROVIDER_SITE_OTHER): Payer: Medicare Other | Admitting: Internal Medicine

## 2023-04-14 ENCOUNTER — Ambulatory Visit
Admission: RE | Admit: 2023-04-14 | Discharge: 2023-04-14 | Disposition: A | Discharge status: Home / Self Care | Payer: Medicare Other | Source: Ambulatory Visit | Attending: Internal Medicine | Admitting: Internal Medicine

## 2023-04-14 ENCOUNTER — Ambulatory Visit (INDEPENDENT_AMBULATORY_CARE_PROVIDER_SITE_OTHER)

## 2023-04-14 VITALS — BP 124/74 | HR 90 | Temp 98.0°F | Resp 16 | Ht 63.0 in | Wt 231.8 lb

## 2023-04-14 DIAGNOSIS — K862 Cyst of pancreas: Secondary | ICD-10-CM

## 2023-04-14 DIAGNOSIS — F419 Anxiety disorder, unspecified: Secondary | ICD-10-CM

## 2023-04-14 DIAGNOSIS — Z1231 Encounter for screening mammogram for malignant neoplasm of breast: Secondary | ICD-10-CM | POA: Diagnosis present

## 2023-04-14 DIAGNOSIS — E785 Hyperlipidemia, unspecified: Secondary | ICD-10-CM | POA: Diagnosis not present

## 2023-04-14 DIAGNOSIS — R739 Hyperglycemia, unspecified: Secondary | ICD-10-CM

## 2023-04-14 DIAGNOSIS — D6869 Other thrombophilia: Secondary | ICD-10-CM

## 2023-04-14 DIAGNOSIS — Z8669 Personal history of other diseases of the nervous system and sense organs: Secondary | ICD-10-CM

## 2023-04-14 DIAGNOSIS — E039 Hypothyroidism, unspecified: Secondary | ICD-10-CM

## 2023-04-14 DIAGNOSIS — D649 Anemia, unspecified: Secondary | ICD-10-CM

## 2023-04-14 DIAGNOSIS — I10 Essential (primary) hypertension: Secondary | ICD-10-CM | POA: Diagnosis not present

## 2023-04-14 DIAGNOSIS — I4821 Permanent atrial fibrillation: Secondary | ICD-10-CM

## 2023-04-14 DIAGNOSIS — R053 Chronic cough: Secondary | ICD-10-CM

## 2023-04-14 MED ORDER — METOPROLOL TARTRATE 25 MG PO TABS: 25.0000 mg | ORAL_TABLET | Freq: Two times a day (BID) | ORAL | 3 refills | Status: AC

## 2023-04-14 NOTE — Progress Notes (Signed)
 Subjective:    Patient ID: Bonnie Shaw, female    DOB: 01/08/1951, 73 y.o.   MRN: [id number]  Patient here for  Chief Complaint  Patient presents with   Medical Management of Chronic Issues    HPI Here for a scheduled follow up - follow up regarding afib, hypertension and hypercholesterolemia. Reports persistent intermittent cough and still with intermittent coughing fits. Was admitted 12/2022 with weakness. Concern initially about possible pneumonia. CXR - clear. Reevaluated 02/23/23 - cough and congestion. Treated with augmentin. No acid reflux. Persistent issue for her for the last few months now. Discussed f/u cxr. Also reports increased sensitivity - mouth. Persistent. Treatment for thrush recently. Mouth is better. Still a strange taste and some sensitivity. Also has noticed some increased discomfort - left upper post - thigh - stretching aggravates. Working with PT. No chest pain reported.    Past Medical History:  Diagnosis Date   Anxiety    Arthritis    Cataract    Complication of anesthesia    Woke up during colonoscopy and during Knee surgery. 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   Hyperlipidemia    Hypertension    Hyperthyroidism    s/p RAI therapy   Hypothyroidism    s/p ablation   Left atrial enlargement    severely enlarged   Migraine headache    2x/yr   Motion sickness    roller coasters   Obesity    Panic disorder    Persistent atrial fibrillation (HCC)    Snoring    Past Surgical History:  Procedure Laterality Date   ABDOMINAL HYSTERECTOMY     CARDIOVERSION N/A 09/11/2017   Procedure: CARDIOVERSION;  Surgeon: Chilton Si, MD;  Location: [facility] ENDOSCOPY;  Service: Cardiovascular;  Laterality: N/A;   CATARACT EXTRACTION     COLONOSCOPY WITH PROPOFOL N/A 03/11/2019   Procedure: COLONOSCOPY WITH PROPOFOL;  Surgeon: Wyline Mood, MD;   Location: [facility];  Service: Endoscopy;  Laterality: N/A;  colon   COLONOSCOPY WITH PROPOFOL N/A 07/30/2022   Procedure: COLONOSCOPY WITH PROPOFOL;  Surgeon: Toledo, Boykin Nearing, MD;  Location: [facility] ENDOSCOPY;  Service: Gastroenterology;  Laterality: N/A;   KNEE SURGERY Bilateral    replacements   POLYPECTOMY  03/11/2019   Procedure: POLYPECTOMY;  Surgeon: Wyline Mood, MD;  Location: [facility];  Service: Endoscopy;;   THYROID ABLATION  2010   Family History  Problem Relation Age of Onset   Alzheimer's disease Father    Stroke Mother    Cancer Sister        ovarian    Ovarian cancer Sister 12   Breast cancer Neg Hx    Social History   Socioeconomic History   Marital status: Married    Spouse name: Not on file   Number of children: 4   Years of education: Not on file   Highest education level: Associate degree: occupational, Scientist, product/process development, or vocational program  Occupational History    Employer: UNEMPLOYED  Tobacco Use   Smoking status: Never   Smokeless tobacco: Never  Vaping Use   Vaping status: Never Used  Substance and Sexual Activity   Alcohol use: No   Drug use: No   Sexual activity: Not on file  Other Topics Concern   Not on file  Social History Narrative   Lives in [place] with husband, has 4  children and 15 grandkids         Attends [employer], 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[employer]   Retired [employer]   Social Drivers of [employer]: Low Risk  (02/18/2023)   Overall Financial Resource Strain (CARDIA)    Difficulty of Paying Living Expenses: Not hard at all  Food Insecurity: No Food Insecurity (02/18/2023)   Hunger Vital Sign    Worried About Running Out of Food in the Last Year: Never true    Ran Out of Food in the Last Year: Never true  Transportation Needs: No Transportation Needs (02/18/2023)   PRAPARE - Administrator, Civil Service (Medical): No    Lack of  Transportation (Non-Medical): No  Physical Activity: Sufficiently Active (02/18/2023)   Exercise Vital Sign    Days of Exercise per Week: 6 days    Minutes of Exercise per Session: 30 min  Stress: No Stress Concern Present (02/18/2023)   [employer] of Occupational Health - Occupational Stress Questionnaire    Feeling of Stress : Not at all  Social Connections: Socially Integrated (02/18/2023)   Social Connection and Isolation Panel [NHANES]    Frequency of Communication with Friends and Family: More than three times a week    Frequency of Social Gatherings with Friends and Family: More than three times a week    Attends Religious Services: More than 4 times per year    Active Member of [employer] or Organizations: Yes    Attends Engineer, structural: More than 4 times per year    Marital Status: Married     Review of Systems  Constitutional:  Negative for appetite change, fever and unexpected weight change.  HENT:  Negative for congestion and sinus pressure.   Respiratory:  Positive for cough. Negative for chest tightness and shortness of breath.   Cardiovascular:  Negative for chest pain, palpitations and leg swelling.  Gastrointestinal:  Negative for abdominal pain, nausea and vomiting.       No acid reflux reported.   Genitourinary:  Negative for difficulty urinating and dysuria.  Musculoskeletal:  Negative for joint swelling and myalgias.  Skin:  Negative for color change and rash.  Neurological:  Negative for dizziness and headaches.  Psychiatric/Behavioral:  Negative for agitation and dysphoric mood.        Objective:     BP 124/74   Pulse 90   Temp 98 F (36.7 C)   Resp 16   Ht 5\' 3"  (1.6 m)   Wt 231 lb 12.8 oz (105.1 kg)   SpO2 97%   BMI 41.06 kg/m  Wt Readings from Last 3 Encounters:  04/14/23 231 lb 12.8 oz (105.1 kg)  02/23/23 228 lb 9.6 oz (103.7 kg)  02/19/23 228 lb (103.4 kg)    Physical Exam Vitals reviewed.  Constitutional:      General:  She is not in acute distress.    Appearance: Normal appearance.  HENT:     Head: Normocephalic and atraumatic.     Right Ear: External ear normal.     Left Ear: External ear normal.     Mouth/Throat:     Pharynx: No oropharyngeal exudate or posterior oropharyngeal erythema.  Eyes:     General: No scleral icterus.       Right eye: No discharge.        Left eye: No discharge.     Conjunctiva/sclera: Conjunctivae normal.  Neck:     Thyroid: No thyromegaly.  Cardiovascular:     Rate and Rhythm: Normal rate and regular rhythm.  Pulmonary:     Effort: No respiratory distress.     Breath sounds: Normal breath sounds. No wheezing.  Abdominal:     General: Bowel sounds are normal.     Palpations: Abdomen is soft.     Tenderness: There is no abdominal tenderness.  Musculoskeletal:        General: No swelling or tenderness.     Cervical back: Neck supple. No tenderness.  Lymphadenopathy:     Cervical: No cervical adenopathy.  Skin:    Findings: No erythema or rash.  Neurological:     Mental Status: She is alert.  Psychiatric:        Mood and Affect: Mood normal.        Behavior: Behavior normal.         Outpatient Encounter Medications as of 04/14/2023  Medication Sig   atorvastatin (LIPITOR) 20 MG tablet TAKE 1 TABLET BY MOUTH ONCE  DAILY   cholecalciferol (VITAMIN D3) 25 MCG (1000 UT) tablet Take 1,000 Units by mouth daily. Taking 2,000mg  daily   ELIQUIS 5 MG TABS tablet TAKE 1 TABLET BY MOUTH TWICE  DAILY   hydrochlorothiazide (HYDRODIURIL) 25 MG tablet TAKE 1 TABLET BY MOUTH DAILY   levothyroxine (SYNTHROID) 50 MCG tablet Take 1 tablet (50 mcg total) by mouth daily.   metoprolol tartrate (LOPRESSOR) 25 MG tablet Take 1 tablet (25 mg total) by mouth 2 (two) times daily.   potassium chloride (KLOR-CON) 10 MEQ tablet Take 2 tablets (20 mEq total) by mouth daily. TAKE 1 TABLET BY MOUTH  TWICE DAILY   promethazine-dextromethorphan (PROMETHAZINE-DM) 6.25-15 MG/5ML syrup Take 5  mLs by mouth every 6 (six) hours as needed for cough.   sertraline (ZOLOFT) 50 MG tablet TAKE 1 TABLET BY MOUTH DAILY   [DISCONTINUED] amoxicillin-clavulanate (AUGMENTIN) 875-125 MG tablet Take 1 tablet by mouth 2 (two) times daily.   [DISCONTINUED] metoprolol tartrate (LOPRESSOR) 25 MG tablet TAKE 1 TABLET(25 MG) BY MOUTH TWICE DAILY   No facility-administered encounter medications on file as of 04/14/2023.     Lab Results  Component Value Date   WBC 7.4 01/13/2023   HGB 14.3 01/13/2023   HCT 42.3 01/13/2023   PLT 252.0 01/13/2023   GLUCOSE 109 (H) 02/19/2023   CHOL 176 02/19/2023   TRIG 185.0 (H) 02/19/2023   HDL 44.20 02/19/2023   LDLDIRECT 97.0 11/01/2018   LDLCALC 95 02/19/2023   ALT 9 02/19/2023   AST 11 02/19/2023   NA 144 02/19/2023   K 4.3 02/19/2023   CL 103 02/19/2023   CREATININE 0.74 02/19/2023   BUN 15 02/19/2023   CO2 30 02/19/2023   TSH 2.04 02/19/2023   INR 1.3 (H) 12/19/2022   HGBA1C 6.5 02/19/2023   MICROALBUR <0.7 06/11/2015       Assessment & Plan:  Persistent cough Assessment & Plan: Persistent intermittent cough as outlined. No wheezing on exam. No increased acid reflux. Given persistent cough, check cxr.   Orders: -     DG Chest 2 View; Future  Hyperlipidemia, unspecified hyperlipidemia type Assessment & Plan: The 10-year ASCVD risk score (Arnett DK, et al., 2019) is: 16.1%   Values used to calculate the score:     Age: 46 years     Sex: Female     Is Non-Hispanic African American: No     Diabetic: No     Tobacco  smoker: No     Systolic Blood Pressure: 124 mmHg     Is BP treated: Yes     HDL Cholesterol: 44.2 mg/dL     Total Cholesterol: 176 mg/dL  Low cholesterol diet and exercise. Taking lipitor. Follow lipid panel.   Orders: -     Lipid panel; Future -     Hepatic function panel; Future  Hyperglycemia Assessment & Plan: Low carb diet and exercise. Follow met b and A1c.   Orders: -     Hemoglobin A1c; Future  Primary  hypertension Assessment & Plan: Blood pressure as outlined.  On hydrochlorothiazide and metoprolol. Follow pressures. Follow metabolic panel.   Orders: -     Basic metabolic panel; Future  Acquired thrombophilia (HCC) Assessment & Plan: Afib. On eliquis.    Anemia, unspecified type Assessment & Plan: Follow cbc.    Anxiety Assessment & Plan: Continue zoloft. Overall appears to be handling things relatively well. Follow.    Permanent atrial fibrillation (HCC) Assessment & Plan: Continue eliquis and metoprolol.    History of Guillain-Barre syndrome Assessment & Plan: S/p treatment with IVIG. Released by neurology. Stable.    Hypothyroidism, unspecified type Assessment & Plan: On thyroid replacement. Follow tsh.    Pancreatic cyst Assessment & Plan: Scheduled f/u MRI abdomen and MRCP - f/u pancreatic cyst. F/u MRI - no change in cyst.  Recommended 2 year f/u MRI.  Last MRI 05/2022.    Other orders -     Metoprolol Tartrate; Take 1 tablet (25 mg total) by mouth 2 (two) times daily.  Dispense: 180 tablet; Refill: 3     Dale Cliff, MD

## 2023-04-16 ENCOUNTER — Telehealth: Payer: Self-pay

## 2023-04-16 NOTE — Telephone Encounter (Signed)
 Copied from CRM [id number]. Topic: Clinical - Lab/Test Results >> Apr 16, 2023  4:16 PM Elizebeth Brooking wrote: Reason for CRM: Patient called in stating she has received the results of her xray results on mychart would like to know when someone is going her a call regarding the results

## 2023-04-17 NOTE — Telephone Encounter (Signed)
 Spoke to pt and explained to her that as soon as Dr Lorin Picket results xrays someone would reach out to her

## 2023-04-18 ENCOUNTER — Encounter: Payer: Self-pay | Admitting: Internal Medicine

## 2023-04-18 DIAGNOSIS — R9389 Abnormal findings on diagnostic imaging of other specified body structures: Secondary | ICD-10-CM | POA: Insufficient documentation

## 2023-04-19 ENCOUNTER — Encounter: Payer: Self-pay | Admitting: Internal Medicine

## 2023-04-19 DIAGNOSIS — R053 Chronic cough: Secondary | ICD-10-CM | POA: Insufficient documentation

## 2023-04-19 NOTE — Assessment & Plan Note (Signed)
 Follow cbc.

## 2023-04-19 NOTE — Assessment & Plan Note (Signed)
 Blood pressure as outlined.  On hydrochlorothiazide and metoprolol. Follow pressures. Follow metabolic panel.

## 2023-04-19 NOTE — Assessment & Plan Note (Signed)
 Scheduled f/u MRI abdomen and MRCP - f/u pancreatic cyst. F/u MRI - no change in cyst.  Recommended 2 year f/u MRI.  Last MRI 05/2022.

## 2023-04-19 NOTE — Assessment & Plan Note (Signed)
 Continue eliquis and metoprolol

## 2023-04-19 NOTE — Assessment & Plan Note (Signed)
 The 10-year ASCVD risk score (Arnett DK, et al., 2019) is: 16.1%   Values used to calculate the score:     Age: 73 years     Sex: Female     Is Non-Hispanic African American: No     Diabetic: No     Tobacco smoker: No     Systolic Blood Pressure: 124 mmHg     Is BP treated: Yes     HDL Cholesterol: 44.2 mg/dL     Total Cholesterol: 176 mg/dL  Low cholesterol diet and exercise. Taking lipitor. Follow lipid panel.

## 2023-04-19 NOTE — Assessment & Plan Note (Signed)
 Persistent intermittent cough as outlined. No wheezing on exam. No increased acid reflux. Given persistent cough, check cxr.

## 2023-04-19 NOTE — Assessment & Plan Note (Signed)
Afib.  On eliquis.  

## 2023-04-19 NOTE — Assessment & Plan Note (Signed)
 On thyroid replacement.  Follow tsh.

## 2023-04-19 NOTE — Assessment & Plan Note (Signed)
S/p treatment with IVIG.  Released by neurology.  Stable.

## 2023-04-19 NOTE — Assessment & Plan Note (Signed)
Continue zoloft.  Overall appears to be handling things relatively well.  Follow.  

## 2023-04-19 NOTE — Assessment & Plan Note (Signed)
 Low-carb diet and exercise.  Follow met b and A1c.

## 2023-04-21 ENCOUNTER — Encounter: Payer: Self-pay | Admitting: Student in an Organized Health Care Education/Training Program

## 2023-04-21 ENCOUNTER — Ambulatory Visit: Admitting: Student in an Organized Health Care Education/Training Program

## 2023-04-21 VITALS — BP 126/88 | HR 91 | Temp 98.0°F | Ht 63.0 in | Wt 234.0 lb

## 2023-04-21 DIAGNOSIS — Z8616 Personal history of COVID-19: Secondary | ICD-10-CM

## 2023-04-21 DIAGNOSIS — R053 Chronic cough: Secondary | ICD-10-CM

## 2023-04-21 NOTE — Progress Notes (Signed)
 Synopsis: Referred in chronic cough by Dale , MD  Assessment & Plan:   1. Chronic cough (Primary)  Pleasant 73 year old female presenting for the evaluation of chronic cough that has been ongoing since September.  This was initially a postviral cough but seems to have lingered with a differential diagnosis that includes interstitial lung disease (given bronchial wall thickening on chest x-ray), reflux associated cough (report of reflux, hiatal hernia reported on imaging), cough variant asthma, and less likely upper airway cough syndrome. Exam today is unremarkable with clear lungs and no hypoxia which is reassuring.  I will obtain a workup to include a high-resolution chest CT to rule out interstitial lung disease, pulmonary function testing to rule out reactive airway disease, and an esophagogram to assess for reflux disease.  Should all this be negative and reassuring, we will consider initiation of antihistamine therapy for empiric treatment of upper airway cough syndrome should patient's symptoms persist.  - Pulmonary Function Test [facility] Only; Future - CT CHEST HIGH RESOLUTION; Future - DG ESOPHAGUS W DOUBLE CM (HD); Future   Return in about 2 months (around 06/21/2023).  I spent 60 minutes caring for this patient today, including preparing to see the patient, obtaining a medical history , reviewing a separately obtained history, performing a medically appropriate examination and/or evaluation, counseling and educating the patient/family/caregiver, ordering medications, tests, or procedures, referring and communicating with other health care professionals (not separately reported), documenting clinical information in the electronic health record, and independently interpreting results (not separately reported/billed) and communicating results to the patient/family/caregiver  Raechel Chute, MD [facility]  End of visit medications:  No orders of the defined  types were placed in this encounter.    Current Outpatient Medications:    atorvastatin (LIPITOR) 20 MG tablet, TAKE 1 TABLET BY MOUTH ONCE  DAILY, Disp: 90 tablet, Rfl: 3   cholecalciferol (VITAMIN D3) 25 MCG (1000 UT) tablet, Take 1,000 Units by mouth daily. Taking 2,000mg  daily, Disp: , Rfl:    ELIQUIS 5 MG TABS tablet, TAKE 1 TABLET BY MOUTH TWICE  DAILY, Disp: 180 tablet, Rfl: 3   hydrochlorothiazide (HYDRODIURIL) 25 MG tablet, TAKE 1 TABLET BY MOUTH DAILY, Disp: 90 tablet, Rfl: 3   levothyroxine (SYNTHROID) 50 MCG tablet, Take 1 tablet (50 mcg total) by mouth daily., Disp: 90 tablet, Rfl: 3   metoprolol tartrate (LOPRESSOR) 25 MG tablet, Take 1 tablet (25 mg total) by mouth 2 (two) times daily., Disp: 180 tablet, Rfl: 3   potassium chloride (KLOR-CON) 10 MEQ tablet, Take 2 tablets (20 mEq total) by mouth daily. TAKE 1 TABLET BY MOUTH  TWICE DAILY, Disp: 60 tablet, Rfl: 0   promethazine-dextromethorphan (PROMETHAZINE-DM) 6.25-15 MG/5ML syrup, Take 5 mLs by mouth every 6 (six) hours as needed for cough., Disp: 118 mL, Rfl: 0   sertraline (ZOLOFT) 50 MG tablet, TAKE 1 TABLET BY MOUTH DAILY, Disp: 90 tablet, Rfl: 3   Subjective:   PATIENT ID: Bonnie Shaw GENDER: female DOB: 06-28-1950, MRN: [id number]  Chief Complaint  Patient presents with   Consult    Occasional cough.     HPI  Patient is a pleasant 73 year old female presenting to clinic for the evaluation of cough.  She feels that symptoms started around September 2024 after contracting COVID.  She developed a cough that was productive of sputum with associated shortness of breath.  Cough persisted and she was subsequently diagnosed with pneumonia and treated with antibiotics and received a course of steroids.  Symptoms  persisted prompting follow-up with primary care physician where chest x-ray was obtained and noted to be somewhat abnormal with increased bronchial thickening, cardiomegaly, and hiatal hernia.  She has had 3  courses of antibiotics without full resolution in her symptoms.   Patient has not noticed any worsening of cough at night nor associated fevers, chills, night sweats, or weight loss.  She actually feels much better today and feels that the cough is improved over the past few days. She does report a history of reflux but has not noticed any worsening in that regard recently.  Patient denies any smoking or occupational exposures.  She does report some secondhand smoke exposure in her past.   Past medical history is significant for atrial fibrillation, hypothyroidism, hypertension, previous Guillain-Barr syndrome, and anxiety.  Ancillary information including prior medications, full medical/surgical/family/social histories, and PFTs (when available) are listed below and have been reviewed.   Review of Systems  Constitutional:  Negative for chills, fever and weight loss.  Respiratory:  Positive for cough. Negative for hemoptysis, sputum production, shortness of breath and wheezing.   Cardiovascular:  Negative for chest pain and palpitations.     Objective:   Vitals:   04/21/23 1620  BP: 126/88  Pulse: 91  Temp: 98 F (36.7 C)  TempSrc: Temporal  SpO2: 96%  Weight: 234 lb (106.1 kg)  Height: 5\' 3"  (1.6 m)   96% on RA BMI Readings from Last 3 Encounters:  04/21/23 41.45 kg/m  04/14/23 41.06 kg/m  02/23/23 40.49 kg/m   Wt Readings from Last 3 Encounters:  04/21/23 234 lb (106.1 kg)  04/14/23 231 lb 12.8 oz (105.1 kg)  02/23/23 228 lb 9.6 oz (103.7 kg)    Physical Exam Constitutional:      Appearance: Normal appearance. She is not ill-appearing.  Cardiovascular:     Rate and Rhythm: Normal rate and regular rhythm.     Pulses: Normal pulses.     Heart sounds: Normal heart sounds.  Pulmonary:     Effort: Pulmonary effort is normal. No respiratory distress.     Breath sounds: Normal breath sounds. No wheezing or rales.  Abdominal:     Palpations: Abdomen is soft.   Musculoskeletal:     Right lower leg: No edema.     Left lower leg: No edema.  Neurological:     General: No focal deficit present.     Mental Status: She is alert and oriented to person, place, and time. Mental status is at baseline.       Ancillary Information    Past Medical History:  Diagnosis Date   Anxiety    Arthritis    Cataract    Complication of anesthesia    Woke up during colonoscopy and during Knee surgery. 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   Hyperlipidemia    Hypertension    Hyperthyroidism    s/p RAI therapy   Hypothyroidism    s/p ablation   Left atrial enlargement    severely enlarged   Migraine headache    2x/yr   Motion sickness    roller coasters   Obesity    Panic disorder    Persistent atrial fibrillation (HCC)    Snoring      Family History  Problem Relation Age of Onset   Alzheimer's disease Father    Stroke Mother  Cancer Sister        ovarian    Ovarian cancer Sister 6   Breast cancer Neg Hx      Past Surgical History:  Procedure Laterality Date   ABDOMINAL HYSTERECTOMY     CARDIOVERSION N/A 09/11/2017   Procedure: CARDIOVERSION;  Surgeon: Chilton Si, MD;  Location[facility] ENDOSCOPY;  Service: Cardiovascular;  Laterality: N/A;   CATARACT EXTRACTION     COLONOSCOPY WITH PROPOFOL N/A 03/11/2019   Procedure: COLONOSCOPY WITH PROPOFOL;  Surgeon: Wyline Mood, MD;  Location: [facility];  Service: Endoscopy;  Laterality: N/A;  colon   COLONOSCOPY WITH PROPOFOL N/A 07/30/2022   Procedure: COLONOSCOPY WITH PROPOFOL;  Surgeon: Toledo, Boykin Nearing, MD;  Location: [facility] ENDOSCOPY;  Service: Gastroenterology;  Laterality: N/A;   KNEE SURGERY Bilateral    replacements   POLYPECTOMY  03/11/2019   Procedure: POLYPECTOMY;  Surgeon: Wyline Mood, MD;  Location: [facility] SURGERY [facility]TR;  Service: Endoscopy;;   THYROID  ABLATION  2010    Social History   Socioeconomic History   Marital status: Married    Spouse name: Not on file   Number of children: 4   Years of education: Not on file   Highest education level: Associate degree: occupational, Scientist, product/process development, or vocational program  Occupational History    Employer: UNEMPLOYED  Tobacco Use   Smoking status: Never   Smokeless tobacco: Never  Vaping Use   Vaping status: Never Used  Substance and Sexual Activity   Alcohol use: No   Drug use: No   Sexual activity: Not on file  Other Topics Concern   Not on file  Social History Narrative   Lives in [place] with husband, has 4 children and 15 grandkids         Attends [employer], 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[employer]   Retired [employer]   Social Drivers of Corporate investment banker Strain: Low Risk  (02/18/2023)   Overall Financial Resource Strain (CARDIA)    Difficulty of Paying Living Expenses: Not hard at all  Food Insecurity: No Food Insecurity (02/18/2023)   Hunger Vital Sign    Worried About Running Out of Food in the Last Year: Never true    Ran Out of Food in the Last Year: Never true  Transportation Needs: No Transportation Needs (02/18/2023)   PRAPARE - Administrator, Civil Service (Medical): No    Lack of Transportation (Non-Medical): No  Physical Activity: Sufficiently Active (02/18/2023)   Exercise Vital Sign    Days of Exercise per Week: 6 days    Minutes of Exercise per Session: 30 min  Stress: No Stress Concern Present (02/18/2023)   [employer] of Occupational Health - Occupational Stress Questionnaire    Feeling of Stress : Not at all  Social Connections: Socially Integrated (02/18/2023)   Social Connection and Isolation Panel [NHANES]    Frequency of Communication with Friends and Family: More than three times a week    Frequency of Social Gatherings with Friends and Family: More than three times a week    Attends  Religious Services: More than 4 times per year    Active Member of [employer] or Organizations: Yes    Attends Banker Meetings: More than 4 times per year    Marital Status: Married  Catering manager Violence: Not At Risk (12/23/2022)   Humiliation, Afraid, Rape, and Kick questionnaire    Fear of Current or  Ex-Partner: No    Emotionally Abused: No    Physically Abused: No    Sexually Abused: No     Allergies  Allergen Reactions   Ativan [Lorazepam] Other (See Comments)    High sensitivity, cause pt to pass out   Valium [Diazepam] Other (See Comments)    Hallucinations, tremors, slurred speech   Meloxicam Other (See Comments)    Hallucinations   Trimethoprim Other (See Comments)   Sulfamethoxazole Rash   Sulfonamide Derivatives Rash     CBC    Component Value Date/Time   WBC 7.4 01/13/2023 1520   RBC 4.40 01/13/2023 1520   HGB 14.3 01/13/2023 1520   HGB 14.8 12/01/2013 0312   HCT 42.3 01/13/2023 1520   HCT 43.9 12/01/2013 0312   PLT 252.0 01/13/2023 1520   PLT 215 12/01/2013 0312   MCV 96.3 01/13/2023 1520   MCV 93 12/01/2013 0312   MCH 32.3 12/21/2022 0355   MCHC 33.8 01/13/2023 1520   RDW 14.3 01/13/2023 1520   RDW 13.7 12/01/2013 0312   LYMPHSABS 3.0 01/13/2023 1520   MONOABS 0.6 01/13/2023 1520   EOSABS 0.1 01/13/2023 1520   BASOSABS 0.1 01/13/2023 1520    Pulmonary Functions Testing Results:     No data to display          Outpatient Medications Prior to Visit  Medication Sig Dispense Refill   atorvastatin (LIPITOR) 20 MG tablet TAKE 1 TABLET BY MOUTH ONCE  DAILY 90 tablet 3   cholecalciferol (VITAMIN D3) 25 MCG (1000 UT) tablet Take 1,000 Units by mouth daily. Taking 2,000mg  daily     ELIQUIS 5 MG TABS tablet TAKE 1 TABLET BY MOUTH TWICE  DAILY 180 tablet 3   hydrochlorothiazide (HYDRODIURIL) 25 MG tablet TAKE 1 TABLET BY MOUTH DAILY 90 tablet 3   levothyroxine (SYNTHROID) 50 MCG tablet Take 1 tablet (50 mcg total) by mouth daily. 90 tablet  3   metoprolol tartrate (LOPRESSOR) 25 MG tablet Take 1 tablet (25 mg total) by mouth 2 (two) times daily. 180 tablet 3   potassium chloride (KLOR-CON) 10 MEQ tablet Take 2 tablets (20 mEq total) by mouth daily. TAKE 1 TABLET BY MOUTH  TWICE DAILY 60 tablet 0   promethazine-dextromethorphan (PROMETHAZINE-DM) 6.25-15 MG/5ML syrup Take 5 mLs by mouth every 6 (six) hours as needed for cough. 118 mL 0   sertraline (ZOLOFT) 50 MG tablet TAKE 1 TABLET BY MOUTH DAILY 90 tablet 3   No facility-administered medications prior to visit.

## 2023-04-22 ENCOUNTER — Telehealth: Payer: Self-pay | Admitting: Student in an Organized Health Care Education/Training Program

## 2023-04-22 DIAGNOSIS — R0602 Shortness of breath: Secondary | ICD-10-CM

## 2023-04-22 MED ORDER — ALPRAZOLAM 0.5 MG PO TABS: 0.5000 mg | ORAL_TABLET | Freq: Once | ORAL | 0 refills | Status: AC | PRN

## 2023-04-22 NOTE — Telephone Encounter (Signed)
 I got the swallowing test schedule for Bonnie Shaw and when I was asking about scheduling the chest CT she stated she is very claustrophobic and will need a zanax to take to be able to get the CT done. She can't take valium. Would you be willing to send in Rx for 1-2 pills for her

## 2023-04-24 NOTE — Telephone Encounter (Signed)
 I have left a message for the patient to call and get her CT scheduled now

## 2023-04-27 NOTE — Telephone Encounter (Signed)
 I have spoke with Bonnie Shaw and her CT has been scheduled for 05/07/2023 @ 10:30am at [facility] Entrance. She is aware

## 2023-04-29 ENCOUNTER — Ambulatory Visit
Admission: RE | Admit: 2023-04-29 | Discharge: 2023-04-29 | Disposition: A | Discharge status: Home / Self Care | Source: Ambulatory Visit | Attending: Student in an Organized Health Care Education/Training Program | Admitting: Student in an Organized Health Care Education/Training Program

## 2023-04-29 DIAGNOSIS — R053 Chronic cough: Secondary | ICD-10-CM

## 2023-05-05 ENCOUNTER — Encounter: Payer: Self-pay | Admitting: Student in an Organized Health Care Education/Training Program

## 2023-05-07 ENCOUNTER — Ambulatory Visit
Admission: RE | Admit: 2023-05-07 | Discharge: 2023-05-07 | Disposition: A | Discharge status: Home / Self Care | Source: Ambulatory Visit | Attending: Student in an Organized Health Care Education/Training Program | Admitting: Student in an Organized Health Care Education/Training Program

## 2023-05-07 DIAGNOSIS — R053 Chronic cough: Secondary | ICD-10-CM | POA: Insufficient documentation

## 2023-05-14 ENCOUNTER — Telehealth: Payer: Self-pay | Admitting: Internal Medicine

## 2023-05-14 NOTE — Telephone Encounter (Signed)
 Form received today (pt aware). On Dr Roby Lofts desk for completion

## 2023-05-14 NOTE — Telephone Encounter (Signed)
 Pt states form was faxed by [facility] on 4/3 & again today. I have not seen anything come through & pt is aware. I also gave her our fax number to be sure they are sending to the correct office.

## 2023-05-14 NOTE — Telephone Encounter (Signed)
 Patient called and would like a call from Dr. Lorin Picket or cma, she is looking for a clearance report from [facility]. Please call

## 2023-05-15 ENCOUNTER — Encounter: Payer: Self-pay | Admitting: Internal Medicine

## 2023-05-15 ENCOUNTER — Telehealth (INDEPENDENT_AMBULATORY_CARE_PROVIDER_SITE_OTHER): Admitting: Internal Medicine

## 2023-05-15 VITALS — Ht 63.0 in | Wt 234.0 lb

## 2023-05-15 DIAGNOSIS — D6869 Other thrombophilia: Secondary | ICD-10-CM

## 2023-05-15 DIAGNOSIS — I4821 Permanent atrial fibrillation: Secondary | ICD-10-CM | POA: Diagnosis not present

## 2023-05-15 DIAGNOSIS — E039 Hypothyroidism, unspecified: Secondary | ICD-10-CM

## 2023-05-15 DIAGNOSIS — I10 Essential (primary) hypertension: Secondary | ICD-10-CM

## 2023-05-15 DIAGNOSIS — R739 Hyperglycemia, unspecified: Secondary | ICD-10-CM | POA: Diagnosis not present

## 2023-05-15 DIAGNOSIS — R439 Unspecified disturbances of smell and taste: Secondary | ICD-10-CM

## 2023-05-15 DIAGNOSIS — E785 Hyperlipidemia, unspecified: Secondary | ICD-10-CM | POA: Diagnosis not present

## 2023-05-15 NOTE — Progress Notes (Signed)
 Patient ID: Bonnie Shaw, female   DOB: [birth year], 73 y.o.   MRN: [id number]   Virtual Visit via video Note  I connected with Bonnie Shaw by a video enabled telemedicine application and verified that I am speaking with the correct person using two identifiers. Location patient: home Location provider: work Persons participating in the virtual visit: patient, provider  The limitations, risks, security and privacy concerns of performing an evaluation and management service by video and the availability of in person appointments have been discussed. It has also been discussed with the patient that there may be a patient responsible charge related to this service. The patient expressed understanding and agreed to proceed.   Reason for visit: work in appt.   HPI: Work in to discuss upcoming procedure. Planning to have injection and needs ok to hold eliquis. She is having increased pain. Pain - limiting her activity. Discussed. Previous injection helped. She understands risk of stopping eliquis. No chest pain. Breathing stable. No increased heart rate or palpitations reported. Blood pressure ok. She is still having a "different sensation" - on her tongue. Tried to treat thrush. Taste changed. Different sensation - persistent - tongue. Discussed ENT referral given persistence.    ROS: See pertinent positives and negatives per HPI.  Past Medical History:  Diagnosis Date   Anxiety    Arthritis    Cataract    Complication of anesthesia    Woke up during colonoscopy and during Knee surgery. 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   Hyperlipidemia    Hypertension    Hyperthyroidism    s/p RAI therapy   Hypothyroidism    s/p ablation   Left atrial enlargement    severely enlarged   Migraine headache    2x/yr   Motion sickness    roller coasters   Obesity    Panic disorder     Persistent atrial fibrillation (HCC)    Snoring     Past Surgical History:  Procedure Laterality Date   ABDOMINAL HYSTERECTOMY     CARDIOVERSION N/A 09/11/2017   Procedure: CARDIOVERSION;  Surgeon: Maudine Sos, MD;  Location: [facility] ENDOSCOPY;  Service: Cardiovascular;  Laterality: N/A;   CATARACT EXTRACTION     COLONOSCOPY WITH PROPOFOL N/A 03/11/2019   Procedure: COLONOSCOPY WITH PROPOFOL;  Surgeon: Luke Salaam, MD;  Location: [facility]GERY [facility];  Service: Endoscopy;  Laterality: N/A;  colon   COLONOSCOPY WITH PROPOFOL N/A 07/30/2022   Procedure: COLONOSCOPY WITH PROPOFOL;  Surgeon: Toledo, Alphonsus Jeans, MD;  Location: [facility] ENDOSCOPY;  Service: Gastroenterology;  Laterality: N/A;   KNEE SURGERY Bilateral    replacements   POLYPECTOMY  03/11/2019   Procedure: POLYPECTOMY;  Surgeon: Luke Salaam, MD;  Location: [facility]ERY [facility];  Service: Endoscopy;;   THYROID ABLATION  2010    Family History  Problem Relation Age of Onset   Alzheimer's disease Father    Stroke Mother    Cancer Sister        ovarian    Ovarian cancer Sister 50   Breast cancer Neg Hx     SOCIAL HX: reviewed.    Current Outpatient Medications:    ALPRAZolam (XANAX) 0.5 MG tablet, Take 1 tablet (0.5 mg total) by mouth once as needed for up to 1 dose (for anxiety associated with undegoing CT scan of the chest.)., Disp: 1 tablet, Rfl: 0  atorvastatin (LIPITOR) 20 MG tablet, TAKE 1 TABLET BY MOUTH ONCE  DAILY, Disp: 90 tablet, Rfl: 3   cholecalciferol (VITAMIN D3) 25 MCG (1000 UT) tablet, Take 1,000 Units by mouth daily. Taking 2,000mg  daily, Disp: , Rfl:    ELIQUIS 5 MG TABS tablet, TAKE 1 TABLET BY MOUTH TWICE  DAILY, Disp: 180 tablet, Rfl: 3   hydrochlorothiazide (HYDRODIURIL) 25 MG tablet, TAKE 1 TABLET BY MOUTH DAILY, Disp: 90 tablet, Rfl: 3   levothyroxine (SYNTHROID) 50 MCG tablet, Take 1 tablet (50 mcg total) by mouth daily., Disp: 90 tablet, Rfl: 3   metoprolol tartrate (LOPRESSOR) 25 MG tablet, Take 1  tablet (25 mg total) by mouth 2 (two) times daily., Disp: 180 tablet, Rfl: 3   potassium chloride (KLOR-CON) 10 MEQ tablet, Take 2 tablets (20 mEq total) by mouth daily. TAKE 1 TABLET BY MOUTH  TWICE DAILY, Disp: 60 tablet, Rfl: 0   promethazine-dextromethorphan (PROMETHAZINE-DM) 6.25-15 MG/5ML syrup, Take 5 mLs by mouth every 6 (six) hours as needed for cough., Disp: 118 mL, Rfl: 0   sertraline (ZOLOFT) 50 MG tablet, TAKE 1 TABLET BY MOUTH DAILY, Disp: 90 tablet, Rfl: 3  EXAM:  GENERAL: alert, oriented, appears well and in no acute distress  HEENT: atraumatic, conjunttiva clear, no obvious abnormalities on inspection of external nose and ears  NECK: normal movements of the head and neck  LUNGS: on inspection no signs of respiratory distress, breathing rate appears normal, no obvious gross SOB, gasping or wheezing  CV: no obvious cyanosis  PSYCH/NEURO: pleasant and cooperative, no obvious depression or anxiety, speech and thought processing grossly intact  ASSESSMENT AND PLAN:  Discussed the following assessment and plan:  Problem List Items Addressed This Visit     Acquired thrombophilia (HCC)   Afib. On eliquis.       Atrial fibrillation (HCC) - Primary   Continue eliquis and metoprolol. Stable. She will need to stop eliquis 3 days to have her injection. She understands the risk of stopping, including possible stroke. Form completed. Resume eliquis asap after procedure.       Hyperglycemia   Low carb diet and exercise. Follow met b and A1c.       Hyperlipidemia   The 10-year ASCVD risk score (Arnett DK, et al., 2019) is: 16.6%   Values used to calculate the score:     Age: 69 years     Sex: Female     Is Non-Hispanic African American: No     Diabetic: No     Tobacco smoker: No     Systolic Blood Pressure: 126 mmHg     Is BP treated: Yes     HDL Cholesterol: 44.2 mg/dL     Total Cholesterol: 176 mg/dL  Low cholesterol diet and exercise. Taking lipitor. Follow lipid  panel.       Hypertension   Blood pressure as outlined.  On hydrochlorothiazide and metoprolol. Follow pressures. Follow metabolic panel.       Hypothyroidism   On thyroid replacement. Follow tsh.       Taste disorder   Taste change and different sensation - tongue. Treated for thrush. Persistent. Have ENT evaluate.       Relevant Orders   Ambulatory referral to ENT    Return if symptoms worsen or fail to improve.   I discussed the assessment and treatment plan with the patient. The patient was provided an opportunity to ask questions and all were answered. The patient agreed with the plan and demonstrated  an understanding of the instructions.   The patient was advised to call back or seek an in-person evaluation if the symptoms worsen or if the condition fails to improve as anticipated.    Dellar Fenton, MD

## 2023-05-15 NOTE — Telephone Encounter (Signed)
 See if agreeable for virtual visit today to discuss.

## 2023-05-15 NOTE — Telephone Encounter (Signed)
 Scheduled

## 2023-05-17 ENCOUNTER — Encounter: Payer: Self-pay | Admitting: Internal Medicine

## 2023-05-17 DIAGNOSIS — R439 Unspecified disturbances of smell and taste: Secondary | ICD-10-CM | POA: Insufficient documentation

## 2023-05-17 NOTE — Assessment & Plan Note (Signed)
 Low-carb diet and exercise.  Follow met b and A1c.

## 2023-05-17 NOTE — Assessment & Plan Note (Signed)
 On thyroid replacement.  Follow tsh.

## 2023-05-17 NOTE — Assessment & Plan Note (Signed)
 Continue eliquis and metoprolol. Stable. She will need to stop eliquis 3 days to have her injection. She understands the risk of stopping, including possible stroke. Form completed. Resume eliquis asap after procedure.

## 2023-05-17 NOTE — Assessment & Plan Note (Signed)
 Taste change and different sensation - tongue. Treated for thrush. Persistent. Have ENT evaluate.

## 2023-05-17 NOTE — Assessment & Plan Note (Signed)
Afib.  On eliquis.  

## 2023-05-17 NOTE — Assessment & Plan Note (Signed)
 Blood pressure as outlined.  On hydrochlorothiazide and metoprolol. Follow pressures. Follow metabolic panel.

## 2023-05-17 NOTE — Assessment & Plan Note (Signed)
 The 10-year ASCVD risk score (Arnett DK, et al., 2019) is: 16.6%   Values used to calculate the score:     Age: 73 years     Sex: Female     Is Non-Hispanic African American: No     Diabetic: No     Tobacco smoker: No     Systolic Blood Pressure: 126 mmHg     Is BP treated: Yes     HDL Cholesterol: 44.2 mg/dL     Total Cholesterol: 176 mg/dL  Low cholesterol diet and exercise. Taking lipitor. Follow lipid panel.

## 2023-05-19 ENCOUNTER — Telehealth: Payer: Self-pay | Admitting: Internal Medicine

## 2023-05-19 NOTE — Telephone Encounter (Signed)
 Copied from CRM [id number]. Topic: General - Other >> May 19, 2023  9:31 AM Bonnie Shaw wrote: Reason for CRM: Patient called in wanting Dr. Geralyn Knee nurse to give a call regarding note received on mychart. Please call [phone]

## 2023-05-19 NOTE — Telephone Encounter (Signed)
 Pt calling because her video visit was listed as a no show. Discussed with Sherian Dimitri and corrected. Pt will not be charged.

## 2023-05-19 NOTE — Telephone Encounter (Signed)
 LM for patient

## 2023-05-19 NOTE — Telephone Encounter (Signed)
 Copied from CRM [id number]. Topic: General - Other >> May 19, 2023  2:01 PM Adonis Hoot wrote: Reason for CRM: Patient returned call to Judith Novak would like a return call.She stated that she will be in and put all day

## 2023-06-01 ENCOUNTER — Ambulatory Visit
Admission: EM | Admit: 2023-06-01 | Discharge: 2023-06-01 | Disposition: A | Discharge status: Home / Self Care | Attending: Family Medicine | Admitting: Family Medicine

## 2023-06-01 DIAGNOSIS — B349 Viral infection, unspecified: Secondary | ICD-10-CM | POA: Diagnosis not present

## 2023-06-01 LAB — POC COVID19/FLU A&B COMBO
Covid Antigen, POC: NEGATIVE
Influenza A Antigen, POC: NEGATIVE
Influenza B Antigen, POC: NEGATIVE

## 2023-06-01 MED ORDER — PROMETHAZINE-DM 6.25-15 MG/5ML PO SYRP: 5.0000 mL | ORAL_SOLUTION | Freq: Three times a day (TID) | ORAL | 0 refills | Status: DC | PRN

## 2023-06-01 NOTE — ED Provider Notes (Addendum)
 Bonnie Shaw    CSN: [id number] Arrival date & time: 06/01/23  0802      History   Chief Complaint Chief Complaint  Patient presents with   Cough    HPI Bonnie Shaw is a 73 y.o. female  presents for evaluation of URI symptoms for 2 days. Patient reports associated symptoms of cough, congestion low-grade fever 99. Denies N/V/D, sore throat, ear pain, body aches, shortness of breath. Patient does not have a hx of asthma. Patient is not an active smoker.   Reports no sick contacts.  Pt has taken Mucinex OTC and a prescription of Promethazine  DM for symptoms. Pt has no other concerns at this time.    Cough   Past Medical History:  Diagnosis Date   Anxiety    Arthritis    Cataract    Complication of anesthesia    Woke up during colonoscopy and during Knee surgery. 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   Hyperlipidemia    Hypertension    Hyperthyroidism    s/p RAI therapy   Hypothyroidism    s/p ablation   Left atrial enlargement    severely enlarged   Migraine headache    2x/yr   Motion sickness    roller coasters   Obesity    Panic disorder    Persistent atrial fibrillation (HCC)    Snoring     Patient Active Problem List   Diagnosis Date Noted   Taste disorder 05/17/2023   Persistent cough 04/19/2023   Abnormal CXR 04/18/2023   Acute non-recurrent pansinusitis 02/23/2023   B12 deficiency 02/19/2023   Vaginal irritation 01/03/2023   Dysuria 01/03/2023   Anemia 12/30/2022   Hypoxia 12/20/2022   Hypokalemia 12/19/2022   Chronic atrial fibrillation with RVR (HCC) 12/19/2022   Anxiety and depression 12/19/2022   Dyslipidemia 12/19/2022   Community acquired pneumonia 11/14/2022   Viral illness 10/08/2022   Thumb pain 07/15/2022   Chest pain 03/13/2022   Urinary urgency 03/13/2022   Diarrhea 02/04/2022   Mixed stress and urge  urinary incontinence 12/31/2021   Pre-op evaluation 11/20/2021   Bunion 04/30/2021   Pancreatic cyst 04/30/2021   Joint ache 01/29/2021   Acquired thrombophilia (HCC) 07/13/2020   Severe obesity (BMI >= 40) (HCC) 07/13/2020   Change in bowel movement 11/13/2019   Hyperbilirubinemia 11/13/2019   Colon cancer screening 02/20/2019   Near syncope 09/27/2017   Hyperglycemia 04/26/2017   History of Guillain-Barre syndrome 09/06/2016   Orthostatic hypotension 07/09/2016   Vitamin D  deficiency 12/30/2015   Healthcare maintenance 09/12/2015   Atrial fibrillation (HCC) 12/02/2013   Anxiety 12/02/2013   Health care maintenance 04/28/2013   Hyperlipidemia 02/23/2012   Generalized weakness 08/22/2011   Hypothyroidism 02/20/2011   Hypertension 02/20/2011    Past Surgical History:  Procedure Laterality Date   ABDOMINAL HYSTERECTOMY     CARDIOVERSION N/A 09/11/2017   Procedure: CARDIOVERSION;  Surgeon: Maudine Sos, MD;  Location: [facility] ENDOSCOPY;  Service: Cardiovascular;  Laterality: N/A;   CATARACT EXTRACTION     COLONOSCOPY WITH PROPOFOL  N/A 03/11/2019   Procedure: COLONOSCOPY WITH PROPOFOL ;  Surgeon: Luke Salaam, MD;  Location: [facility];  Service: Endoscopy;  Laterality: N/A;  colon   COLONOSCOPY WITH PROPOFOL  N/A 07/30/2022   Procedure: COLONOSCOPY WITH PROPOFOL ;  Surgeon: Toledo, Alphonsus Jeans, MD;  Location: [facility] ENDOSCOPY;  Service: Gastroenterology;  Laterality: N/A;   KNEE SURGERY Bilateral    replacements   POLYPECTOMY  03/11/2019   Procedure: POLYPECTOMY;  Surgeon: Luke Salaam, MD;  Location: [facility];  Service: Endoscopy;;   THYROID  ABLATION  2010    OB History   No obstetric history on file.      Home Medications    Prior to Admission medications   Medication Sig Start Date End Date Taking? Authorizing Provider  promethazine -dextromethorphan (PROMETHAZINE -DM) 6.25-15 MG/5ML syrup Take 5 mLs by mouth 3 (three) times daily as needed for cough. 06/01/23   Yes Jan Walters, Jodi R, NP  ALPRAZolam  (XANAX ) 0.5 MG tablet Take 1 tablet (0.5 mg total) by mouth once as needed for up to 1 dose (for anxiety associated with undegoing CT scan of the chest.). 04/22/23   Vergia Glasgow, MD  atorvastatin  (LIPITOR) 20 MG tablet TAKE 1 TABLET BY MOUTH ONCE  DAILY 09/12/22   Dellar Fenton, MD  cholecalciferol  (VITAMIN D3) 25 MCG (1000 UT) tablet Take 1,000 Units by mouth daily. Taking 2,000mg  daily    [provider]  ELIQUIS  5 MG TABS tablet TAKE 1 TABLET BY MOUTH TWICE  DAILY 11/21/22   Dellar Fenton, MD  hydrochlorothiazide  (HYDRODIURIL ) 25 MG tablet TAKE 1 TABLET BY MOUTH DAILY 09/23/22   Dellar Fenton, MD  levothyroxine  (SYNTHROID ) 50 MCG tablet Take 1 tablet (50 mcg total) by mouth daily. 09/25/22   Dellar Fenton, MD  metoprolol  tartrate (LOPRESSOR ) 25 MG tablet Take 1 tablet (25 mg total) by mouth 2 (two) times daily. 04/14/23   Dellar Fenton, MD  potassium chloride  (KLOR-CON ) 10 MEQ tablet Take 2 tablets (20 mEq total) by mouth daily. TAKE 1 TABLET BY MOUTH  TWICE DAILY 12/21/22   Amin, Ankit C, MD  sertraline  (ZOLOFT ) 50 MG tablet TAKE 1 TABLET BY MOUTH DAILY 09/23/22   Dellar Fenton, MD    Family History Family History  Problem Relation Age of Onset   Alzheimer's disease Father    Stroke Mother    Cancer Sister        ovarian    Ovarian cancer Sister 35   Breast cancer Neg Hx     Social History Social History   Tobacco Use   Smoking status: Never   Smokeless tobacco: Never  Vaping Use   Vaping status: Never Used  Substance Use Topics   Alcohol use: No   Drug use: No     Allergies   Ativan  [lorazepam ], Valium [diazepam], Meloxicam, Trimethoprim, Sulfamethoxazole, and Sulfonamide derivatives   Review of Systems Review of Systems  HENT:  Positive for congestion.   Respiratory:  Positive for cough.      Physical Exam Triage Vital Signs ED Triage Vitals  Encounter Vitals Group     BP 06/01/23 0811 (!) 144/90      Systolic BP Percentile --      Diastolic BP Percentile --      Pulse Rate 06/01/23 0811 100     Resp 06/01/23 0811 18     Temp 06/01/23 0811 99.1 F (37.3 C)     Temp src --      SpO2 06/01/23 0811 98 %     Weight --      Height --      Head Circumference --      Peak Flow --      Pain Score 06/01/23 0815 0     Pain Loc --      Pain Education --      Exclude  from Growth Chart --    No data found.  Updated Vital Signs BP (!) 144/90   Pulse 100   Temp 99.1 F (37.3 C)   Resp 18   SpO2 98%   Visual Acuity Right Eye Distance:   Left Eye Distance:   Bilateral Distance:    Right Eye Near:   Left Eye Near:    Bilateral Near:     Physical Exam Vitals and nursing note reviewed.  Constitutional:      General: She is not in acute distress.    Appearance: She is well-developed. She is not ill-appearing.  HENT:     Head: Normocephalic and atraumatic.     Right Ear: Tympanic membrane and ear canal normal.     Left Ear: Tympanic membrane and ear canal normal.     Nose: Congestion present.     Mouth/Throat:     Mouth: Mucous membranes are moist.     Pharynx: Oropharynx is clear. Uvula midline. No oropharyngeal exudate or posterior oropharyngeal erythema.     Tonsils: No tonsillar exudate or tonsillar abscesses.  Eyes:     Conjunctiva/sclera: Conjunctivae normal.     Pupils: Pupils are equal, round, and reactive to light.  Cardiovascular:     Rate and Rhythm: Normal rate and regular rhythm.     Heart sounds: Normal heart sounds.  Pulmonary:     Effort: Pulmonary effort is normal.     Breath sounds: Normal breath sounds. No wheezing, rhonchi or rales.  Musculoskeletal:     Cervical back: Normal range of motion and neck supple.  Lymphadenopathy:     Cervical: No cervical adenopathy.  Skin:    General: Skin is warm and dry.  Neurological:     General: No focal deficit present.     Mental Status: She is alert and oriented to person, place, and time.  Psychiatric:         Mood and Affect: Mood normal.        Behavior: Behavior normal.      UC Treatments / Results  Labs (all labs ordered are listed, but only abnormal results are displayed) Labs Reviewed  POC COVID19/FLU A&B COMBO   Basic metabolic panel Order: [id number]  Status: Final result     Next appt: 06/09/2023 at 01:40 PM in Family [facility] WELLNESS VISIT)     Dx: Primary hypertension   Test Result Released: Yes (seen)   2 Result Notes     View Follow-Up Encounter          Component Ref Range & Units (hover) 3 mo ago (02/19/23) 4 mo ago (01/13/23) 5 mo ago (12/21/22) 5 mo ago (12/20/22) 5 mo ago (12/19/22) 5 mo ago (12/18/22) 5 mo ago (12/12/22)  Sodium 144 141 136 R 139 R 138 R 132 Low  R   Potassium 4.3 3.9 3.6 R 3.6 R 3.4 Low  R 3.0 Low  R 3.9  Chloride 103 105 103 R 106 R 104 R 100 R   CO2 30 27 26  R 27 R 25 R 21 Low  R   Glucose, Bld 109 High  112 High  103 High  CM 113 High  CM 103 High  CM 187 High  CM   BUN 15 15 7  Low  R 7 Low  R 10 R 12 R   Creatinine, Ser 0.74 0.73 0.57 R 0.62 R 0.59 R 0.77 R   GFR 80.27 81.65 CM       Comment:  Calculated using the CKD-EPI Creatinine Equation (2021)  Calcium  9.7 9.3 8.0 Low  R 8.1 Low  R 8.1 Low  R 8.7 Low  R   Resulting Agency [place] [facility] [place] [facility] [place] [facility]        Specimen Collected: 02/19/23 14:30 Last Resulted: 02/20/23 11:11      EKG   Radiology No results found.  Procedures Procedures (including critical care time)  Medications Ordered in UC Medications - No data to display  Initial Impression / Assessment and Plan / UC Course  I have reviewed the triage vital signs and the nursing notes.  Pertinent labs & imaging results that were available during my care of the patient were reviewed by me and considered in my medical decision making (see chart for details).     Reviewed exam and symptoms with patient.  No red flags.  Negative  flu and COVID testing.  Discussed viral illness and symptomatic treatment.  Promethazine  DM as needed for cough, side effect profile reviewed.  Discussed rest fluids and PCP follow-up 2 to 3 days for recheck.  ER precautions reviewed and patient verbalized understanding. Final Clinical Impressions(s) / UC Diagnoses   Final diagnoses:  Viral illness     Discharge Instructions      You have tested negative for COVID and flu.  I have sent Promethazine  DM for you to take as needed for your cough.  Please note this medication will make you drowsy.  Do not drink alcohol or drive on this medication.  Lots of rest and fluids.  Please follow-up with your PCP in 2 to 3 days for recheck.  Please go to the ER for any worsening symptoms.  Hope you feel better soon!     ED Prescriptions     Medication Sig Dispense Auth. Provider   promethazine -dextromethorphan (PROMETHAZINE -DM) 6.25-15 MG/5ML syrup Take 5 mLs by mouth 3 (three) times daily as needed for cough. 118 mL Pegge Cumberledge, Jodi R, NP      PDMP not reviewed this encounter.   Alleen Arbour, NP 06/01/23 [id number]    Alleen Arbour, NP 06/01/23 [phone]

## 2023-06-01 NOTE — Discharge Instructions (Signed)
 You have tested negative for COVID and flu.  I have sent Promethazine  DM for you to take as needed for your cough.  Please note this medication will make you drowsy.  Do not drink alcohol or drive on this medication.  Lots of rest and fluids.  Please follow-up with your PCP in 2 to 3 days for recheck.  Please go to the ER for any worsening symptoms.  Hope you feel better soon!

## 2023-06-01 NOTE — ED Triage Notes (Signed)
 Patient presents to UC for cough and chest congestion x 2 days. Treating with promethazine  and mucienx.   Denies fever or SOB.

## 2023-06-07 ENCOUNTER — Encounter: Payer: Self-pay | Admitting: Student in an Organized Health Care Education/Training Program

## 2023-06-09 ENCOUNTER — Ambulatory Visit (INDEPENDENT_AMBULATORY_CARE_PROVIDER_SITE_OTHER): Admitting: *Deleted

## 2023-06-09 ENCOUNTER — Ambulatory Visit (INDEPENDENT_AMBULATORY_CARE_PROVIDER_SITE_OTHER): Admitting: Nurse Practitioner

## 2023-06-09 ENCOUNTER — Ambulatory Visit
Admission: RE | Admit: 2023-06-09 | Discharge: 2023-06-09 | Disposition: A | Discharge status: Home / Self Care | Attending: Nurse Practitioner | Admitting: Nurse Practitioner

## 2023-06-09 ENCOUNTER — Encounter: Payer: Self-pay | Admitting: Nurse Practitioner

## 2023-06-09 ENCOUNTER — Ambulatory Visit
Admission: RE | Admit: 2023-06-09 | Discharge: 2023-06-09 | Disposition: A | Discharge status: Home / Self Care | Source: Ambulatory Visit | Attending: Nurse Practitioner

## 2023-06-09 VITALS — Ht 63.0 in | Wt 225.0 lb

## 2023-06-09 VITALS — BP 134/86 | HR 63 | Temp 98.0°F | Ht 63.0 in | Wt 224.6 lb

## 2023-06-09 DIAGNOSIS — R051 Acute cough: Secondary | ICD-10-CM | POA: Diagnosis present

## 2023-06-09 DIAGNOSIS — Z0000 Encounter for general adult medical examination without abnormal findings: Secondary | ICD-10-CM | POA: Diagnosis not present

## 2023-06-09 DIAGNOSIS — R0989 Other specified symptoms and signs involving the circulatory and respiratory systems: Secondary | ICD-10-CM

## 2023-06-09 MED ORDER — PREDNISONE 20 MG PO TABS: 20.0000 mg | ORAL_TABLET | Freq: Every day | ORAL | 0 refills | Status: AC

## 2023-06-09 MED ORDER — AMOXICILLIN-POT CLAVULANATE 875-125 MG PO TABS
1.0000 | ORAL_TABLET | Freq: Two times a day (BID) | ORAL | 0 refills | Status: DC
Start: 2023-06-09 — End: 2023-06-23

## 2023-06-09 NOTE — Progress Notes (Signed)
 Subjective:   Bonnie Shaw is a 73 y.o. who presents for a Medicare Wellness preventive visit.  Visit Complete: Virtual I connected with  Bonnie Shaw on 06/09/23 by a audio enabled telemedicine application and verified that I am speaking with the correct person using two identifiers.  Patient Location: Home  Provider Location: Office/Clinic  I discussed the limitations of evaluation and management by telemedicine. The patient expressed understanding and agreed to proceed.  Vital Signs: Because this visit was a virtual/telehealth visit, some criteria may be missing or patient reported. Any vitals not documented were not able to be obtained and vitals that have been documented are patient reported.  VideoDeclined- This patient declined Librarian, academic. Therefore the visit was completed with audio only.  Persons Participating in Visit: Patient.  AWV Questionnaire: No: Patient Medicare AWV questionnaire was not completed prior to this visit.  Cardiac Risk Factors include: advanced age (>37men, >40 women);dyslipidemia;obesity (BMI >30kg/m2);hypertension     Objective:    Today's Vitals   06/09/23 1348  Weight: 225 lb (102.1 kg)  Height: 5\' 3"  (1.6 m)  PainSc: 3    Body mass index is 39.86 kg/m.     06/09/2023    2:04 PM 12/19/2022    9:00 AM 12/18/2022   10:12 PM 10/21/2022   10:29 AM 10/08/2022   10:54 AM 07/30/2022    7:32 AM 05/09/2022    2:12 PM  Advanced Directives  Does Patient Have a Medical Advance Directive? No No No No No No No  Would patient like information on creating a medical advance directive? No - Patient declined No - Patient declined     No - Patient declined    Current Medications (verified) Outpatient Encounter Medications as of 06/09/2023  Medication Sig   ALPRAZolam  (XANAX ) 0.5 MG tablet Take 1 tablet (0.5 mg total) by mouth once as needed for up to 1 dose (for anxiety associated with undegoing CT scan of the chest.).    atorvastatin  (LIPITOR) 20 MG tablet TAKE 1 TABLET BY MOUTH ONCE  DAILY   cholecalciferol  (VITAMIN D3) 25 MCG (1000 UT) tablet Take 1,000 Units by mouth daily. Taking 2,000mg  daily   Cyanocobalamin  (VITAMIN B 12 PO) Take by mouth daily.   ELIQUIS  5 MG TABS tablet TAKE 1 TABLET BY MOUTH TWICE  DAILY   hydrochlorothiazide  (HYDRODIURIL ) 25 MG tablet TAKE 1 TABLET BY MOUTH DAILY   levothyroxine  (SYNTHROID ) 50 MCG tablet Take 1 tablet (50 mcg total) by mouth daily.   metoprolol  tartrate (LOPRESSOR ) 25 MG tablet Take 1 tablet (25 mg total) by mouth 2 (two) times daily.   potassium chloride  (KLOR-CON ) 10 MEQ tablet Take 2 tablets (20 mEq total) by mouth daily. TAKE 1 TABLET BY MOUTH  TWICE DAILY   promethazine -dextromethorphan (PROMETHAZINE -DM) 6.25-15 MG/5ML syrup Take 5 mLs by mouth 3 (three) times daily as needed for cough.   sertraline  (ZOLOFT ) 50 MG tablet TAKE 1 TABLET BY MOUTH DAILY   No facility-administered encounter medications on file as of 06/09/2023.    Allergies (verified) Ativan  [lorazepam ], Valium [diazepam], Meloxicam, Trimethoprim, Sulfamethoxazole, and Sulfonamide derivatives   History: Past Medical History:  Diagnosis Date   Anxiety    Arthritis    Cataract    Complication of anesthesia    Woke up during colonoscopy and during Knee surgery. Also says "nitrous makes me crazy"   Family history of adverse reaction to anesthesia    son woke up during TEE   Guillain-Barre (HCC) 2018  recovered   Hyperlipidemia    Hypertension    Hyperthyroidism    s/p RAI therapy   Hypothyroidism    s/p ablation   Left atrial enlargement    severely enlarged   Migraine headache    2x/yr   Motion sickness    roller coasters   Obesity    Panic disorder    Persistent atrial fibrillation (HCC)    Snoring    Past Surgical History:  Procedure Laterality Date   ABDOMINAL HYSTERECTOMY     CARDIOVERSION N/A 09/11/2017   Procedure: CARDIOVERSION;  Surgeon: Maudine Sos, MD;   Location: [facility] ENDOSCOPY;  Service: Cardiovascular;  Laterality: N/A;   CATARACT EXTRACTION     COLONOSCOPY WITH PROPOFOL  N/A 03/11/2019   Procedure: COLONOSCOPY WITH PROPOFOL ;  Surgeon: Luke Salaam, MD;  Location: [facility];  Service: Endoscopy;  Laterality: N/A;  colon   COLONOSCOPY WITH PROPOFOL  N/A 07/30/2022   Procedure: COLONOSCOPY WITH PROPOFOL ;  Surgeon: Toledo, Alphonsus Jeans, MD;  Location: [facility] ENDOSCOPY;  Service: Gastroenterology;  Laterality: N/A;   KNEE SURGERY Bilateral    replacements   POLYPECTOMY  03/11/2019   Procedure: POLYPECTOMY;  Surgeon: Luke Salaam, MD;  Location: [facility];  Service: Endoscopy;;   THYROID  ABLATION  2010   Family History  Problem Relation Age of Onset   Alzheimer's disease Father    Stroke Mother    Cancer Sister        ovarian    Ovarian cancer Sister 48   Breast cancer Neg Hx    Social History   Socioeconomic History   Marital status: Married    Spouse name: Not on file   Number of children: 4   Years of education: Not on file   Highest education level: Associate degree: occupational, Scientist, product/process development, or vocational program  Occupational History    Employer: UNEMPLOYED  Tobacco Use   Smoking status: Never   Smokeless tobacco: Never  Vaping Use   Vaping status: Never Used  Substance and Sexual Activity   Alcohol use: No   Drug use: No   Sexual activity: Not on file  Other Topics Concern   Not on file  Social History Narrative   Lives in [place] with husband, has 4 children and 15 grandkids         Attends [employer], 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[employer]   Retired [employer]   Social Drivers of Corporate investment banker Strain: Low Risk  (06/09/2023)   Overall Financial Resource Strain (CARDIA)    Difficulty of Paying Living Expenses: Not hard at all  Food Insecurity: No Food Insecurity (06/09/2023)   Hunger Vital Sign    Worried About Running Out of Food in the Last Year: Never  true    Ran Out of Food in the Last Year: Never true  Transportation Needs: No Transportation Needs (06/09/2023)   PRAPARE - Administrator, Civil Service (Medical): No    Lack of Transportation (Non-Medical): No  Physical Activity: Sufficiently Active (06/09/2023)   Exercise Vital Sign    Days of Exercise per Week: 3 days    Minutes of Exercise per Session: 50 min  Stress: No Stress Concern Present (06/09/2023)   [employer] of Occupational Health - Occupational Stress Questionnaire    Feeling of Stress : Only a little  Social Connections: Moderately Integrated (06/09/2023)   Social Connection and Isolation Panel [NHANES]    Frequency of  Communication with Friends and Family: More than three times a week    Frequency of Social Gatherings with Friends and Family: More than three times a week    Attends Religious Services: More than 4 times per year    Active Member of [employer] or Organizations: No    Attends Engineer, structural: Never    Marital Status: Married    Tobacco Counseling Counseling given: Not Answered    Clinical Intake:  Pre-visit preparation completed: Yes  Pain : 0-10 Pain Score: 3  Pain Type: Acute pain Pain Location: Rib cage (from coughing) Pain Descriptors / Indicators: Stabbing (when coughing) Pain Onset: In the past 7 days Pain Frequency: Intermittent     BMI - recorded: 39.86 Nutritional Status: BMI > 30  Obese Nutritional Risks: None Diabetes: No  Lab Results  Component Value Date   HGBA1C 6.5 02/19/2023   HGBA1C 6.1 11/11/2022   HGBA1C 5.9 07/14/2022     How often do you need to have someone help you when you read instructions, pamphlets, or other written materials from your doctor or pharmacy?: 1 - Never  Interpreter Needed?: No  Information entered by :: R. Tangia Pinard LPN   Activities of Daily Living     06/09/2023    1:53 PM 12/19/2022    9:00 AM  In your present state of health, do you have any difficulty performing  the following activities:  Hearing? 0 0  Vision? 0 0  Comment no glasses   Difficulty concentrating or making decisions? 0 0  Walking or climbing stairs? 1   Dressing or bathing? 0   Doing errands, shopping? 0 0  Preparing Food and eating ? N   Using the Toilet? N   In the past six months, have you accidently leaked urine? Y   Comment wears diapers recently   Do you have problems with loss of bowel control? N   Managing your Medications? N   Managing your Finances? N   Housekeeping or managing your Housekeeping? N     Patient Care Team: Dellar Fenton, MD as PCP - General (Internal Medicine) Jolly Needle, MD (Inactive) as PCP - Electrophysiology (Cardiology) Jolly Needle, MD (Inactive) as PCP - Cardiology (Cardiology) Vergia Glasgow, MD as Consulting Physician (Pulmonary Disease)  Indicate any recent Medical Services you may have received from other than [facility] providers in the past year (date may be approximate).     Assessment:   This is a routine wellness examination for Dyann.  Hearing/Vision screen Hearing Screening - Comments:: No issues Vision Screening - Comments:: No glasses   Goals Addressed             This Visit's Progress    Patient Stated       Wants to lose weight       Depression Screen     06/09/2023    2:00 PM 05/09/2022    2:11 PM 02/04/2022    8:08 AM 01/16/2022    8:14 AM 09/03/2021    1:21 PM 05/02/2021    2:14 PM 04/30/2021    1:14 PM  PHQ 2/9 Scores  PHQ - 2 Score 0 0 0 2 0 0 0  PHQ- 9 Score 2  1 4        Fall Risk     06/09/2023    1:55 PM 05/09/2022    2:13 PM 03/13/2022   11:42 AM 02/04/2022    8:07 AM 01/16/2022    8:14 AM  Fall Risk  Falls in the past year? 0 1 1 1 1   Number falls in past yr: 0 0 0 0 0  Injury with Fall? 0 1 1 1 1   Comment  None since last reported to pcp     Risk for fall due to : No Fall Risks History of fall(s) History of fall(s) History of fall(s) History of fall(s);Impaired balance/gait  Follow up Falls  prevention discussed;Falls evaluation completed Falls evaluation completed;Falls prevention discussed Falls evaluation completed Falls evaluation completed Falls evaluation completed    MEDICARE RISK AT HOME:  Medicare Risk at Home Any stairs in or around the home?: Yes If so, are there any without handrails?: No Home free of loose throw rugs in walkways, pet beds, electrical cords, etc?: Yes Adequate lighting in your home to reduce risk of falls?: Yes Life alert?: No Use of a cane, walker or w/c?: No Grab bars in the bathroom?: No Shower chair or bench in shower?: No Elevated toilet seat or a handicapped toilet?: Yes  TIMED UP AND GO:  Was the test performed?  No  Cognitive Function: 6CIT completed        06/09/2023    2:05 PM 05/09/2022    2:22 PM  6CIT Screen  What Year? 0 points 0 points  What month? 0 points 0 points  What time? 0 points 0 points  Count back from 20 0 points 0 points  Months in reverse 0 points 0 points  Repeat phrase 0 points 0 points  Total Score 0 points 0 points    Immunizations Immunization History  Administered Date(s) Administered   Fluad Quad(high Dose 65+) 11/03/2018, 11/02/2019, 11/20/2021   Influenza Split 11/11/2011   Influenza, High Dose Seasonal PF 11/09/2013, 12/18/2014, 02/10/2017, 10/08/2017   Influenza,inj,Quad PF,6+ Mos 11/09/2013, 12/18/2014   Influenza-Unspecified 12/12/2015, 11/03/2020   PFIZER(Purple Top)SARS-COV-2 Vaccination 02/25/2019, 03/18/2019, 11/30/2019   Pneumococcal Conjugate-13 12/18/2014   Pneumococcal Polysaccharide-23 12/19/2015   Tdap 01/15/2022    Screening Tests Health Maintenance  Topic Date Due   COVID-19 Vaccine (4 - 2024-25 season) 10/05/2022   Medicare Annual Wellness (AWV)  06/14/2023 (Originally 05/09/2023)   Zoster Vaccines- Shingrix (1 of 2) 07/15/2023 (Originally 09/23/1969)   INFLUENZA VACCINE  09/04/2023   MAMMOGRAM  04/13/2025   Colonoscopy  07/29/2025   Pneumonia Vaccine 53+ Years old   Completed   DEXA SCAN  Completed   Hepatitis C Screening  Completed   HPV VACCINES  Aged Out   Meningococcal B Vaccine  Aged Out   DTaP/Tdap/Td  Discontinued    Health Maintenance  Health Maintenance Due  Topic Date Due   COVID-19 Vaccine (4 - 2024-25 season) 10/05/2022   Health Maintenance Items Addressed: Patient reminded to get a flu vaccine annually. Discussed with patient the need to update covid and shingles vaccines.   Additional Screening:  Vision Screening: Recommended annual ophthalmology exams for early detection of glaucoma and other disorders of the eye. Up to date [facility]  Dental Screening: Recommended annual dental exams for proper oral hygiene  Community Resource Referral / Chronic Care Management: CRR required this visit?  No   CCM required this visit?  No     Plan:     I have personally reviewed and noted the following in the patient's chart:   Medical and social history Use of alcohol, tobacco or illicit drugs  Current medications and supplements including opioid prescriptions. Patient is not currently taking opioid prescriptions. Functional ability and status Nutritional status Physical activity Advanced directives List  of other physicians Hospitalizations, surgeries, and ER visits in previous 12 months Vitals Screenings to include cognitive, depression, and falls Referrals and appointments  In addition, I have reviewed and discussed with patient certain preventive protocols, quality metrics, and best practice recommendations. A written personalized care plan for preventive services as well as general preventive health recommendations were provided to patient.     Felicitas Horse, LPN   02/08/1094   After Visit Summary: (MyChart) Due to this being a telephonic visit, the after visit summary with patients personalized plan was offered to patient via MyChart   Notes: Please refer to Routing Comments.

## 2023-06-09 NOTE — Progress Notes (Signed)
 Established Patient Office Visit  Subjective:  Patient ID: Bonnie Shaw, female    DOB: 1950-06-11  Age: 73 y.o. MRN: [id number]  CC:  Chief Complaint  Patient presents with  . Acute Visit    Cough x 1 week Lots of mucous No Appetite SOB Saw UC tested Negative for Covid & Flu    HPI  Bonnie Shaw presents for acute visit. Has cough and congestion since last week. Was seen in the UC on 06/01/23 was diagnosed with viral illness tested negative for Flu and COVID. Was prescribed promethazine  for cough.   Denise fever, has coughing spell. Cough The current episode started 1 to 4 weeks ago. The problem has been gradually worsening. The cough is Productive of sputum. Associated symptoms include headaches, postnasal drip, shortness of breath (some) and wheezing. Pertinent negatives include no chest pain, ear congestion, fever, nasal congestion or sore throat. The symptoms are aggravated by lying down. She has tried prescription cough suppressant for the symptoms. The treatment provided no relief. Her past medical history is significant for pneumonia.     Past Medical History:  Diagnosis Date  . Anxiety   . Arthritis   . Cataract   . Complication of anesthesia    Woke up during colonoscopy and during Knee surgery. Also says "nitrous makes me crazy"  . Family history of adverse reaction to anesthesia    son woke up during TEE  . Guillain-Barre (HCC) 2018   recovered  . Hyperlipidemia   . Hypertension   . Hyperthyroidism    s/p RAI therapy  . Hypothyroidism    s/p ablation  . Left atrial enlargement    severely enlarged  . Migraine headache    2x/yr  . Motion sickness    roller coasters  . Obesity   . Panic disorder   . Persistent atrial fibrillation (HCC)   . Snoring     Past Surgical History:  Procedure Laterality Date  . ABDOMINAL HYSTERECTOMY    . CARDIOVERSION N/A 09/11/2017   Procedure: CARDIOVERSION;  Surgeon: Maudine Sos, MD;  Location: [facility] ENDOSCOPY;   Service: Cardiovascular;  Laterality: N/A;  . CATARACT EXTRACTION    . COLONOSCOPY WITH PROPOFOL  N/A 03/11/2019   Procedure: COLONOSCOPY WITH PROPOFOL ;  Surgeon: Luke Salaam, MD;  Location: [facility] SURGERY CNTR;  Service: Endoscopy;  Laterality: N/A;  colon  . COLONOSCOPY WITH PROPOFOL  N/A 07/30/2022   Procedure: COLONOSCOPY WITH PROPOFOL ;  Surgeon: Toledo, Alphonsus Jeans, MD;  Location: [facility] ENDOSCOPY;  Service: Gastroenterology;  Laterality: N/A;  . KNEE SURGERY Bilateral    replacements  . POLYPECTOMY  03/11/2019   Procedure: POLYPECTOMY;  Surgeon: Luke Salaam, MD;  Location: [facility] SURGERY [facility];  Service: Endoscopy;;  . THYROID  ABLATION  2010    Family History  Problem Relation Age of Onset  . Alzheimer's disease Father   . Stroke Mother   . Cancer Sister        ovarian   . Ovarian cancer Sister 34  . Breast cancer Neg Hx     Social History   Socioeconomic History  . Marital status: Married    Spouse name: Not on file  . Number of children: 4  . Years of education: Not on file  . Highest education level: Associate degree: occupational, Scientist, product/process development, or vocational program  Occupational History    Employer: UNEMPLOYED  Tobacco Use  . Smoking status: Never  . Smokeless tobacco: Never  Vaping Use  . Vaping status: Never Used  Substance and Sexual  Activity  . Alcohol use: No  . Drug use: No  . Sexual activity: Not on file  Other Topics Concern  . 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 Difficulty of Paying Living Expenses: Not hard at all  Food Insecurity: No Food Insecurity (06/09/2023)   Hunger Vital Sign   . Worried About Programme researcher, broadcasting/film/video in the Last Year: Never  true   . Ran Out of Food in the Last Year: Never true  Transportation Needs: No Transportation Needs (06/09/2023)   PRAPARE - Transportation   . Lack of Transportation (Medical): No   . Lack of Transportation (Non-Medical): No  Physical Activity: Sufficiently Active (06/09/2023)   Exercise Vital Sign   . Days of Exercise per Week: 3 days   . Minutes of Exercise per Session: 50 min  Stress: No Stress Concern Present (06/09/2023)   [employer] of Occupational Health - Occupational Stress Questionnaire   . Feeling of Stress : Only a little  Social Connections: Moderately Integrated (06/09/2023)   Social Connection and Isolation Panel [NHANES]   . Frequency of Communication with Friends and Family: More than three times a week   . Frequency of Social Gatherings with Friends and Family: More than three times a week   . Attends Religious Services: More than 4 times per year   . Active Member of Clubs or Organizations: No   . Attends Banker Meetings: Never   . Marital Status: Married  Catering manager Violence: Not At Risk (06/09/2023)   Humiliation, Afraid, Rape, and Kick questionnaire   . Fear of Current or Ex-Partner: No   . Emotionally Abused: No   . Physically Abused: No   . Sexually Abused: No     Outpatient Medications Prior to Visit  Medication Sig Dispense Refill  . ALPRAZolam  (XANAX ) 0.5 MG tablet Take 1 tablet (0.5 mg total) by mouth once as needed for up to 1 dose (for anxiety associated with undegoing CT scan of the chest.). 1 tablet 0  . cholecalciferol  (VITAMIN D3) 25 MCG (1000 UT) tablet Take 1,000 Units by mouth daily. Taking 2,000mg  daily    . Cyanocobalamin  (VITAMIN B 12 PO) Take by mouth daily.    . metoprolol  tartrate (LOPRESSOR ) 25 MG tablet Take 1 tablet (25 mg total) by mouth 2 (two) times daily. 180 tablet 3  . potassium chloride  (KLOR-CON ) 10 MEQ tablet Take 2 tablets (20 mEq total) by mouth daily. TAKE 1 TABLET BY MOUTH  TWICE DAILY 60 tablet 0  .  sertraline  (ZOLOFT ) 50 MG tablet TAKE 1 TABLET BY MOUTH DAILY 90 tablet 3  . atorvastatin  (LIPITOR) 20 MG tablet TAKE 1 TABLET BY MOUTH ONCE  DAILY 90 tablet 3  . ELIQUIS  5 MG TABS tablet TAKE 1 TABLET BY MOUTH TWICE  DAILY 180 tablet 3  . hydrochlorothiazide  (HYDRODIURIL ) 25 MG tablet TAKE 1 TABLET BY MOUTH DAILY 90 tablet 3  . levothyroxine  (SYNTHROID ) 50 MCG tablet Take 1 tablet (50 mcg total) by mouth daily. 90 tablet 3  . promethazine -dextromethorphan (PROMETHAZINE -DM) 6.25-15 MG/5ML syrup Take 5 mLs by mouth 3 (  three) times daily as needed for cough. 118 mL 0   No facility-administered medications prior to visit.    Allergies  Allergen Reactions  . Ativan  [Lorazepam ] Other (See Comments)    High sensitivity, cause pt to pass out  . Valium [Diazepam] Other (See Comments)    Hallucinations, tremors, slurred speech  . Meloxicam Other (See Comments)    Hallucinations  . Trimethoprim Other (See Comments)  . Sulfamethoxazole Rash  . Sulfonamide Derivatives Rash    ROS Review of Systems  Constitutional:  Negative for fever.  HENT:  Positive for postnasal drip. Negative for sore throat.   Respiratory:  Positive for cough, shortness of breath (some) and wheezing.   Cardiovascular:  Negative for chest pain.  Neurological:  Positive for headaches.   Negative unless indicated in HPI.    Objective:    Physical Exam Constitutional:      Appearance: Normal appearance.  HENT:     Right Ear: Tympanic membrane normal. Tympanic membrane is not erythematous.     Left Ear: Tympanic membrane normal. Tympanic membrane is not erythematous.     Nose:     Right Sinus: No maxillary sinus tenderness or frontal sinus tenderness.     Left Sinus: No maxillary sinus tenderness or frontal sinus tenderness.     Mouth/Throat:     Mouth: Mucous membranes are moist.     Pharynx: Postnasal drip present. No pharyngeal swelling, oropharyngeal exudate or posterior oropharyngeal erythema.     Tonsils: No  tonsillar exudate.  Cardiovascular:     Rate and Rhythm: Normal rate. Rhythm irregular.  Pulmonary:     Effort: Pulmonary effort is normal.     Breath sounds: No stridor. Rhonchi present.  Neurological:     General: No focal deficit present.     Mental Status: She is alert and oriented to person, place, and time. Mental status is at baseline.  Psychiatric:        Mood and Affect: Mood normal.        Behavior: Behavior normal.        Thought Content: Thought content normal.    BP 134/86   Pulse 63   Temp 98 F (36.7 C)   Ht 5\' 3"  (1.6 m)   Wt 224 lb 9.6 oz (101.9 kg)   SpO2 95%   BMI 39.79 kg/m  Wt Readings from Last 3 Encounters:  06/23/23 228 lb 3.2 oz (103.5 kg)  06/09/23 224 lb 9.6 oz (101.9 kg)  06/09/23 225 lb (102.1 kg)     Health Maintenance  Topic Date Due  . COVID-19 Vaccine (4 - 2024-25 season) 06/25/2023 (Originally 10/05/2022)  . Zoster Vaccines- Shingrix (1 of 2) 07/15/2023 (Originally 09/23/1968)  . INFLUENZA VACCINE  09/04/2023  . Medicare Annual Wellness (AWV)  06/08/2024  . MAMMOGRAM  04/13/2025  . Colonoscopy  07/29/2025  . Pneumonia Vaccine 1+ Years old  Completed  . DEXA SCAN  Completed  . Hepatitis C Screening  Completed  . HPV VACCINES  Aged Out  . Meningococcal B Vaccine  Aged Out  . DTaP/Tdap/Td  Discontinued    There are no preventive care reminders to display for this patient.  Lab Results  Component Value Date   TSH 2.04 02/19/2023   Lab Results  Component Value Date   WBC 7.4 01/13/2023   HGB 14.3 01/13/2023   HCT 42.3 01/13/2023   MCV 96.3 01/13/2023   PLT 252.0 01/13/2023   Lab Results  Component Value Date   NA  143 06/19/2023   K 4.4 06/19/2023   CO2 31 06/19/2023   GLUCOSE 114 (H) 06/19/2023   BUN 9 06/19/2023   CREATININE 0.72 06/19/2023   BILITOT 1.1 06/19/2023   ALKPHOS 58 06/19/2023   AST 13 06/19/2023   ALT 14 06/19/2023   PROT 6.5 06/19/2023   ALBUMIN 3.9 06/19/2023   CALCIUM  9.4 06/19/2023   ANIONGAP 7  12/21/2022   GFR 82.76 06/19/2023   Lab Results  Component Value Date   CHOL 187 06/19/2023   Lab Results  Component Value Date   HDL 52.00 06/19/2023   Lab Results  Component Value Date   LDLCALC 102 (H) 06/19/2023   Lab Results  Component Value Date   TRIG 162.0 (H) 06/19/2023   Lab Results  Component Value Date   CHOLHDL 4 06/19/2023   Lab Results  Component Value Date   HGBA1C 6.5 06/19/2023      Assessment & Plan:  Acute cough Assessment & Plan: Vital signs stable, patient in is no sign of acute distress, nontoxic.  Discussed findings with the patient.  Symptoms present for a weeks with no sign of resolution, will treat with Augmentin  and  prednisone . Increase fluid intake, rest. Chest Xray normal. Will let us  know if symptoms not improving   Orders: -     DG Chest 2 View; Future  Rhonchi  Other orders -     predniSONE ; Take 1 tablet (20 mg total) by mouth daily with breakfast for 5 days.  Dispense: 5 tablet; Refill: 0    Follow-up: No follow-ups on file.   Tashawna Thom, NP

## 2023-06-09 NOTE — Patient Instructions (Signed)
 Ms. Bonnie Shaw , Thank you for taking time to come for your Medicare Wellness Visit. I appreciate your ongoing commitment to your health goals. Please review the following plan we discussed and let me know if I can assist you in the future.   Referrals/Orders/Follow-Ups/Clinician Recommendations: Remember to update your shingles and covid vaccines and get a flu shot annually.  This is a list of the screening recommended for you and due dates:  Health Maintenance  Topic Date Due   COVID-19 Vaccine (4 - 2024-25 season) 06/25/2023*   Zoster (Shingles) Vaccine (1 of 2) 07/15/2023*   Flu Shot  09/04/2023   Medicare Annual Wellness Visit  06/08/2024   Mammogram  04/13/2025   Colon Cancer Screening  07/29/2025   Pneumonia Vaccine  Completed   DEXA scan (bone density measurement)  Completed   Hepatitis C Screening  Completed   HPV Vaccine  Aged Out   Meningitis B Vaccine  Aged Out   DTaP/Tdap/Td vaccine  Discontinued  *Topic was postponed. The date shown is not the original due date.    Advanced directives: (Declined) Advance directive discussed with you today. Even though you declined this today, please call our office should you change your mind, and we can give you the proper paperwork for you to fill out.  Next Medicare Annual Wellness Visit scheduled for next year: Yes 06/13/24 @ 3:40  Have you seen your provider in the last 6 months (3 months if uncontrolled diabetes)? Yes 05/15/23

## 2023-06-10 ENCOUNTER — Telehealth: Payer: Self-pay

## 2023-06-10 ENCOUNTER — Encounter: Payer: Self-pay | Admitting: Nurse Practitioner

## 2023-06-10 NOTE — Telephone Encounter (Signed)
 Please inform pt that The chest x-ray is clear.  No infection on the x-ray and she should take both Augmentin  and prednisone .

## 2023-06-10 NOTE — Telephone Encounter (Signed)
 Patient is aware to take the Augmentin  and Prednisone  but wants to know why if she does not have infection. Patient states she is felling better since taking the medications.

## 2023-06-10 NOTE — Telephone Encounter (Signed)
 Copied from CRM [id number]. Topic: Clinical - Prescription Issue >> Jun 09, 2023  5:08 PM Chuck Crater wrote: Reason for CRM: Patient states that pharmacy doesn't have record of predniSONE  (DELTASONE ) 20 MG tablet being sent. She states that she only was able to pick up amoxicillin -clavulanate (AUGMENTIN ) 875-125 MG tablet.

## 2023-06-10 NOTE — Telephone Encounter (Signed)
 Called Patient and she stated that message is not true that she got the Augmentin  realized she was supposed to have Prednisone  so she called the Pharmacy and 15 minutes later they gave her the Prednisone . She was asking about the xray results she received last night. I let her know when I get those results I will call her.

## 2023-06-19 ENCOUNTER — Other Ambulatory Visit (INDEPENDENT_AMBULATORY_CARE_PROVIDER_SITE_OTHER): Payer: Medicare Other

## 2023-06-19 DIAGNOSIS — R739 Hyperglycemia, unspecified: Secondary | ICD-10-CM

## 2023-06-19 DIAGNOSIS — E785 Hyperlipidemia, unspecified: Secondary | ICD-10-CM | POA: Diagnosis not present

## 2023-06-19 DIAGNOSIS — I10 Essential (primary) hypertension: Secondary | ICD-10-CM | POA: Diagnosis not present

## 2023-06-19 LAB — BASIC METABOLIC PANEL WITH GFR
BUN: 9 mg/dL (ref 6–23)
CO2: 31 meq/L (ref 19–32)
Calcium: 9.4 mg/dL (ref 8.4–10.5)
Chloride: 105 meq/L (ref 96–112)
Creatinine, Ser: 0.72 mg/dL (ref 0.40–1.20)
GFR: 82.76 mL/min (ref >60.00)
Glucose, Bld: 114 mg/dL — ABNORMAL HIGH (ref 70–99)
Potassium: 4.4 meq/L (ref 3.5–5.1)
Sodium: 143 meq/L (ref 135–145)

## 2023-06-19 LAB — LIPID PANEL
Cholesterol: 187 mg/dL (ref 0–200)
HDL: 52 mg/dL (ref >39.00)
LDL Cholesterol: 102 mg/dL — ABNORMAL HIGH (ref 0–99)
NonHDL: 134.68
Total CHOL/HDL Ratio: 4
Triglycerides: 162 mg/dL — ABNORMAL HIGH (ref 0.0–149.0)
VLDL: 32.4 mg/dL (ref 0.0–40.0)

## 2023-06-19 LAB — HEPATIC FUNCTION PANEL
ALT: 14 U/L (ref 0–35)
AST: 13 U/L (ref 0–37)
Albumin: 3.9 g/dL (ref 3.5–5.2)
Alkaline Phosphatase: 58 U/L (ref 39–117)
Bilirubin, Direct: 0.2 mg/dL (ref 0.0–0.3)
Total Bilirubin: 1.1 mg/dL (ref 0.2–1.2)
Total Protein: 6.5 g/dL (ref 6.0–8.3)

## 2023-06-19 LAB — HEMOGLOBIN A1C: Hgb A1c MFr Bld: 6.5 % (ref 4.6–6.5)

## 2023-06-22 ENCOUNTER — Ambulatory Visit: Payer: Self-pay | Admitting: Internal Medicine

## 2023-06-23 ENCOUNTER — Ambulatory Visit (INDEPENDENT_AMBULATORY_CARE_PROVIDER_SITE_OTHER): Payer: Medicare Other | Admitting: Internal Medicine

## 2023-06-23 ENCOUNTER — Encounter: Payer: Self-pay | Admitting: Internal Medicine

## 2023-06-23 VITALS — BP 126/74 | HR 94 | Temp 98.0°F | Resp 16 | Ht 63.0 in | Wt 228.2 lb

## 2023-06-23 DIAGNOSIS — R053 Chronic cough: Secondary | ICD-10-CM

## 2023-06-23 DIAGNOSIS — Z1283 Encounter for screening for malignant neoplasm of skin: Secondary | ICD-10-CM

## 2023-06-23 DIAGNOSIS — R739 Hyperglycemia, unspecified: Secondary | ICD-10-CM | POA: Diagnosis not present

## 2023-06-23 DIAGNOSIS — I10 Essential (primary) hypertension: Secondary | ICD-10-CM | POA: Diagnosis not present

## 2023-06-23 DIAGNOSIS — E785 Hyperlipidemia, unspecified: Secondary | ICD-10-CM | POA: Diagnosis not present

## 2023-06-23 DIAGNOSIS — F419 Anxiety disorder, unspecified: Secondary | ICD-10-CM

## 2023-06-23 DIAGNOSIS — N3946 Mixed incontinence: Secondary | ICD-10-CM

## 2023-06-23 DIAGNOSIS — E039 Hypothyroidism, unspecified: Secondary | ICD-10-CM

## 2023-06-23 DIAGNOSIS — D6869 Other thrombophilia: Secondary | ICD-10-CM | POA: Diagnosis not present

## 2023-06-23 DIAGNOSIS — I4821 Permanent atrial fibrillation: Secondary | ICD-10-CM

## 2023-06-23 MED ORDER — APIXABAN 5 MG PO TABS: 5.0000 mg | ORAL_TABLET | Freq: Two times a day (BID) | ORAL | 3 refills | Status: AC

## 2023-06-23 MED ORDER — LEVOTHYROXINE SODIUM 50 MCG PO TABS: 50.0000 ug | ORAL_TABLET | Freq: Every day | ORAL | 3 refills | Status: AC

## 2023-06-23 MED ORDER — HYDROCHLOROTHIAZIDE 25 MG PO TABS: 25.0000 mg | ORAL_TABLET | Freq: Every day | ORAL | 3 refills | Status: AC

## 2023-06-23 MED ORDER — ATORVASTATIN CALCIUM 20 MG PO TABS: 20.0000 mg | ORAL_TABLET | Freq: Every day | ORAL | 3 refills | Status: AC

## 2023-06-23 NOTE — Assessment & Plan Note (Signed)
 Urology 12/31/21 - UA today is negative, PVR is minimal, no evidence of underlying pathology. Urinalysis. Bladder Scan (Post Void Residual) in office.  Discussed urinary symptoms.  Have previously discussed referral for PFT.

## 2023-06-23 NOTE — Progress Notes (Unsigned)
 Subjective:    Patient ID: Bonnie Shaw, female    DOB: 25-Dec-1950, 73 y.o.   MRN: [id number]  Patient here for  Chief Complaint  Patient presents with  . Medical Management of Chronic Issues    HPI Here for a scheduled follow up - follow up regarding hypercholesterolemia, afib, hyperglycemia and hypertension. Has been seeing ortho/PT for lumbar pain and pain left glute. Seeing pulmonary. Had cT scan 05/07/23 - no evidence of ILD. Has f/u with Dr Darnelle Elders to discuss CT results.    Past Medical History:  Diagnosis Date  . Anxiety   . Arthritis   . Cataract   . Complication of anesthesia    Woke up during colonoscopy and during Knee surgery. Also says "nitrous makes me crazy"  . Family history of adverse reaction to anesthesia    son woke up during TEE  . Guillain-Barre (HCC) 2018   recovered  . Hyperlipidemia   . Hypertension   . Hyperthyroidism    s/p RAI therapy  . Hypothyroidism    s/p ablation  . Left atrial enlargement    severely enlarged  . Migraine headache    2x/yr  . Motion sickness    roller coasters  . Obesity   . Panic disorder   . Persistent atrial fibrillation (HCC)   . Snoring    Past Surgical History:  Procedure Laterality Date  . ABDOMINAL HYSTERECTOMY    . CARDIOVERSION N/A 09/11/2017   Procedure: CARDIOVERSION;  Surgeon: Maudine Sos, MD;  Location: [facility] ENDOSCOPY;  Service: Cardiovascular;  Laterality: N/A;  . CATARACT EXTRACTION    . COLONOSCOPY WITH PROPOFOL  N/A 03/11/2019   Procedure: COLONOSCOPY WITH PROPOFOL ;  Surgeon: Luke Salaam, MD;  Location: [facility] SURGERY [facility];  Service: Endoscopy;  Laterality: N/A;  colon  . COLONOSCOPY WITH PROPOFOL  N/A 07/30/2022   Procedure: COLONOSCOPY WITH PROPOFOL ;  Surgeon: Toledo, Alphonsus Jeans, MD;  Location: [facility] ENDOSCOPY;  Service: Gastroenterology;  Laterality: N/A;  . KNEE SURGERY Bilateral    replacements  . POLYPECTOMY  03/11/2019   Procedure: POLYPECTOMY;  Surgeon: Luke Salaam, MD;  Location: [facility]  SURGERY [facility]TR;  Service: Endoscopy;;  . THYROID  ABLATION  2010   Family History  Problem Relation Age of Onset  . Alzheimer's disease Father   . Stroke Mother   . Cancer Sister        ovarian   . Ovarian cancer Sister 36  . Breast cancer Neg Hx    Social History   Socioeconomic History  . Marital status: Married    Spouse name: Not on file  . Number of children: 4  . Years of education: Not on file  . Highest education level: Associate degree: occupational, Scientist, product/process development, or vocational program  Occupational History    Employer: UNEMPLOYED  Tobacco Use  . Smoking status: Never  . Smokeless tobacco: Never  Vaping Use  . Vaping status: Never Used  Substance and Sexual Activity  . Alcohol use: No  . Drug use: No  . Sexual activity: Not on file  Other Topics Concern  . 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  Difficulty of Paying Living Expenses: Not hard at all  Food Insecurity: No Food Insecurity (06/09/2023)   Hunger Vital Sign   . Worried About Programme researcher, broadcasting/film/video in the Last Year: Never true   . Ran Out of Food in the Last Year: Never true  Transportation Needs: No Transportation Needs (06/09/2023)   PRAPARE - Transportation   . Lack of Transportation (Medical): No   . Lack of Transportation (Non-Medical): No  Physical Activity: Sufficiently Active (06/09/2023)   Exercise Vital Sign   . Days of Exercise per Week: 3 days   . Minutes of Exercise per Session: 50 min  Stress: No Stress Concern Present (06/09/2023)   [employer] of Occupational Health - Occupational Stress Questionnaire   . Feeling of Stress : Only a little  Social Connections: Moderately  Integrated (06/09/2023)   Social Connection and Isolation Panel [NHANES]   . Frequency of Communication with Friends and Family: More than three times a week   . Frequency of Social Gatherings with Friends and Family: More than three times a week   . Attends Religious Services: More than 4 times per year   . Active Member of Clubs or Organizations: No   . Attends Banker Meetings: Never   . Marital Status: Married     Review of Systems     Objective:     BP 126/74   Pulse 94   Temp 98 F (36.7 C)   Resp 16   Ht 5\' 3"  (1.6 m)   Wt 228 lb 3.2 oz (103.5 kg)   SpO2 97%   BMI 40.42 kg/m  Wt Readings from Last 3 Encounters:  06/23/23 228 lb 3.2 oz (103.5 kg)  06/09/23 224 lb 9.6 oz (101.9 kg)  06/09/23 225 lb (102.1 kg)    Physical Exam  {Perform Simple Foot Exam  Perform Detailed exam:1} {Insert foot Exam (Optional):30965}   Outpatient Encounter Medications as of 06/23/2023  Medication Sig  . ALPRAZolam  (XANAX ) 0.5 MG tablet Take 1 tablet (0.5 mg total) by mouth once as needed for up to 1 dose (for anxiety associated with undegoing CT scan of the chest.).  . atorvastatin  (LIPITOR) 20 MG tablet TAKE 1 TABLET BY MOUTH ONCE  DAILY  . cholecalciferol  (VITAMIN D3) 25 MCG (1000 UT) tablet Take 1,000 Units by mouth daily. Taking 2,000mg  daily  . Cyanocobalamin  (VITAMIN B 12 PO) Take by mouth daily.  . ELIQUIS  5 MG TABS tablet TAKE 1 TABLET BY MOUTH TWICE  DAILY  . hydrochlorothiazide  (HYDRODIURIL ) 25 MG tablet TAKE 1 TABLET BY MOUTH DAILY  . levothyroxine  (SYNTHROID ) 50 MCG tablet Take 1 tablet (50 mcg total) by mouth daily.  . metoprolol  tartrate (LOPRESSOR ) 25 MG tablet Take 1 tablet (25 mg total) by mouth 2 (two) times daily.  . potassium chloride  (KLOR-CON ) 10 MEQ tablet Take 2 tablets (20 mEq total) by mouth daily. TAKE 1 TABLET BY MOUTH  TWICE DAILY  . sertraline  (ZOLOFT ) 50 MG tablet TAKE 1 TABLET BY MOUTH DAILY  . [DISCONTINUED] amoxicillin -clavulanate  (AUGMENTIN ) 875-125 MG tablet Take 1 tablet by mouth 2 (two) times daily.  . [DISCONTINUED] promethazine -dextromethorphan (PROMETHAZINE -DM) 6.25-15 MG/5ML syrup Take 5 mLs by mouth 3 (three) times daily as needed for cough.   No facility-administered encounter medications on file as of 06/23/2023.     Lab Results  Component Value Date   WBC 7.4 01/13/2023   HGB 14.3 01/13/2023   HCT 42.3 01/13/2023   PLT 252.0 01/13/2023   GLUCOSE  114 (H) 06/19/2023   CHOL 187 06/19/2023   TRIG 162.0 (H) 06/19/2023   HDL 52.00 06/19/2023   LDLDIRECT 97.0 11/01/2018   LDLCALC 102 (H) 06/19/2023   ALT 14 06/19/2023   AST 13 06/19/2023   NA 143 06/19/2023   K 4.4 06/19/2023   CL 105 06/19/2023   CREATININE 0.72 06/19/2023   BUN 9 06/19/2023   CO2 31 06/19/2023   TSH 2.04 02/19/2023   INR 1.3 (H) 12/19/2022   HGBA1C 6.5 06/19/2023   MICROALBUR <0.7 06/11/2015    DG Chest 2 View Result Date: 06/09/2023 CLINICAL DATA:  Cough and wheezing for 1 week. EXAM: CHEST - 2 VIEW COMPARISON:  04/14/2023. FINDINGS: Bilateral lung fields are clear. Bilateral costophrenic angles are clear. Note is made of elevated right hemidiaphragm. Normal cardio-mediastinal silhouette. No acute osseous abnormalities. The soft tissues are within normal limits. IMPRESSION: No active cardiopulmonary disease. Electronically Signed   By: Beula Brunswick M.D.   On: 06/09/2023 16:52       Assessment & Plan:  Hyperlipidemia, unspecified hyperlipidemia type Assessment & Plan: The 10-year ASCVD risk score (Arnett DK, et al., 2019) is: 18.5%   Values used to calculate the score:     Age: 40 years     Sex: Female     Is Non-Hispanic African American: No     Diabetic: No     Tobacco smoker: No     Systolic Blood Pressure: 134 mmHg     Is BP treated: Yes     HDL Cholesterol: 52 mg/dL     Total Cholesterol: 187 mg/dL  Low cholesterol diet and exercise. Taking lipitor. Follow lipid panel.    Hyperglycemia  Primary  hypertension  Mixed stress and urge urinary incontinence Assessment & Plan: Urology 12/31/21 - UA today is negative, PVR is minimal, no evidence of underlying pathology. Urinalysis. Bladder Scan (Post Void Residual) in office.  Discussed urinary symptoms.  Have discussed referral for PFT.        Dellar Fenton, MD

## 2023-06-23 NOTE — Assessment & Plan Note (Signed)
 The 10-year ASCVD risk score (Arnett DK, et al., 2019) is: 18.5%   Values used to calculate the score:     Age: 73 years     Sex: Female     Is Non-Hispanic African American: No     Diabetic: No     Tobacco smoker: No     Systolic Blood Pressure: 134 mmHg     Is BP treated: Yes     HDL Cholesterol: 52 mg/dL     Total Cholesterol: 187 mg/dL  Low cholesterol diet and exercise. Taking lipitor. Follow lipid panel. No changes in medication today.

## 2023-06-24 DIAGNOSIS — R051 Acute cough: Secondary | ICD-10-CM | POA: Insufficient documentation

## 2023-06-24 NOTE — Assessment & Plan Note (Addendum)
 Vital signs stable, patient in is no sign of acute distress, nontoxic.  Discussed findings with the patient.  Symptoms present for a weeks with no sign of resolution, will treat with Augmentin  and  prednisone . Increase fluid intake, rest. Chest Xray normal. Will let us  know if symptoms not improving

## 2023-06-25 ENCOUNTER — Ambulatory Visit (INDEPENDENT_AMBULATORY_CARE_PROVIDER_SITE_OTHER): Admitting: Student in an Organized Health Care Education/Training Program

## 2023-06-25 ENCOUNTER — Encounter: Payer: Self-pay | Admitting: Internal Medicine

## 2023-06-25 ENCOUNTER — Encounter: Payer: Self-pay | Admitting: Student in an Organized Health Care Education/Training Program

## 2023-06-25 VITALS — BP 134/88 | HR 98 | Ht 63.0 in | Wt 228.0 lb

## 2023-06-25 DIAGNOSIS — R053 Chronic cough: Secondary | ICD-10-CM

## 2023-06-25 DIAGNOSIS — I719 Aortic aneurysm of unspecified site, without rupture: Secondary | ICD-10-CM | POA: Insufficient documentation

## 2023-06-25 DIAGNOSIS — I7121 Aneurysm of the ascending aorta, without rupture: Secondary | ICD-10-CM | POA: Diagnosis not present

## 2023-06-25 NOTE — Progress Notes (Signed)
 Synopsis: Referred in for chronic cough by Dellar Fenton, MD  Assessment & Plan:   #Chronic Cough  Pleasant 73 year old female presenting for the evaluation of chronic cough that started in September with subsequent resolution. She's had a workup that has included a HRCT that shows 3 mm nodules but no sign of ILD or any other parenchymal lung disease. Her double contrast esophagogram shows a hiatal hernia and signs of reflux, and I have counseled Bonnie Shaw that her symptoms are likely driven by reflux associated cough. Given she does not have signs of heart burn, we will continue conservative management with dietary modifications.   -continue with GERD consistent diet -repeat CT chest not required  #Ascending Aortic Aneurysm  Incidentally found to have a 4 cm ascending aortic aneurysm for which radiographic surveillance is recommended. I have offered the patient a referral to cardiothoracic surgery but she declined, and would prefer to continue surveillance with PCP.  -yearly radiographic surveillance    Return if symptoms worsen or fail to improve.  I spent 30 minutes caring for this patient today, including preparing to see the patient, obtaining a medical history , reviewing a separately obtained history, performing a medically appropriate examination and/or evaluation, counseling and educating the patient/family/caregiver, documenting clinical information in the electronic health record, and independently interpreting results (not separately reported/billed) and communicating results to the patient/family/caregiver  Vergia Glasgow, MD [facility]   End of visit medications:  No orders of the defined types were placed in this encounter.    Current Outpatient Medications:    ALPRAZolam  (XANAX ) 0.5 MG tablet, Take 1 tablet (0.5 mg total) by mouth once as needed for up to 1 dose (for anxiety associated with undegoing CT scan of the chest.)., Disp: 1 tablet,  Rfl: 0   apixaban  (ELIQUIS ) 5 MG TABS tablet, Take 1 tablet (5 mg total) by mouth 2 (two) times daily., Disp: 180 tablet, Rfl: 3   atorvastatin  (LIPITOR) 20 MG tablet, Take 1 tablet (20 mg total) by mouth daily., Disp: 90 tablet, Rfl: 3   cholecalciferol  (VITAMIN D3) 25 MCG (1000 UT) tablet, Take 1,000 Units by mouth daily. Taking 2,000mg  daily, Disp: , Rfl:    Cyanocobalamin  (VITAMIN B 12 PO), Take by mouth daily., Disp: , Rfl:    hydrochlorothiazide  (HYDRODIURIL ) 25 MG tablet, Take 1 tablet (25 mg total) by mouth daily., Disp: 90 tablet, Rfl: 3   levothyroxine  (SYNTHROID ) 50 MCG tablet, Take 1 tablet (50 mcg total) by mouth daily., Disp: 90 tablet, Rfl: 3   metoprolol  tartrate (LOPRESSOR ) 25 MG tablet, Take 1 tablet (25 mg total) by mouth 2 (two) times daily., Disp: 180 tablet, Rfl: 3   potassium chloride  (KLOR-CON ) 10 MEQ tablet, Take 2 tablets (20 mEq total) by mouth daily. TAKE 1 TABLET BY MOUTH  TWICE DAILY, Disp: 60 tablet, Rfl: 0   sertraline  (ZOLOFT ) 50 MG tablet, TAKE 1 TABLET BY MOUTH DAILY, Disp: 90 tablet, Rfl: 3   Subjective:   PATIENT ID: Bonnie Shaw GENDER: female DOB: [birth year], MRN: [id number]  Chief Complaint  Patient presents with   Medical Management of Chronic Issues    Feeling fine   HPI  Patient is a pleasant 73 year old female presenting to clinic for follow up of cough.  She feels much better today and feels that her cough is nearly resolved.  She did not feel that the cough was significantly bothersome to her.  She does not report signs of reflux disease.  There is no sputum  production, no hemoptysis, no shortness of breath, and no wheeze. Patient was seen in urgent care earlier in the month for an upper respiratory tract infection secondary to a viral illness resulting in some increase in the cough.  This is similarly resolved.   Initial visit 04/21/2023:  She feels that symptoms started around September 2024 after contracting COVID.  She developed a cough  that was productive of sputum with associated shortness of breath.  Cough persisted and she was subsequently diagnosed with pneumonia and treated with antibiotics and received a course of steroids.  Symptoms persisted prompting follow-up with primary care physician where chest x-ray was obtained and noted to be somewhat abnormal with increased bronchial thickening, cardiomegaly, and hiatal hernia.  She has had 3 courses of antibiotics without full resolution in her symptoms.  Patient has not noticed any worsening of cough at night nor associated fevers, chills, night sweats, or weight loss.  She actually feels much better today and feels that the cough is improved over the past few days. She does report a history of reflux but has not noticed any worsening in that regard recently.   She has had her swallowing study as well as high-resolution chest CT and is presenting to discuss results.  She has not had PFTs.  Patient denies any smoking or occupational exposures.  She does report some secondhand smoke exposure in her past.    Past medical history is significant for atrial fibrillation, hypothyroidism, hypertension, previous Guillain-Barr syndrome, and anxiety.  Ancillary information including prior medications, full medical/surgical/family/social histories, and PFTs (when available) are listed below and have been reviewed.   Review of Systems  Constitutional:  Negative for chills, fever and weight loss.  Respiratory:  Negative for cough, hemoptysis, sputum production, shortness of breath and wheezing.   Cardiovascular:  Negative for chest pain and palpitations.     Objective:   Vitals:   06/25/23 1258  BP: 134/88  Pulse: 98  SpO2: 99%  Weight: 228 lb (103.4 kg)  Height: 5\' 3"  (1.6 m)   99% on RA BMI Readings from Last 3 Encounters:  06/25/23 40.39 kg/m  06/23/23 40.42 kg/m  06/09/23 39.79 kg/m   Wt Readings from Last 3 Encounters:  06/25/23 228 lb (103.4 kg)  06/23/23 228 lb  3.2 oz (103.5 kg)  06/09/23 224 lb 9.6 oz (101.9 kg)    Physical Exam Constitutional:      Appearance: Normal appearance. She is not ill-appearing.  Cardiovascular:     Rate and Rhythm: Normal rate and regular rhythm.     Pulses: Normal pulses.     Heart sounds: Normal heart sounds.  Pulmonary:     Effort: Pulmonary effort is normal. No respiratory distress.     Breath sounds: Normal breath sounds. No wheezing or rales.  Abdominal:     Palpations: Abdomen is soft.  Musculoskeletal:     Right lower leg: No edema.     Left lower leg: No edema.  Neurological:     General: No focal deficit present.     Mental Status: She is alert and oriented to person, place, and time. Mental status is at baseline.       Ancillary Information    Past Medical History:  Diagnosis Date   Anxiety    Arthritis    Cataract    Complication of anesthesia    Woke up during colonoscopy and during Knee surgery. Also says "nitrous makes me crazy"   Family history of adverse reaction to anesthesia  son woke up during TEE   Guillain-Barre [facility]) 2018   recovered   Hyperlipidemia    Hypertension    Hyperthyroidism    s/p RAI therapy   Hypothyroidism    s/p ablation   Left atrial enlargement    severely enlarged   Migraine headache    2x/yr   Motion sickness    roller coasters   Obesity    Panic disorder    Persistent atrial fibrillation (HCC)    Snoring      Family History  Problem Relation Age of Onset   Alzheimer's disease Father    Stroke Mother    Cancer Sister        ovarian    Ovarian cancer Sister 46   Breast cancer Neg Hx      Past Surgical History:  Procedure Laterality Date   ABDOMINAL HYSTERECTOMY     CARDIOVERSION N/A 09/11/2017   Procedure: CARDIOVERSION;  Surgeon: Maudine Sos, MD;  Location: [facility] ENDOSCOPY;  Service: Cardiovascular;  Laterality: N/A;   CATARACT EXTRACTION     COLONOSCOPY WITH PROPOFOL  N/A 03/11/2019   Procedure: COLONOSCOPY WITH PROPOFOL ;   Surgeon: Luke Salaam, MD;  Location: [facility];  Service: Endoscopy;  Laterality: N/A;  colon   COLONOSCOPY WITH PROPOFOL  N/A 07/30/2022   Procedure: COLONOSCOPY WITH PROPOFOL ;  Surgeon: Toledo, Alphonsus Jeans, MD;  Location: [facility] ENDOSCOPY;  Service: Gastroenterology;  Laterality: N/A;   KNEE SURGERY Bilateral    replacements   POLYPECTOMY  03/11/2019   Procedure: POLYPECTOMY;  Surgeon: Luke Salaam, MD;  Location: [facility];  Service: Endoscopy;;   THYROID  ABLATION  2010    Social History   Socioeconomic History   Marital status: Married    Spouse name: Not on file   Number of children: 4   Years of education: Not on file   Highest education level: Associate degree: occupational, Scientist, product/process development, or vocational program  Occupational History    Employer: UNEMPLOYED  Tobacco Use   Smoking status: Never   Smokeless tobacco: Never  Vaping Use   Vaping status: Never Used  Substance and Sexual Activity   Alcohol use: No   Drug use: No   Sexual activity: Not on file  Other Topics Concern   Not on file  Social History Narrative   L[place] in [place] with husband, has 4 children and 15 grandkids         Attends [employer], 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[employer]   Retired [employer]   Social Drivers of Corporate investment banker Strain: Low Risk  (06/09/2023)   Overall Financial Resource Strain (CARDIA)    Difficulty of Paying Living Expenses: Not hard at all  Food Insecurity: No Food Insecurity (06/09/2023)   Hunger Vital Sign    Worried About Running Out of Food in the Last Year: Never true    Ran Out of Food in the Last Year: Never true  Transportation Needs: No Transportation Needs (06/09/2023)   PRAPARE - Administrator, Civil Service (Medical): No    Lack of Transportation (Non-Medical): No  Physical Activity: Sufficiently Active (06/09/2023)   Exercise Vital Sign    Days of Exercise per Week: 3 days    Minutes of Exercise  per Session: 50 min  Stress: No Stress Concern Present (06/09/2023)   [employer] of Occupational Health - Occupational Stress Questionnaire    Feeling of Stress : Only a little  Social  Connections: Moderately Integrated (06/09/2023)   Social Connection and Isolation Panel [NHANES]    Frequency of Communication with Friends and Family: More than three times a week    Frequency of Social Gatherings with Friends and Family: More than three times a week    Attends Religious Services: More than 4 times per year    Active Member of Clubs or Organizations: No    Attends Banker Meetings: Never    Marital Status: Married  Catering manager Violence: Not At Risk (06/09/2023)   Humiliation, Afraid, Rape, and Kick questionnaire    Fear of Current or Ex-Partner: No    Emotionally Abused: No    Physically Abused: No    Sexually Abused: No     Allergies  Allergen Reactions   Ativan  [Lorazepam ] Other (See Comments)    High sensitivity, cause pt to pass out   Valium [Diazepam] Other (See Comments)    Hallucinations, tremors, slurred speech   Meloxicam Other (See Comments)    Hallucinations   Trimethoprim Other (See Comments)   Sulfamethoxazole Rash   Sulfonamide Derivatives Rash     CBC    Component Value Date/Time   WBC 7.4 01/13/2023 1520   RBC 4.40 01/13/2023 1520   HGB 14.3 01/13/2023 1520   HGB 14.8 12/01/2013 0312   HCT 42.3 01/13/2023 1520   HCT 43.9 12/01/2013 0312   PLT 252.0 01/13/2023 1520   PLT 215 12/01/2013 0312   MCV 96.3 01/13/2023 1520   MCV 93 12/01/2013 0312   MCH 32.3 12/21/2022 0355   MCHC 33.8 01/13/2023 1520   RDW 14.3 01/13/2023 1520   RDW 13.7 12/01/2013 0312   LYMPHSABS 3.0 01/13/2023 1520   MONOABS 0.6 01/13/2023 1520   EOSABS 0.1 01/13/2023 1520   BASOSABS 0.1 01/13/2023 1520    Pulmonary Functions Testing Results:     No data to display          Outpatient Medications Prior to Visit  Medication Sig Dispense Refill    ALPRAZolam  (XANAX ) 0.5 MG tablet Take 1 tablet (0.5 mg total) by mouth once as needed for up to 1 dose (for anxiety associated with undegoing CT scan of the chest.). 1 tablet 0   apixaban  (ELIQUIS ) 5 MG TABS tablet Take 1 tablet (5 mg total) by mouth 2 (two) times daily. 180 tablet 3   atorvastatin  (LIPITOR) 20 MG tablet Take 1 tablet (20 mg total) by mouth daily. 90 tablet 3   cholecalciferol  (VITAMIN D3) 25 MCG (1000 UT) tablet Take 1,000 Units by mouth daily. Taking 2,000mg  daily     Cyanocobalamin  (VITAMIN B 12 PO) Take by mouth daily.     hydrochlorothiazide  (HYDRODIURIL ) 25 MG tablet Take 1 tablet (25 mg total) by mouth daily. 90 tablet 3   levothyroxine  (SYNTHROID ) 50 MCG tablet Take 1 tablet (50 mcg total) by mouth daily. 90 tablet 3   metoprolol  tartrate (LOPRESSOR ) 25 MG tablet Take 1 tablet (25 mg total) by mouth 2 (two) times daily. 180 tablet 3   potassium chloride  (KLOR-CON ) 10 MEQ tablet Take 2 tablets (20 mEq total) by mouth daily. TAKE 1 TABLET BY MOUTH  TWICE DAILY 60 tablet 0   sertraline  (ZOLOFT ) 50 MG tablet TAKE 1 TABLET BY MOUTH DAILY 90 tablet 3   No facility-administered medications prior to visit.

## 2023-06-29 ENCOUNTER — Encounter: Payer: Self-pay | Admitting: Internal Medicine

## 2023-06-29 DIAGNOSIS — Z1283 Encounter for screening for malignant neoplasm of skin: Secondary | ICD-10-CM | POA: Insufficient documentation

## 2023-06-29 NOTE — Assessment & Plan Note (Signed)
 Continue eliquis  and metoprolol . Stable.

## 2023-06-29 NOTE — Assessment & Plan Note (Signed)
Afib.  On eliquis.  

## 2023-06-29 NOTE — Assessment & Plan Note (Signed)
 On thyroid replacement.  Follow tsh.

## 2023-06-29 NOTE — Assessment & Plan Note (Signed)
 Request referral to dermatology for sking check.

## 2023-06-29 NOTE — Assessment & Plan Note (Signed)
 Continue zoloft . Overall appears to be handling things relatively well. Appears to be doing better. Follow.

## 2023-06-29 NOTE — Assessment & Plan Note (Signed)
 Low-carb diet and exercise.  Follow met b and A1c.

## 2023-06-29 NOTE — Assessment & Plan Note (Signed)
 Seeing pulmonary. Had cT scan 05/07/23 - no evidence of ILD. Has f/u with Dr Darnelle Elders to discuss CT results.

## 2023-06-29 NOTE — Assessment & Plan Note (Signed)
 Blood pressure as outlined.  On hydrochlorothiazide  and metoprolol . Follow pressures. Follow metabolic panel. No changes in medication today.

## 2023-07-30 ENCOUNTER — Encounter: Payer: Self-pay | Admitting: Internal Medicine

## 2023-07-30 DIAGNOSIS — M545 Low back pain, unspecified: Secondary | ICD-10-CM | POA: Insufficient documentation

## 2023-08-12 ENCOUNTER — Encounter: Payer: Self-pay | Admitting: Emergency Medicine

## 2023-08-12 ENCOUNTER — Telehealth: Payer: Self-pay | Admitting: Internal Medicine

## 2023-08-12 ENCOUNTER — Ambulatory Visit
Admission: EM | Admit: 2023-08-12 | Discharge: 2023-08-12 | Disposition: A | Discharge status: Home / Self Care | Attending: Emergency Medicine | Admitting: Emergency Medicine

## 2023-08-12 ENCOUNTER — Ambulatory Visit (INDEPENDENT_AMBULATORY_CARE_PROVIDER_SITE_OTHER)

## 2023-08-12 ENCOUNTER — Ambulatory Visit

## 2023-08-12 DIAGNOSIS — R0781 Pleurodynia: Secondary | ICD-10-CM

## 2023-08-12 MED ORDER — DICLOFENAC SODIUM 1 % EX GEL: 2.0000 g | Freq: Four times a day (QID) | CUTANEOUS | 1 refills | Status: AC

## 2023-08-12 NOTE — Discharge Instructions (Signed)
 X-ray pending, you will be notified of results via telephone  You may continue to wear sports bras which are compression which will provide stability and support to the area, if not tight enough you may double the bra or purchase a rib belt offline  May use diclofenac  gel every 6 hours as needed over the affected area this helps to reduce inflammation and helps with pain, may use in addition to any other topical medicine such as lidocaine   May continue to apply ice over the affected area 10 to 15-minute intervals  Whenever sitting and lying cushion the body with pillows  If there is a break in the bone we allow this to heal naturally which may take 1 to 3 months to fully do so but she should not experience pain for the entire timeframe of healing

## 2023-08-12 NOTE — ED Provider Notes (Signed)
 Bonnie Shaw    CSN: [id number] Arrival date & time: 08/12/23  1238      History   Chief Complaint Chief Complaint  Patient presents with   Rib Injury    HPI Bonnie Shaw is a 73 y.o. female.   Patient presents for evaluation of right sided rib pain beginning 3 days ago after episode of sneezing.  Endorses that she had a fall approximately 5 days ago, got tripped up while walking throughout her house landing on the hands and the breast.  Endorses bruising to the forearms which has resolved.  Endorses pain is exacerbated by standing, movement of the right arm and movement of the trunk of the body.  Has been applying ice over the affected area which has been helpful.  Denies shortness of breath or any difficulty breathing.  Past Medical History:  Diagnosis Date   Anxiety    Arthritis    Cataract    Complication of anesthesia    Woke up during colonoscopy and during Knee surgery. Also says nitrous makes me crazy   Family history of adverse reaction to anesthesia    son woke up during TEE   Guillain-Barre (HCC) 2018   recovered   Hyperlipidemia    Hypertension    Hyperthyroidism    s/p RAI therapy   Hypothyroidism    s/p ablation   Left atrial enlargement    severely enlarged   Migraine headache    2x/yr   Motion sickness    roller coasters   Obesity    Panic disorder    Persistent atrial fibrillation (HCC)    Snoring     Patient Active Problem List   Diagnosis Date Noted   Low back pain 07/30/2023   Skin cancer screening 06/29/2023   Aortic aneurysm (HCC) 06/25/2023   Acute cough 06/24/2023   Taste disorder 05/17/2023   Persistent cough 04/19/2023   Abnormal CXR 04/18/2023   Acute non-recurrent pansinusitis 02/23/2023   B12 deficiency 02/19/2023   Dysuria 01/03/2023   Anemia 12/30/2022   Hypoxia 12/20/2022   Hypokalemia 12/19/2022   Chronic atrial fibrillation with RVR (HCC) 12/19/2022   Anxiety and depression 12/19/2022   Dyslipidemia  12/19/2022   Community acquired pneumonia 11/14/2022   Thumb pain 07/15/2022   Chest pain 03/13/2022   Urinary urgency 03/13/2022   Diarrhea 02/04/2022   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/31/2021   Pre-op evaluation 11/20/2021   Bunion 04/30/2021   Pancreatic cyst 04/30/2021   Joint ache 01/29/2021   Acquired thrombophilia (HCC) 07/13/2020   Severe obesity (BMI >= 40) (HCC) 07/13/2020   Change in bowel movement 11/13/2019   Hyperbilirubinemia 11/13/2019   Colon cancer screening 02/20/2019   Near syncope 09/27/2017   Hyperglycemia 04/26/2017   History of Guillain-Barre syndrome 09/06/2016   Orthostatic hypotension 07/09/2016   Vitamin D  deficiency 12/30/2015   Healthcare maintenance 09/12/2015   Atrial fibrillation (HCC) 12/02/2013   Anxiety 12/02/2013   Health care maintenance 04/28/2013   Hyperlipidemia 02/23/2012   Generalized weakness 08/22/2011   Hypothyroidism 02/20/2011   Hypertension 02/20/2011    Past Surgical History:  Procedure Laterality Date   ABDOMINAL HYSTERECTOMY     CARDIOVERSION N/A 09/11/2017   Procedure: CARDIOVERSION;  Surgeon: Raford Riggs, MD;  Location: [facility] ENDOSCOPY;  Service: Cardiovascular;  Laterality: N/A;   CATARACT EXTRACTION     COLONOSCOPY WITH PROPOFOL  N/A 03/11/2019   Procedure: COLONOSCOPY WITH PROPOFOL ;  Surgeon: Therisa Bi, MD;  Location: [facility];  Service: Endoscopy;  Laterality: N/A;  colon   COLONOSCOPY WITH PROPOFOL  N/A 07/30/2022   Procedure: COLONOSCOPY WITH PROPOFOL ;  Surgeon: Toledo, Ladell POUR, MD;  Location: [facility] ENDOSCOPY;  Service: Gastroenterology;  Laterality: N/A;   KNEE SURGERY Bilateral    replacements   POLYPECTOMY  03/11/2019   Procedure: POLYPECTOMY;  Surgeon: Therisa Bi, MD;  Location: [facility];  Service: Endoscopy;;   THYROID  ABLATION  2010    OB History   No obstetric history on file.      Home Medications    Prior to Admission medications   Medication Sig Start Date  End Date Taking? Authorizing Provider  diclofenac  Sodium (VOLTAREN ) 1 % GEL Apply 2 g topically 4 (four) times daily. 08/12/23  Yes Pecola Haxton, Shelba SAUNDERS, NP  ALPRAZolam  (XANAX ) 0.5 MG tablet Take 1 tablet (0.5 mg total) by mouth once as needed for up to 1 dose (for anxiety associated with undegoing CT scan of the chest.). 04/22/23   Isadora Hose, MD  apixaban  (ELIQUIS ) 5 MG TABS tablet Take 1 tablet (5 mg total) by mouth 2 (two) times daily. 06/23/23   Glendia Shad, MD  atorvastatin  (LIPITOR) 20 MG tablet Take 1 tablet (20 mg total) by mouth daily. 06/23/23   Glendia Shad, MD  cholecalciferol  (VITAMIN D3) 25 MCG (1000 UT) tablet Take 1,000 Units by mouth daily. Taking 2,000mg  daily    [provider]  Cyanocobalamin  (VITAMIN B 12 PO) Take by mouth daily.    [provider]  hydrochlorothiazide  (HYDRODIURIL ) 25 MG tablet Take 1 tablet (25 mg total) by mouth daily. 06/23/23   Glendia Shad, MD  levothyroxine  (SYNTHROID ) 50 MCG tablet Take 1 tablet (50 mcg total) by mouth daily. 06/23/23   Glendia Shad, MD  metoprolol  tartrate (LOPRESSOR ) 25 MG tablet Take 1 tablet (25 mg total) by mouth 2 (two) times daily. 04/14/23   Glendia Shad, MD  potassium chloride  (KLOR-CON ) 10 MEQ tablet Take 2 tablets (20 mEq total) by mouth daily. TAKE 1 TABLET BY MOUTH  TWICE DAILY 12/21/22   Amin, Ankit C, MD  sertraline  (ZOLOFT ) 50 MG tablet TAKE 1 TABLET BY MOUTH DAILY 09/23/22   Glendia Shad, MD    Family History Family History  Problem Relation Age of Onset   Alzheimer's disease Father    Stroke Mother    Cancer Sister        ovarian    Ovarian cancer Sister 90   Breast cancer Neg Hx     Social History Social History   Tobacco Use   Smoking status: Never   Smokeless tobacco: Never  Vaping Use   Vaping status: Never Used  Substance Use Topics   Alcohol use: No   Drug use: No     Allergies   Ativan  [lorazepam ], Valium [diazepam], Meloxicam, Trimethoprim, Sulfamethoxazole,  and Sulfonamide derivatives   Review of Systems Review of Systems   Physical Exam Triage Vital Signs ED Triage Vitals  Encounter Vitals Group     BP 08/12/23 1313 104/74     Girls Systolic BP Percentile --      Girls Diastolic BP Percentile --      Boys Systolic BP Percentile --      Boys Diastolic BP Percentile --      Pulse Rate 08/12/23 1313 (!) 101     Resp 08/12/23 1313 20     Temp 08/12/23 1313 97.7 F (36.5 C)     Temp Source 08/12/23 1313 Oral     SpO2 08/12/23 1313 96 %  Weight --      Height --      Head Circumference --      Peak Flow --      Pain Score 08/12/23 1315 8     Pain Loc --      Pain Education --      Exclude from Growth Chart --    No data found.  Updated Vital Signs BP 104/74 (BP Location: Right Arm)   Pulse 76   Temp 97.7 F (36.5 C) (Oral)   Resp 20   SpO2 96%   Visual Acuity Right Eye Distance:   Left Eye Distance:   Bilateral Distance:    Right Eye Near:   Left Eye Near:    Bilateral Near:     Physical Exam Constitutional:      Appearance: Normal appearance.  Eyes:     Extraocular Movements: Extraocular movements intact.  Cardiovascular:     Rate and Rhythm: Normal rate and regular rhythm.     Pulses: Normal pulses.     Heart sounds: Normal heart sounds.  Pulmonary:     Effort: Pulmonary effort is normal.     Breath sounds: Normal breath sounds.  Chest:     Comments: Tenderness is present to the right flank approximately over ribs 5 and 6, no ecchymosis swelling or deformity, chest wall symmetrical Neurological:     Mental Status: She is alert and oriented to person, place, and time. Mental status is at baseline.      UC Treatments / Results  Labs (all labs ordered are listed, but only abnormal results are displayed) Labs Reviewed - No data to display  EKG   Radiology DG Ribs Unilateral W/Chest Right Result Date: 08/12/2023 CLINICAL DATA:  Right-sided rib pain after fall 8 days ago. EXAM: RIGHT RIBS AND  CHEST - 3+ VIEW COMPARISON:  06/09/2023. FINDINGS: No acute osseous abnormality is identified. No obvious displaced rib fracture. Redemonstrated scoliotic curvature of the thoracolumbar spine. There is no evidence of pneumothorax or pleural effusion. Both lungs are clear. Heart size and mediastinal contours are unchanged. IMPRESSION: 1. No obvious displaced rib fracture. If there is persistent clinical concern for occult rib fracture a CT of the chest could be obtained for further evaluation. 2. No acute cardiopulmonary findings. Electronically Signed   By: Harrietta Sherry M.D.   On: 08/12/2023 13:35    Procedures Procedures (including critical care time)  Medications Ordered in UC Medications - No data to display  Initial Impression / Assessment and Plan / UC Course  I have reviewed the triage vital signs and the nursing notes.  Pertinent labs & imaging results that were available during my care of the patient were reviewed by me and considered in my medical decision making (see chart for details).  Rib pain on right side  X-ray negative, discussed findings with patient prior to discharge, declined prednisone  course, prescribed diclofenac  gel, unable to use NSAIDs due to Eliquis , recommended supportive care, using sports bra for compression, advised to continue Final Clinical Impressions(s) / UC Diagnoses   Final diagnoses:  Rib pain on right side     Discharge Instructions      X-ray pending, you will be notified of results via telephone  You may continue to wear sports bras which are compression which will provide stability and support to the area, if not tight enough you may double the bra or purchase a rib belt offline  May use diclofenac  gel every 6 hours as needed over  the affected area this helps to reduce inflammation and helps with pain, may use in addition to any other topical medicine such as lidocaine   May continue to apply ice over the affected area 10 to 15-minute  intervals  Whenever sitting and lying cushion the body with pillows  If there is a break in the bone we allow this to heal naturally which may take 1 to 3 months to fully do so but she should not experience pain for the entire timeframe of healing   ED Prescriptions     Medication Sig Dispense Auth. Provider   diclofenac  Sodium (VOLTAREN ) 1 % GEL Apply 2 g topically 4 (four) times daily. 50 g Teresa Shelba SAUNDERS, NP      PDMP not reviewed this encounter.   Teresa Shelba SAUNDERS, NP 08/12/23 1358

## 2023-08-12 NOTE — Telephone Encounter (Signed)
 Pt was in office, dropped off a note for [place]. That note is in Dr Freda color folder up front -[facility]

## 2023-08-13 ENCOUNTER — Other Ambulatory Visit: Payer: Self-pay

## 2023-08-13 MED ORDER — SERTRALINE HCL 50 MG PO TABS: ORAL_TABLET | ORAL | 3 refills | Status: AC

## 2023-08-13 NOTE — Telephone Encounter (Signed)
 Note was requesting refill for zoloft  50 mg q day. She was going to try to taper off and decided not to. Refill has been sent to mail order per her request. Patient is aware.

## 2023-09-04 ENCOUNTER — Other Ambulatory Visit: Payer: Self-pay | Admitting: Internal Medicine

## 2023-09-08 NOTE — Telephone Encounter (Signed)
 Rx ok'd for potassium.

## 2023-10-27 ENCOUNTER — Other Ambulatory Visit

## 2023-10-29 ENCOUNTER — Ambulatory Visit: Admitting: Internal Medicine

## 2023-11-03 ENCOUNTER — Encounter: Payer: Self-pay | Admitting: Internal Medicine

## 2023-11-03 ENCOUNTER — Ambulatory Visit (INDEPENDENT_AMBULATORY_CARE_PROVIDER_SITE_OTHER)

## 2023-11-03 ENCOUNTER — Encounter: Payer: Self-pay | Admitting: Podiatry

## 2023-11-03 ENCOUNTER — Ambulatory Visit

## 2023-11-03 ENCOUNTER — Ambulatory Visit (INDEPENDENT_AMBULATORY_CARE_PROVIDER_SITE_OTHER): Admitting: Podiatry

## 2023-11-03 DIAGNOSIS — M19071 Primary osteoarthritis, right ankle and foot: Secondary | ICD-10-CM

## 2023-11-03 DIAGNOSIS — M7751 Other enthesopathy of right foot: Secondary | ICD-10-CM

## 2023-11-03 DIAGNOSIS — M7752 Other enthesopathy of left foot: Secondary | ICD-10-CM

## 2023-11-03 DIAGNOSIS — M19072 Primary osteoarthritis, left ankle and foot: Secondary | ICD-10-CM | POA: Diagnosis not present

## 2023-11-03 DIAGNOSIS — M19079 Primary osteoarthritis, unspecified ankle and foot: Secondary | ICD-10-CM | POA: Insufficient documentation

## 2023-11-03 MED ORDER — BETAMETHASONE SOD PHOS & ACET 6 (3-3) MG/ML IJ SUSP
3.0000 mg | Freq: Once | INTRAMUSCULAR | Status: AC
  Administered 2023-11-03: 3 mg via INTRA_ARTICULAR

## 2023-11-03 NOTE — Progress Notes (Signed)
 Chief Complaint  Patient presents with   Foot Pain    Pt is here due to bilateral foot pain that started 6 weeks ago, states it feels like the bones are collapsing, pain comes and goes and varies with location, states this happens only with movement.     HPI: 73 y.o. female presenting today for evaluation of bilateral midfoot pain.  Chronic onset.  No history of injury.  Past Medical History:  Diagnosis Date   Anxiety    Arthritis    Cataract    Complication of anesthesia    Woke up during colonoscopy and during Knee surgery. 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   Hyperlipidemia    Hypertension    Hyperthyroidism    s/p RAI therapy   Hypothyroidism    s/p ablation   Left atrial enlargement    severely enlarged   Migraine headache    2x/yr   Motion sickness    roller coasters   Obesity    Panic disorder    Persistent atrial fibrillation (HCC)    Snoring     Past Surgical History:  Procedure Laterality Date   ABDOMINAL HYSTERECTOMY     CARDIOVERSION N/A 09/11/2017   Procedure: CARDIOVERSION;  Surgeon: Raford Riggs, MD;  Location[facility] ENDOSCOPY;  Service: Cardiovascular;  Laterality: N/A;   CATARACT EXTRACTION     COLONOSCOPY WITH PROPOFOL  N/A 03/11/2019   Procedure: COLONOSCOPY WITH PROPOFOL ;  Surgeon: Therisa Bi, MD;  Location: [facility];  Service: Endoscopy;  Laterality: N/A;  colon   COLONOSCOPY WITH PROPOFOL  N/A 07/30/2022   Procedure: COLONOSCOPY WITH PROPOFOL ;  Surgeon: Toledo, Ladell POUR, MD;  Location: [facility] ENDOSCOPY;  Service: Gastroenterology;  Laterality: N/A;   KNEE SURGERY Bilateral    replacements   POLYPECTOMY  03/11/2019   Procedure: POLYPECTOMY;  Surgeon: Therisa Bi, MD;  Location: [facility];  Service: Endoscopy;;   THYROID  ABLATION  2010    Allergies  Allergen Reactions   Ativan  [Lorazepam ] Other (See Comments)     High sensitivity, cause pt to pass out   Valium [Diazepam] Other (See Comments)    Hallucinations, tremors, slurred speech   Meloxicam Other (See Comments)    Hallucinations   Trimethoprim Other (See Comments)   Sulfamethoxazole Rash   Sulfonamide Derivatives Rash     Physical Exam: General: The patient is alert and oriented x3 in no acute distress.  Dermatology: Skin is warm, dry and supple bilateral lower extremities.   Vascular: Palpable pedal pulses bilaterally. Capillary refill within normal limits.  No appreciable edema.  No erythema.  Neurological: Grossly intact via light touch  Musculoskeletal Exam: Tenderness with palpation noted to the midtarsal joint bilateral  Radiographic Exam B/L feet 11/03/2023:  Advanced severe degenerative changes with para-articular spurring noted throughout the midtarsal joints of the bilateral feet.  No acute fractures identified  Assessment/Plan of Care: 1.  Midtarsal arthritis bilateral feet  -Patient evaluated.  X-rays reviewed -Injection of 0.5 cc Celestone  Soluspan injected around the midtarsal joint bilateral -Patient on Eliquis .  No NSAIDs -Recommend OTC Tylenol  arthritis PRN -Recommend good supportive tennis shoes and sneakers and support the medial longitudinal arch of the foot -Return to clinic PRN       Thresa EMERSON Sar, DPM [facility]  Dr. Thresa EMERSON Sar, DPM    [address]  [place], [place] [address]                Office [phone]  Fax [phone]

## 2023-11-23 ENCOUNTER — Other Ambulatory Visit: Payer: Self-pay | Admitting: Internal Medicine

## 2023-11-24 ENCOUNTER — Other Ambulatory Visit

## 2023-11-24 DIAGNOSIS — R739 Hyperglycemia, unspecified: Secondary | ICD-10-CM | POA: Diagnosis not present

## 2023-11-24 DIAGNOSIS — E785 Hyperlipidemia, unspecified: Secondary | ICD-10-CM | POA: Diagnosis not present

## 2023-11-24 DIAGNOSIS — I10 Essential (primary) hypertension: Secondary | ICD-10-CM

## 2023-11-25 LAB — BASIC METABOLIC PANEL WITH GFR
BUN: 12 mg/dL (ref 6–23)
CO2: 27 meq/L (ref 19–32)
Calcium: 9.2 mg/dL (ref 8.4–10.5)
Chloride: 102 meq/L (ref 96–112)
Creatinine, Ser: 0.73 mg/dL (ref 0.40–1.20)
GFR: 81.16 mL/min (ref >60.00)
Glucose, Bld: 103 mg/dL — ABNORMAL HIGH (ref 70–99)
Potassium: 3.7 meq/L (ref 3.5–5.1)
Sodium: 141 meq/L (ref 135–145)

## 2023-11-25 LAB — HEPATIC FUNCTION PANEL
ALT: 12 U/L (ref 0–35)
AST: 12 U/L (ref 0–37)
Albumin: 4.3 g/dL (ref 3.5–5.2)
Alkaline Phosphatase: 56 U/L (ref 39–117)
Bilirubin, Direct: 0.2 mg/dL (ref 0.0–0.3)
Total Bilirubin: 1.4 mg/dL — ABNORMAL HIGH (ref 0.2–1.2)
Total Protein: 6.3 g/dL (ref 6.0–8.3)

## 2023-11-25 LAB — LIPID PANEL
Cholesterol: 177 mg/dL (ref 0–200)
HDL: 50.1 mg/dL (ref >39.00)
LDL Cholesterol: 94 mg/dL (ref 0–99)
NonHDL: 127.33
Total CHOL/HDL Ratio: 4
Triglycerides: 168 mg/dL — ABNORMAL HIGH (ref 0.0–149.0)
VLDL: 33.6 mg/dL (ref 0.0–40.0)

## 2023-11-25 LAB — HEMOGLOBIN A1C: Hgb A1c MFr Bld: 6.3 % (ref 4.6–6.5)

## 2023-11-26 ENCOUNTER — Ambulatory Visit: Admitting: Internal Medicine

## 2023-11-26 ENCOUNTER — Encounter: Payer: Self-pay | Admitting: Internal Medicine

## 2023-11-26 ENCOUNTER — Ambulatory Visit: Payer: Self-pay | Admitting: Internal Medicine

## 2023-11-26 VITALS — BP 122/78 | HR 79 | Temp 97.4°F | Ht 63.0 in | Wt 234.8 lb

## 2023-11-26 DIAGNOSIS — E039 Hypothyroidism, unspecified: Secondary | ICD-10-CM

## 2023-11-26 DIAGNOSIS — Z0000 Encounter for general adult medical examination without abnormal findings: Secondary | ICD-10-CM | POA: Diagnosis not present

## 2023-11-26 DIAGNOSIS — R739 Hyperglycemia, unspecified: Secondary | ICD-10-CM

## 2023-11-26 DIAGNOSIS — Z23 Encounter for immunization: Secondary | ICD-10-CM

## 2023-11-26 DIAGNOSIS — E785 Hyperlipidemia, unspecified: Secondary | ICD-10-CM | POA: Diagnosis not present

## 2023-11-26 DIAGNOSIS — D649 Anemia, unspecified: Secondary | ICD-10-CM

## 2023-11-26 DIAGNOSIS — D6869 Other thrombophilia: Secondary | ICD-10-CM

## 2023-11-26 DIAGNOSIS — I10 Essential (primary) hypertension: Secondary | ICD-10-CM

## 2023-11-26 DIAGNOSIS — K862 Cyst of pancreas: Secondary | ICD-10-CM

## 2023-11-26 DIAGNOSIS — M19079 Primary osteoarthritis, unspecified ankle and foot: Secondary | ICD-10-CM

## 2023-11-26 DIAGNOSIS — I482 Chronic atrial fibrillation, unspecified: Secondary | ICD-10-CM

## 2023-11-26 DIAGNOSIS — F419 Anxiety disorder, unspecified: Secondary | ICD-10-CM

## 2023-11-26 DIAGNOSIS — I7121 Aneurysm of the ascending aorta, without rupture: Secondary | ICD-10-CM | POA: Diagnosis not present

## 2023-11-26 NOTE — Assessment & Plan Note (Signed)
 The 10-year ASCVD risk score (Arnett DK, et al., 2019) is: 12.8%   Values used to calculate the score:     Age: 73 years     Clincally relevant sex: Female     Is Non-Hispanic African American: No     Diabetic: No     Tobacco smoker: No     Systolic Blood Pressure: 104 mmHg     Is BP treated: Yes     HDL Cholesterol: 50.1 mg/dL     Total Cholesterol: 177 mg/dL  Low cholesterol diet and exercise. Taking lipitor. Follow lipid panel. No changes in medication today.

## 2023-11-26 NOTE — Assessment & Plan Note (Signed)
 4.0 cm ascending aortic aneurysm noted on CT 05/25/23. Recommend annual imaging followup by CTA or MRA.

## 2023-11-26 NOTE — Progress Notes (Signed)
 Subjective:    Patient ID: Bonnie Shaw, female    DOB: [birth year], 73 y.o.   MRN: [id number]  Patient here for  Chief Complaint  Patient presents with   Annual Exam    HPI Here for a physical exam. Saw podiatry - 11/03/23 - midtarsal arthritis bilateral feet. S/p celestone  injection. Recommended tylenol  arthritis, support shoes. Saw ortho 08/26/23 - left ischial bursitis. S/p steroid injection and PT - helped. Saw pulmonary. Had cT scan 05/07/23 - no evidence of ILD. Had f/u with pulmonary 06/25/23 - f/u chronic cough that started in September with subsequent resolution, Workup included a HRCT that showed 3 mm nodules but no sign of ILD or any other parenchymal lung disease. Her double contrast esophagogram showed a hiatal hernia and signs of reflux. Pulmonary felt symptoms likely driven by reflux associated cough. Denies reflux. States will feel has lump in throat. Recommended management with dietary modifications. Incidentally found - 4cm ascending aortic aneurysm. Recommended yearly radiographic surveillance. Discussed. Continues on zoloft . Handling stress. Had two falls recently. Denies significant injury. Breathing stable. No chest pain reported.     Past Medical History:  Diagnosis Date   Anxiety    Arthritis    Cataract    Complication of anesthesia    Woke up during colonoscopy and during Knee surgery. 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   Hyperlipidemia    Hypertension    Hyperthyroidism    s/p RAI therapy   Hypothyroidism    s/p ablation   Left atrial enlargement    severely enlarged   Migraine headache    2x/yr   Motion sickness    roller coasters   Obesity    Panic disorder    Persistent atrial fibrillation (HCC)    Snoring    Past Surgical History:  Procedure Laterality Date   ABDOMINAL HYSTERECTOMY     CARDIOVERSION N/A 09/11/2017   Procedure:  CARDIOVERSION;  Surgeon: Raford Riggs, MD;  Location: [facility] ENDOSCOPY;  Service: Cardiovascular;  Laterality: N/A;   CATARACT EXTRACTION     COLONOSCOPY WITH PROPOFOL  N/A 03/11/2019   Procedure: COLONOSCOPY WITH PROPOFOL ;  Surgeon: Therisa Bi, MD;  Location: [facility];  Service: Endoscopy;  Laterality: N/A;  colon   COLONOSCOPY WITH PROPOFOL  N/A 07/30/2022   Procedure: COLONOSCOPY WITH PROPOFOL ;  Surgeon: Toledo, Ladell POUR, MD;  Location: [facility] ENDOSCOPY;  Service: Gastroenterology;  Laterality: N/A;   KNEE SURGERY Bilateral    replacements   POLYPECTOMY  03/11/2019   Procedure: POLYPECTOMY;  Surgeon: Therisa Bi, MD;  Location: [facility]GERY [facility];  Service: Endoscopy;;   THYROID  ABLATION  2010   Family History  Problem Relation Age of Onset   Alzheimer's disease Father    Stroke Mother    Cancer Sister        ovarian    Ovarian cancer Sister 49   Breast cancer Neg Hx    Social History   Socioeconomic History   Marital status: Married    Spouse name: Not on file   Number of children: 4   Years of education: Not on file   Highest education level: Associate degree: occupational, scientist, product/process development, or vocational program  Occupational History    Employer: UNEMPLOYED  Tobacco Use   Smoking status: Never   Smokeless tobacco: Never  Vaping Use   Vaping status: Never Used  Substance and Sexual  Activity   Alcohol use: No   Drug use: No   Sexual activity: Not on file  Other Topics Concern   Not on file  Social History Narrative   Lives in [place] with husband, has 4 children and 15 grandkids         Attends [employer], 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    Writes as a tefl teacher for the [employer]   Retired [employer]   Social Drivers of Health   Financial Resource Strain: Patient Declined (11/23/2023)   Overall Financial Resource Strain (CARDIA)    Difficulty of Paying Living Expenses: Patient declined  Food Insecurity: Patient Declined (11/23/2023)   Hunger Vital Sign     Worried About Running Out of Food in the Last Year: Patient declined    Ran Out of Food in the Last Year: Patient declined  Transportation Needs: Patient Declined (11/23/2023)   PRAPARE - Administrator, Civil Service (Medical): Patient declined    Lack of Transportation (Non-Medical): Patient declined  Physical Activity: Sufficiently Active (06/09/2023)   Exercise Vital Sign    Days of Exercise per Week: 3 days    Minutes of Exercise per Session: 50 min  Stress: Patient Declined (11/23/2023)   [employer] of Occupational Health - Occupational Stress Questionnaire    Feeling of Stress: Patient declined  Social Connections: Unknown (11/23/2023)   Social Connection and Isolation Panel    Frequency of Communication with Friends and Family: Patient declined    Frequency of Social Gatherings with Friends and Family: Patient declined    Attends Religious Services: Patient declined    Database Administrator or Organizations: Not on file    Attends Banker Meetings: Not on file    Marital Status: Patient declined     Review of Systems  Constitutional:  Negative for appetite change and unexpected weight change.  HENT:  Negative for congestion, sinus pressure and sore throat.   Eyes:  Negative for pain and visual disturbance.  Respiratory:  Negative for cough and chest tightness.        Breathing stable.   Cardiovascular:  Negative for chest pain, palpitations and leg swelling.  Gastrointestinal:  Negative for abdominal pain, diarrhea, nausea and vomiting.  Genitourinary:  Negative for difficulty urinating and dysuria.  Musculoskeletal:  Negative for joint swelling and myalgias.  Skin:  Negative for color change and rash.  Neurological:  Negative for dizziness and headaches.  Hematological:  Negative for adenopathy. Does not bruise/bleed easily.  Psychiatric/Behavioral:  Negative for agitation and dysphoric mood.        Objective:     BP 122/78   Pulse  79   Temp (!) 97.4 F (36.3 C) (Oral)   Ht 5' 3 (1.6 m)   Wt 234 lb 12.8 oz (106.5 kg)   SpO2 96%   BMI 41.59 kg/m  Wt Readings from Last 3 Encounters:  11/26/23 234 lb 12.8 oz (106.5 kg)  11/03/23 228 lb (103.4 kg)  06/25/23 228 lb (103.4 kg)    Physical Exam Vitals reviewed.  Constitutional:      General: She is not in acute distress.    Appearance: Normal appearance. She is well-developed.  HENT:     Head: Normocephalic and atraumatic.     Right Ear: External ear normal.     Left Ear: External ear normal.     Mouth/Throat:     Pharynx: No oropharyngeal exudate or posterior oropharyngeal erythema.  Eyes:  General: No scleral icterus.       Right eye: No discharge.        Left eye: No discharge.     Conjunctiva/sclera: Conjunctivae normal.  Neck:     Thyroid : No thyromegaly.  Cardiovascular:     Rate and Rhythm: Normal rate and regular rhythm.  Pulmonary:     Effort: No tachypnea, accessory muscle usage or respiratory distress.     Breath sounds: Normal breath sounds. No decreased breath sounds, wheezing or rhonchi.  Chest:  Breasts:    Right: No inverted nipple, mass, nipple discharge or tenderness (no axillary adenopathy).     Left: No inverted nipple, mass, nipple discharge or tenderness (no axilarry adenopathy).  Abdominal:     General: Bowel sounds are normal.     Palpations: Abdomen is soft.     Tenderness: There is no abdominal tenderness.  Musculoskeletal:        General: No swelling or tenderness.     Cervical back: Neck supple.  Lymphadenopathy:     Cervical: No cervical adenopathy.  Skin:    General: Skin is warm.     Findings: No erythema or rash.  Neurological:     Mental Status: She is alert and oriented to person, place, and time.  Psychiatric:        Mood and Affect: Mood normal.        Behavior: Behavior normal.         Outpatient Encounter Medications as of 11/26/2023  Medication Sig   ALPRAZolam  (XANAX ) 0.5 MG tablet Take 1  tablet (0.5 mg total) by mouth once as needed for up to 1 dose (for anxiety associated with undegoing CT scan of the chest.).   apixaban  (ELIQUIS ) 5 MG TABS tablet Take 1 tablet (5 mg total) by mouth 2 (two) times daily.   atorvastatin  (LIPITOR) 20 MG tablet Take 1 tablet (20 mg total) by mouth daily.   cholecalciferol  (VITAMIN D3) 25 MCG (1000 UT) tablet Take 1,000 Units by mouth daily. Taking 2,000mg  daily   Cyanocobalamin  (VITAMIN B 12 PO) Take by mouth daily.   hydrochlorothiazide  (HYDRODIURIL ) 25 MG tablet Take 1 tablet (25 mg total) by mouth daily.   levothyroxine  (SYNTHROID ) 50 MCG tablet Take 1 tablet (50 mcg total) by mouth daily.   metoprolol  tartrate (LOPRESSOR ) 25 MG tablet Take 1 tablet (25 mg total) by mouth 2 (two) times daily.   potassium chloride  (KLOR-CON ) 10 MEQ tablet TAKE 1 TABLET BY MOUTH TWICE  DAILY   sertraline  (ZOLOFT ) 50 MG tablet TAKE 1 TABLET BY MOUTH  DAILY   diclofenac  Sodium (VOLTAREN ) 1 % GEL Apply 2 g topically 4 (four) times daily. (Patient not taking: Reported on 11/26/2023)   No facility-administered encounter medications on file as of 11/26/2023.     Lab Results  Component Value Date   WBC 7.4 01/13/2023   HGB 14.3 01/13/2023   HCT 42.3 01/13/2023   PLT 252.0 01/13/2023   GLUCOSE 103 (H) 11/24/2023   CHOL 177 11/24/2023   TRIG 168.0 (H) 11/24/2023   HDL 50.10 11/24/2023   LDLDIRECT 97.0 11/01/2018   LDLCALC 94 11/24/2023   ALT 12 11/24/2023   AST 12 11/24/2023   NA 141 11/24/2023   K 3.7 11/24/2023   CL 102 11/24/2023   CREATININE 0.73 11/24/2023   BUN 12 11/24/2023   CO2 27 11/24/2023   TSH 2.04 02/19/2023   INR 1.3 (H) 12/19/2022   HGBA1C 6.3 11/24/2023    DG Ribs Unilateral W/Chest Right  Result Date: 08/12/2023 CLINICAL DATA:  Right-sided rib pain after fall 8 days ago. EXAM: RIGHT RIBS AND CHEST - 3+ VIEW COMPARISON:  06/09/2023. FINDINGS: No acute osseous abnormality is identified. No obvious displaced rib fracture. Redemonstrated  scoliotic curvature of the thoracolumbar spine. There is no evidence of pneumothorax or pleural effusion. Both lungs are clear. Heart size and mediastinal contours are unchanged. IMPRESSION: 1. No obvious displaced rib fracture. If there is persistent clinical concern for occult rib fracture a CT of the chest could be obtained for further evaluation. 2. No acute cardiopulmonary findings. Electronically Signed   By: Harrietta Sherry M.D.   On: 08/12/2023 13:35       Assessment & Plan:  Healthcare maintenance Assessment & Plan: Physical today 11/26/23.  Mammogram 04/14/23 - Birads I. Colonoscopy 07/30/22  - internal hemorrhoids.    Hyperlipidemia, unspecified hyperlipidemia type Assessment & Plan: The 10-year ASCVD risk score (Arnett DK, et al., 2019) is: 12.8%   Values used to calculate the score:     Age: 31 years     Clincally relevant sex: Female     Is Non-Hispanic African American: No     Diabetic: No     Tobacco smoker: No     Systolic Blood Pressure: 104 mmHg     Is BP treated: Yes     HDL Cholesterol: 50.1 mg/dL     Total Cholesterol: 177 mg/dL  Low cholesterol diet and exercise. Taking lipitor. Follow lipid panel. No changes in medication today.    Primary hypertension Assessment & Plan: Blood pressure as outlined.  On hydrochlorothiazide  and metoprolol . Follow pressures. Follow metabolic panel. No changes in medication today.    Aneurysm of ascending aorta without rupture Assessment & Plan: 4.0 cm ascending aortic aneurysm noted on CT 05/25/23. Recommend annual imaging followup by CTA or MRA.   Needs flu shot -     Flu vaccine HIGH DOSE PF(Fluzone Trivalent)  Pancreatic cyst Assessment & Plan: Scheduled f/u MRI abdomen and MRCP - f/u pancreatic cyst. F/u MRI - no change in cyst.  Recommended 2 year f/u MRI.  Last MRI 05/2022.    Hypothyroidism, unspecified type Assessment & Plan: On thyroid  replacement. Follow tsh.    Hyperglycemia Assessment & Plan: Low carb  diet and exercise. Follow met b and A1c.  Lab Results  Component Value Date   HGBA1C 6.3 11/24/2023      Chronic atrial fibrillation with RVR (HCC) Assessment & Plan: Continue eliquis  and metoprolol . Stable. Continue f/u with cardiology.    Arthritis of midtarsal joint Assessment & Plan:  Saw podiatry - 11/03/23 - midtarsal arthritis bilateral feet. S/p celestone  injection. Recommended tylenol  arthritis, support shoes.    Anxiety Assessment & Plan: Continue zoloft . Appears to be handling things well. Follow.    Anemia, unspecified type Assessment & Plan: Follow cbc.    Acquired thrombophilia Assessment & Plan: Afib. On eliquis .       Allena Hamilton, MD

## 2023-11-26 NOTE — Assessment & Plan Note (Signed)
 Physical today 11/26/23.  Mammogram 04/14/23 - Birads I. Colonoscopy 07/30/22  - internal hemorrhoids.

## 2023-11-26 NOTE — Assessment & Plan Note (Signed)
 Blood pressure as outlined.  On hydrochlorothiazide  and metoprolol . Follow pressures. Follow metabolic panel. No changes in medication today.

## 2023-11-29 ENCOUNTER — Encounter: Payer: Self-pay | Admitting: Internal Medicine

## 2023-11-29 NOTE — Assessment & Plan Note (Signed)
 On thyroid replacement.  Follow tsh.

## 2023-11-29 NOTE — Progress Notes (Incomplete)
 Subjective:    Patient ID: Bonnie Shaw, female    DOB: 1950-10-18, 73 y.o.   MRN: [id number]  Patient here for  Chief Complaint  Patient presents with  . Annual Exam    HPI Here for a physical exam. Saw podiatry - 11/03/23 - midtarsal arthritis bilateral feet. S/p celestone  injection. Recommended tylenol  arthritis, support shoes. Saw ortho 08/26/23 - left ischial bursitis. S/p steroid injection and PT - helped. Saw pulmonary. Had cT scan 05/07/23 - no evidence of ILD. Had f/u with pulmonary 06/25/23 - f/u chronic cough that started in September with subsequent resolution, Workup included a HRCT that showed 3 mm nodules but no sign of ILD or any other parenchymal lung disease. Her double contrast esophagogram showed a hiatal hernia and signs of reflux. Pulmonary felt symptoms likely driven by reflux associated cough. Denies reflux. States will feel has lump in throat. Recommended management with dietary modifications. Incidentally found - 4cm ascending aortic aneurysm. Recommended yearly radiographic surveillance. Discussed. Continues on zoloft . Handling stress. Had two falls recently. Denies significant injury. Breathing stable. No chest pain reported.     Past Medical History:  Diagnosis Date  . Anxiety   . Arthritis   . Cataract   . Complication of anesthesia    Woke up during colonoscopy and during Knee surgery. Also says nitrous makes me crazy  . Family history of adverse reaction to anesthesia    son woke up during TEE  . 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 Hyperlipidemia   . Hypertension   . Hyperthyroidism    s/p RAI therapy  . Hypothyroidism    s/p ablation  . Left atrial enlargement    severely enlarged  . Migraine headache    2x/yr  . Motion sickness    roller coasters  . Obesity   . Panic disorder   . Persistent atrial fibrillation (HCC)   . Snoring    Past Surgical History:  Procedure Laterality Date  . ABDOMINAL HYSTERECTOMY    . CARDIOVERSION N/A 09/11/2017    Procedure: CARDIOVERSION;  Surgeon: Raford Riggs, MD;  Location: [facility] ENDOSCOPY;  Service: Cardiovascular;  Laterality: N/A;  . CATARACT EXTRACTION    . COLONOSCOPY WITH PROPOFOL  N/A 03/11/2019   Procedure: COLONOSCOPY WITH PROPOFOL ;  Surgeon: Therisa Bi, MD;  Location: [facility];  Service: Endoscopy;  Laterality: N/A;  colon  . COLONOSCOPY WITH PROPOFOL  N/A 07/30/2022   Procedure: COLONOSCOPY WITH PROPOFOL ;  Surgeon: Toledo, Ladell POUR, MD;  Location: [facility] ENDOSCOPY;  Service: Gastroenterology;  Laterality: N/A;  . KNEE SURGERY Bilateral    replacements  . POLYPECTOMY  03/11/2019   Procedure: POLYPECTOMY;  Surgeon: Therisa Bi, MD;  [facility];  Service: Endoscopy;;  . THYROID  ABLATION  2010   Family History  Problem Relation Age of Onset  . Alzheimer's disease Father   . Stroke Mother   . Cancer Sister        ovarian   . Ovarian cancer Sister 30  . Breast cancer Neg Hx    Social History   Socioeconomic History  . Marital status: Married    Spouse name: Not on file  . Number of children: 4  . Years of education: Not on file  . Highest education level: Associate degree: occupational, scientist, product/process development, or vocational program  Occupational History    Employer: UNEMPLOYED  Tobacco Use  . Smoking status: Never  . Smokeless tobacco: Never  Vaping Use  . Vaping status: Never Used  Substance and Sexual  Activity  . Alcohol use: No  . Drug use: No  . Sexual activity: Not on file  Other Topics Concern  . 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 Difficulty of Paying Living Expenses: Patient declined  Food Insecurity: Patient Declined  (11/23/2023)   Hunger Vital Sign   . Worried About Programme Researcher, Broadcasting/film/video in the Last Year: Patient declined   . Ran Out of Food in the Last Year: Patient declined  Transportation Needs: Patient Declined (11/23/2023)   PRAPARE - Transportation   . Lack of Transportation (Medical): Patient declined   . Lack of Transportation (Non-Medical): Patient declined  Physical Activity: Sufficiently Active (06/09/2023)   Exercise Vital Sign   . Days of Exercise per Week: 3 days   . Minutes of Exercise per Session: 50 min  Stress: Patient Declined (11/23/2023)   [employer] of Occupational Health - Occupational Stress Questionnaire   . Feeling of Stress: Patient declined  Social Connections: Unknown (11/23/2023)   Social Connection and Isolation Panel   . Frequency of Communication with Friends and Family: Patient declined   . Frequency of Social Gatherings with Friends and Family: Patient declined   . Attends Religious Services: Patient declined   . Active Member of Clubs or Organizations: Not on file   . Attends Banker Meetings: Not on file   . Marital Status: Patient declined     Review of Systems  Constitutional:  Negative for appetite change and unexpected weight change.  HENT:  Negative for congestion, sinus pressure and sore throat.   Eyes:  Negative for pain and visual disturbance.  Respiratory:  Negative for cough and chest tightness.        Breathing stable.   Cardiovascular:  Negative for chest pain, palpitations and leg swelling.  Gastrointestinal:  Negative for abdominal pain, diarrhea, nausea and vomiting.  Genitourinary:  Negative for difficulty urinating and dysuria.  Musculoskeletal:  Negative for joint swelling and myalgias.  Skin:  Negative for color change and rash.  Neurological:  Negative for dizziness and headaches.  Hematological:  Negative for adenopathy. Does not bruise/bleed easily.  Psychiatric/Behavioral:  Negative for agitation and dysphoric  mood.        Objective:     BP 122/78   Pulse 79   Temp (!) 97.4 F (36.3 C) (Oral)   Ht 5' 3 (1.6 m)   Wt 234 lb 12.8 oz (106.5 kg)   SpO2 96%   BMI 41.59 kg/m  Wt Readings from Last 3 Encounters:  11/26/23 234 lb 12.8 oz (106.5 kg)  11/03/23 228 lb (103.4 kg)  06/25/23 228 lb (103.4 kg)    Physical Exam Vitals reviewed.  Constitutional:      General: She is not in acute distress.    Appearance: Normal appearance. She is well-developed.  HENT:     Head: Normocephalic and atraumatic.     Right Ear: External ear normal.     Left Ear: External ear normal.     Mouth/Throat:     Pharynx: No oropharyngeal exudate or posterior oropharyngeal erythema.  Eyes:  General: No scleral icterus.       Right eye: No discharge.        Left eye: No discharge.     Conjunctiva/sclera: Conjunctivae normal.  Neck:     Thyroid : No thyromegaly.  Cardiovascular:     Rate and Rhythm: Normal rate and regular rhythm.  Pulmonary:     Effort: No tachypnea, accessory muscle usage or respiratory distress.     Breath sounds: Normal breath sounds. No decreased breath sounds, wheezing or rhonchi.  Chest:  Breasts:    Right: No inverted nipple, mass, nipple discharge or tenderness (no axillary adenopathy).     Left: No inverted nipple, mass, nipple discharge or tenderness (no axilarry adenopathy).  Abdominal:     General: Bowel sounds are normal.     Palpations: Abdomen is soft.     Tenderness: There is no abdominal tenderness.  Musculoskeletal:        General: No swelling or tenderness.     Cervical back: Neck supple.  Lymphadenopathy:     Cervical: No cervical adenopathy.  Skin:    General: Skin is warm.     Findings: No erythema or rash.  Neurological:     Mental Status: She is alert and oriented to person, place, and time.  Psychiatric:        Mood and Affect: Mood normal.        Behavior: Behavior normal.     {Perform Simple Foot Exam  Perform Detailed exam:1} {Insert foot  Exam (Optional):30965}   Outpatient Encounter Medications as of 11/26/2023  Medication Sig  . ALPRAZolam  (XANAX ) 0.5 MG tablet Take 1 tablet (0.5 mg total) by mouth once as needed for up to 1 dose (for anxiety associated with undegoing CT scan of the chest.).  . apixaban  (ELIQUIS ) 5 MG TABS tablet Take 1 tablet (5 mg total) by mouth 2 (two) times daily.  . atorvastatin  (LIPITOR) 20 MG tablet Take 1 tablet (20 mg total) by mouth daily.  . cholecalciferol  (VITAMIN D3) 25 MCG (1000 UT) tablet Take 1,000 Units by mouth daily. Taking 2,000mg  daily  . Cyanocobalamin  (VITAMIN B 12 PO) Take by mouth daily.  . hydrochlorothiazide  (HYDRODIURIL ) 25 MG tablet Take 1 tablet (25 mg total) by mouth daily.  . levothyroxine  (SYNTHROID ) 50 MCG tablet Take 1 tablet (50 mcg total) by mouth daily.  . metoprolol  tartrate (LOPRESSOR ) 25 MG tablet Take 1 tablet (25 mg total) by mouth 2 (two) times daily.  . potassium chloride  (KLOR-CON ) 10 MEQ tablet TAKE 1 TABLET BY MOUTH TWICE  DAILY  . sertraline  (ZOLOFT ) 50 MG tablet TAKE 1 TABLET BY MOUTH  DAILY  . diclofenac  Sodium (VOLTAREN ) 1 % GEL Apply 2 g topically 4 (four) times daily. (Patient not taking: Reported on 11/26/2023)   No facility-administered encounter medications on file as of 11/26/2023.     Lab Results  Component Value Date   WBC 7.4 01/13/2023   HGB 14.3 01/13/2023   HCT 42.3 01/13/2023   PLT 252.0 01/13/2023   GLUCOSE 103 (H) 11/24/2023   CHOL 177 11/24/2023   TRIG 168.0 (H) 11/24/2023   HDL 50.10 11/24/2023   LDLDIRECT 97.0 11/01/2018   LDLCALC 94 11/24/2023   ALT 12 11/24/2023   AST 12 11/24/2023   NA 141 11/24/2023   K 3.7 11/24/2023   CL 102 11/24/2023   CREATININE 0.73 11/24/2023   BUN 12 11/24/2023   CO2 27 11/24/2023   TSH 2.04 02/19/2023   INR 1.3 (H) 12/19/2022   HGBA1C  6.3 11/24/2023    DG Ribs Unilateral W/Chest Right Result Date: 08/12/2023 CLINICAL DATA:  Right-sided rib pain after fall 8 days ago. EXAM: RIGHT RIBS AND  CHEST - 3+ VIEW COMPARISON:  06/09/2023. FINDINGS: No acute osseous abnormality is identified. No obvious displaced rib fracture. Redemonstrated scoliotic curvature of the thoracolumbar spine. There is no evidence of pneumothorax or pleural effusion. Both lungs are clear. Heart size and mediastinal contours are unchanged. IMPRESSION: 1. No obvious displaced rib fracture. If there is persistent clinical concern for occult rib fracture a CT of the chest could be obtained for further evaluation. 2. No acute cardiopulmonary findings. Electronically Signed   By: Harrietta Sherry M.D.   On: 08/12/2023 13:35       Assessment & Plan:  Healthcare maintenance Assessment & Plan: Physical today 11/26/23.  Mammogram 04/14/23 - Birads I. Colonoscopy 07/30/22  - internal hemorrhoids.    Hyperlipidemia, unspecified hyperlipidemia type Assessment & Plan: The 10-year ASCVD risk score (Arnett DK, et al., 2019) is: 12.8%   Values used to calculate the score:     Age: 25 years     Clincally relevant sex: Female     Is Non-Hispanic African American: No     Diabetic: No     Tobacco smoker: No     Systolic Blood Pressure: 104 mmHg     Is BP treated: Yes     HDL Cholesterol: 50.1 mg/dL     Total Cholesterol: 177 mg/dL  Low cholesterol diet and exercise. Taking lipitor. Follow lipid panel. No changes in medication today.    Primary hypertension Assessment & Plan: Blood pressure as outlined.  On hydrochlorothiazide  and metoprolol . Follow pressures. Follow metabolic panel. No changes in medication today.    Aneurysm of ascending aorta without rupture Assessment & Plan: 4.0 cm ascending aortic aneurysm noted on CT 05/25/23. Recommend annual imaging followup by CTA or MRA.   Needs flu shot -     Flu vaccine HIGH DOSE PF(Fluzone Trivalent)     Allena Hamilton, MD

## 2023-11-29 NOTE — Assessment & Plan Note (Signed)
 Scheduled f/u MRI abdomen and MRCP - f/u pancreatic cyst. F/u MRI - no change in cyst.  Recommended 2 year f/u MRI.  Last MRI 05/2022.

## 2023-11-30 NOTE — Assessment & Plan Note (Signed)
 Continue eliquis  and metoprolol . Stable. Continue f/u with cardiology.

## 2023-11-30 NOTE — Assessment & Plan Note (Signed)
 Follow cbc.

## 2023-11-30 NOTE — Assessment & Plan Note (Signed)
 Saw podiatry - 11/03/23 - midtarsal arthritis bilateral feet. S/p celestone  injection. Recommended tylenol  arthritis, support shoes.

## 2023-11-30 NOTE — Assessment & Plan Note (Signed)
Afib.  On eliquis.  

## 2023-11-30 NOTE — Assessment & Plan Note (Signed)
Continue zoloft.  Appears to be handling things well.  Follow.  

## 2023-11-30 NOTE — Assessment & Plan Note (Signed)
 Low carb diet and exercise. Follow met b and A1c.  Lab Results  Component Value Date   HGBA1C 6.3 11/24/2023

## 2023-12-15 ENCOUNTER — Telehealth: Payer: Self-pay | Admitting: Internal Medicine

## 2023-12-15 ENCOUNTER — Other Ambulatory Visit: Payer: Self-pay

## 2023-12-15 MED ORDER — POTASSIUM CHLORIDE ER 10 MEQ PO TBCR: 10.0000 meq | EXTENDED_RELEASE_TABLET | Freq: Two times a day (BID) | ORAL | 1 refills | Status: AC

## 2023-12-15 NOTE — Telephone Encounter (Signed)
 Pt stopped in today. She states no one in our office has contacted her daughter Elida Coe [phone]  to schedule a new pt visit. Pt says Dr Glendia has told her several times over the past year when she has inquired about this at her own visits, that she would take on daughter as a pt and has written it down each time, we could not find any notes in either chart stating to call daughter to make an appt. Please reach out to daughter or to Darice in the front office to schedule if she is indeed being accepted as a new pt.

## 2023-12-15 NOTE — Telephone Encounter (Signed)
 Thank you :)

## 2023-12-23 ENCOUNTER — Encounter: Payer: Self-pay | Admitting: Internal Medicine

## 2023-12-23 DIAGNOSIS — M25552 Pain in left hip: Secondary | ICD-10-CM | POA: Insufficient documentation

## 2024-03-01 ENCOUNTER — Ambulatory Visit (INDEPENDENT_AMBULATORY_CARE_PROVIDER_SITE_OTHER): Admitting: Podiatry

## 2024-03-01 DIAGNOSIS — M19071 Primary osteoarthritis, right ankle and foot: Secondary | ICD-10-CM

## 2024-03-01 DIAGNOSIS — M19072 Primary osteoarthritis, left ankle and foot: Secondary | ICD-10-CM | POA: Diagnosis not present

## 2024-03-01 MED ORDER — BETAMETHASONE SOD PHOS & ACET 6 (3-3) MG/ML IJ SUSP
3.0000 mg | Freq: Once | INTRAMUSCULAR | Status: AC
  Administered 2024-03-01: 3 mg via INTRA_ARTICULAR

## 2024-03-01 NOTE — Progress Notes (Signed)
 "  Chief Complaint  Patient presents with   Foot Pain    Pt is here due to bilateral foot pain and is requesting injection into both feet.    HPI: 74 y.o. female presenting today for evaluation of bilateral midfoot arthritis.  Injections in the past have helped significantly  Past Medical History:  Diagnosis Date   Anxiety    Arthritis    Cataract    Complication of anesthesia    Woke up during colonoscopy and during Knee surgery. 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   Hyperlipidemia    Hypertension    Hyperthyroidism    s/p RAI therapy   Hypothyroidism    s/p ablation   Left atrial enlargement    severely enlarged   Migraine headache    2x/yr   Motion sickness    roller coasters   Obesity    Panic disorder    Persistent atrial fibrillation (HCC)    Snoring     Past Surgical History:  Procedure Laterality Date   ABDOMINAL HYSTERECTOMY     CARDIOVERSION N/A 09/11/2017   Procedure: CARDIOVERSION;  Surgeon: Raford Riggs, MD;  Location: [facility] ENDOSCOPY;  Service: Cardiovascular;  Laterality: N/A;   CATARACT EXTRACTION     COLONOSCOPY WITH PROPOFOL  N/A 03/11/2019   Procedure: COLONOSCOPY WITH PROPOFOL ;  Surgeon: Therisa Bi, MD;  Location: [facility];  Service: Endoscopy;  Laterality: N/A;  colon   COLONOSCOPY WITH PROPOFOL  N/A 07/30/2022   Procedure: COLONOSCOPY WITH PROPOFOL ;  Surgeon: Toledo, Ladell POUR, MD;  Location: [facility] ENDOSCOPY;  Service: Gastroenterology;  Laterality: N/A;   KNEE SURGERY Bilateral    replacements   POLYPECTOMY  03/11/2019   Procedure: POLYPECTOMY;  Surgeon: Therisa Bi, MD;  Location: [facility];  Service: Endoscopy;;   THYROID  ABLATION  2010    Allergies  Allergen Reactions   Ativan  [Lorazepam ] Other (See Comments)    High sensitivity, cause pt to pass out   Valium [Diazepam] Other (See Comments)    Hallucinations,  tremors, slurred speech   Meloxicam Other (See Comments)    Hallucinations   Trimethoprim Other (See Comments)   Sulfamethoxazole Rash   Sulfonamide Derivatives Rash     Physical Exam: General: The patient is alert and oriented x3 in no acute distress.  Dermatology: Skin is warm, dry and supple bilateral lower extremities.   Vascular: Palpable pedal pulses bilaterally. Capillary refill within normal limits.  No appreciable edema.  No erythema.  Neurological: Grossly intact via light touch  Musculoskeletal Exam: Chronic tenderness with palpation noted to the midtarsal joint bilateral  Radiographic Exam B/L feet 11/03/2023:  Advanced severe degenerative changes with para-articular spurring noted throughout the midtarsal joints of the bilateral feet.  No acute fractures identified  Assessment/Plan of Care: 1.  Midtarsal arthritis bilateral feet  -Patient evaluated.   -Injection of 0.5 cc Celestone  Soluspan injected around the midtarsal joint bilateral -Patient on Eliquis .  No NSAIDs -Recommend OTC Tylenol  arthritis PRN -Recommend good supportive tennis shoes and sneakers and support the medial longitudinal arch of the foot -Return to clinic PRN       Thresa EMERSON Sar, DPM [facility]  Dr. Thresa EMERSON Sar, DPM    [address]  [place], [place] [address]                Office [phone]  Fax [phone]     "

## 2024-03-30 ENCOUNTER — Other Ambulatory Visit

## 2024-04-01 ENCOUNTER — Ambulatory Visit: Admitting: Internal Medicine

## 2024-06-13 ENCOUNTER — Ambulatory Visit
